# Patient Record
Sex: Female | Born: 1955 | Race: Black or African American | Hispanic: No | Marital: Married | Smoking: Never smoker
Health system: Southern US, Community
[De-identification: ages and names within clinical notes are randomized; demographics above are authoritative.]

## PROBLEM LIST (undated history)

## (undated) DIAGNOSIS — E669 Obesity, unspecified: Secondary | ICD-10-CM

## (undated) DIAGNOSIS — Z8719 Personal history of other diseases of the digestive system: Secondary | ICD-10-CM

## (undated) DIAGNOSIS — M545 Low back pain, unspecified: Secondary | ICD-10-CM

## (undated) DIAGNOSIS — J189 Pneumonia, unspecified organism: Secondary | ICD-10-CM

## (undated) DIAGNOSIS — F319 Bipolar disorder, unspecified: Secondary | ICD-10-CM

## (undated) DIAGNOSIS — D649 Anemia, unspecified: Secondary | ICD-10-CM

## (undated) DIAGNOSIS — K222 Esophageal obstruction: Secondary | ICD-10-CM

## (undated) DIAGNOSIS — M109 Gout, unspecified: Secondary | ICD-10-CM

## (undated) DIAGNOSIS — G479 Sleep disorder, unspecified: Secondary | ICD-10-CM

## (undated) DIAGNOSIS — M797 Fibromyalgia: Secondary | ICD-10-CM

## (undated) DIAGNOSIS — G8929 Other chronic pain: Secondary | ICD-10-CM

## (undated) DIAGNOSIS — I219 Acute myocardial infarction, unspecified: Secondary | ICD-10-CM

## (undated) DIAGNOSIS — J42 Unspecified chronic bronchitis: Secondary | ICD-10-CM

## (undated) DIAGNOSIS — E119 Type 2 diabetes mellitus without complications: Secondary | ICD-10-CM

## (undated) DIAGNOSIS — K589 Irritable bowel syndrome without diarrhea: Secondary | ICD-10-CM

## (undated) DIAGNOSIS — I82409 Acute embolism and thrombosis of unspecified deep veins of unspecified lower extremity: Secondary | ICD-10-CM

## (undated) DIAGNOSIS — Z9989 Dependence on other enabling machines and devices: Secondary | ICD-10-CM

## (undated) DIAGNOSIS — K635 Polyp of colon: Secondary | ICD-10-CM

## (undated) DIAGNOSIS — R519 Headache, unspecified: Secondary | ICD-10-CM

## (undated) DIAGNOSIS — K5790 Diverticulosis of intestine, part unspecified, without perforation or abscess without bleeding: Secondary | ICD-10-CM

## (undated) DIAGNOSIS — R51 Headache: Secondary | ICD-10-CM

## (undated) DIAGNOSIS — K029 Dental caries, unspecified: Secondary | ICD-10-CM

## (undated) DIAGNOSIS — T8859XA Other complications of anesthesia, initial encounter: Secondary | ICD-10-CM

## (undated) DIAGNOSIS — I209 Angina pectoris, unspecified: Secondary | ICD-10-CM

## (undated) DIAGNOSIS — R112 Nausea with vomiting, unspecified: Secondary | ICD-10-CM

## (undated) DIAGNOSIS — F419 Anxiety disorder, unspecified: Secondary | ICD-10-CM

## (undated) DIAGNOSIS — D573 Sickle-cell trait: Secondary | ICD-10-CM

## (undated) DIAGNOSIS — R718 Other abnormality of red blood cells: Secondary | ICD-10-CM

## (undated) DIAGNOSIS — I10 Essential (primary) hypertension: Secondary | ICD-10-CM

## (undated) DIAGNOSIS — F329 Major depressive disorder, single episode, unspecified: Secondary | ICD-10-CM

## (undated) DIAGNOSIS — Z9889 Other specified postprocedural states: Secondary | ICD-10-CM

## (undated) DIAGNOSIS — G4733 Obstructive sleep apnea (adult) (pediatric): Secondary | ICD-10-CM

## (undated) DIAGNOSIS — T4145XA Adverse effect of unspecified anesthetic, initial encounter: Secondary | ICD-10-CM

## (undated) DIAGNOSIS — M1990 Unspecified osteoarthritis, unspecified site: Secondary | ICD-10-CM

## (undated) DIAGNOSIS — D759 Disease of blood and blood-forming organs, unspecified: Secondary | ICD-10-CM

## (undated) DIAGNOSIS — E785 Hyperlipidemia, unspecified: Secondary | ICD-10-CM

## (undated) DIAGNOSIS — G971 Other reaction to spinal and lumbar puncture: Secondary | ICD-10-CM

## (undated) DIAGNOSIS — K219 Gastro-esophageal reflux disease without esophagitis: Secondary | ICD-10-CM

## (undated) DIAGNOSIS — R0600 Dyspnea, unspecified: Secondary | ICD-10-CM

## (undated) HISTORY — DX: Gastro-esophageal reflux disease without esophagitis: K21.9

## (undated) HISTORY — PX: ABDOMINAL HYSTERECTOMY: SHX81

## (undated) HISTORY — PX: TONSILLECTOMY: SUR1361

## (undated) HISTORY — PX: MULTIPLE TOOTH EXTRACTIONS: SHX2053

## (undated) HISTORY — PX: TUBAL LIGATION: SHX77

## (undated) HISTORY — PX: ESOPHAGOGASTRODUODENOSCOPY (EGD) WITH ESOPHAGEAL DILATION: SHX5812

## (undated) HISTORY — DX: Sleep disorder, unspecified: G47.9

## (undated) HISTORY — PX: EYE SURGERY: SHX253

## (undated) HISTORY — DX: Polyp of colon: K63.5

## (undated) HISTORY — DX: Diverticulosis of intestine, part unspecified, without perforation or abscess without bleeding: K57.90

## (undated) HISTORY — DX: Other abnormality of red blood cells: R71.8

## (undated) HISTORY — DX: Unspecified osteoarthritis, unspecified site: M19.90

## (undated) HISTORY — PX: UPPER GASTROINTESTINAL ENDOSCOPY: SHX188

## (undated) HISTORY — DX: Obesity, unspecified: E66.9

## (undated) HISTORY — DX: Irritable bowel syndrome, unspecified: K58.9

## (undated) HISTORY — DX: Major depressive disorder, single episode, unspecified: F32.9

## (undated) HISTORY — DX: Essential (primary) hypertension: I10

## (undated) HISTORY — PX: COLONOSCOPY: SHX174

## (undated) HISTORY — PX: DILATION AND CURETTAGE OF UTERUS: SHX78

## (undated) HISTORY — DX: Esophageal obstruction: K22.2

---

## 1998-03-15 ENCOUNTER — Ambulatory Visit (HOSPITAL_COMMUNITY): Admission: RE | Admit: 1998-03-15 | Discharge: 1998-03-15 | Payer: Self-pay | Admitting: Pulmonary Disease

## 1998-04-06 ENCOUNTER — Ambulatory Visit: Admission: RE | Admit: 1998-04-06 | Discharge: 1998-04-06 | Payer: Self-pay | Admitting: Pulmonary Disease

## 1998-06-09 ENCOUNTER — Ambulatory Visit (HOSPITAL_COMMUNITY): Admission: RE | Admit: 1998-06-09 | Discharge: 1998-06-09 | Payer: Self-pay | Admitting: Pulmonary Disease

## 1998-06-13 ENCOUNTER — Emergency Department (HOSPITAL_COMMUNITY): Admission: EM | Admit: 1998-06-13 | Discharge: 1998-06-13 | Payer: Self-pay | Admitting: Emergency Medicine

## 1998-07-19 ENCOUNTER — Ambulatory Visit (HOSPITAL_COMMUNITY): Admission: RE | Admit: 1998-07-19 | Discharge: 1998-07-19 | Payer: Self-pay | Admitting: Pulmonary Disease

## 1998-08-07 ENCOUNTER — Emergency Department (HOSPITAL_COMMUNITY): Admission: EM | Admit: 1998-08-07 | Discharge: 1998-08-08 | Payer: Self-pay | Admitting: Emergency Medicine

## 1998-09-27 ENCOUNTER — Ambulatory Visit (HOSPITAL_COMMUNITY): Admission: RE | Admit: 1998-09-27 | Discharge: 1998-09-27 | Payer: Self-pay | Admitting: Urology

## 1998-09-27 ENCOUNTER — Encounter: Payer: Self-pay | Admitting: Urology

## 1998-10-10 ENCOUNTER — Ambulatory Visit (HOSPITAL_COMMUNITY): Admission: RE | Admit: 1998-10-10 | Discharge: 1998-10-10 | Payer: Self-pay | Admitting: Urology

## 1999-05-08 ENCOUNTER — Inpatient Hospital Stay (HOSPITAL_COMMUNITY): Admission: EM | Admit: 1999-05-08 | Discharge: 1999-05-15 | Payer: Self-pay | Admitting: Emergency Medicine

## 1999-05-08 ENCOUNTER — Encounter: Payer: Self-pay | Admitting: Emergency Medicine

## 1999-05-09 ENCOUNTER — Encounter: Payer: Self-pay | Admitting: Pulmonary Disease

## 1999-05-11 ENCOUNTER — Encounter: Payer: Self-pay | Admitting: Cardiovascular Disease

## 1999-06-01 ENCOUNTER — Emergency Department (HOSPITAL_COMMUNITY): Admission: EM | Admit: 1999-06-01 | Discharge: 1999-06-01 | Payer: Self-pay

## 1999-07-02 ENCOUNTER — Emergency Department (HOSPITAL_COMMUNITY): Admission: EM | Admit: 1999-07-02 | Discharge: 1999-07-03 | Payer: Self-pay | Admitting: *Deleted

## 1999-08-10 ENCOUNTER — Ambulatory Visit (HOSPITAL_COMMUNITY): Admission: RE | Admit: 1999-08-10 | Discharge: 1999-08-10 | Payer: Self-pay | Admitting: Gastroenterology

## 1999-08-10 ENCOUNTER — Encounter: Payer: Self-pay | Admitting: Gastroenterology

## 1999-12-10 ENCOUNTER — Emergency Department (HOSPITAL_COMMUNITY): Admission: EM | Admit: 1999-12-10 | Discharge: 1999-12-11 | Payer: Self-pay | Admitting: Emergency Medicine

## 1999-12-18 ENCOUNTER — Ambulatory Visit (HOSPITAL_COMMUNITY): Admission: RE | Admit: 1999-12-18 | Discharge: 1999-12-18 | Payer: Self-pay | Admitting: Pulmonary Disease

## 1999-12-18 ENCOUNTER — Encounter: Payer: Self-pay | Admitting: Pulmonary Disease

## 2000-02-23 ENCOUNTER — Encounter: Admission: RE | Admit: 2000-02-23 | Discharge: 2000-02-23 | Payer: Self-pay | Admitting: Internal Medicine

## 2000-04-02 ENCOUNTER — Emergency Department (HOSPITAL_COMMUNITY): Admission: EM | Admit: 2000-04-02 | Discharge: 2000-04-02 | Payer: Self-pay | Admitting: Emergency Medicine

## 2000-04-02 ENCOUNTER — Encounter: Admission: RE | Admit: 2000-04-02 | Discharge: 2000-04-02 | Payer: Self-pay | Admitting: Internal Medicine

## 2000-05-13 ENCOUNTER — Encounter: Admission: RE | Admit: 2000-05-13 | Discharge: 2000-05-13 | Payer: Self-pay | Admitting: Internal Medicine

## 2000-05-17 ENCOUNTER — Ambulatory Visit (HOSPITAL_COMMUNITY): Admission: RE | Admit: 2000-05-17 | Discharge: 2000-05-17 | Payer: Self-pay | Admitting: *Deleted

## 2000-05-20 ENCOUNTER — Ambulatory Visit (HOSPITAL_COMMUNITY): Admission: RE | Admit: 2000-05-20 | Discharge: 2000-05-20 | Payer: Self-pay | Admitting: Unknown Physician Specialty

## 2000-05-20 ENCOUNTER — Encounter: Payer: Self-pay | Admitting: Sports Medicine

## 2000-06-06 ENCOUNTER — Encounter: Admission: RE | Admit: 2000-06-06 | Discharge: 2000-06-06 | Payer: Self-pay | Admitting: Internal Medicine

## 2000-06-12 ENCOUNTER — Encounter: Admission: RE | Admit: 2000-06-12 | Discharge: 2000-06-12 | Payer: Self-pay | Admitting: Hematology and Oncology

## 2000-06-19 ENCOUNTER — Encounter: Admission: RE | Admit: 2000-06-19 | Discharge: 2000-06-19 | Payer: Self-pay | Admitting: Hematology and Oncology

## 2000-06-28 ENCOUNTER — Encounter: Admission: RE | Admit: 2000-06-28 | Discharge: 2000-06-28 | Payer: Self-pay | Admitting: Internal Medicine

## 2000-08-15 ENCOUNTER — Encounter: Admission: RE | Admit: 2000-08-15 | Discharge: 2000-08-15 | Payer: Self-pay | Admitting: Hematology and Oncology

## 2000-08-15 ENCOUNTER — Emergency Department (HOSPITAL_COMMUNITY): Admission: EM | Admit: 2000-08-15 | Discharge: 2000-08-16 | Payer: Self-pay | Admitting: Emergency Medicine

## 2000-08-15 ENCOUNTER — Encounter: Payer: Self-pay | Admitting: Emergency Medicine

## 2000-08-22 ENCOUNTER — Encounter: Admission: RE | Admit: 2000-08-22 | Discharge: 2000-08-22 | Payer: Self-pay | Admitting: Hematology and Oncology

## 2000-09-06 ENCOUNTER — Encounter: Admission: RE | Admit: 2000-09-06 | Discharge: 2000-09-06 | Payer: Self-pay | Admitting: Internal Medicine

## 2000-09-15 ENCOUNTER — Ambulatory Visit (HOSPITAL_BASED_OUTPATIENT_CLINIC_OR_DEPARTMENT_OTHER): Admission: RE | Admit: 2000-09-15 | Discharge: 2000-09-15 | Payer: Self-pay | Admitting: *Deleted

## 2000-09-19 ENCOUNTER — Encounter: Admission: RE | Admit: 2000-09-19 | Discharge: 2000-09-19 | Payer: Self-pay | Admitting: Hematology and Oncology

## 2000-10-11 ENCOUNTER — Encounter: Admission: RE | Admit: 2000-10-11 | Discharge: 2000-10-11 | Payer: Self-pay | Admitting: Internal Medicine

## 2000-11-18 ENCOUNTER — Encounter: Admission: RE | Admit: 2000-11-18 | Discharge: 2000-11-18 | Payer: Self-pay | Admitting: Internal Medicine

## 2001-01-21 ENCOUNTER — Encounter: Admission: RE | Admit: 2001-01-21 | Discharge: 2001-01-21 | Payer: Self-pay | Admitting: Internal Medicine

## 2001-02-20 ENCOUNTER — Encounter: Payer: Self-pay | Admitting: Cardiovascular Disease

## 2001-02-20 ENCOUNTER — Ambulatory Visit (HOSPITAL_COMMUNITY): Admission: RE | Admit: 2001-02-20 | Discharge: 2001-02-20 | Payer: Self-pay | Admitting: Cardiovascular Disease

## 2001-03-10 ENCOUNTER — Emergency Department (HOSPITAL_COMMUNITY): Admission: EM | Admit: 2001-03-10 | Discharge: 2001-03-10 | Payer: Self-pay | Admitting: Emergency Medicine

## 2001-03-18 ENCOUNTER — Encounter: Admission: RE | Admit: 2001-03-18 | Discharge: 2001-03-18 | Payer: Self-pay | Admitting: Hematology and Oncology

## 2001-04-07 ENCOUNTER — Encounter: Admission: RE | Admit: 2001-04-07 | Discharge: 2001-04-07 | Payer: Self-pay | Admitting: Internal Medicine

## 2001-06-24 ENCOUNTER — Encounter: Payer: Self-pay | Admitting: Cardiovascular Disease

## 2001-06-24 ENCOUNTER — Encounter: Admission: RE | Admit: 2001-06-24 | Discharge: 2001-06-24 | Payer: Self-pay | Admitting: Cardiovascular Disease

## 2001-09-15 ENCOUNTER — Encounter: Admission: RE | Admit: 2001-09-15 | Discharge: 2001-09-15 | Payer: Self-pay | Admitting: Internal Medicine

## 2001-09-26 ENCOUNTER — Emergency Department (HOSPITAL_COMMUNITY): Admission: EM | Admit: 2001-09-26 | Discharge: 2001-09-26 | Payer: Self-pay | Admitting: Emergency Medicine

## 2001-10-01 ENCOUNTER — Encounter: Admission: RE | Admit: 2001-10-01 | Discharge: 2001-10-01 | Payer: Self-pay

## 2001-10-30 ENCOUNTER — Emergency Department (HOSPITAL_COMMUNITY): Admission: EM | Admit: 2001-10-30 | Discharge: 2001-10-30 | Payer: Self-pay | Admitting: Emergency Medicine

## 2001-10-31 ENCOUNTER — Encounter: Payer: Self-pay | Admitting: Emergency Medicine

## 2001-10-31 ENCOUNTER — Emergency Department (HOSPITAL_COMMUNITY): Admission: EM | Admit: 2001-10-31 | Discharge: 2001-10-31 | Payer: Self-pay | Admitting: Emergency Medicine

## 2001-11-17 ENCOUNTER — Encounter: Admission: RE | Admit: 2001-11-17 | Discharge: 2001-11-17 | Payer: Self-pay | Admitting: Internal Medicine

## 2001-11-20 ENCOUNTER — Encounter: Admission: RE | Admit: 2001-11-20 | Discharge: 2001-11-20 | Payer: Self-pay | Admitting: *Deleted

## 2001-12-15 ENCOUNTER — Encounter: Admission: RE | Admit: 2001-12-15 | Discharge: 2001-12-15 | Payer: Self-pay | Admitting: Internal Medicine

## 2002-01-24 ENCOUNTER — Emergency Department (HOSPITAL_COMMUNITY): Admission: EM | Admit: 2002-01-24 | Discharge: 2002-01-24 | Payer: Self-pay | Admitting: *Deleted

## 2002-01-24 ENCOUNTER — Encounter: Payer: Self-pay | Admitting: *Deleted

## 2002-03-04 ENCOUNTER — Encounter: Payer: Self-pay | Admitting: Cardiovascular Disease

## 2002-03-04 ENCOUNTER — Encounter: Admission: RE | Admit: 2002-03-04 | Discharge: 2002-03-04 | Payer: Self-pay | Admitting: Cardiovascular Disease

## 2002-03-13 ENCOUNTER — Encounter: Payer: Self-pay | Admitting: Internal Medicine

## 2002-03-13 ENCOUNTER — Ambulatory Visit (HOSPITAL_COMMUNITY): Admission: RE | Admit: 2002-03-13 | Discharge: 2002-03-13 | Payer: Self-pay | Admitting: Internal Medicine

## 2002-03-13 ENCOUNTER — Encounter: Admission: RE | Admit: 2002-03-13 | Discharge: 2002-03-13 | Payer: Self-pay | Admitting: Internal Medicine

## 2002-04-23 ENCOUNTER — Emergency Department (HOSPITAL_COMMUNITY): Admission: EM | Admit: 2002-04-23 | Discharge: 2002-04-23 | Payer: Self-pay | Admitting: Internal Medicine

## 2002-05-12 ENCOUNTER — Emergency Department (HOSPITAL_COMMUNITY): Admission: EM | Admit: 2002-05-12 | Discharge: 2002-05-13 | Payer: Self-pay | Admitting: Emergency Medicine

## 2002-05-14 ENCOUNTER — Encounter: Admission: RE | Admit: 2002-05-14 | Discharge: 2002-05-14 | Payer: Self-pay | Admitting: Internal Medicine

## 2002-05-28 ENCOUNTER — Ambulatory Visit (HOSPITAL_COMMUNITY): Admission: RE | Admit: 2002-05-28 | Discharge: 2002-05-28 | Payer: Self-pay | Admitting: Internal Medicine

## 2002-05-28 ENCOUNTER — Encounter: Payer: Self-pay | Admitting: Internal Medicine

## 2002-05-28 ENCOUNTER — Encounter: Admission: RE | Admit: 2002-05-28 | Discharge: 2002-05-28 | Payer: Self-pay | Admitting: Internal Medicine

## 2002-06-03 ENCOUNTER — Encounter: Admission: RE | Admit: 2002-06-03 | Discharge: 2002-06-03 | Payer: Self-pay | Admitting: Internal Medicine

## 2002-06-10 ENCOUNTER — Emergency Department (HOSPITAL_COMMUNITY): Admission: EM | Admit: 2002-06-10 | Discharge: 2002-06-10 | Payer: Self-pay | Admitting: Emergency Medicine

## 2002-06-18 ENCOUNTER — Encounter: Admission: RE | Admit: 2002-06-18 | Discharge: 2002-06-18 | Payer: Self-pay | Admitting: Internal Medicine

## 2002-06-23 ENCOUNTER — Emergency Department (HOSPITAL_COMMUNITY): Admission: EM | Admit: 2002-06-23 | Discharge: 2002-06-23 | Payer: Self-pay | Admitting: Emergency Medicine

## 2002-06-24 ENCOUNTER — Encounter: Admission: RE | Admit: 2002-06-24 | Discharge: 2002-06-24 | Payer: Self-pay | Admitting: Internal Medicine

## 2002-07-24 ENCOUNTER — Encounter: Admission: RE | Admit: 2002-07-24 | Discharge: 2002-07-24 | Payer: Self-pay | Admitting: Internal Medicine

## 2002-07-28 ENCOUNTER — Ambulatory Visit (HOSPITAL_COMMUNITY): Admission: RE | Admit: 2002-07-28 | Discharge: 2002-07-28 | Payer: Self-pay

## 2002-08-04 ENCOUNTER — Encounter: Admission: RE | Admit: 2002-08-04 | Discharge: 2002-08-04 | Payer: Self-pay | Admitting: Internal Medicine

## 2002-08-04 ENCOUNTER — Encounter: Payer: Self-pay | Admitting: Internal Medicine

## 2002-08-04 ENCOUNTER — Ambulatory Visit (HOSPITAL_COMMUNITY): Admission: RE | Admit: 2002-08-04 | Discharge: 2002-08-04 | Payer: Self-pay | Admitting: Internal Medicine

## 2002-10-15 ENCOUNTER — Encounter: Admission: RE | Admit: 2002-10-15 | Discharge: 2002-10-15 | Payer: Self-pay | Admitting: Internal Medicine

## 2002-10-20 ENCOUNTER — Emergency Department (HOSPITAL_COMMUNITY): Admission: EM | Admit: 2002-10-20 | Discharge: 2002-10-20 | Payer: Self-pay | Admitting: Emergency Medicine

## 2002-11-12 ENCOUNTER — Encounter: Admission: RE | Admit: 2002-11-12 | Discharge: 2002-11-12 | Payer: Self-pay | Admitting: Internal Medicine

## 2002-11-18 ENCOUNTER — Emergency Department (HOSPITAL_COMMUNITY): Admission: EM | Admit: 2002-11-18 | Discharge: 2002-11-18 | Payer: Self-pay | Admitting: Emergency Medicine

## 2002-11-24 ENCOUNTER — Encounter: Admission: RE | Admit: 2002-11-24 | Discharge: 2002-11-24 | Payer: Self-pay | Admitting: Sports Medicine

## 2002-11-24 ENCOUNTER — Encounter: Payer: Self-pay | Admitting: Sports Medicine

## 2002-12-15 ENCOUNTER — Encounter: Admission: RE | Admit: 2002-12-15 | Discharge: 2002-12-15 | Payer: Self-pay | Admitting: Internal Medicine

## 2003-02-23 ENCOUNTER — Encounter: Admission: RE | Admit: 2003-02-23 | Discharge: 2003-02-23 | Payer: Self-pay | Admitting: Internal Medicine

## 2003-03-01 ENCOUNTER — Emergency Department (HOSPITAL_COMMUNITY): Admission: EM | Admit: 2003-03-01 | Discharge: 2003-03-01 | Payer: Self-pay | Admitting: Emergency Medicine

## 2003-03-01 ENCOUNTER — Encounter: Payer: Self-pay | Admitting: Emergency Medicine

## 2003-04-21 ENCOUNTER — Encounter: Admission: RE | Admit: 2003-04-21 | Discharge: 2003-04-21 | Payer: Self-pay | Admitting: Internal Medicine

## 2003-04-27 ENCOUNTER — Encounter: Admission: RE | Admit: 2003-04-27 | Discharge: 2003-04-27 | Payer: Self-pay | Admitting: Internal Medicine

## 2003-06-01 ENCOUNTER — Encounter: Admission: RE | Admit: 2003-06-01 | Discharge: 2003-06-01 | Payer: Self-pay | Admitting: Internal Medicine

## 2003-07-20 ENCOUNTER — Encounter: Admission: RE | Admit: 2003-07-20 | Discharge: 2003-07-20 | Payer: Self-pay | Admitting: Internal Medicine

## 2003-07-22 ENCOUNTER — Encounter: Admission: RE | Admit: 2003-07-22 | Discharge: 2003-07-22 | Payer: Self-pay | Admitting: Internal Medicine

## 2003-08-15 ENCOUNTER — Emergency Department (HOSPITAL_COMMUNITY): Admission: EM | Admit: 2003-08-15 | Discharge: 2003-08-15 | Payer: Self-pay | Admitting: Emergency Medicine

## 2003-08-15 ENCOUNTER — Encounter: Payer: Self-pay | Admitting: Emergency Medicine

## 2003-11-16 ENCOUNTER — Ambulatory Visit (HOSPITAL_COMMUNITY): Admission: RE | Admit: 2003-11-16 | Discharge: 2003-11-16 | Payer: Self-pay | Admitting: Internal Medicine

## 2003-11-16 ENCOUNTER — Encounter: Admission: RE | Admit: 2003-11-16 | Discharge: 2003-11-16 | Payer: Self-pay | Admitting: Internal Medicine

## 2003-11-17 ENCOUNTER — Emergency Department (HOSPITAL_COMMUNITY): Admission: EM | Admit: 2003-11-17 | Discharge: 2003-11-18 | Payer: Self-pay | Admitting: Emergency Medicine

## 2003-11-23 ENCOUNTER — Encounter: Admission: RE | Admit: 2003-11-23 | Discharge: 2003-11-23 | Payer: Self-pay | Admitting: Internal Medicine

## 2003-11-29 ENCOUNTER — Encounter: Admission: RE | Admit: 2003-11-29 | Discharge: 2004-02-27 | Payer: Self-pay | Admitting: Internal Medicine

## 2003-12-10 ENCOUNTER — Encounter: Admission: RE | Admit: 2003-12-10 | Discharge: 2003-12-10 | Payer: Self-pay | Admitting: Internal Medicine

## 2004-01-13 ENCOUNTER — Encounter: Admission: RE | Admit: 2004-01-13 | Discharge: 2004-01-13 | Payer: Self-pay | Admitting: Internal Medicine

## 2004-01-17 ENCOUNTER — Ambulatory Visit (HOSPITAL_COMMUNITY): Admission: RE | Admit: 2004-01-17 | Discharge: 2004-01-17 | Payer: Self-pay

## 2004-01-31 ENCOUNTER — Encounter: Admission: RE | Admit: 2004-01-31 | Discharge: 2004-01-31 | Payer: Self-pay | Admitting: Internal Medicine

## 2004-02-11 ENCOUNTER — Encounter: Admission: RE | Admit: 2004-02-11 | Discharge: 2004-02-11 | Payer: Self-pay | Admitting: Internal Medicine

## 2004-08-01 ENCOUNTER — Emergency Department (HOSPITAL_COMMUNITY): Admission: EM | Admit: 2004-08-01 | Discharge: 2004-08-02 | Payer: Self-pay | Admitting: *Deleted

## 2004-08-07 ENCOUNTER — Encounter: Admission: RE | Admit: 2004-08-07 | Discharge: 2004-08-07 | Payer: Self-pay | Admitting: Cardiovascular Disease

## 2004-08-28 ENCOUNTER — Ambulatory Visit (HOSPITAL_COMMUNITY): Admission: RE | Admit: 2004-08-28 | Discharge: 2004-08-28 | Payer: Self-pay | Admitting: Internal Medicine

## 2004-08-28 ENCOUNTER — Ambulatory Visit: Payer: Self-pay | Admitting: Internal Medicine

## 2004-08-31 ENCOUNTER — Ambulatory Visit: Payer: Self-pay | Admitting: Internal Medicine

## 2004-09-06 ENCOUNTER — Ambulatory Visit: Payer: Self-pay | Admitting: Internal Medicine

## 2004-10-02 ENCOUNTER — Ambulatory Visit: Payer: Self-pay | Admitting: Internal Medicine

## 2004-11-07 ENCOUNTER — Ambulatory Visit: Payer: Self-pay | Admitting: Internal Medicine

## 2005-01-31 ENCOUNTER — Ambulatory Visit (HOSPITAL_COMMUNITY): Admission: RE | Admit: 2005-01-31 | Discharge: 2005-01-31 | Payer: Self-pay | Admitting: Internal Medicine

## 2005-01-31 ENCOUNTER — Ambulatory Visit: Payer: Self-pay | Admitting: Internal Medicine

## 2005-02-22 ENCOUNTER — Ambulatory Visit (HOSPITAL_COMMUNITY): Admission: RE | Admit: 2005-02-22 | Discharge: 2005-02-22 | Payer: Self-pay | Admitting: Cardiovascular Disease

## 2005-03-22 ENCOUNTER — Ambulatory Visit: Payer: Self-pay | Admitting: Internal Medicine

## 2005-03-23 ENCOUNTER — Ambulatory Visit (HOSPITAL_COMMUNITY): Admission: RE | Admit: 2005-03-23 | Discharge: 2005-03-23 | Payer: Self-pay | Admitting: Internal Medicine

## 2005-03-28 ENCOUNTER — Ambulatory Visit (HOSPITAL_COMMUNITY): Admission: RE | Admit: 2005-03-28 | Discharge: 2005-03-28 | Payer: Self-pay | Admitting: Internal Medicine

## 2005-04-16 ENCOUNTER — Ambulatory Visit: Payer: Self-pay | Admitting: Internal Medicine

## 2005-06-08 ENCOUNTER — Inpatient Hospital Stay (HOSPITAL_COMMUNITY): Admission: EM | Admit: 2005-06-08 | Discharge: 2005-06-12 | Payer: Self-pay | Admitting: Emergency Medicine

## 2005-06-08 ENCOUNTER — Ambulatory Visit: Payer: Self-pay | Admitting: Internal Medicine

## 2005-06-22 ENCOUNTER — Ambulatory Visit: Payer: Self-pay | Admitting: Internal Medicine

## 2005-06-25 ENCOUNTER — Ambulatory Visit: Payer: Self-pay | Admitting: Gastroenterology

## 2005-07-05 ENCOUNTER — Ambulatory Visit: Payer: Self-pay | Admitting: Gastroenterology

## 2005-07-11 ENCOUNTER — Encounter (INDEPENDENT_AMBULATORY_CARE_PROVIDER_SITE_OTHER): Payer: Self-pay | Admitting: Specialist

## 2005-07-11 ENCOUNTER — Ambulatory Visit: Payer: Self-pay | Admitting: Gastroenterology

## 2005-07-24 ENCOUNTER — Ambulatory Visit: Payer: Self-pay | Admitting: Internal Medicine

## 2005-07-27 ENCOUNTER — Emergency Department (HOSPITAL_COMMUNITY): Admission: EM | Admit: 2005-07-27 | Discharge: 2005-07-27 | Payer: Self-pay | Admitting: Family Medicine

## 2005-08-08 ENCOUNTER — Ambulatory Visit: Payer: Self-pay | Admitting: Hospitalist

## 2005-08-13 ENCOUNTER — Ambulatory Visit: Payer: Self-pay | Admitting: Gastroenterology

## 2005-08-17 ENCOUNTER — Emergency Department (HOSPITAL_COMMUNITY): Admission: EM | Admit: 2005-08-17 | Discharge: 2005-08-17 | Payer: Self-pay | Admitting: Emergency Medicine

## 2005-08-23 ENCOUNTER — Ambulatory Visit (HOSPITAL_COMMUNITY): Admission: RE | Admit: 2005-08-23 | Discharge: 2005-08-23 | Payer: Self-pay | Admitting: Gastroenterology

## 2005-08-23 ENCOUNTER — Ambulatory Visit: Payer: Self-pay | Admitting: Gastroenterology

## 2005-08-31 ENCOUNTER — Ambulatory Visit: Payer: Self-pay | Admitting: Gastroenterology

## 2005-09-05 ENCOUNTER — Emergency Department (HOSPITAL_COMMUNITY): Admission: EM | Admit: 2005-09-05 | Discharge: 2005-09-06 | Payer: Self-pay | Admitting: Emergency Medicine

## 2005-09-07 ENCOUNTER — Ambulatory Visit: Payer: Self-pay | Admitting: Gastroenterology

## 2005-09-07 ENCOUNTER — Encounter (INDEPENDENT_AMBULATORY_CARE_PROVIDER_SITE_OTHER): Payer: Self-pay | Admitting: *Deleted

## 2005-09-20 ENCOUNTER — Ambulatory Visit: Payer: Self-pay | Admitting: Internal Medicine

## 2005-10-03 ENCOUNTER — Ambulatory Visit (HOSPITAL_COMMUNITY): Admission: RE | Admit: 2005-10-03 | Discharge: 2005-10-03 | Payer: Self-pay | Admitting: Gastroenterology

## 2005-10-03 ENCOUNTER — Ambulatory Visit: Payer: Self-pay | Admitting: Gastroenterology

## 2005-10-05 ENCOUNTER — Ambulatory Visit: Payer: Self-pay | Admitting: Gastroenterology

## 2005-10-11 ENCOUNTER — Ambulatory Visit: Payer: Self-pay | Admitting: Internal Medicine

## 2005-10-11 ENCOUNTER — Ambulatory Visit (HOSPITAL_COMMUNITY): Admission: RE | Admit: 2005-10-11 | Discharge: 2005-10-11 | Payer: Self-pay | Admitting: Internal Medicine

## 2005-10-18 ENCOUNTER — Ambulatory Visit: Payer: Self-pay | Admitting: Internal Medicine

## 2005-11-01 ENCOUNTER — Ambulatory Visit: Payer: Self-pay | Admitting: Internal Medicine

## 2005-11-11 ENCOUNTER — Emergency Department (HOSPITAL_COMMUNITY): Admission: EM | Admit: 2005-11-11 | Discharge: 2005-11-11 | Payer: Self-pay | Admitting: Emergency Medicine

## 2005-11-12 ENCOUNTER — Ambulatory Visit: Payer: Self-pay | Admitting: Internal Medicine

## 2005-11-26 ENCOUNTER — Ambulatory Visit: Payer: Self-pay | Admitting: Cardiology

## 2005-11-30 ENCOUNTER — Ambulatory Visit: Payer: Self-pay

## 2005-12-03 ENCOUNTER — Ambulatory Visit: Payer: Self-pay

## 2005-12-13 ENCOUNTER — Ambulatory Visit: Payer: Self-pay | Admitting: Internal Medicine

## 2005-12-14 ENCOUNTER — Ambulatory Visit (HOSPITAL_COMMUNITY): Admission: RE | Admit: 2005-12-14 | Discharge: 2005-12-14 | Payer: Self-pay | Admitting: Internal Medicine

## 2006-01-21 ENCOUNTER — Ambulatory Visit (HOSPITAL_BASED_OUTPATIENT_CLINIC_OR_DEPARTMENT_OTHER): Admission: RE | Admit: 2006-01-21 | Discharge: 2006-01-21 | Payer: Self-pay | Admitting: *Deleted

## 2006-01-21 ENCOUNTER — Ambulatory Visit: Payer: Self-pay | Admitting: Internal Medicine

## 2006-01-27 ENCOUNTER — Ambulatory Visit: Payer: Self-pay | Admitting: Internal Medicine

## 2006-02-21 ENCOUNTER — Ambulatory Visit: Payer: Self-pay | Admitting: Internal Medicine

## 2006-03-06 ENCOUNTER — Ambulatory Visit (HOSPITAL_COMMUNITY): Admission: RE | Admit: 2006-03-06 | Discharge: 2006-03-06 | Payer: Self-pay | Admitting: Internal Medicine

## 2006-03-06 ENCOUNTER — Encounter: Payer: Self-pay | Admitting: Pulmonary Disease

## 2006-03-06 ENCOUNTER — Ambulatory Visit: Payer: Self-pay | Admitting: Internal Medicine

## 2006-04-23 ENCOUNTER — Emergency Department (HOSPITAL_COMMUNITY): Admission: EM | Admit: 2006-04-23 | Discharge: 2006-04-23 | Payer: Self-pay | Admitting: Family Medicine

## 2006-04-26 ENCOUNTER — Ambulatory Visit (HOSPITAL_COMMUNITY): Admission: RE | Admit: 2006-04-26 | Discharge: 2006-04-26 | Payer: Self-pay | Admitting: Internal Medicine

## 2006-04-26 ENCOUNTER — Ambulatory Visit: Payer: Self-pay | Admitting: Internal Medicine

## 2006-05-05 ENCOUNTER — Emergency Department (HOSPITAL_COMMUNITY): Admission: EM | Admit: 2006-05-05 | Discharge: 2006-05-05 | Payer: Self-pay | Admitting: Family Medicine

## 2006-06-12 ENCOUNTER — Encounter: Admission: RE | Admit: 2006-06-12 | Discharge: 2006-06-12 | Payer: Self-pay | Admitting: Internal Medicine

## 2006-07-02 ENCOUNTER — Ambulatory Visit: Payer: Self-pay | Admitting: *Deleted

## 2006-07-02 ENCOUNTER — Ambulatory Visit (HOSPITAL_COMMUNITY): Admission: RE | Admit: 2006-07-02 | Discharge: 2006-07-02 | Payer: Self-pay | Admitting: Hospitalist

## 2006-07-07 ENCOUNTER — Emergency Department (HOSPITAL_COMMUNITY): Admission: EM | Admit: 2006-07-07 | Discharge: 2006-07-07 | Payer: Self-pay | Admitting: Emergency Medicine

## 2006-07-21 ENCOUNTER — Inpatient Hospital Stay (HOSPITAL_COMMUNITY): Admission: EM | Admit: 2006-07-21 | Discharge: 2006-07-24 | Payer: Self-pay | Admitting: Emergency Medicine

## 2006-07-21 ENCOUNTER — Ambulatory Visit: Payer: Self-pay | Admitting: Internal Medicine

## 2006-07-22 ENCOUNTER — Encounter: Payer: Self-pay | Admitting: Vascular Surgery

## 2006-07-30 ENCOUNTER — Ambulatory Visit: Payer: Self-pay | Admitting: Internal Medicine

## 2006-08-07 ENCOUNTER — Ambulatory Visit: Payer: Self-pay | Admitting: Internal Medicine

## 2006-08-07 ENCOUNTER — Ambulatory Visit (HOSPITAL_COMMUNITY): Admission: RE | Admit: 2006-08-07 | Discharge: 2006-08-07 | Payer: Self-pay | Admitting: Internal Medicine

## 2006-08-14 ENCOUNTER — Encounter (INDEPENDENT_AMBULATORY_CARE_PROVIDER_SITE_OTHER): Payer: Self-pay | Admitting: Internal Medicine

## 2006-08-14 ENCOUNTER — Ambulatory Visit: Payer: Self-pay | Admitting: Internal Medicine

## 2006-08-14 LAB — CONVERTED CEMR LAB
Benzodiazepines.: NEGATIVE
Creatinine,U: 264.4 mg/dL
Opiate Screen, Urine: NEGATIVE
Phencyclidine (PCP): NEGATIVE
Propoxyphene: NEGATIVE
Retic Count, Absolute: 83.2 (ref 19.0–186.0)
Retic Ct Pct: 1.6 % (ref 0.4–3.1)

## 2006-08-22 ENCOUNTER — Emergency Department (HOSPITAL_COMMUNITY): Admission: EM | Admit: 2006-08-22 | Discharge: 2006-08-22 | Payer: Self-pay | Admitting: Emergency Medicine

## 2006-08-28 ENCOUNTER — Emergency Department (HOSPITAL_COMMUNITY): Admission: EM | Admit: 2006-08-28 | Discharge: 2006-08-28 | Payer: Self-pay | Admitting: Emergency Medicine

## 2006-09-02 ENCOUNTER — Ambulatory Visit: Payer: Self-pay | Admitting: Internal Medicine

## 2006-09-05 DIAGNOSIS — F32A Depression, unspecified: Secondary | ICD-10-CM

## 2006-09-05 HISTORY — DX: Depression, unspecified: F32.A

## 2006-09-08 ENCOUNTER — Ambulatory Visit: Payer: Self-pay | Admitting: Psychiatry

## 2006-09-08 ENCOUNTER — Inpatient Hospital Stay (HOSPITAL_COMMUNITY): Admission: EM | Admit: 2006-09-08 | Discharge: 2006-09-13 | Payer: Self-pay | Admitting: Psychiatry

## 2006-09-12 DIAGNOSIS — K219 Gastro-esophageal reflux disease without esophagitis: Secondary | ICD-10-CM | POA: Insufficient documentation

## 2006-09-12 DIAGNOSIS — E119 Type 2 diabetes mellitus without complications: Secondary | ICD-10-CM | POA: Insufficient documentation

## 2006-09-12 DIAGNOSIS — R718 Other abnormality of red blood cells: Secondary | ICD-10-CM

## 2006-09-12 DIAGNOSIS — E1165 Type 2 diabetes mellitus with hyperglycemia: Secondary | ICD-10-CM

## 2006-09-12 DIAGNOSIS — J45909 Unspecified asthma, uncomplicated: Secondary | ICD-10-CM

## 2006-09-12 DIAGNOSIS — E66813 Obesity, class 3: Secondary | ICD-10-CM | POA: Insufficient documentation

## 2006-09-12 DIAGNOSIS — I10 Essential (primary) hypertension: Secondary | ICD-10-CM

## 2006-09-12 DIAGNOSIS — M1990 Unspecified osteoarthritis, unspecified site: Secondary | ICD-10-CM | POA: Insufficient documentation

## 2006-09-12 DIAGNOSIS — Z9079 Acquired absence of other genital organ(s): Secondary | ICD-10-CM | POA: Insufficient documentation

## 2006-09-12 DIAGNOSIS — D126 Benign neoplasm of colon, unspecified: Secondary | ICD-10-CM | POA: Insufficient documentation

## 2006-09-16 ENCOUNTER — Emergency Department (HOSPITAL_COMMUNITY): Admission: EM | Admit: 2006-09-16 | Discharge: 2006-09-16 | Payer: Self-pay | Admitting: Emergency Medicine

## 2006-09-19 ENCOUNTER — Ambulatory Visit: Payer: Self-pay | Admitting: Hospitalist

## 2006-09-19 ENCOUNTER — Ambulatory Visit (HOSPITAL_COMMUNITY): Admission: RE | Admit: 2006-09-19 | Discharge: 2006-09-19 | Payer: Self-pay | Admitting: Hospitalist

## 2006-09-24 ENCOUNTER — Ambulatory Visit: Payer: Self-pay | Admitting: Internal Medicine

## 2006-09-30 ENCOUNTER — Encounter (INDEPENDENT_AMBULATORY_CARE_PROVIDER_SITE_OTHER): Payer: Self-pay | Admitting: *Deleted

## 2006-10-02 ENCOUNTER — Ambulatory Visit: Payer: Self-pay | Admitting: Hospitalist

## 2006-10-02 ENCOUNTER — Encounter (INDEPENDENT_AMBULATORY_CARE_PROVIDER_SITE_OTHER): Payer: Self-pay | Admitting: Internal Medicine

## 2006-10-02 LAB — CONVERTED CEMR LAB
Chloride: 97 meq/L (ref 96–112)
Creatinine, Ser: 0.99 mg/dL (ref 0.40–1.20)
Sodium: 133 meq/L — ABNORMAL LOW (ref 135–145)

## 2006-10-24 ENCOUNTER — Ambulatory Visit (HOSPITAL_COMMUNITY): Admission: RE | Admit: 2006-10-24 | Discharge: 2006-10-24 | Payer: Self-pay | Admitting: Internal Medicine

## 2006-10-24 ENCOUNTER — Ambulatory Visit: Payer: Self-pay | Admitting: *Deleted

## 2006-10-25 ENCOUNTER — Ambulatory Visit: Payer: Self-pay | Admitting: Internal Medicine

## 2006-10-25 ENCOUNTER — Encounter (INDEPENDENT_AMBULATORY_CARE_PROVIDER_SITE_OTHER): Payer: Self-pay | Admitting: *Deleted

## 2006-11-07 ENCOUNTER — Ambulatory Visit: Payer: Self-pay | Admitting: Hospitalist

## 2006-11-12 ENCOUNTER — Encounter (INDEPENDENT_AMBULATORY_CARE_PROVIDER_SITE_OTHER): Payer: Self-pay | Admitting: *Deleted

## 2006-11-16 ENCOUNTER — Emergency Department (HOSPITAL_COMMUNITY): Admission: EM | Admit: 2006-11-16 | Discharge: 2006-11-16 | Payer: Self-pay | Admitting: Family Medicine

## 2006-11-20 ENCOUNTER — Encounter: Payer: Self-pay | Admitting: Internal Medicine

## 2006-12-03 ENCOUNTER — Ambulatory Visit: Payer: Self-pay | Admitting: Internal Medicine

## 2006-12-03 ENCOUNTER — Encounter (INDEPENDENT_AMBULATORY_CARE_PROVIDER_SITE_OTHER): Payer: Self-pay | Admitting: Internal Medicine

## 2006-12-03 DIAGNOSIS — G4700 Insomnia, unspecified: Secondary | ICD-10-CM

## 2006-12-03 LAB — CONVERTED CEMR LAB
Hemoglobin, Urine: NEGATIVE
Hgb A1c MFr Bld: 9.4 %
Leukocytes, UA: NEGATIVE
Nitrite: NEGATIVE
Protein, ur: NEGATIVE mg/dL
pH: 6 (ref 5.0–8.0)

## 2006-12-04 ENCOUNTER — Ambulatory Visit: Payer: Self-pay | Admitting: Internal Medicine

## 2006-12-11 ENCOUNTER — Telehealth: Payer: Self-pay | Admitting: *Deleted

## 2006-12-16 ENCOUNTER — Ambulatory Visit: Payer: Self-pay | Admitting: Internal Medicine

## 2006-12-16 ENCOUNTER — Encounter (INDEPENDENT_AMBULATORY_CARE_PROVIDER_SITE_OTHER): Payer: Self-pay | Admitting: Unknown Physician Specialty

## 2006-12-17 ENCOUNTER — Encounter (INDEPENDENT_AMBULATORY_CARE_PROVIDER_SITE_OTHER): Payer: Self-pay | Admitting: *Deleted

## 2006-12-23 ENCOUNTER — Ambulatory Visit: Payer: Self-pay | Admitting: Gastroenterology

## 2006-12-23 LAB — CONVERTED CEMR LAB
ALT: 14 units/L (ref 0–40)
AST: 10 units/L (ref 0–37)
Albumin: 3.8 g/dL (ref 3.5–5.2)
BUN: 15 mg/dL (ref 6–23)
Basophils Absolute: 0 10*3/uL (ref 0.0–0.1)
Calcium: 9 mg/dL (ref 8.4–10.5)
Chloride: 102 meq/L (ref 96–112)
Eosinophils Absolute: 0 10*3/uL (ref 0.0–0.6)
GFR calc non Af Amer: 80 mL/min
MCHC: 33.1 g/dL (ref 30.0–36.0)
MCV: 68.5 fL — ABNORMAL LOW (ref 78.0–100.0)
Platelets: 312 10*3/uL (ref 150–400)
RBC: 4.74 M/uL (ref 3.87–5.11)
TSH: 0.54 microintl units/mL (ref 0.35–5.50)
WBC: 6.7 10*3/uL (ref 4.5–10.5)

## 2006-12-24 ENCOUNTER — Ambulatory Visit: Payer: Self-pay | Admitting: Gastroenterology

## 2006-12-24 DIAGNOSIS — K5730 Diverticulosis of large intestine without perforation or abscess without bleeding: Secondary | ICD-10-CM | POA: Insufficient documentation

## 2007-01-03 ENCOUNTER — Telehealth: Payer: Self-pay | Admitting: *Deleted

## 2007-01-14 ENCOUNTER — Ambulatory Visit: Payer: Self-pay | Admitting: *Deleted

## 2007-01-14 DIAGNOSIS — E785 Hyperlipidemia, unspecified: Secondary | ICD-10-CM

## 2007-01-14 LAB — CONVERTED CEMR LAB
Amphetamine Screen, Ur: NEGATIVE
Barbiturate Quant, Ur: NEGATIVE
Benzodiazepines.: NEGATIVE
Cocaine Metabolites: NEGATIVE
Creatinine, Urine: 138.4 mg/dL
Creatinine,U: 135 mg/dL
Marijuana Metabolite: NEGATIVE
Methadone: NEGATIVE
Microalb Creat Ratio: 5.3 mg/g (ref 0.0–30.0)
Microalb, Ur: 0.74 mg/dL (ref 0.00–1.89)
Opiates: NEGATIVE
Phencyclidine (PCP): NEGATIVE
Propoxyphene: NEGATIVE

## 2007-01-15 ENCOUNTER — Ambulatory Visit: Payer: Self-pay | Admitting: *Deleted

## 2007-01-15 LAB — CONVERTED CEMR LAB
Cholesterol: 140 mg/dL (ref 0–200)
Total CHOL/HDL Ratio: 2.5

## 2007-01-16 ENCOUNTER — Encounter (INDEPENDENT_AMBULATORY_CARE_PROVIDER_SITE_OTHER): Payer: Self-pay | Admitting: *Deleted

## 2007-01-17 ENCOUNTER — Ambulatory Visit (HOSPITAL_COMMUNITY): Admission: RE | Admit: 2007-01-17 | Discharge: 2007-01-17 | Payer: Self-pay | Admitting: *Deleted

## 2007-01-17 ENCOUNTER — Encounter (INDEPENDENT_AMBULATORY_CARE_PROVIDER_SITE_OTHER): Payer: Self-pay | Admitting: Specialist

## 2007-01-17 ENCOUNTER — Ambulatory Visit (HOSPITAL_COMMUNITY): Admission: RE | Admit: 2007-01-17 | Discharge: 2007-01-17 | Payer: Self-pay | Admitting: Family Medicine

## 2007-01-17 ENCOUNTER — Encounter: Payer: Self-pay | Admitting: Gastroenterology

## 2007-01-17 DIAGNOSIS — D139 Benign neoplasm of ill-defined sites within the digestive system: Secondary | ICD-10-CM

## 2007-01-17 DIAGNOSIS — K222 Esophageal obstruction: Secondary | ICD-10-CM

## 2007-01-17 DIAGNOSIS — D1399 Benign neoplasm of ill-defined sites within the digestive system: Secondary | ICD-10-CM | POA: Insufficient documentation

## 2007-01-24 ENCOUNTER — Ambulatory Visit: Payer: Self-pay | Admitting: Gastroenterology

## 2007-01-27 ENCOUNTER — Ambulatory Visit: Payer: Self-pay | Admitting: Gastroenterology

## 2007-02-04 ENCOUNTER — Telehealth (INDEPENDENT_AMBULATORY_CARE_PROVIDER_SITE_OTHER): Payer: Self-pay | Admitting: *Deleted

## 2007-02-06 ENCOUNTER — Ambulatory Visit (HOSPITAL_COMMUNITY): Admission: RE | Admit: 2007-02-06 | Discharge: 2007-02-06 | Payer: Self-pay | Admitting: Ophthalmology

## 2007-02-14 ENCOUNTER — Telehealth: Payer: Self-pay | Admitting: *Deleted

## 2007-02-21 ENCOUNTER — Telehealth (INDEPENDENT_AMBULATORY_CARE_PROVIDER_SITE_OTHER): Payer: Self-pay | Admitting: *Deleted

## 2007-02-25 ENCOUNTER — Ambulatory Visit (HOSPITAL_COMMUNITY): Admission: RE | Admit: 2007-02-25 | Discharge: 2007-02-25 | Payer: Self-pay | Admitting: Internal Medicine

## 2007-02-25 ENCOUNTER — Encounter (INDEPENDENT_AMBULATORY_CARE_PROVIDER_SITE_OTHER): Payer: Self-pay | Admitting: Internal Medicine

## 2007-02-25 ENCOUNTER — Ambulatory Visit: Payer: Self-pay | Admitting: Internal Medicine

## 2007-02-25 LAB — CONVERTED CEMR LAB: Hgb A1c MFr Bld: 7 %

## 2007-03-04 ENCOUNTER — Ambulatory Visit: Payer: Self-pay | Admitting: *Deleted

## 2007-03-04 DIAGNOSIS — M79609 Pain in unspecified limb: Secondary | ICD-10-CM | POA: Insufficient documentation

## 2007-03-04 DIAGNOSIS — F411 Generalized anxiety disorder: Secondary | ICD-10-CM | POA: Insufficient documentation

## 2007-03-04 LAB — CONVERTED CEMR LAB
ALT: 10 units/L (ref 0–35)
AST: 6 units/L (ref 0–37)
Albumin: 4.6 g/dL (ref 3.5–5.2)
BUN: 11 mg/dL (ref 6–23)
Barbiturate Quant, Ur: NEGATIVE
Calcium: 9.3 mg/dL (ref 8.4–10.5)
Chloride: 98 meq/L (ref 96–112)
Creatinine,U: 114.2 mg/dL
Methadone: NEGATIVE
Opiates: NEGATIVE
Phencyclidine (PCP): NEGATIVE
Potassium: 4.2 meq/L (ref 3.5–5.3)
Propoxyphene: NEGATIVE
Sodium: 138 meq/L (ref 135–145)
Total Protein: 7.9 g/dL (ref 6.0–8.3)

## 2007-03-05 ENCOUNTER — Encounter (INDEPENDENT_AMBULATORY_CARE_PROVIDER_SITE_OTHER): Payer: Self-pay | Admitting: *Deleted

## 2007-03-07 ENCOUNTER — Telehealth (INDEPENDENT_AMBULATORY_CARE_PROVIDER_SITE_OTHER): Payer: Self-pay | Admitting: *Deleted

## 2007-03-12 ENCOUNTER — Encounter: Payer: Self-pay | Admitting: Vascular Surgery

## 2007-03-12 ENCOUNTER — Ambulatory Visit (HOSPITAL_COMMUNITY): Admission: RE | Admit: 2007-03-12 | Discharge: 2007-03-12 | Payer: Self-pay | Admitting: *Deleted

## 2007-03-12 ENCOUNTER — Ambulatory Visit: Payer: Self-pay | Admitting: Vascular Surgery

## 2007-03-17 ENCOUNTER — Telehealth: Payer: Self-pay | Admitting: *Deleted

## 2007-03-24 ENCOUNTER — Ambulatory Visit: Payer: Self-pay | Admitting: Internal Medicine

## 2007-03-24 DIAGNOSIS — J0100 Acute maxillary sinusitis, unspecified: Secondary | ICD-10-CM

## 2007-04-08 ENCOUNTER — Telehealth (INDEPENDENT_AMBULATORY_CARE_PROVIDER_SITE_OTHER): Payer: Self-pay | Admitting: *Deleted

## 2007-04-14 ENCOUNTER — Telehealth: Payer: Self-pay | Admitting: *Deleted

## 2007-04-14 DIAGNOSIS — F1910 Other psychoactive substance abuse, uncomplicated: Secondary | ICD-10-CM

## 2007-04-22 ENCOUNTER — Telehealth (INDEPENDENT_AMBULATORY_CARE_PROVIDER_SITE_OTHER): Payer: Self-pay | Admitting: Pharmacy Technician

## 2007-04-22 ENCOUNTER — Ambulatory Visit: Payer: Self-pay | Admitting: *Deleted

## 2007-04-22 LAB — CONVERTED CEMR LAB
Eosinophils Absolute: 0 10*3/uL (ref 0.0–0.7)
Eosinophils Relative: 0 % (ref 0–5)
HCT: 37 % (ref 36.0–46.0)
Hemoglobin: 11.6 g/dL — ABNORMAL LOW (ref 12.0–15.0)
Lymphocytes Relative: 43 % (ref 12–46)
Lymphs Abs: 2.9 10*3/uL (ref 0.7–3.3)
MCV: 68.6 fL — ABNORMAL LOW (ref 78.0–100.0)
Monocytes Relative: 5 % (ref 3–11)
RBC: 5.26 M/uL — ABNORMAL HIGH (ref 3.87–5.11)
Saturation Ratios: 16 % — ABNORMAL LOW (ref 20–55)
WBC: 6.8 10*3/uL (ref 4.0–10.5)

## 2007-04-28 ENCOUNTER — Encounter (INDEPENDENT_AMBULATORY_CARE_PROVIDER_SITE_OTHER): Payer: Self-pay | Admitting: *Deleted

## 2007-05-19 ENCOUNTER — Emergency Department (HOSPITAL_COMMUNITY): Admission: EM | Admit: 2007-05-19 | Discharge: 2007-05-19 | Payer: Self-pay | Admitting: Emergency Medicine

## 2007-05-22 ENCOUNTER — Telehealth: Payer: Self-pay | Admitting: *Deleted

## 2007-05-26 ENCOUNTER — Ambulatory Visit: Payer: Self-pay | Admitting: Internal Medicine

## 2007-05-26 DIAGNOSIS — R822 Biliuria: Secondary | ICD-10-CM | POA: Insufficient documentation

## 2007-05-26 DIAGNOSIS — H0520 Unspecified exophthalmos: Secondary | ICD-10-CM

## 2007-05-26 LAB — CONVERTED CEMR LAB: Blood Glucose, Fingerstick: 87

## 2007-06-03 ENCOUNTER — Encounter (INDEPENDENT_AMBULATORY_CARE_PROVIDER_SITE_OTHER): Payer: Self-pay | Admitting: *Deleted

## 2007-06-10 ENCOUNTER — Telehealth (INDEPENDENT_AMBULATORY_CARE_PROVIDER_SITE_OTHER): Payer: Self-pay | Admitting: Pharmacy Technician

## 2007-06-18 ENCOUNTER — Telehealth: Payer: Self-pay | Admitting: Infectious Disease

## 2007-06-19 ENCOUNTER — Encounter (INDEPENDENT_AMBULATORY_CARE_PROVIDER_SITE_OTHER): Payer: Self-pay | Admitting: *Deleted

## 2007-06-23 ENCOUNTER — Ambulatory Visit: Payer: Self-pay | Admitting: *Deleted

## 2007-06-23 LAB — CONVERTED CEMR LAB
ALT: 18 units/L (ref 0–35)
Albumin: 4.3 g/dL (ref 3.5–5.2)
Alkaline Phosphatase: 41 units/L (ref 39–117)
Blood Glucose, Fingerstick: 152
CO2: 24 meq/L (ref 19–32)
Creatinine,U: 170.5 mg/dL
Glucose, Bld: 129 mg/dL — ABNORMAL HIGH (ref 70–99)
Marijuana Metabolite: NEGATIVE
Methadone: NEGATIVE
Opiates: POSITIVE — AB
Phencyclidine (PCP): NEGATIVE
Potassium: 4.6 meq/L (ref 3.5–5.3)
Propoxyphene: NEGATIVE
Sodium: 139 meq/L (ref 135–145)
Total Protein: 7.2 g/dL (ref 6.0–8.3)

## 2007-06-25 ENCOUNTER — Ambulatory Visit: Payer: Self-pay | Admitting: *Deleted

## 2007-07-09 ENCOUNTER — Telehealth: Payer: Self-pay | Admitting: *Deleted

## 2007-07-17 ENCOUNTER — Ambulatory Visit: Payer: Self-pay | Admitting: *Deleted

## 2007-07-17 ENCOUNTER — Encounter (INDEPENDENT_AMBULATORY_CARE_PROVIDER_SITE_OTHER): Payer: Self-pay | Admitting: *Deleted

## 2007-07-17 ENCOUNTER — Observation Stay (HOSPITAL_COMMUNITY): Admission: EM | Admit: 2007-07-17 | Discharge: 2007-07-18 | Payer: Self-pay | Admitting: Emergency Medicine

## 2007-07-30 ENCOUNTER — Telehealth: Payer: Self-pay | Admitting: *Deleted

## 2007-08-04 ENCOUNTER — Emergency Department (HOSPITAL_COMMUNITY): Admission: EM | Admit: 2007-08-04 | Discharge: 2007-08-04 | Payer: Self-pay | Admitting: Family Medicine

## 2007-08-05 ENCOUNTER — Telehealth: Payer: Self-pay | Admitting: *Deleted

## 2007-08-06 ENCOUNTER — Telehealth: Payer: Self-pay | Admitting: *Deleted

## 2007-08-13 ENCOUNTER — Ambulatory Visit: Payer: Self-pay | Admitting: *Deleted

## 2007-08-13 DIAGNOSIS — K589 Irritable bowel syndrome without diarrhea: Secondary | ICD-10-CM

## 2007-08-20 ENCOUNTER — Telehealth: Payer: Self-pay | Admitting: *Deleted

## 2007-09-10 ENCOUNTER — Telehealth: Payer: Self-pay | Admitting: *Deleted

## 2007-09-19 ENCOUNTER — Ambulatory Visit: Payer: Self-pay | Admitting: *Deleted

## 2007-09-19 LAB — CONVERTED CEMR LAB
Blood Glucose, Fingerstick: 207
Cholesterol: 162 mg/dL (ref 0–200)
HDL: 43 mg/dL (ref >39)
Microalb Creat Ratio: 3.6 mg/g (ref 0.0–30.0)
Total CHOL/HDL Ratio: 3.8
Triglycerides: 145 mg/dL (ref <150)
VLDL: 29 mg/dL (ref 0–40)

## 2007-09-28 ENCOUNTER — Emergency Department (HOSPITAL_COMMUNITY): Admission: EM | Admit: 2007-09-28 | Discharge: 2007-09-28 | Payer: Self-pay | Admitting: Family Medicine

## 2007-10-10 ENCOUNTER — Telehealth: Payer: Self-pay | Admitting: *Deleted

## 2007-10-14 ENCOUNTER — Telehealth: Payer: Self-pay | Admitting: *Deleted

## 2007-10-28 ENCOUNTER — Encounter (INDEPENDENT_AMBULATORY_CARE_PROVIDER_SITE_OTHER): Payer: Self-pay | Admitting: Infectious Diseases

## 2007-10-28 ENCOUNTER — Ambulatory Visit: Payer: Self-pay | Admitting: Hospitalist

## 2007-10-28 ENCOUNTER — Telehealth: Payer: Self-pay | Admitting: *Deleted

## 2007-10-28 DIAGNOSIS — N390 Urinary tract infection, site not specified: Secondary | ICD-10-CM | POA: Insufficient documentation

## 2007-10-28 DIAGNOSIS — R42 Dizziness and giddiness: Secondary | ICD-10-CM | POA: Insufficient documentation

## 2007-10-28 DIAGNOSIS — E876 Hypokalemia: Secondary | ICD-10-CM

## 2007-10-28 LAB — CONVERTED CEMR LAB
BUN: 31 mg/dL — ABNORMAL HIGH (ref 6–23)
Bilirubin Urine: NEGATIVE
Bilirubin Urine: NEGATIVE
Blood in Urine, dipstick: NEGATIVE
Calcium: 9.1 mg/dL (ref 8.4–10.5)
Chloride: 92 meq/L — ABNORMAL LOW (ref 96–112)
Creatinine, Ser: 1.62 mg/dL — ABNORMAL HIGH (ref 0.40–1.20)
HCT: 35.4 % — ABNORMAL LOW (ref 36.0–46.0)
Hemoglobin: 11.5 g/dL — ABNORMAL LOW (ref 12.0–15.0)
MCHC: 32.4 g/dL (ref 30.0–36.0)
MCV: 70.8 fL — ABNORMAL LOW (ref 78.0–100.0)
Nitrite: NEGATIVE
Nitrite: NEGATIVE
Platelets: 299 10*3/uL (ref 150–400)
Protein, ur: NEGATIVE mg/dL
RDW: 17.5 % — ABNORMAL HIGH (ref 11.5–15.5)
Specific Gravity, Urine: 1.015
Specific Gravity, Urine: 1.019 (ref 1.005–1.03)
Urobilinogen, UA: 1 (ref 0.0–1.0)

## 2007-11-14 ENCOUNTER — Observation Stay (HOSPITAL_COMMUNITY): Admission: EM | Admit: 2007-11-14 | Discharge: 2007-11-14 | Payer: Self-pay | Admitting: Family Medicine

## 2007-11-14 ENCOUNTER — Ambulatory Visit: Payer: Self-pay | Admitting: Hospitalist

## 2007-11-14 DIAGNOSIS — L02219 Cutaneous abscess of trunk, unspecified: Secondary | ICD-10-CM

## 2007-11-14 DIAGNOSIS — I959 Hypotension, unspecified: Secondary | ICD-10-CM | POA: Insufficient documentation

## 2007-11-14 DIAGNOSIS — L03319 Cellulitis of trunk, unspecified: Secondary | ICD-10-CM

## 2007-11-18 ENCOUNTER — Telehealth (INDEPENDENT_AMBULATORY_CARE_PROVIDER_SITE_OTHER): Payer: Self-pay | Admitting: *Deleted

## 2007-12-17 ENCOUNTER — Ambulatory Visit: Payer: Self-pay | Admitting: *Deleted

## 2007-12-17 LAB — CONVERTED CEMR LAB
CO2: 29 meq/L (ref 19–32)
Chloride: 98 meq/L (ref 96–112)
Glucose, Bld: 439 mg/dL — ABNORMAL HIGH (ref 70–99)
Hgb A1c MFr Bld: 12.3 %
Sodium: 135 meq/L (ref 135–145)

## 2007-12-18 ENCOUNTER — Telehealth (INDEPENDENT_AMBULATORY_CARE_PROVIDER_SITE_OTHER): Payer: Self-pay | Admitting: *Deleted

## 2008-01-14 ENCOUNTER — Ambulatory Visit: Payer: Self-pay | Admitting: Infectious Diseases

## 2008-01-14 DIAGNOSIS — J309 Allergic rhinitis, unspecified: Secondary | ICD-10-CM | POA: Insufficient documentation

## 2008-02-18 ENCOUNTER — Emergency Department (HOSPITAL_COMMUNITY): Admission: EM | Admit: 2008-02-18 | Discharge: 2008-02-18 | Payer: Self-pay | Admitting: Family Medicine

## 2008-03-10 ENCOUNTER — Ambulatory Visit (HOSPITAL_COMMUNITY): Admission: RE | Admit: 2008-03-10 | Discharge: 2008-03-10 | Payer: Self-pay | Admitting: Cardiovascular Disease

## 2008-03-16 ENCOUNTER — Emergency Department (HOSPITAL_COMMUNITY): Admission: EM | Admit: 2008-03-16 | Discharge: 2008-03-16 | Payer: Self-pay | Admitting: Family Medicine

## 2008-05-12 ENCOUNTER — Emergency Department (HOSPITAL_COMMUNITY): Admission: EM | Admit: 2008-05-12 | Discharge: 2008-05-12 | Payer: Self-pay | Admitting: Emergency Medicine

## 2008-06-21 ENCOUNTER — Emergency Department (HOSPITAL_COMMUNITY): Admission: EM | Admit: 2008-06-21 | Discharge: 2008-06-21 | Payer: Self-pay | Admitting: Family Medicine

## 2008-09-08 ENCOUNTER — Emergency Department (HOSPITAL_COMMUNITY): Admission: EM | Admit: 2008-09-08 | Discharge: 2008-09-08 | Payer: Self-pay | Admitting: Family Medicine

## 2008-09-15 ENCOUNTER — Inpatient Hospital Stay (HOSPITAL_COMMUNITY): Admission: EM | Admit: 2008-09-15 | Discharge: 2008-09-16 | Payer: Self-pay | Admitting: Cardiovascular Disease

## 2008-09-21 ENCOUNTER — Telehealth: Payer: Self-pay | Admitting: Gastroenterology

## 2008-10-04 ENCOUNTER — Emergency Department (HOSPITAL_COMMUNITY): Admission: EM | Admit: 2008-10-04 | Discharge: 2008-10-04 | Payer: Self-pay | Admitting: Emergency Medicine

## 2008-10-05 ENCOUNTER — Encounter: Payer: Self-pay | Admitting: Gastroenterology

## 2008-10-05 DIAGNOSIS — K648 Other hemorrhoids: Secondary | ICD-10-CM | POA: Insufficient documentation

## 2008-11-04 ENCOUNTER — Encounter: Payer: Self-pay | Admitting: Gastroenterology

## 2008-11-15 ENCOUNTER — Emergency Department (HOSPITAL_COMMUNITY): Admission: EM | Admit: 2008-11-15 | Discharge: 2008-11-15 | Payer: Self-pay | Admitting: Family Medicine

## 2008-11-25 ENCOUNTER — Emergency Department (HOSPITAL_COMMUNITY): Admission: EM | Admit: 2008-11-25 | Discharge: 2008-11-25 | Payer: Self-pay | Admitting: Family Medicine

## 2008-12-21 ENCOUNTER — Encounter (INDEPENDENT_AMBULATORY_CARE_PROVIDER_SITE_OTHER): Payer: Self-pay | Admitting: Emergency Medicine

## 2008-12-21 ENCOUNTER — Emergency Department (HOSPITAL_COMMUNITY): Admission: EM | Admit: 2008-12-21 | Discharge: 2008-12-21 | Payer: Self-pay | Admitting: Emergency Medicine

## 2008-12-21 ENCOUNTER — Ambulatory Visit: Payer: Self-pay | Admitting: Vascular Surgery

## 2009-01-12 ENCOUNTER — Telehealth: Payer: Self-pay | Admitting: *Deleted

## 2009-02-01 ENCOUNTER — Telehealth (INDEPENDENT_AMBULATORY_CARE_PROVIDER_SITE_OTHER): Payer: Self-pay | Admitting: *Deleted

## 2009-03-06 ENCOUNTER — Emergency Department (HOSPITAL_COMMUNITY): Admission: EM | Admit: 2009-03-06 | Discharge: 2009-03-06 | Payer: Self-pay | Admitting: Emergency Medicine

## 2009-03-09 ENCOUNTER — Inpatient Hospital Stay (HOSPITAL_COMMUNITY): Admission: AD | Admit: 2009-03-09 | Discharge: 2009-03-14 | Payer: Self-pay | Admitting: Cardiovascular Disease

## 2009-03-11 ENCOUNTER — Encounter (INDEPENDENT_AMBULATORY_CARE_PROVIDER_SITE_OTHER): Payer: Self-pay | Admitting: *Deleted

## 2009-03-23 ENCOUNTER — Ambulatory Visit: Payer: Self-pay | Admitting: Gastroenterology

## 2009-03-23 DIAGNOSIS — D509 Iron deficiency anemia, unspecified: Secondary | ICD-10-CM | POA: Insufficient documentation

## 2009-03-23 LAB — CONVERTED CEMR LAB
Basophils Absolute: 0 10*3/uL
Basophils Relative: 0.4 %
Eosinophils Absolute: 0.1 10*3/uL
Eosinophils Relative: 0.8 %
HCT: 34 % — ABNORMAL LOW
Hemoglobin: 10.9 g/dL — ABNORMAL LOW
Lymphocytes Relative: 42.9 %
Lymphs Abs: 3.5 10*3/uL
MCHC: 32.2 g/dL
MCV: 68 fL — ABNORMAL LOW
Monocytes Absolute: 0.4 10*3/uL
Monocytes Relative: 4.6 %
Neutro Abs: 4.2 10*3/uL
Neutrophils Relative %: 51.3 %
Platelets: 317 10*3/uL
RBC: 5 M/uL
RDW: 20.4 % — ABNORMAL HIGH
WBC: 8.2 10*3/uL

## 2009-03-29 ENCOUNTER — Ambulatory Visit: Payer: Self-pay | Admitting: Gastroenterology

## 2009-03-29 LAB — CONVERTED CEMR LAB: Fecal Occult Bld: POSITIVE

## 2009-04-06 ENCOUNTER — Ambulatory Visit: Payer: Self-pay | Admitting: Gastroenterology

## 2009-04-06 ENCOUNTER — Encounter: Payer: Self-pay | Admitting: Gastroenterology

## 2009-04-08 ENCOUNTER — Encounter: Payer: Self-pay | Admitting: Gastroenterology

## 2009-04-12 ENCOUNTER — Emergency Department (HOSPITAL_COMMUNITY): Admission: EM | Admit: 2009-04-12 | Discharge: 2009-04-12 | Payer: Self-pay | Admitting: Emergency Medicine

## 2009-05-05 ENCOUNTER — Telehealth: Payer: Self-pay | Admitting: *Deleted

## 2009-08-18 ENCOUNTER — Ambulatory Visit (HOSPITAL_COMMUNITY): Admission: RE | Admit: 2009-08-18 | Discharge: 2009-08-18 | Payer: Self-pay | Admitting: Internal Medicine

## 2009-08-30 ENCOUNTER — Emergency Department (HOSPITAL_COMMUNITY): Admission: EM | Admit: 2009-08-30 | Discharge: 2009-08-30 | Payer: Self-pay | Admitting: Emergency Medicine

## 2009-09-05 ENCOUNTER — Inpatient Hospital Stay (HOSPITAL_COMMUNITY): Admission: AD | Admit: 2009-09-05 | Discharge: 2009-09-08 | Payer: Self-pay | Admitting: Cardiovascular Disease

## 2009-09-05 ENCOUNTER — Telehealth: Payer: Self-pay | Admitting: *Deleted

## 2009-11-28 ENCOUNTER — Ambulatory Visit: Payer: Self-pay | Admitting: Gastroenterology

## 2009-11-29 ENCOUNTER — Ambulatory Visit: Payer: Self-pay | Admitting: Gastroenterology

## 2009-12-06 ENCOUNTER — Encounter: Payer: Self-pay | Admitting: Gastroenterology

## 2009-12-19 ENCOUNTER — Encounter: Admission: RE | Admit: 2009-12-19 | Discharge: 2009-12-19 | Payer: Self-pay | Admitting: Cardiovascular Disease

## 2010-05-09 ENCOUNTER — Emergency Department (HOSPITAL_COMMUNITY): Admission: EM | Admit: 2010-05-09 | Discharge: 2010-05-09 | Payer: Self-pay | Admitting: Family Medicine

## 2010-07-30 ENCOUNTER — Emergency Department (HOSPITAL_COMMUNITY): Admission: EM | Admit: 2010-07-30 | Discharge: 2010-07-30 | Payer: Self-pay | Admitting: Emergency Medicine

## 2010-09-14 ENCOUNTER — Encounter (HOSPITAL_COMMUNITY)
Admission: RE | Admit: 2010-09-14 | Discharge: 2010-11-04 | Payer: Self-pay | Source: Home / Self Care | Attending: Internal Medicine | Admitting: Internal Medicine

## 2010-09-21 ENCOUNTER — Inpatient Hospital Stay (HOSPITAL_COMMUNITY): Admission: EM | Admit: 2010-09-21 | Discharge: 2010-09-22 | Payer: Self-pay | Admitting: Emergency Medicine

## 2010-10-11 ENCOUNTER — Emergency Department (HOSPITAL_COMMUNITY)
Admission: EM | Admit: 2010-10-11 | Discharge: 2010-10-11 | Disposition: A | Discharge status: Other Acute Inpatient Hospital | Payer: Self-pay | Source: Home / Self Care | Admitting: Family Medicine

## 2010-10-11 ENCOUNTER — Inpatient Hospital Stay (HOSPITAL_COMMUNITY)
Admission: EM | Admit: 2010-10-11 | Discharge: 2010-10-13 | Payer: Self-pay | Source: Home / Self Care | Attending: Internal Medicine | Admitting: Internal Medicine

## 2010-11-16 ENCOUNTER — Ambulatory Visit: Admission: RE | Admit: 2010-11-16 | Payer: Self-pay | Source: Home / Self Care | Admitting: Gastroenterology

## 2010-11-26 ENCOUNTER — Encounter: Payer: Self-pay | Admitting: Cardiovascular Disease

## 2010-11-26 ENCOUNTER — Encounter: Payer: Self-pay | Admitting: Internal Medicine

## 2010-12-07 NOTE — Letter (Signed)
Summary: Results Letter  [facility]  [address] [place], [place] [address]   Phone: [phone]  Fax: [phone]        December 06, 2009 MRN: [id number]    Chelsea Lester [address] [place], [place]  [address]    Dear Ms. Reppert,   Your stomach biopsies demonstrated inflammatory changes only.    Please follow the recommendations previously discussed.  Should you have any immediate concerns or questions, feel free to contact me at the office.    Sincerely,  Barbette Hair. Arlyce Dice, M.D., [facility]         Sincerely,  Louis Meckel MD  This letter has been electronically signed by your physician.  Appended Document: Results Letter letter mailed 2.2.11

## 2010-12-07 NOTE — Procedures (Signed)
Summary: Upper Endoscopy  Patient: [name] Note: All result statuses are Final unless otherwise noted.  Tests: (1) Upper Endoscopy (EGD)   EGD Upper Endoscopy       DONE     [facility]     [address]     [place], [place]  [address]           ENDOSCOPY PROCEDURE REPORT           PATIENT:  Chelsea Lester, Chelsea Lester  MR#:  [id number]     BIRTHDATE:  Oct 22, 1956, 53 yrs. old  GENDER:  female           ENDOSCOPIST:  Barbette Hair. Arlyce Dice, MD     Referred by:           PROCEDURE DATE:  11/29/2009     PROCEDURE:  EGD with biopsy     ASA CLASS:  Class II     INDICATIONS:  dysphagia           MEDICATIONS:   There was residual sedation effect present from     prior procedure., Fentanyl 25 mcg IV, Versed 1 mg IV,     glycopyrrolate (Robinal) 0.2 mg IV, 0.6cc simethancone 0.6 cc PO     TOPICAL ANESTHETIC:  Exactacain Spray           DESCRIPTION OF PROCEDURE:   After the risks benefits and     alternatives of the procedure were thoroughly explained, informed     consent was obtained.  The LB GIF-H180 [id number] endoscope was     introduced through the mouth and advanced to the third portion of     the duodenum, without limitations.  The instrument was slowly     withdrawn as the mucosa was fully examined.     <<PROCEDUREIMAGES>>           A sessile polyp was found. Sessile polyps and enlarged gastric     folds in antrum. Polyp measures 12mm. Bxs taken (see image1 and     image2).  Otherwise the examination was normal. Dilation with     maloney dilator 18mm Moderate resistance; no heme    Retroflexed     views revealed no abnormalities.    The scope was then withdrawn     from the patient and the procedure completed.           COMPLICATIONS:  None           ENDOSCOPIC IMPRESSION:     1) Sessile polyp     2) Otherwise normal examination     3) s/p maloney dilitation for dysphagia     RECOMMENDATIONS:     1) dilatations PRN           REPEAT EXAM:  No        ______________________________     Barbette Hair. Arlyce Dice, MD           CC:  Manning Charity, MD           n.     Rosalie Doctor:   Barbette Hair. Chyrel Taha at 11/29/2009 02:46 PM           Royanne Foots, [id number]  Note: An exclamation mark (!) indicates a result that was not dispersed into the flowsheet. Document Creation Date: 11/29/2009 2:46 PM _______________________________________________________________________  (1) Order result status: Final Collection or observation date-time: 11/29/2009 14:38 Requested date-time:  Receipt date-time:  Reported date-time:  Referring Physician:   Ordering Physician: Melvia Heaps [phone]) Specimen Source:  Source: Launa Grill Order Number: [id number] Lab site:

## 2010-12-07 NOTE — Procedures (Signed)
Summary: Colonoscopy  Patient: Chelsea Lester Note: All result statuses are Final unless otherwise noted.  Tests: (1) Colonoscopy (COL)   COL Colonoscopy           DONE (C)     [facility]     [address]     [place], [place]  [address]           COLONOSCOPY PROCEDURE REPORT           PATIENT:  [name], [name]  MR#:  [id number]     BIRTHDATE:  11-16-1955, 53 yrs. old  GENDER:  female           ENDOSCOPIST:  Barbette Hair. Arlyce Dice, MD     Referred by:           PROCEDURE DATE:  11/29/2009     PROCEDURE:  Colonoscopy with snare polypectomy     ASA CLASS:  Class II     INDICATIONS:  history of pre-cancerous (adenomatous) colon polyps                 MEDICATIONS:   Fentanyl 75 mcg IV, Versed 9 mg IV           DESCRIPTION OF PROCEDURE:   After the risks benefits and     alternatives of the procedure were thoroughly explained, informed     consent was obtained.  Digital rectal exam was performed and     revealed no abnormalities.   The LB CF-H180AL [id number] endoscope     was introduced through the anus and advanced to the cecum, which     was identified by both the appendix and ileocecal valve, without     limitations.  The quality of the prep was adequate, using     MoviPrep.  The instrument was then slowly withdrawn as the colon     was fully examined.     <<PROCEDUREIMAGES>>           FINDINGS:  A sessile polyp was found in the ascending colon. It     was 3 mm in size. Polyp was snared without cautery. Retrieval was     successful (see image8). snare polyp  Moderate diverticulosis was     found (see image7, image9, image3, and image1). Scattered     wide-mouthed diverticula from sigmoid to cecum  A sessile polyp     was found in the descending colon. It was 3 mm in size. Polyp was     snared without cautery. Retrieval was successful. snare polyp     This was otherwise a normal examination of the colon (see image2,     image4, image9, and image11).   Retroflexed views in  the rectum     revealed no abnormalities.    The scope was then withdrawn from     the patient and the procedure completed.           COMPLICATIONS:  None           ENDOSCOPIC IMPRESSION:     1) 3 mm sessile polyp in the ascending colon     2) Moderate diverticulosis     3) 3 mm sessile polyp in the descending colon     4) Otherwise normal examination     RECOMMENDATIONS:     1) If the polyp(s) removed today are proven to be adenomatous     (pre-cancerous) polyps, you will need a repeat colonoscopy in 5     years. Otherwise you should  continue to follow colorectal cancer     screening guidelines for "routine risk" patients with colonoscopy     in 10 years.           REPEAT EXAM:   You will receive a letter from Dr. Arlyce Dice in 1-2     weeks, after reviewing the final pathology, with followup     recommendations.           ______________________________     Barbette Hair Arlyce Dice, MD           cc: Dr. Manning Charity           n.     REVISED:  11/29/2009 02:41 PM     eSIGNED:   Barbette Hair. [place]andoah Vandergriff at 11/29/2009 02:41 PM           Royanne Foots, [id number]  Note: An exclamation mark (!) indicates a result that was not dispersed into the flowsheet. Document Creation Date: 11/29/2009 2:41 PM _______________________________________________________________________  (1) Order result status: Final Collection or observation date-time: 11/29/2009 14:27 Requested date-time:  Receipt date-time:  Reported date-time:  Referring Physician:   Ordering Physician: Melvia Heaps [id number]) Specimen Source:  Source: Launa Grill Order Number: [id number] Lab site:   Appended Document: Colonoscopy     Procedures Next Due Date:    Colonoscopy: 11/2014

## 2010-12-07 NOTE — Letter (Signed)
Summary: [facility] Instructions  [facility]  [address] [place], [place] [address]   Phone: [phone]  Fax: [phone]       Chelsea Lester    1956-02-04    MRN: [id number]       Procedure Day /Date:TUESDAY 11/29/2009     Arrival Time: 1:30PM     Procedure Time:2:30PM     Location of Procedure:                    X  [facility] (4th Floor)   PREPARATION FOR COLONOSCOPY WITH MIRALAX/EGD WITH DIL  Starting 5 days prior to your procedure TODAY  do not eat nuts, seeds, popcorn, corn, beans, peas,  salads, or any raw vegetables.  Do not take any fiber supplements (e.g. Metamucil, Citrucel, and Benefiber). ____________________________________________________________________________________________________   THE DAY BEFORE YOUR PROCEDURE         DATE:11/28/2009 DAY: MONDAY  1   Drink clear liquids the entire day-NO SOLID FOOD  2   Do not drink anything colored red or purple.  Avoid juices with pulp.  No orange juice.  3   Drink at least 64 oz. (8 glasses) of fluid/clear liquids during the day to prevent dehydration and help the prep work efficiently.  CLEAR LIQUIDS INCLUDE: Water Jello Ice Popsicles Tea (sugar ok, no milk/cream) Powdered fruit flavored drinks Coffee (sugar ok, no milk/cream) Gatorade Juice: apple, white grape, white cranberry  Lemonade Clear bullion, consomm, broth Carbonated beverages (any kind) Strained chicken noodle soup Hard Candy  4   Mix the entire bottle of Miralax with 64 oz. of Gatorade/Powerade in the morning and put in the refrigerator to chill.  5   At 3:00 pm take 2 Dulcolax/Bisacodyl tablets.  6   At 4:30 pm take one Reglan/Metoclopramide tablet.  7  Starting at 5:00 pm drink one 8 oz glass of the Miralax mixture every 15-20 minutes until you have finished drinking the entire 64 oz.  You should finish drinking prep around 7:30 or 8:00 pm.  8   If you are nauseated, you may take the 2nd  Reglan/Metoclopramide tablet at 6:30 pm.        9    At 8:00 pm take 2 more DULCOLAX/Bisacodyl tablets.     THE DAY OF YOUR PROCEDURE      DATE:  11/29/2009 DAY: Jake Shark  You may drink clear liquids until 12:30PM  (2 HOURS BEFORE PROCEDURE).   MEDICATION INSTRUCTIONS  Unless otherwise instructed, you should take regular prescription medications with a small sip of water as early as possible the morning of your procedure.  Diabetic patients - see separate instructions.           OTHER INSTRUCTIONS  You will need a responsible adult at least 55 years of age to accompany you and drive you home.   This person must remain in the waiting room during your procedure.  Wear loose fitting clothing that is easily removed.  Leave jewelry and other valuables at home.  However, you may wish to bring a book to read or an iPod/MP3 player to listen to music as you wait for your procedure to start.  Remove all body piercing jewelry and leave at home.  Total time from sign-in until discharge is approximately 2-3 hours.  You should go home directly after your procedure and rest.  You can resume normal activities the day after your procedure.  The day of your procedure you should not:  Drive   Make legal decisions   Operate machinery   Drink alcohol   Return to work  You will receive specific instructions about eating, activities and medications before you leave.   The above instructions have been reviewed and explained to me by   _______________________    I fully understand and can verbalize these instructions _____________________________ Date _______

## 2010-12-07 NOTE — Letter (Signed)
Summary: Diabetic Instructions  [facility]  [address] [place], [place] [address]   Phone: [phone]  Fax: [phone]    LEEAN AMEZCUA Mar 17, 1956 MRN: [id number]   _  _   ORAL DIABETIC MEDICATION INSTRUCTIONS  The day before your procedure:   Take your diabetic pill as you do normally  The day of your procedure:   Do not take your diabetic pill    We will check your blood sugar levels during the admission process and again in Recovery before discharging you home  ________________________________________________________________________  X  INSULIN (LONG ACTING) MEDICATION INSTRUCTIONS (Lantus, NPH, 70/30, Humulin, Novolin-N)   The day before your procedure:   Take  your regular evening dose    The day of your procedure:   Do not take your morning dose    _  _   INSULIN (SHORT ACTING) MEDICATION INSTRUCTIONS (Regular, Humulog, Novolog)   The day before your procedure:   Do not take your evening dose   The day of your procedure:   Do not take your morning dose   _  _   INSULIN PUMP MEDICATION INSTRUCTIONS  We will contact the physician managing your diabetic care for written dosage instructions for the day before your procedure and the day of your procedure.  Once we have received the instructions, we will contact you.

## 2010-12-07 NOTE — Assessment & Plan Note (Signed)
Summary: FOLLOW UP--CHE    History of Present Illness Visit Type: Follow-up Visit Primary GI MD: Melvia Heaps MD [facility] Primary Provider: Manning Charity, MD Chief Complaint: Discuss colon. Pt states she has had a colon at [facility] in the past and had colon polyps. Pt wants to discuss having another colon. Pt c/o some hard stools but no blood.  History of Present Illness:   Chelsea Lester has returned for evaluation of colon polyps and dysphagia.  Examination in 2008  demonstrated diverticulosis and hemorrhoids.  She has a history of an esophageal stricture and was last dilated also in 2008.  She probably underwent a colonoscopy in [facility] over a year ago where polyps were seen and removed.  She was instructed to return in one year because of remaining polyps.  She has no lower GI complaints.  She does complain of recurrent dysphagia to solids.   GI Review of Systems    Reports dysphagia with solids and  nausea.      Denies abdominal pain, acid reflux, belching, bloating, chest pain, dysphagia with liquids, heartburn, loss of appetite, vomiting, vomiting blood, weight loss, and  weight gain.      Reports constipation.     Denies anal fissure, black tarry stools, change in bowel habit, diarrhea, diverticulosis, fecal incontinence, heme positive stool, hemorrhoids, irritable bowel syndrome, jaundice, light color stool, liver problems, rectal bleeding, and  rectal pain.    Current Medications (verified): 1)  Albuterol 90 Mcg/act Aers (Albuterol) .... Use As Directed When Needed 2)  Atrovent Hfa  Aers (Ipratropium Bromide Hfa Aers) .... Use As Directed Three Times A Day 3)  Advair Hfa  Aero (Fluticasone-Salmeterol Aero) .... Use As Directed 4)  Aspirin 81 Mg Tbec (Aspirin) .... Take 1 Tablet By Mouth Once A Day 5)  Metoprolol Tartrate 50 Mg Tabs (Metoprolol Tartrate) .... Take 1 Tablet By Mouth Twice A Day 6)  Novolin 70/30  Susp (Insulin Isophane & Reg (Human) Susp) .... 55 Units in Morning  and 40 Units in Evening 7)  Tricor 145 Mg Tabs (Fenofibrate) .... Take 1 Tablet By Mouth Once A Day 8)  Alprazolam 0.5 Mg Tabs (Alprazolam) .... Take One Tablet At Bedtime Daily. 9)  Hyoscyamine Sulfate 0.375 Mg Tb12 (Hyoscyamine Sulfate) .... Take 1 Tablet By Mouth Twice A Day As Needed. 10)  Loratadine 10 Mg  Tabs (Loratadine) .... Take One Tablet By Mouth Daily 11)  Simvastatin 40 Mg  Tabs (Simvastatin) .... Take 1 Tablet By Mouth Once A Day 12)  Sm Insulin Syringe 31g X 5/16" 0.5 Ml  Misc (Insulin Syringe-Needle U-100) 13)  Darvocet-N 100 100-650 Mg  Tabs (Propoxyphene N-Apap) .... Take 1 Tablet By Mouth Four Times A Day  As Needed 14)  Ferrous Sulfate 325 (65 Fe) Mg Tbec (Ferrous Sulfate) .Marland Kitchen.. 1 Tablet By Mouth Once Daily 15)  Truetrack Test  Strp (Glucose Blood) .... Use As Directed. 16)  Lancets  Misc (Lancets) .... Use As Directed  Allergies (verified): No Known Drug Allergies  Past History:  Past Medical History: Reviewed history from 09/12/2006 and no changes required. Asthma Coronary artery disease- 20-30% LAD per cath 8/06 Diabetes mellitus, type II GERD Hypertension Osteoarthritis Microcytosis Sleep disorder- details unknown Obesity Strong family history of breast cancer Adenomatous colon polyp- needs repeat colonoscopy in 2011 Depression- severe; required hospitalization in 11/07  Past Surgical History: Reviewed history from 09/12/2006 and no changes required. Hysterectomy- around 2000  Family History: Reviewed history from 03/23/2009 and no changes required. Family  History of rectal cancer-sister and neice Lung cancer-father Family History of Breast Cancer:sisters, mother and neices  Social History: Reviewed history from 03/23/2009 and no changes required. Occupation: disabled  Patient has never smoked.  Alcohol Use - no Illicit Drug Use - no Married  Review of Systems  The patient denies allergy/sinus, anemia, anxiety-new, arthritis/joint pain, back  pain, blood in urine, breast changes/lumps, change in vision, confusion, cough, coughing up blood, depression-new, fainting, fatigue, fever, headaches-new, hearing problems, heart murmur, heart rhythm changes, itching, menstrual pain, muscle pains/cramps, night sweats, nosebleeds, pregnancy symptoms, shortness of breath, skin rash, sleeping problems, sore throat, swelling of feet/legs, swollen lymph glands, thirst - excessive , urination - excessive , urination changes/pain, urine leakage, vision changes, and voice change.    Vital Signs:  Patient profile:   55 year old female Height:      67 inches Weight:      300.25 pounds BMI:     47.20 Pulse rate:   70 / minute Pulse rhythm:   regular BP sitting:   120 / 70  (left arm) Cuff size:   large  Vitals Entered By: Chelsea Lester CMA Chelsea Lester) (November 28, 2009 8:43 AM)  Physical Exam  Additional Exam:  She is an overweight female in no acute distress  skin: anicteric HEENT: normocephalic; PEERLA; no nasal or pharyngeal abnormalities neck: supple nodes: no cervical lymphadenopathy chest: clear to ausculatation and percussion heart: no murmurs, gallops, or rubs abd: soft, nontender; BS normoactive; no abdominal masses, tenderness, organomegaly rectal: deferred ext: no cynanosis, clubbing, edema skeletal: no deformities neuro: oriented x 3; no focal abnormalities    Impression & Recommendations:  Problem # 1:  POLYP, COLON (ICD-211.3) Plan followup colonoscopy to rule out recurrent polyps  Problem # 2:  ESOPHAGEAL STRICTURE (ICD-530.3) Assessment: Deteriorated  plan repeat upper endoscopy with dilatation  Orders: Colon/Endo (Colon/Endo)  Problem # 3:  DIABETES MELLITUS, TYPE II (ICD-250.00) Assessment: Comment Only  Patient Instructions: 1)  CC Chelsea Gunther, MD 2)  Colonoscopy and Flexible Sigmoidoscopy brochure given.  3)  Conscious Sedation brochure given.  4)  Upper Endoscopy with Dilatation brochure given. 5)   Your Colonoscopy/Endo  is scheduled for 11/29/2009 at 2:30pm 6)  You can pick up your Miralax prep from your pharmacy today 7)  You have been instructed on diabetic medications  8)  The medication list was reviewed and reconciled.  All changed / newly prescribed medications were explained.  A complete medication list was provided to the patient / caregiver. Prescriptions: DULCOLAX 5 MG  TBEC (BISACODYL) Day before procedure take 2 at 3pm and 2 at 8pm.  #4 x 0   Entered by:   Merri Ray CMA (AAMA)   Authorized by:   Louis Meckel MD   Signed by:   Merri Ray CMA (AAMA) on 11/28/2009   Method used:   Electronically to        [facility] (retail)       [address]       [place], [place]  [address]       Ph: [phone]       Fax: [phone]   RxID:   [id number] REGLAN 10 MG  TABS (METOCLOPRAMIDE HCL) As per prep instructions.  #2 x 0   Entered by:   Merri Ray CMA (AAMA)   Authorized by:   Louis Meckel MD   Signed by:   Merri Ray CMA (AAMA) on 11/28/2009   Method used:   Electronically  to        [facility]* (retail)       [address]       [place], [place]  [address]       Ph: [phone]       Fax: [phone]   RxID:   [id number] MIRALAX   POWD (POLYETHYLENE GLYCOL 3350) As per prep  instructions.  #255gm x 0   Entered by:   Merri Ray CMA (AAMA)   Authorized by:   Louis Meckel MD   Signed by:   Merri Ray CMA (AAMA) on 11/28/2009   Method used:   Electronically to        [facility] (retail)       [address]       [place], [place]  [id number]       Ph: [phone]       Fax: [phone]   RxID:   [id number]

## 2010-12-07 NOTE — Procedures (Signed)
Summary: Colonoscopy/[facility]  Colonoscopy/[facility]   Imported By: Lester [place] 12/26/2009 09:04:29  _____________________________________________________________________  External Attachment:    Type:   Image     Comment:   External Document

## 2010-12-07 NOTE — Letter (Signed)
Summary: Patient Notice- Polyp Results  [facility]  [address] [place], [place] [address]   Phone: [phone]  Fax: [phone]        December 06, 2009 MRN: [id number]    Chelsea Lester [address] [place], [place]  [address]    Dear Ms. Newhall,  I am pleased to inform you that the colon polyp(s) removed during your recent colonoscopy was (were) found to be benign (no cancer detected) upon pathologic examination.  I recommend you have a repeat colonoscopy examination in 5_ years to look for recurrent polyps, as having colon polyps increases your risk for having recurrent polyps or even colon cancer in the future.  Should you develop new or worsening symptoms of abdominal pain, bowel habit changes or bleeding from the rectum or bowels, please schedule an evaluation with either your primary care physician or with me.  Additional information/recommendations:  __ No further action with gastroenterology is needed at this time. Please      follow-up with your primary care physician for your other healthcare      needs.  __ Please call [phone] to schedule a return visit to review your      situation.  __ Please keep your follow-up visit as already scheduled.  _x_ Continue treatment plan as outlined the day of your exam.  Please call us if you are having persistent problems or have questions about your condition that have not been fully answered at this time.  Sincerely,  Louis Meckel MD  This letter has been electronically signed by your physician.  Appended Document: Patient Notice- Polyp Results letter mailed 2.2.11

## 2010-12-22 ENCOUNTER — Encounter: Payer: Self-pay | Admitting: Gastroenterology

## 2010-12-22 ENCOUNTER — Encounter (INDEPENDENT_AMBULATORY_CARE_PROVIDER_SITE_OTHER): Payer: Self-pay | Admitting: *Deleted

## 2010-12-22 ENCOUNTER — Ambulatory Visit (INDEPENDENT_AMBULATORY_CARE_PROVIDER_SITE_OTHER): Payer: Medicaid Other | Admitting: Gastroenterology

## 2010-12-22 DIAGNOSIS — K222 Esophageal obstruction: Secondary | ICD-10-CM

## 2010-12-22 DIAGNOSIS — R1310 Dysphagia, unspecified: Secondary | ICD-10-CM

## 2010-12-25 ENCOUNTER — Ambulatory Visit: Payer: Self-pay | Admitting: Gastroenterology

## 2010-12-26 NOTE — H&P (Signed)
Chelsea, Lester              ACCOUNT NO.:  [id number]  MEDICAL RECORD NO.:  [id number]          PATIENT TYPE:  OBV  LOCATION:  1824                         FACILITY:  [facility]  PHYSICIAN:  Lucile Crater, MD         DATE OF BIRTH:  1956-03-09  DATE OF ADMISSION:  09/21/2010 DATE OF DISCHARGE:                             HISTORY & PHYSICAL   PRIMARY CARE PHYSICIAN:  Dr. Ricki Rodriguez.  CHIEF COMPLAINT:  Dizziness.  HISTORY OF PRESENT ILLNESS:  The patient is a 55 year old female with a longstanding history of hypertension, diabetes mellitus, hyperlipidemia and questionable coronary artery disease.  She was brought to the ER by her sister after she had a brief episode of syncope earlier last night. The patient states that she was sitting in the chair and felt lightheadedness and reportedly the patient's family was calling out her name, but she did not respond.  It is unclear for how long she had the syncopal spell.  There was no seizure witnessed and the patient had no fall.  She had a CT scan of the head done as a part of the workup in the ER and this was negative for any acute changes.  The patient denies having had any similar episodes in the past.  REVIEW OF SYSTEMS:  A complete review of systems was done which included; general, head, eyes, ears, nose and throat, cardiovascular, respiratory, GI, GU, endocrine, musculoskeletal, skin, neurologic and psychiatric and all are within normal limits other than what is mentioned in the history of present illness.  PAST MEDICAL HISTORY: 1. Hypertension. 2. Diabetes mellitus type 2. 3. Hyperlipidemia. 4. Gastroesophageal reflux disease. 5. Osteoarthritis. 6. Bronchial asthma. 7. Morbid obesity. 8. MRSA knee abscess. 9. Nonobstructive coronary artery disease on cath in September 2007. 10.Major depressive disorder. 11.Esophageal stricture, status post dilatation. 12.Exophthalmus bilaterally, status post lateral  tarsorrhaphy. 13.Microcytic anemia with obstructive sleep apnea. 14.Recurrent urinary tract infections. 15.Medication noncompliance. 16.Bilateral feet cellulitis.  ALLERGIES:  NONE.  CURRENT MEDICATIONS AT HOME:  The patient cannot recollect a complete list of the medications that she is taking at home, all she could say was she takes a blood pressure pill, she takes insulin, she takes a pill for diabetes, she takes aspirin, she takes a cholesterol pill.  The doses of none of these medications are available.  She also takes an albuterol inhaler, but we do not have any details of this.  The last discharge summary note from November 2010, the list might not be accurate at this time.  SOCIAL HISTORY:  The patient currently lives in [place].  She is unemployed.  She has two children.  She is divorced.  There is no history of tobacco abuse.  She denies use of alcohol or illicit drugs.  FAMILY HISTORY:  There is a very strong family history of breast cancer in the family.  Her father also had a history of coronary artery disease and he passed away at 45 from lung cancer.  PHYSICAL EXAMINATION:  VITALS:  T-max 98.2, pulse rate 87, respiratory rate 20, blood pressure 135/70.  O2 saturation 94%. GENERAL APPEARANCE:  Not  in acute distress, alert and oriented x3. Afebrile. HEENT:  Normocephalic, atraumatic.  Exophthalmos bilaterally.  Pupils are equal and reactive to light and accommodation. EXTREMITIES:  No clubbing, cyanosis or edema. CVS:  Regular rhythm, rate is normal.  No murmurs, rubs or gallops. LUNGS:  Clear to auscultation bilaterally. ABDOMEN:  Soft, nontender, nondistended.  No hepatosplenomegaly. EXTREMITIES:  No clubbing, cyanosis or edema. NEUROLOGIC:  Grossly nonfocal.  CT head without contrast:  No acute intracranial abnormality.  Mild chronic left sphenoid sinusitis.  Mild bilateral carotid- atherosclerosis.  EKG reveals normal sinus rhythm at a rate of 80 beats per  minute.  Normal PR and QT intervals.  The QRS morphology is normal. The axis is normal.  No ST-T changes. Sodium 139, potassium 4.1, chloride 101, bicarb 27, BUN 9, creatinine 0.75, blood glucose 140.  Total protein 7.2, albumin 3.8, ALT 18, AST 21, alkaline phos was 56, total bili 0.7.  Chest x-ray two-view: Suboptimal inspiration accounts for atelectasis in the lower lobes.  No acute cardiopulmonary process otherwise.  CK-MB 2.1, troponin less than 0.05.  Urinalysis within normal limits. WBC 8200, hemoglobin 11.6, hematocrit 36.8, platelets 248,000.  Glucose 145.  ASSESSMENT/PLAN: 1. Syncope, unclear etiology at this time.  She does have risk factors     for cerebrovascular disease.  She has no focal neurological     deficits to suspect cerebrovascular accident, transient ischemic     attack is definitely a possibility and arrhythmia could have caused     this also.  We will admit her to the telemetry floor for closer     monitoring.  I have spoken with Dr. Algie Coffer, who is going to take     over her care.  I would defer the patient for an MRI or 2-D echo to     him. 2. Hypertension.  Initially when she came to the ER, her blood    pressure of little high at 174/90, but then normalized.     Unfortunately, we do not have any list of home medications.  I     would start her on Cozaar 100 mg; this is what she was on in     November 2010.  We will also have her on metoprolol 50 mg p.o.     b.i.d.  Dr. Algie Coffer can adjust the medications. 3. Diabetes mellitus.  It is unclear what kind of insulin she takes at     home.  We will have her here on sliding scale insulin and also     Lantus 20 units at bedtime. 4. Hyperlipidemia.  We will start her on statin. 5. Iron-deficiency anemia, stable. 6. Deep venous thrombosis prophylaxis with unfractionated heparin. 7. Code status is full.  The patient will be admitted to telemetry floor under the service of Dr. Algie Coffer.     Lucile Crater,  MD     TA/MEDQ  D:  09/21/2010  T:  09/21/2010  Job:  [id number]  Electronically Signed by Lucile Crater MD on 12/26/2010 04:09:48 PM

## 2010-12-27 NOTE — Assessment & Plan Note (Signed)
Summary: ABD PAIN/SCHED W-PT/$3COPAY/MEDLIST/CX POLICY    History of Present Illness Visit Type: Follow-up Visit Primary GI MD: Melvia Heaps MD [facility] Primary Provider: Manning Charity, MD Requesting Provider: n/a Chief Complaint: Patient comes for f/u after endo/colon. She states that she continues to have dysphagia to solids and fluids on an intermittent basis. She also c/o constipation. History of Present Illness:    Chelsea Lester has returned for evaluation of recurrent dysphagia. Approximately one year ago a distal esophageal stricture was dilated. Non-adenomatous gastric polyps were seen and biopsied. Dysphagia immediately improved. She was doing well until approximately 2 months ago when she noticed recurrent dysphagia to solids and, to a lesser extent,  liquids.   The patient is a insulin-dependent diabetic with coronary artery disease. An adenomatous polyp was removed from her colon one year ago.   GI Review of Systems    Reports abdominal pain, acid reflux, belching, bloating, chest pain, dysphagia with liquids, dysphagia with solids, heartburn, loss of appetite, and  nausea.     Location of  Abdominal pain: upper abdomen.    Denies vomiting, vomiting blood, weight loss, and  weight gain.      Reports constipation, diverticulosis, and  irritable bowel syndrome.     Denies anal fissure, black tarry stools, change in bowel habit, diarrhea, fecal incontinence, heme positive stool, hemorrhoids, jaundice, light color stool, liver problems, rectal bleeding, and  rectal pain. Preventive Screening-Counseling & Management  Caffeine-Diet-Exercise     Does Patient Exercise: yes    Current Medications (verified): 1)  Albuterol 90 Mcg/act Aers (Albuterol) .... Use As Directed When Needed 2)  Atrovent Hfa  Aers (Ipratropium Bromide Hfa Aers) .... Use As Directed Three Times A Day 3)  Advair Hfa  Aero (Fluticasone-Salmeterol Aero) .... Use As Directed 4)  Aspirin 81 Mg Tbec (Aspirin) .... Take  1 Tablet By Mouth Once A Day 5)  Metoprolol Tartrate 50 Mg Tabs (Metoprolol Tartrate) .... Take 1 Tablet By Mouth Twice A Day 6)  Novolin 70/30  Susp (Insulin Isophane & Reg (Human) Susp) .... 55 Units in Morning and 40 Units in Evening 7)  Tricor 145 Mg Tabs (Fenofibrate) .... Take 1 Tablet By Mouth Once A Day 8)  Alprazolam 0.5 Mg Tabs (Alprazolam) .... Take One Tablet At Bedtime Daily. 9)  Hyoscyamine Sulfate 0.375 Mg Tb12 (Hyoscyamine Sulfate) .... Take 1 Tablet By Mouth Twice A Day As Needed. 10)  Loratadine 10 Mg  Tabs (Loratadine) .... Take One Tablet By Mouth Daily 11)  Simvastatin 40 Mg  Tabs (Simvastatin) .... Take 1 Tablet By Mouth Once A Day 12)  Sm Insulin Syringe 31g X 5/16" 0.5 Ml  Misc (Insulin Syringe-Needle U-100) 13)  Ferrous Sulfate 325 (65 Fe) Mg Tbec (Ferrous Sulfate) .Marland Kitchen.. 1 Tablet By Mouth Once Daily 14)  Truetrack Test  Strp (Glucose Blood) .... Use As Directed. 15)  Lancets  Misc (Lancets) .... Use As Directed  Allergies (verified): No Known Drug Allergies  Past History:  Past Medical History: Reviewed history from 09/12/2006 and no changes required. Asthma Coronary artery disease- 20-30% LAD per cath 8/06 Diabetes mellitus, type II GERD Hypertension Osteoarthritis Microcytosis Sleep disorder- details unknown Obesity Strong family history of breast cancer Adenomatous colon polyp- needs repeat colonoscopy in 2011 Depression- severe; required hospitalization in 11/07  Past Surgical History: Reviewed history from 09/12/2006 and no changes required. Hysterectomy- around 2000  Family History: Family History of rectal cancer-sister and neice Lung cancer-father Family History of Breast Cancer:sisters, mother and neices Family History  of Colon Cancer:Aunt, Uncle Family History of Liver Cancer: Brother, Nephew Family History of Esophageal Cancer: Father Family History of Clotting disorder: sisters Family History of Diabetes: sisters, brothers, neices,  nephews, son and daughter Family History of Heart Disease: Sister, Brother Family History of Kidney Disease: Sister  Social History: Occupation: disabled  Patient has never smoked.  Alcohol Use - no Illicit Drug Use - no Married Patient gets regular exercise.-walks Does Patient Exercise:  yes  Review of Systems       The patient complains of arthritis/joint pain, back pain, cough, muscle pains/cramps, night sweats, shortness of breath, and sleeping problems.  The patient denies allergy/sinus, anemia, anxiety-new, blood in urine, breast changes/lumps, change in vision, confusion, coughing up blood, depression-new, fainting, fatigue, fever, headaches-new, hearing problems, heart murmur, heart rhythm changes, itching, menstrual pain, pregnancy symptoms, sore throat, swelling of feet/legs, swollen lymph glands, thirst - excessive , urination - excessive , urination changes/pain, urine leakage, vision changes, and voice change.         All other systems were reviewed and were negative   Vital Signs:  Patient profile:   55 year old female Height:      67 inches Weight:      294.50 pounds BMI:     46.29 BSA:     2.39 Pulse rate:   76 / minute Pulse rhythm:   regular BP sitting:   118 / 86  (left arm) Cuff size:   large  Vitals Entered By: Lamona Curl CMA Duncan Dull) (December 22, 2010 10:55 AM)  Physical Exam  Additional Exam:   On physical exam she is an obese female  Physical Exam: General:   WDWN HEENT:   anicteric.  No pharyngeal abnormalities Neck:   No masses, thyroidmegaly Nodes:   No cervical, axillary, inguinal adenopathy Chest:    Clear to auscultation Cardiac:   No murmurs, gallops, rubs Abdomen:   BS active.  No abd masses, tenderness, organomegaly Rectal:   Deferred Extremities:   No cyanosis, clubbing, edema Skeletal:   No deformities Neuro:   Alert, oriented x3.  No focal abnormalities     Impression & Recommendations:  Problem # 1:   ESOPHAGEAL STRICTURE (ICD-530.3) Assessment Deteriorated  Plan repeat endoscopy with dilatation  Problem # 2:  POLYP, COLON (ICD-211.3)  Plan followup colonoscopy in 2016  Problem # 3:  DIABETES MELLITUS, TYPE II (ICD-250.00) Assessment: Comment Only  Other Orders: EGD SAV (EGD SAV)  Patient Instructions: 1)  Copy sent to : Manning Charity, MD 2)  Your EGD is scheduled on 12/29/2010 at 9:30am 3)  Conscious Sedation brochure given.  4)  Upper Endoscopy with Dilatation brochure given.  5)  The medication list was reviewed and reconciled.  All changed / newly prescribed medications were explained.  A complete medication list was provided to the patient / caregiver.

## 2010-12-27 NOTE — Letter (Signed)
Summary: EGD Instructions  [facility]  [address] [place], [place] [address]   Phone: [phone]  Fax: [phone]       Chelsea Lester    14-Oct-1956    MRN: [id number]       Procedure Day /Date:FRIDAY 12/29/2010     Arrival Time: 8:30AM     Procedure Time:9;30AM     Location of Procedure:                    X _ [facility] (4th Floor)    PREPARATION FOR ENDOSCOPY   On 12/29/2010  THE DAY OF THE PROCEDURE:  1.   No solid foods, milk or milk products are allowed after midnight the night before your procedure.  2.   Do not drink anything colored red or purple.  Avoid juices with pulp.  No orange juice.  3.  You may drink clear liquids until7:30AM  which is 2 hours before your procedure.                                                                                                CLEAR LIQUIDS INCLUDE: Water Jello Ice Popsicles Tea (sugar ok, no milk/cream) Powdered fruit flavored drinks Coffee (sugar ok, no milk/cream) Gatorade Juice: apple, white grape, white cranberry  Lemonade Clear bullion, consomm, broth Carbonated beverages (any kind) Strained chicken noodle soup Hard Candy   MEDICATION INSTRUCTIONS  Unless otherwise instructed, you should take regular prescription medications with a small sip of water as early as possible the morning of your procedure.  Diabetic patients - see separate instructions.             OTHER INSTRUCTIONS  You will need a responsible adult at least 55 years of age to accompany you and drive you home.   This person must remain in the waiting room during your procedure.  Wear loose fitting clothing that is easily removed.  Leave jewelry and other valuables at home.  However, you may wish to bring a book to read or an iPod/MP3 player to listen to music as you wait for your procedure to start.  Remove all body piercing jewelry and leave at home.  Total time from sign-in until discharge is  approximately 2-3 hours.  You should go home directly after your procedure and rest.  You can resume normal activities the day after your procedure.  The day of your procedure you should not:   Drive   Make legal decisions   Operate machinery   Drink alcohol   Return to work  You will receive specific instructions about eating, activities and medications before you leave.    The above instructions have been reviewed and explained to me by   _______________________    I fully understand and can verbalize these instructions _____________________________ Date _________

## 2010-12-27 NOTE — Letter (Signed)
Summary: Diabetic Instructions  [facility]  [address] [place], [place] [address]   Phone: [phone]  Fax: [phone]    Chelsea Lester Oct 13, 1956 MRN: [id number]   X    ORAL DIABETIC MEDICATION INSTRUCTIONS  The day before your procedure:   Take your diabetic pill as you do normally  The day of your procedure:   Do not take your diabetic pill    We will check your blood sugar levels during the admission process and again in Recovery before discharging you home  ________________________________________________________________________   X   INSULIN (LONG ACTING) MEDICATION INSTRUCTIONS (Lantus, NPH, 70/30, Humulin, Novolin-N)   The day before your procedure:   Take  your regular evening dose    The day of your procedure:   Do not take your morning dose    _  _   INSULIN (SHORT ACTING) MEDICATION INSTRUCTIONS (Regular, Humulog, Novolog)   The day before your procedure:   Do not take your evening dose   The day of your procedure:   Do not take your morning dose   _  _   INSULIN PUMP MEDICATION INSTRUCTIONS  We will contact the physician managing your diabetic care for written dosage instructions for the day before your procedure and the day of your procedure.  Once we have received the instructions, we will contact you.

## 2010-12-29 ENCOUNTER — Encounter: Payer: Medicaid Other | Admitting: Gastroenterology

## 2010-12-29 ENCOUNTER — Encounter: Payer: Self-pay | Admitting: Gastroenterology

## 2010-12-29 ENCOUNTER — Other Ambulatory Visit: Payer: Self-pay | Admitting: Gastroenterology

## 2010-12-29 DIAGNOSIS — K2940 Chronic atrophic gastritis without bleeding: Secondary | ICD-10-CM

## 2010-12-29 LAB — GLUCOSE, CAPILLARY
Glucose-Capillary: 179 mg/dL — ABNORMAL HIGH (ref 70–99)
Glucose-Capillary: 183 mg/dL — ABNORMAL HIGH (ref 70–99)

## 2011-01-02 ENCOUNTER — Encounter: Payer: Self-pay | Admitting: Gastroenterology

## 2011-01-02 NOTE — Miscellaneous (Signed)
  Clinical Lists Changes  Medications: Added new medication of PEPCID 40 MG  TABS (FAMOTIDINE) take 1 tab once daily - Signed Rx of PEPCID 40 MG  TABS (FAMOTIDINE) take 1 tab once daily;  #30 x 1;  Signed;  Entered by: Louis Meckel MD;  Authorized by: Louis Meckel MD;  Method used: Electronically to [facility] [address] [place], [place]  [id number], Ph: [phone], Fax: [phone]    Prescriptions: PEPCID 40 MG  TABS (FAMOTIDINE) take 1 tab once daily  #30 x 1   Entered and Authorized by:   Louis Meckel MD   Signed by:   Louis Meckel MD on 12/29/2010   Method used:   Electronically to        [facility] (retail)       [address]       [place], [place]  [id number]       Ph: [phone]       Fax: [phone]   RxID:   [id number]

## 2011-01-02 NOTE — Procedures (Addendum)
Summary: Upper Endoscopy  Patient: Chelsea Lester Note: All result statuses are Final unless otherwise noted.  Tests: (1) Upper Endoscopy (EGD)   EGD Upper Endoscopy       DONE     [facility]     [address]     [place], [place]  [address]           ENDOSCOPY PROCEDURE REPORT           PATIENT:  Chelsea Lester, Chelsea Lester  MR#:  [id number]     BIRTHDATE:  1956/03/28, 55 yrs. old  GENDER:  female           ENDOSCOPIST:  Barbette Hair. Arlyce Dice, MD     Referred by:  Manning Charity, M.D.           PROCEDURE DATE:  12/29/2010     PROCEDURE:  EGD with biopsy, [address], Maloney Dilation of Esophagus     ASA CLASS:  Class II     INDICATIONS:  dysphagia           MEDICATIONS:   Fentanyl 25 mcg IV, Versed 5 mg IV, glycopyrrolate     (Robinal) 0.2 mg IV, 0.6cc simethancone 0.6 cc PO     TOPICAL ANESTHETIC:  Exactacain Spray           DESCRIPTION OF PROCEDURE:   After the risks benefits and     alternatives of the procedure were thoroughly explained, informed     consent was obtained.  The [id number] endoscope was     introduced through the mouth and advanced to the gastroesophageal     junction, without limitations.  The instrument was slowly     withdrawn as the mucosa was fully examined.     <<PROCEDUREIMAGES>>           A stricture was found at the gastroesophageal junction (see     image1). Early esophageal stricture Dilation with maloney dilator     18mm Mild resistance; no heme  Moderate gastritis was found.     Enlarged gastric folds with erosions in antrum. Bxs taken (see     image5).  Otherwise the examination was normal (see image3,     image7, and image6).    Retroflexed views revealed no     abnormalities.    The scope was then withdrawn from the patient     and the procedure completed.           COMPLICATIONS:  None           ENDOSCOPIC IMPRESSION:     1) Stricture at the gastroesophageal junction - s/p maloney     dilitation     2) Moderate gastritis     3)  Otherwise normal examination     RECOMMENDATIONS:     1) dilatations PRN     2) Await biopsy results     3) Pepcid 20 mg qd           REPEAT EXAM:  No           ______________________________     Barbette Hair. Arlyce Dice, MD           CC:           n.     eSIGNED:   Barbette Hair. Stylianos Stradling at 12/29/2010 10:15 AM           Royanne Foots, [id number]  Note: An exclamation mark (!) indicates a result that was not dispersed into the flowsheet. Document Creation  Date: 12/29/2010 10:15 AM _______________________________________________________________________  (1) Order result status: Final Collection or observation date-time: 12/29/2010 10:07 Requested date-time:  Receipt date-time:  Reported date-time:  Referring Physician:   Ordering Physician: Melvia Heaps [phone]) Specimen Source:  Source: Launa Grill Order Number: [id number] Lab site:

## 2011-01-11 NOTE — Letter (Signed)
Summary: Results Letter  [facility]  [address] [place], [place] [address]   Phone: [phone]  Fax: [phone]        January 02, 2011 MRN: [id number]    Chelsea Lester [address] [place], [place]  [address]    Dear Ms. Kobler,   Your biopsies demonstrated inflammatory changes only.    Please follow the recommendations previously discussed.  Should you have any immediate concerns or questions, feel free to contact me at the office.    Sincerely,  Barbette Hair. Arlyce Dice, M.D., [facility]          Sincerely,  Louis Meckel MD  This letter has been electronically signed by your physician.  Appended Document: Results Letter letter mailed

## 2011-01-15 LAB — POCT CARDIAC MARKERS
CKMB, poc: 1.4 ng/mL (ref 1.0–8.0)
CKMB, poc: 2.8 ng/mL (ref 1.0–8.0)
Myoglobin, poc: 84.5 ng/mL (ref 12–200)
Troponin i, poc: <0.05 ng/mL (ref 0.00–0.09)

## 2011-01-15 LAB — GLUCOSE, CAPILLARY
Glucose-Capillary: 108 mg/dL — ABNORMAL HIGH (ref 70–99)
Glucose-Capillary: 130 mg/dL — ABNORMAL HIGH (ref 70–99)
Glucose-Capillary: 141 mg/dL — ABNORMAL HIGH (ref 70–99)
Glucose-Capillary: 150 mg/dL — ABNORMAL HIGH (ref 70–99)

## 2011-01-15 LAB — CBC
HCT: 31.4 % — ABNORMAL LOW (ref 36.0–46.0)
HCT: 32.5 % — ABNORMAL LOW (ref 36.0–46.0)
Hemoglobin: 10.3 g/dL — ABNORMAL LOW (ref 12.0–15.0)
MCH: 22.8 pg — ABNORMAL LOW (ref 26.0–34.0)
MCHC: 31.8 g/dL (ref 30.0–36.0)
MCV: 71.4 fL — ABNORMAL LOW (ref 78.0–100.0)
RBC: 4.55 MIL/uL (ref 3.87–5.11)
RDW: 18.8 % — ABNORMAL HIGH (ref 11.5–15.5)
WBC: 6.9 10*3/uL (ref 4.0–10.5)

## 2011-01-15 LAB — DIFFERENTIAL
Basophils Absolute: 0 10*3/uL (ref 0.0–0.1)
Basophils Relative: 0 % (ref 0–1)
Eosinophils Relative: 1 % (ref 0–5)
Lymphocytes Relative: 45 % (ref 12–46)
Lymphocytes Relative: 52 % — ABNORMAL HIGH (ref 12–46)
Lymphs Abs: 3.1 10*3/uL (ref 0.7–4.0)
Monocytes Absolute: 0.3 10*3/uL (ref 0.1–1.0)
Monocytes Relative: 6 % (ref 3–12)
Neutro Abs: 2.2 10*3/uL (ref 1.7–7.7)
Neutro Abs: 3.4 10*3/uL (ref 1.7–7.7)
Neutrophils Relative %: 49 % (ref 43–77)

## 2011-01-15 LAB — LACTIC ACID, PLASMA: Lactic Acid, Venous: 0.9 mmol/L (ref 0.5–2.2)

## 2011-01-15 LAB — BASIC METABOLIC PANEL
BUN: 11 mg/dL (ref 6–23)
CO2: 32 mEq/L (ref 19–32)
Calcium: 8.4 mg/dL (ref 8.4–10.5)
GFR calc non Af Amer: >60 mL/min (ref >60)
Glucose, Bld: 139 mg/dL — ABNORMAL HIGH (ref 70–99)

## 2011-01-15 LAB — CK TOTAL AND CKMB (NOT AT ARMC)
CK, MB: 2.2 ng/mL (ref 0.3–4.0)
CK, MB: 2.7 ng/mL (ref 0.3–4.0)
CK, MB: 2.8 ng/mL (ref 0.3–4.0)
Relative Index: 0.9 (ref 0.0–2.5)
Relative Index: 0.9 (ref 0.0–2.5)
Total CK: 285 U/L — ABNORMAL HIGH (ref 7–177)
Total CK: 312 U/L — ABNORMAL HIGH (ref 7–177)
Total CK: 314 U/L — ABNORMAL HIGH (ref 7–177)

## 2011-01-15 LAB — URINE MICROSCOPIC-ADD ON

## 2011-01-15 LAB — URINALYSIS, ROUTINE W REFLEX MICROSCOPIC
Glucose, UA: NEGATIVE mg/dL
Leukocytes, UA: NEGATIVE
Nitrite: NEGATIVE
Protein, ur: 100 mg/dL — AB
Urobilinogen, UA: 1 mg/dL (ref 0.0–1.0)

## 2011-01-15 LAB — COMPREHENSIVE METABOLIC PANEL
ALT: 20 U/L (ref 0–35)
Alkaline Phosphatase: 56 U/L (ref 39–117)
BUN: 11 mg/dL (ref 6–23)
CO2: 31 mEq/L (ref 19–32)
Chloride: 102 mEq/L (ref 96–112)
Glucose, Bld: 166 mg/dL — ABNORMAL HIGH (ref 70–99)
Potassium: 3.1 mEq/L — ABNORMAL LOW (ref 3.5–5.1)
Sodium: 140 mEq/L (ref 135–145)
Total Bilirubin: 0.3 mg/dL (ref 0.3–1.2)
Total Protein: 7.1 g/dL (ref 6.0–8.3)

## 2011-01-15 LAB — POCT I-STAT, CHEM 8
Calcium, Ion: 1.01 mmol/L — ABNORMAL LOW (ref 1.12–1.32)
Glucose, Bld: 195 mg/dL — ABNORMAL HIGH (ref 70–99)
HCT: 37 % (ref 36.0–46.0)
Hemoglobin: 12.6 g/dL (ref 12.0–15.0)
TCO2: 30 mmol/L (ref 0–100)

## 2011-01-15 LAB — LIPID PANEL
Cholesterol: 120 mg/dL (ref 0–200)
HDL: 38 mg/dL — ABNORMAL LOW (ref >39)

## 2011-01-15 LAB — APTT: aPTT: 26 seconds (ref 24–37)

## 2011-01-15 LAB — FOLATE: Folate: 11.4 ng/mL

## 2011-01-15 LAB — MRSA PCR SCREENING: MRSA by PCR: NEGATIVE

## 2011-01-15 LAB — IRON AND TIBC
Saturation Ratios: 19 % — ABNORMAL LOW (ref 20–55)
UIBC: 214 ug/dL

## 2011-01-15 LAB — TSH: TSH: 0.839 u[IU]/mL (ref 0.350–4.500)

## 2011-01-15 LAB — TROPONIN I
Troponin I: 0.01 ng/mL (ref 0.00–0.06)
Troponin I: 0.02 ng/mL (ref 0.00–0.06)
Troponin I: 0.02 ng/mL (ref 0.00–0.06)

## 2011-01-15 LAB — D-DIMER, QUANTITATIVE: D-Dimer, Quant: 0.33 ug/mL-FEU (ref 0.00–0.48)

## 2011-01-15 LAB — BRAIN NATRIURETIC PEPTIDE: Pro B Natriuretic peptide (BNP): <30 pg/mL (ref 0.0–100.0)

## 2011-01-15 LAB — RAPID URINE DRUG SCREEN, HOSP PERFORMED: Tetrahydrocannabinol: NOT DETECTED

## 2011-01-16 LAB — CK TOTAL AND CKMB (NOT AT ARMC)
CK, MB: 2.1 ng/mL (ref 0.3–4.0)
Relative Index: 1.2 (ref 0.0–2.5)

## 2011-01-16 LAB — CARDIAC PANEL(CRET KIN+CKTOT+MB+TROPI)
CK, MB: 1.9 ng/mL (ref 0.3–4.0)
Relative Index: 1.2 (ref 0.0–2.5)
Relative Index: 1.2 (ref 0.0–2.5)
Troponin I: 0.01 ng/mL (ref 0.00–0.06)
Troponin I: 0.01 ng/mL (ref 0.00–0.06)

## 2011-01-16 LAB — PROTIME-INR
INR: 0.87 (ref 0.00–1.49)
Prothrombin Time: 12 seconds (ref 11.6–15.2)

## 2011-01-16 LAB — APTT: aPTT: 26 seconds (ref 24–37)

## 2011-01-16 LAB — DIFFERENTIAL
Eosinophils Relative: 1 % (ref 0–5)
Lymphocytes Relative: 42 % (ref 12–46)
Monocytes Absolute: 0.4 10*3/uL (ref 0.1–1.0)
Monocytes Relative: 5 % (ref 3–12)
Neutro Abs: 4.2 10*3/uL (ref 1.7–7.7)

## 2011-01-16 LAB — CBC
HCT: 31.1 % — ABNORMAL LOW (ref 36.0–46.0)
MCH: 22.7 pg — ABNORMAL LOW (ref 26.0–34.0)
MCV: 71.2 fL — ABNORMAL LOW (ref 78.0–100.0)
MCV: 72.8 fL — ABNORMAL LOW (ref 78.0–100.0)
Platelets: 248 10*3/uL (ref 150–400)
RBC: 4.27 MIL/uL (ref 3.87–5.11)
RBC: 5.17 MIL/uL — ABNORMAL HIGH (ref 3.87–5.11)
RDW: 19.1 % — ABNORMAL HIGH (ref 11.5–15.5)
RDW: 19.3 % — ABNORMAL HIGH (ref 11.5–15.5)
WBC: 6.6 10*3/uL (ref 4.0–10.5)
WBC: 8.2 10*3/uL (ref 4.0–10.5)

## 2011-01-16 LAB — GLUCOSE, CAPILLARY
Glucose-Capillary: 131 mg/dL — ABNORMAL HIGH (ref 70–99)
Glucose-Capillary: 135 mg/dL — ABNORMAL HIGH (ref 70–99)
Glucose-Capillary: 140 mg/dL — ABNORMAL HIGH (ref 70–99)

## 2011-01-16 LAB — LIPID PANEL
Total CHOL/HDL Ratio: 2.3 RATIO
VLDL: 22 mg/dL (ref 0–40)

## 2011-01-16 LAB — BASIC METABOLIC PANEL
BUN: 8 mg/dL (ref 6–23)
CO2: 32 mEq/L (ref 19–32)
Chloride: 99 mEq/L (ref 96–112)
Potassium: 3.8 mEq/L (ref 3.5–5.1)

## 2011-01-16 LAB — URINE MICROSCOPIC-ADD ON

## 2011-01-16 LAB — COMPREHENSIVE METABOLIC PANEL
AST: 21 U/L (ref 0–37)
Albumin: 3.8 g/dL (ref 3.5–5.2)
Alkaline Phosphatase: 56 U/L (ref 39–117)
BUN: 9 mg/dL (ref 6–23)
Creatinine, Ser: 0.75 mg/dL (ref 0.4–1.2)
GFR calc Af Amer: >60 mL/min (ref >60)
Potassium: 4.1 mEq/L (ref 3.5–5.1)
Total Protein: 7.2 g/dL (ref 6.0–8.3)

## 2011-01-16 LAB — URINALYSIS, ROUTINE W REFLEX MICROSCOPIC
Bilirubin Urine: NEGATIVE
Glucose, UA: NEGATIVE mg/dL
Hgb urine dipstick: NEGATIVE
Ketones, ur: NEGATIVE mg/dL
Nitrite: NEGATIVE
Specific Gravity, Urine: 1.029 (ref 1.005–1.030)
pH: 5.5 (ref 5.0–8.0)

## 2011-01-16 LAB — POCT CARDIAC MARKERS
CKMB, poc: 2.1 ng/mL (ref 1.0–8.0)
Troponin i, poc: <0.05 ng/mL (ref 0.00–0.09)

## 2011-01-16 LAB — TROPONIN I: Troponin I: 0.01 ng/mL (ref 0.00–0.06)

## 2011-01-16 LAB — MRSA PCR SCREENING: MRSA by PCR: NEGATIVE

## 2011-01-16 LAB — HEMOGLOBIN A1C: Hgb A1c MFr Bld: 8.1 % — ABNORMAL HIGH (ref <5.7)

## 2011-01-18 LAB — DIFFERENTIAL
Basophils Relative: 0 % (ref 0–1)
Eosinophils Absolute: 0.1 10*3/uL (ref 0.0–0.7)
Eosinophils Relative: 1 % (ref 0–5)
Monocytes Absolute: 0.4 10*3/uL (ref 0.1–1.0)
Monocytes Relative: 5 % (ref 3–12)

## 2011-01-18 LAB — BASIC METABOLIC PANEL
BUN: 11 mg/dL (ref 6–23)
CO2: 24 mEq/L (ref 19–32)
Glucose, Bld: 343 mg/dL — ABNORMAL HIGH (ref 70–99)
Potassium: 3.9 mEq/L (ref 3.5–5.1)
Sodium: 137 mEq/L (ref 135–145)

## 2011-01-18 LAB — CBC
HCT: 33.2 % — ABNORMAL LOW (ref 36.0–46.0)
Hemoglobin: 10.6 g/dL — ABNORMAL LOW (ref 12.0–15.0)
MCH: 22.8 pg — ABNORMAL LOW (ref 26.0–34.0)
MCHC: 31.9 g/dL (ref 30.0–36.0)

## 2011-01-22 LAB — GLUCOSE, CAPILLARY: Glucose-Capillary: 107 mg/dL — ABNORMAL HIGH (ref 70–99)

## 2011-01-29 ENCOUNTER — Other Ambulatory Visit: Payer: Self-pay | Admitting: Gastroenterology

## 2011-02-07 LAB — GLUCOSE, CAPILLARY
Glucose-Capillary: 132 mg/dL — ABNORMAL HIGH (ref 70–99)
Glucose-Capillary: 139 mg/dL — ABNORMAL HIGH (ref 70–99)
Glucose-Capillary: 145 mg/dL — ABNORMAL HIGH (ref 70–99)
Glucose-Capillary: 151 mg/dL — ABNORMAL HIGH (ref 70–99)
Glucose-Capillary: 151 mg/dL — ABNORMAL HIGH (ref 70–99)
Glucose-Capillary: 158 mg/dL — ABNORMAL HIGH (ref 70–99)
Glucose-Capillary: 162 mg/dL — ABNORMAL HIGH (ref 70–99)
Glucose-Capillary: 179 mg/dL — ABNORMAL HIGH (ref 70–99)
Glucose-Capillary: 185 mg/dL — ABNORMAL HIGH (ref 70–99)
Glucose-Capillary: 218 mg/dL — ABNORMAL HIGH (ref 70–99)

## 2011-02-07 LAB — BASIC METABOLIC PANEL
BUN: 15 mg/dL (ref 6–23)
BUN: 9 mg/dL (ref 6–23)
CO2: 30 mEq/L (ref 19–32)
Calcium: 9 mg/dL (ref 8.4–10.5)
Chloride: 101 mEq/L (ref 96–112)
Chloride: 104 mEq/L (ref 96–112)
Creatinine, Ser: 0.8 mg/dL (ref 0.4–1.2)
GFR calc non Af Amer: 57 mL/min — ABNORMAL LOW (ref >60)
GFR calc non Af Amer: >60 mL/min (ref >60)
Glucose, Bld: 150 mg/dL — ABNORMAL HIGH (ref 70–99)
Potassium: 3.7 mEq/L (ref 3.5–5.1)
Sodium: 138 mEq/L (ref 135–145)

## 2011-02-07 LAB — CBC
HCT: 32.3 % — ABNORMAL LOW (ref 36.0–46.0)
HCT: 33.3 % — ABNORMAL LOW (ref 36.0–46.0)
Hemoglobin: 10.6 g/dL — ABNORMAL LOW (ref 12.0–15.0)
Hemoglobin: 11 g/dL — ABNORMAL LOW (ref 12.0–15.0)
MCHC: 32.7 g/dL (ref 30.0–36.0)
MCHC: 33 g/dL (ref 30.0–36.0)
MCV: 66.1 fL — ABNORMAL LOW (ref 78.0–100.0)
MCV: 67 fL — ABNORMAL LOW (ref 78.0–100.0)
Platelets: 322 10*3/uL (ref 150–400)
Platelets: 394 10*3/uL (ref 150–400)
RBC: 4.88 MIL/uL (ref 3.87–5.11)
RBC: 4.97 MIL/uL (ref 3.87–5.11)
RDW: 20.6 % — ABNORMAL HIGH (ref 11.5–15.5)
RDW: 21 % — ABNORMAL HIGH (ref 11.5–15.5)
WBC: 6.4 10*3/uL (ref 4.0–10.5)
WBC: 7.3 10*3/uL (ref 4.0–10.5)

## 2011-02-07 LAB — LIPID PANEL
Cholesterol: 155 mg/dL (ref 0–200)
HDL: 35 mg/dL — ABNORMAL LOW (ref >39)
Total CHOL/HDL Ratio: 4.4 RATIO
VLDL: 34 mg/dL (ref 0–40)

## 2011-02-07 LAB — DIFFERENTIAL
Basophils Absolute: 0 10*3/uL (ref 0.0–0.1)
Basophils Relative: 0 % (ref 0–1)
Eosinophils Absolute: 0.1 10*3/uL (ref 0.0–0.7)
Eosinophils Relative: 1 % (ref 0–5)
Lymphocytes Relative: 40 % (ref 12–46)
Lymphs Abs: 2.9 10*3/uL (ref 0.7–4.0)
Monocytes Absolute: 0.4 10*3/uL (ref 0.1–1.0)
Monocytes Relative: 5 % (ref 3–12)
Neutro Abs: 3.9 10*3/uL (ref 1.7–7.7)
Neutrophils Relative %: 54 % (ref 43–77)

## 2011-02-07 LAB — CULTURE, BLOOD (ROUTINE X 2): Culture: NO GROWTH

## 2011-02-07 LAB — HEPATIC FUNCTION PANEL
ALT: 16 U/L (ref 0–35)
AST: 8 U/L (ref 0–37)
Alkaline Phosphatase: 68 U/L (ref 39–117)
Bilirubin, Direct: <0.1 mg/dL (ref 0.0–0.3)
Total Protein: 8.3 g/dL (ref 6.0–8.3)

## 2011-02-07 LAB — HEMOGLOBIN A1C
Hgb A1c MFr Bld: 8.2 % — ABNORMAL HIGH (ref 4.6–6.1)
Mean Plasma Glucose: 189 mg/dL

## 2011-02-08 LAB — COMPREHENSIVE METABOLIC PANEL
AST: 8 U/L (ref 0–37)
Albumin: 3.6 g/dL (ref 3.5–5.2)
Alkaline Phosphatase: 64 U/L (ref 39–117)
BUN: 7 mg/dL (ref 6–23)
CO2: 33 mEq/L — ABNORMAL HIGH (ref 19–32)
Chloride: 99 mEq/L (ref 96–112)
GFR calc Af Amer: >60 mL/min (ref >60)
GFR calc non Af Amer: >60 mL/min (ref >60)
Potassium: 3.2 mEq/L — ABNORMAL LOW (ref 3.5–5.1)
Total Bilirubin: 0.5 mg/dL (ref 0.3–1.2)

## 2011-02-08 LAB — CBC
HCT: 33.5 % — ABNORMAL LOW (ref 36.0–46.0)
MCV: 67.2 fL — ABNORMAL LOW (ref 78.0–100.0)
Platelets: 220 10*3/uL (ref 150–400)
RBC: 4.99 MIL/uL (ref 3.87–5.11)
WBC: 9.6 10*3/uL (ref 4.0–10.5)

## 2011-02-08 LAB — POCT CARDIAC MARKERS: CKMB, poc: <1 ng/mL — ABNORMAL LOW (ref 1.0–8.0)

## 2011-02-08 LAB — DIFFERENTIAL
Basophils Absolute: 0 10*3/uL (ref 0.0–0.1)
Basophils Relative: 0 % (ref 0–1)
Eosinophils Relative: 0 % (ref 0–5)
Monocytes Absolute: 0.5 10*3/uL (ref 0.1–1.0)

## 2011-02-08 LAB — POCT I-STAT, CHEM 8
Calcium, Ion: 1.07 mmol/L — ABNORMAL LOW (ref 1.12–1.32)
HCT: 36 % (ref 36.0–46.0)
Hemoglobin: 12.2 g/dL (ref 12.0–15.0)
Sodium: 142 mEq/L (ref 135–145)
TCO2: 36 mmol/L (ref 0–100)

## 2011-02-08 LAB — BRAIN NATRIURETIC PEPTIDE: Pro B Natriuretic peptide (BNP): <30 pg/mL (ref 0.0–100.0)

## 2011-02-12 LAB — CULTURE, ROUTINE-ABSCESS

## 2011-02-12 LAB — GLUCOSE, CAPILLARY: Glucose-Capillary: 104 mg/dL — ABNORMAL HIGH (ref 70–99)

## 2011-02-13 LAB — GLUCOSE, CAPILLARY
Glucose-Capillary: 108 mg/dL — ABNORMAL HIGH (ref 70–99)
Glucose-Capillary: 126 mg/dL — ABNORMAL HIGH (ref 70–99)
Glucose-Capillary: 126 mg/dL — ABNORMAL HIGH (ref 70–99)
Glucose-Capillary: 154 mg/dL — ABNORMAL HIGH (ref 70–99)
Glucose-Capillary: 166 mg/dL — ABNORMAL HIGH (ref 70–99)
Glucose-Capillary: 238 mg/dL — ABNORMAL HIGH (ref 70–99)
Glucose-Capillary: 48 mg/dL — ABNORMAL LOW (ref 70–99)
Glucose-Capillary: 70 mg/dL (ref 70–99)
Glucose-Capillary: 81 mg/dL (ref 70–99)
Glucose-Capillary: 85 mg/dL (ref 70–99)

## 2011-02-13 LAB — CBC
HCT: 30.1 % — ABNORMAL LOW (ref 36.0–46.0)
HCT: 33.1 % — ABNORMAL LOW (ref 36.0–46.0)
Hemoglobin: 10.7 g/dL — ABNORMAL LOW (ref 12.0–15.0)
MCHC: 32.3 g/dL (ref 30.0–36.0)
MCV: 66.6 fL — ABNORMAL LOW (ref 78.0–100.0)
Platelets: 216 10*3/uL (ref 150–400)
RBC: 4.45 MIL/uL (ref 3.87–5.11)
RBC: 4.51 MIL/uL (ref 3.87–5.11)
RBC: 4.99 MIL/uL (ref 3.87–5.11)
RDW: 20.8 % — ABNORMAL HIGH (ref 11.5–15.5)
WBC: 6.1 10*3/uL (ref 4.0–10.5)
WBC: 7.1 10*3/uL (ref 4.0–10.5)

## 2011-02-13 LAB — CULTURE, BLOOD (ROUTINE X 2)

## 2011-02-13 LAB — IRON AND TIBC
Iron: 38 ug/dL — ABNORMAL LOW (ref 42–135)
Saturation Ratios: 15 % — ABNORMAL LOW (ref 20–55)
UIBC: 211 ug/dL

## 2011-02-13 LAB — COMPREHENSIVE METABOLIC PANEL
ALT: <8 U/L (ref 0–35)
Alkaline Phosphatase: 67 U/L (ref 39–117)
BUN: 8 mg/dL (ref 6–23)
CO2: 33 mEq/L — ABNORMAL HIGH (ref 19–32)
GFR calc non Af Amer: >60 mL/min (ref >60)
Glucose, Bld: 110 mg/dL — ABNORMAL HIGH (ref 70–99)
Potassium: 3.1 mEq/L — ABNORMAL LOW (ref 3.5–5.1)
Sodium: 140 mEq/L (ref 135–145)

## 2011-02-13 LAB — HEMOCCULT GUIAC POC 1CARD (OFFICE): Fecal Occult Bld: POSITIVE

## 2011-02-13 LAB — DIFFERENTIAL
Basophils Absolute: 0 10*3/uL (ref 0.0–0.1)
Eosinophils Absolute: 0.1 10*3/uL (ref 0.0–0.7)
Lymphocytes Relative: 33 % (ref 12–46)
Monocytes Absolute: 0.3 10*3/uL (ref 0.1–1.0)
Neutrophils Relative %: 62 % (ref 43–77)
Smear Review: ADEQUATE

## 2011-02-13 LAB — BASIC METABOLIC PANEL
BUN: 8 mg/dL (ref 6–23)
Chloride: 102 mEq/L (ref 96–112)
GFR calc Af Amer: >60 mL/min (ref >60)
Potassium: 3.7 mEq/L (ref 3.5–5.1)

## 2011-02-13 LAB — FERRITIN: Ferritin: 27 ng/mL (ref 10–291)

## 2011-02-13 LAB — AMYLASE: Amylase: 54 U/L (ref 27–131)

## 2011-02-13 LAB — VITAMIN B12: Vitamin B-12: 303 pg/mL (ref 211–911)

## 2011-02-19 LAB — HERPES SIMPLEX VIRUS CULTURE

## 2011-02-20 LAB — DIFFERENTIAL
Basophils Relative: 0 % (ref 0–1)
Eosinophils Absolute: 0.1 10*3/uL (ref 0.0–0.7)
Lymphocytes Relative: 40 % (ref 12–46)
Lymphs Abs: 3.3 10*3/uL (ref 0.7–4.0)
Neutro Abs: 4.5 10*3/uL (ref 1.7–7.7)

## 2011-02-20 LAB — CBC
HCT: 34.8 % — ABNORMAL LOW (ref 36.0–46.0)
Hemoglobin: 11.3 g/dL — ABNORMAL LOW (ref 12.0–15.0)
MCHC: 32.4 g/dL (ref 30.0–36.0)
MCV: 67.7 fL — ABNORMAL LOW (ref 78.0–100.0)
RDW: 20.3 % — ABNORMAL HIGH (ref 11.5–15.5)

## 2011-02-20 LAB — POCT I-STAT, CHEM 8
Chloride: 103 mEq/L (ref 96–112)
Creatinine, Ser: 1.1 mg/dL (ref 0.4–1.2)
Glucose, Bld: 65 mg/dL — ABNORMAL LOW (ref 70–99)
Hemoglobin: 12.9 g/dL (ref 12.0–15.0)
Potassium: 3.9 mEq/L (ref 3.5–5.1)

## 2011-02-20 LAB — RAPID STREP SCREEN (MED CTR MEBANE ONLY): Streptococcus, Group A Screen (Direct): NEGATIVE

## 2011-02-20 LAB — POCT CARDIAC MARKERS
CKMB, poc: 2.4 ng/mL (ref 1.0–8.0)
Troponin i, poc: <0.05 ng/mL (ref 0.00–0.09)

## 2011-03-20 NOTE — Discharge Summary (Signed)
Chelsea Lester, Chelsea Lester              ACCOUNT NO.:  [id number]   MEDICAL RECORD NO.:  [id number]          PATIENT TYPE:  OBV   LOCATION:  6533                         FACILITY:  [facility]   PHYSICIAN:  Jason Coop, MD DATE OF BIRTH:  1956/08/25   DATE OF ADMISSION:  07/17/2007  DATE OF DISCHARGE:  07/18/2007                               DISCHARGE SUMMARY   DISCHARGE DIAGNOSES:  1. Atypical chest pain.  2. Hypertension.  3. Microcytic anemia.  4. Diabetes.  5. Obstructive sleep apnea.   Other diagnoses are inactive.   MEDICATIONS ON DISCHARGE:  1. Lasix 40 mg p.o. q.daily.  2. Advair inhaler b.i.d. per home regimen.  3. Aspirin 81 mg p.o. q.daily.  4. __________ 15 mg p.o. b.i.d.  5. Diovan 320 mg p.o. q.daily.  6. Simvastatin 40 mg p.o. q.daily.  7. Tricor 145 mg p.o. q.daily.  8. Novolin insulin 20 units in the a.m., 15 units in the p.m.  9. Hyoscyamine 0.375 b.i.d.  10.Ibuprofen 400 mg p.o. t.i.d.  11.Caduet 10-20 mg daily.  12.Omeprazole 20 mg p.o. b.i.d.  13.Vicodin 5/500 one tablet p.o. eight hourly p.r.n.  14.Loratadine 10 mg daily.   DISPOSITION:  Chelsea Lester is discharged home.  She will see Dr. Lyda Perone on  October 7.  She needs to follow up for any persisting chest pain.  She  also needs followup for her microcytic anemia which was consistent with  iron deficiency anemia.  She also needs to follow up for her other  issues with are inactive during this hospitalization.   PROCEDURES:  Chelsea Lester had a 2-D echo done on July 17, 2007 which  was positive for an ejection fraction of 60 to 65 with no left  ventricular regional wall motion abnormalities.  Thickness of left  ventricle was mildly increased.  The left ventricle was mildly dilated.   CONSULTS:  None.   HISTORY OF PRESENT ILLNESS:  Chelsea Lester is a 55 year old lady with a past  medical history of coronary artery disease, hypertension,  hyperlipidemia, asthma, presenting with history of feeling bad all  over,  with increased shortness of breath and cough.  She notes she has been  taking her home nebulizers several times daily over the last week.  When  she was getting her insulin together on the morning of the admission  day, when she developed sharp, left-sided chest tightness and shortness  of breath, nausea and diaphoresis.  She also had pain radiating down her  right arm.  The pain improved with some rest and also with nitro in the  ER.  She has had similar pain in the past with a previous cath in 2006  and 2007, indication of 20% occlusion of the left main artery.  She also  notes some diarrhea over the last several days with two episodes of  vomiting, fever, chills and weakness.   PHYSICAL EXAMINATION:  VITAL SIGNS:  Temperature 97.9, blood pressure  140/76, pulse 36, respirations 20, saturating on room air.  GENERAL:  She is obese, oriented and alert.  EYES:  Exophthalmus very prominent.  No oropharyngeal exudate.  NECK:  No thyromegaly or lymphadenopathy in the neck.  CHEST:  Bilateral basilar fine crackles with decreased air movement  globally, but without any wheezing and with normal respiration without  any distress.  HEART:  Rhythm regular with distant heart sounds.  ABDOMEN:  Bowel sounds positive.  Obese, soft, tender to palpation  mildly globally with no rebound or guarding.  EXTREMITIES:  Trace edema, 2+ bilateral dorsalis pedis pulses.  NEURO:  Intact.   LABS ON ADMISSION:  WBC 4.7, hemoglobin 10.7, platelets 343, sodium 142,  potassium 3, chloride 104, bicarb 30, BUN 13, creatinine 1.04, glucose  140, MCV 69.  AST 14, ALT 25, protein 6.8, albumin 3.6, calcium 8.9.   Chest x-ray without any acute issues.   PTT 25.  Point-of-care myoglobin 81.  CK-MB less than 3.  Troponin less  than 0.5.  D-dimer less than 0.22.   Urinalysis:  Nitrites negative, leukocyte esterase, protein, increased  urobilinogen, urobilin less than 4, pH 8.5.   EKG:  Normal sinus rhythm of 77  beats per minute with slightly prolonged  QT interval.   A repeat EKG after supplementation of potassium shows corrected QT  interval within normal range.   HOSPITAL COURSE:  1. Chest pain.  Cardiac enzymes were cycled which were negative x4.      EKG was repeated which was unchanged for any ischemic signs.  Echo      was negative for any new wall motion abnormalities with normal      ejection fraction.  Patient's pain decreased, no shortness of      breath.  2. Hypertension.  It was well controlled with her home medication.  3. Microcytic anemia.  Anemia panel was obtained which was consistent      with iron of 49, ferritin of 53, percent saturation decreased at      16, TIBC of 306.  Further workup for microcytic anemia is per her      primary care physician.  4. Diabetes mellitus.  Her hemoglobin A1c was 7.4 on July of 2008 and      a repeat hemoglobin A1c was obtained which was 7.6 and she was put      on sliding scale regimen and Lantus too and her CBG  was mildly      elevated during her hospital admission.   DISCHARGE LABS:  WBC 7.9, hemoglobin 9.3, platelet 315, RDW 28.5, MCV  69.3, sodium 114, potassium 3.5, but she was given K-Dur one tablet  given in the hospital and one tablet to take back at home on the day of  discharge.  Chloride 105, bicarb 31, BUN 12, creatinine 0.89, glucose  136, calcium 8.5, hemoglobin A1c 7.6.   DISCHARGE VITALS:  Temperature 97.8, pulse 69, respirations 18, blood  pressure 124/56, sating 96 on two liters.   CONDITION AT DISCHARGE:  Very minimal chest pain and shortness of  breath.      Jason Coop, MD  Electronically Signed     YP/MEDQ  D:  07/18/2007  T:  07/19/2007  Job:  [id number]   cc:   Manning Charity, MD

## 2011-03-20 NOTE — H&P (Signed)
Chelsea Lester, Chelsea Lester              ACCOUNT NO.:  [id number]   MEDICAL RECORD NO.:  [id number]          PATIENT TYPE:  INP   LOCATION:  5033                         FACILITY:  [facility]   PHYSICIAN:  Ricki Rodriguez, M.D.  DATE OF BIRTH:  02/12/1956   DATE OF ADMISSION:  03/09/2009  DATE OF DISCHARGE:                              HISTORY & PHYSICAL   CHIEF COMPLAINT:  Right foot swelling.   HISTORY OF PRESENT ILLNESS:  This 55 year old black female presented  with right foot and lower leg swelling of 1 week duration.  The patient  denies any injuries.  No fever.  No nausea or vomiting.  No chest pain.   PAST MEDICAL HISTORY:  Positive for diabetes, positive for hypertension,  negative for smoking, negative for alcohol use, positive for elevated  cholesterol level, negative for MI, positive for obesity, negative for  exercise, negative for family history of premature coronary artery  disease, positive for asthma and positive for degenerative joint  disease.   PAST SURGICAL HISTORY:  Hysterectomy.   MEDICATIONS:  1. Aspirin 81 mg one daily.  2. Metoprolol 50 mg one twice daily.  3. Novolin 70/30 50 units in the morning and 50 units in the evening.  4. Metformin 500 mg one twice daily.  5. Diovan 320 mg one daily.  6. TriCor 145 mg one daily.  7. Simvastatin 40 mg one daily.  8. Xanax 0.5 mg one at bedtime.  9. Hyoscyamine 0.375 mg one tablet twice daily.  10.Omeprazole 20 mg one twice daily.  11.Claritin 10 mg daily.  12.Darvocet-N 100 one 4 times daily.  13.Albuterol metered dose inhaler 2 puffs 4 times daily as needed.  14.Atrovent metered dose inhaler 2 puffs 4 times daily as needed.  15.Flonase 50 mcg 2 sprays once daily.  16.Advair Diskus 100/50 two puffs daily.   ALLERGIES:  No known drug allergies.   PERSONAL HISTORY:  The patient is divorced and has 2 children and lives  in [place].  She is unemployed.   FAMILY HISTORY:  The mother died at age 58 of breast cancer,  father died  at age 66 of lung cancer and MI.   REVIEW OF SYSTEMS:  Positive for weight gain, negative for fever,  positive for vision change, wearing glasses.  Negative for cataract  surgery, negative for hearing loss.  Negative for tinnitus, negative for  rhinorrhea, negative for denture.  Positive for sickle cell trait,  positive for chronic fatigue, negative for cough, hemoptysis, positive  for asthma, negative for COPD, negative for pneumonia.  Negative for  palpitations.  Positive for dizziness, positive for chest pain, negative  for edema, positive for hiatal hernia, negative for nausea, vomiting,  diarrhea, constipation, GI bleed, or ulcer.  Negative for hepatitis,  blood transfusion, kidney stones, or hematuria.  Negative for stroke,  seizures, or psychiatric admissions.  Positive for joint pains.  Negative for skin rashes.   PHYSICAL EXAMINATION:  VITAL SIGNS:  Pulse 67, respirations 14, blood  pressure 96/63, weight 293 pounds, height 5 feet 7 inches.  HEENT:  The patient is normocephalic, atraumatic.  Has brown  eyes,  somewhat prominent with the conjunctivae pink.  Sclerae nonicteric.  Pupils equal and reactive to light.  NECK:  No JVD, no carotid bruits.  She has full range of motion.  LUNGS:  Clear bilaterally.  HEART:  Normal S1, S2 with grade 2/6 systolic murmur at left sternal  border.  ABDOMEN:  Soft, nontender.  It is somewhat distended.  EXTREMITIES:  Edema 1+ of the right foot, trace edema of left lower  extremity.  There is tenderness over the base of the third and fourth  toes.  CNS:  Cranial nerves grossly intact.  Bilateral equal grips.  The  patient is right handed.   LABORATORY DATA:  Pending.   IMPRESSION:  Right foot cellulitis.   PLAN:  Start the patient on IV antibiotics, continue home medications,  check routine chemistry, blood cultures, and if necessary consider  surgical consult.      Ricki Rodriguez, M.D.  Electronically Signed      ASK/MEDQ  D:  03/09/2009  T:  03/10/2009  Job:  [id number]

## 2011-03-20 NOTE — Discharge Summary (Signed)
NAMELARETTA, PYATT              ACCOUNT NO.:  [id number]   MEDICAL RECORD NO.:  [id number]          PATIENT TYPE:  INP   LOCATION:  3728                         FACILITY:  [facility]   PHYSICIAN:  Ricki Rodriguez, M.D.  DATE OF BIRTH:  1956-04-18   DATE OF ADMISSION:  09/15/2008  DATE OF DISCHARGE:  09/16/2008                               DISCHARGE SUMMARY   FINAL DIAGNOSES:  1. Chest pain.  2. Mild two-vessel coronary artery disease.  3. Obesity.  4. Diabetes mellitus type 2.  5. Hyperlipidemia.  6. Hypertension.  7. Asthma.   DISCHARGE MEDICATIONS:  1. Aspirin 81 mg 1 daily.  2. Novolin 70/30, 50 units twice daily.  3. Metoprolol 50 mg twice daily.  4. TriCor 145 mg daily.  5. Xanax 0.5 mg at bedtime.  6. Omeprazole 20 mg twice daily.  7. Claritin 10 mg 1 daily.  8. Simvastatin 40 mg in the evening.  9. Hydrocodone APAP 5/500 mg 4 times daily as needed.  10.Albuterol metered-dose inhaler 2 puffs 4 times daily.  11.Atrovent inhaler 2 puffs 4 times daily.  12.Flonase 50 mcg 1 puff each nostril daily.  13.Diovan 320 mg daily.  14.Advair Diskus 100/50 as directed.  15.KCl, that is potassium pill 10 mEq 1 daily.   DISCHARGE DIET:  Low sodium, heart-healthy diet and carbohydrate  modified low-calorie diet.   WOUND CARE INSTRUCTION:  The patient is to notify right groin pain,  swelling, or discharge.   DISCHARGE ACTIVITIES:  The patient is to increase activity slowly and  she is to stop any activity that causes chest pain, shortness of breath,  dizziness, sweating, or excessive weakness.  The patient is to also  notify the primary care doctor at [facility]  that her coronaries have remained the same over the last 3 to 5 years  and she needs outpatient followup for her stroke, anemia and right  extremity pain.   FOLLOWUP:  By Dr. Orpah Cobb in one month.  The patient is to call on  [phone] for an appointment by primary care doctor and to [facility] in 1 to 2 weeks, and by GI physician at [facility] in 1 to 2 weeks.   HISTORY:  This 55 year old black female had recurrent chest pain for the  last couple of months.  She had known history of coronary artery disease  in the left main coronary artery.   PHYSICAL EXAMINATION:  VITAL SIGNS:  Pulse of 98, respirations 16, blood  pressure 140/101 with a BMI of 48, weight of 300 pounds, height of 5  feet 7 inches.  HEENT:  The patient is normocephalic, atraumatic, has brown eyes, pupils  equal and reacting to light, extraocular movement intact.  NECK:  No JVD.  No carotid bruits.  LUNGS:  Clear bilaterally.  HEART: Normal S1-S2 with a grade 2/6 systolic murmur at the left sternal  border.  ABDOMEN:  Soft and distended.  EXTREMITIES:  Trace edema, negative cyanosis or clubbing.  CENTRAL NERVOUS SYSTEM:  The patient moves all 4 extremities.  Cranial  nerves grossly intact.  LABORATORY DATA:  Slightly low hemoglobin of 9.9 with a normal WBC count  and platelet count.  CK-MB troponin I was negative x2.  Lipid profile  showed cholesterol of 137, triglyceride of 123, HDL cholesterol of 35  and LDL cholesterol of 77.  Her thyroid stimulating hormone level was  0.8.  Her potassium level was 3.2, kidney function was normal and sodium  was normal.   EKG showed a sinus rhythm.   Chest x-ray showed a low lung volume, otherwise unremarkable.   Cardiac catheterization showed minimal ostial left main disease, this  was unchanged from previous cardiac catheterization in 2007.   HOSPITAL COURSE:  The patient was admitted to telemetry unit.  Myocardial infarction was ruled out.  She underwent cardiac  catheterization that has failed to show any progression of her coronary  artery disease.  Her low potassium was treated with a potassium  supplementation and a prescription of daily potassium intake.  She was  advised to see [facility] for her anemia and she was also advised to see  her  primary care physician at [facility]  [facility] where she could go for additional investigations for her  possible stroke and right extremity weakness complaints.      Ricki Rodriguez, M.D.  Electronically Signed     ASK/MEDQ  D:  09/16/2008  T:  09/17/2008  Job:  [id number]

## 2011-03-20 NOTE — Discharge Summary (Signed)
Chelsea Lester, Chelsea Lester              ACCOUNT NO.:  [id number]   MEDICAL RECORD NO.:  [id number]          PATIENT TYPE:  INP   LOCATION:  2630                         FACILITY:  [facility]   PHYSICIAN:  Eliseo Gum, M.D.   DATE OF BIRTH:  May 21, 1956   DATE OF ADMISSION:  11/13/2007  DATE OF DISCHARGE:  11/14/2007                               DISCHARGE SUMMARY   DISCHARGE DIAGNOSES:  1. Atypical chest pain.  2. Hypotension secondary to volume depletion with a history of nausea      and vomiting as well as hyperglycemia.  3. Type 2 diabetes, poorly-controlled with hyperglycemia.  4. History of methicillin-resistant Staphylococcus aureus superficial      skin infections.  5. Hypertension.  6. Recurrent urinary tract infections.  7. History of asthma.  8. Hyperlipidemia.  9. Exophthalmos, proptosis in the setting of no known thyroid disease.  10.Depression, anxiety.  11.Iron-deficiency anemia.   DISCHARGE MEDICATIONS:  1. Albuterol MDI one to two puffs q.4h. p.r.n.  2. Atrovent MDI q.6h. p.r.n.  3. Advair MDI 250/50 mcg one puff b.i.d.  4. Aspirin 81 mg.  5. Metoprolol 50 mg p.o. b.i.d.  6. Insulin 70/30 40 units in the morning, 30 units in the evening.  7. Diovan 325 mg daily.  8. Tricor 145 mg p.o. daily.  9. Alprazolam 0.5 mg p.o. q.h.s.  10.Hyoscyamine sulfate 0.375 mg p.o. b.i.d.  11.Omeprazole 20 mg p.o. daily.  12.Claritin 10 mg p.o. daily p.r.n. for allergies.  13.Simvastatin 40 mg p.o. daily.  14.Darvocet-N 100 one tablet q.6h. p.r.n. for pain.   DISPOSITION AND FOLLOW-UP:  Chelsea Lester is being discharged in stable and  improved condition.  She has a follow-up appointment with Dr. Manning Lester in the outpatient clinic on January 15 at 3:30 p.m.  At the time of  follow-up, Chelsea Lester will need to have her home insulin regimen  addressed.  At discharge we increased her insulin by 5 units in the  morning and in the evening compared to her home dose.  She has been  hyperglycemic consistently throughout her admission.  She will also need  to be set up for an outpatient cardiology clinic visit in 1-2 weeks if  her chest pain continues or if she has new symptoms.  She is currently  on optimal medical management for her CAD including aspirin and beta  blocker.  It was deemed that there was no urgency for inpatient cardiac  testing.  We did repeat an echocardiogram, which showed some diastolic  dysfunction.  At follow-up she will need her blood culture data  reviewed.  She will need to be assessed for any continued nausea or  vomiting and checked for orthostatic postural hypotension.   BRIEF ADMITTING H&P:  Admission vital signs:  Temperature 98.5, blood  pressure 100/60, heart rate 77, respirations 18, O2 saturations 99% on  room air.   Admission laboratories:  Sodium 135, potassium 3.9, chloride 95, bicarb  30, BUN 16, creatinine 1.0, glucose 628, anion gap 10, calcium 9.3.  Hemoglobin 11.3, white blood cells 6.6, platelets 273.   Chelsea Lester is a  55 year old woman with coronary artery disease  status post percutaneous intervention and catheterization in 2006, who  presented to the [facility] emergency department complaining of chest  pain.  She has a known history of type 2 diabetes, hyperlipidemia and  hypertension.  She complained of 2-4 days of chest pain, intermittent,  sometimes 10/10.  The pain would last 15-30 minutes and usually resolved  on its own.  The pain was in the region of the left upper chest.  It was  not induced by exertion or position.  There was no associated  diaphoresis, shortness of breath.  For the past 1-2 days she had had  some nausea and vomiting and decreased blood glucose levels.  She did  endorse urinary frequency, dysuria and some new cough symptoms.  She  states that the chest pain is minimum and chronic.  She is better at the  time she is admitted.  Medicine teaching service was asked to admit for  rule-out  of any life-threatening causes of chest pain.   PROCEDURES:  None.   DIAGNOSTIC TESTS:  1. Complete abdominal ultrasound, November 14, 2007, showed probable      fatty infiltration of the liver, no gallstones, a normal common      bile duct, no hydronephrosis, no aortic aneurysm.  2. EKG:  Some T-wave inversion noted in the lateral leads and leads I,      aVL and V6.  These EKG findings are similar to old EKGs and      consistent with her history of left main stenosis in September      2007.  3. 2-D echo was not repeated.  She has a 2-D echo dated July 17, 2007.  This shows an ejection fraction of 60-65% with no regional      wall abnormalities.  She does have a mildly dilated left atrium.   CONSULTATIONS:  None.   HOSPITAL COURSE BY PROBLEM:  Problem 1.        CHEST PAIN:  Chelsea Lester was admitted to the telemetry  unit for rule-out of life-threatening causes of chest pain.  She was put  on telemetry with no abnormalities noted for greater than 24 hours.  Her  cardiac enzymes were all negative.  She did not have any episodes of  acute-onset chest pain; however, she did complain of a low-level chest  pain throughout her hospitalization.  This was improved with the  administration of a proton pump inhibitor and GI cocktail.  She was  tolerating a diet prior to discharge.  She had no episodes of nausea or  vomiting.  A chest x-ray did not show any evidence of pneumonia or  cardiopulmonary or vascular cause of her chest pain.   Problem 2.  HYPOTENSION:  Chelsea Lester was probably dehydrated from a  history of two days of nausea and vomiting.  In addition, she had  elevated glucose levels because she held her insulin.  She had increased  urinary frequency due to the hyperglycemia.  There is probably a  combination of her diabetes and acute gastritis illness that has caused  her to become volume-depleted and therefore hypotensive.  In addition,  she continued to take her  antihypertensive medications.  She was not  orthostatic at the time of discharge.  She did receive at least 2 L of  normal saline IV.  She was placed on the Glucommander and her CBGs  returned to normal in the range of the  100s.  This is probably the main  issue causing her discomfort and causing her hypotension.   Problem 3.  DIABETES:  Ms. Herbison responded very quickly to  administration of insulin.  She had no electrolyte abnormalities.  She  was resumed on her 70/30 dose that she normally takes at home and we  increased both her morning and evening doses by 5 units.  She will need  follow-up in the outpatient setting to see if this is appropriate.   Problem 4.  HISTORY OF METHICILLIN-RESISTANT STAPHYLOCOCCUS AUREUS WITH  SUPERFICIAL SKIN INFECTIONS AND SKIN BOILS:  Ms. Shanker is currently on  doxycycline for coverage of skin infection.  There is no culture data  available to say she has MRSA infection.  She is being treated  empirically.  It is unlikely that she has a recurrence of her skin  infection.  She will complete a 10-day course of doxycycline.  She does  report these are similar to the skin infections she has had in the past.  There is no evidence of any large cellulitis or any infection other than  localized infection of boils located in the axillary region and in her  groin region.   DISCHARGE LABORATORIES:  Blood culture data:  One bottle, no growth for  5 days; one bottle, Propionibacterium species (likely a contaminant).  Culture of abscess:  A few methicillin-resistant Staphylococcus aureus  bacteria isolated, sensitive to tetracycline, Bactrim and  fluoroquinolones.  Troponin 0.02, CK-MB relative index 1.1.  Urinalysis:  Greater than 1000 glucose, specific gravity 1.036, otherwise negative  study.  No formed elements seen on the urine microscopic examination.  Sodium 144, potassium 3.6, chloride 104, bicarb 32, glucose 82, BUN 12,  creatinine 0.92, bilirubin 0.2,  alkaline phosphatase 53, AST 12, ALT 14,  total protein 6.9, albumin 3.5, calcium 8.9.  CBC:  WBC 6.6, hemoglobin  11.3, platelet count 273.   DISCHARGE VITAL SIGNS:  Temperature 97.8, pulse 92, respiratory rate 18,  systolic blood pressure 108, diastolic blood pressure 63.  O2 saturation  98% on room air.      Edsel Petrin, D.O.  Electronically Signed     ______________________________  Eliseo Gum, M.D.    Dorena Dew  D:  11/20/2007  T:  11/20/2007  Job:  [id number]

## 2011-03-21 ENCOUNTER — Telehealth: Payer: Self-pay | Admitting: Gastroenterology

## 2011-03-21 NOTE — Telephone Encounter (Signed)
Pt states she has been having abdominal pain and rectal bleeding. Pt requests to be seen. Appt made for pt to see Amy Esterwood PA 03/27/11@2pm .

## 2011-03-23 NOTE — Procedures (Signed)
NAME:  Chelsea Lester, Chelsea Lester              ACCOUNT NO.:  [id number]   MEDICAL RECORD NO.:  [id number]          PATIENT TYPE:  OUT   LOCATION:  SLEEP CENTER                 FACILITY:  [facility]   PHYSICIAN:  Clinton D. Maple Hudson, M.D. DATE OF BIRTH:  1956/05/23   DATE OF STUDY:  01/21/2006                              NOCTURNAL POLYSOMNOGRAM   REFERRING PHYSICIAN:  Dr. Manning Charity.   DATE OF STUDY:  January 21, 2006.   INDICATION FOR STUDY:  Hypersomnia with sleep apnea.  Parasomnias with  history of sleep walking, sleep talking and restless sleep.   HOME MEDICATIONS:  No home medication reported.   SLEEP ARCHITECTURE:  Total sleep time 350 minutes with sleep efficiency 87%.  Stage I was 18%, stage II 58%, stages III and IV 2%, REM 22% of total sleep  time.  Sleep latency 10 minutes, REM latency 12 minutes, awake after sleep  onset 44 minutes, arousal index increased at 30.9 per hour indicating  fragmentation.  No bedtime medication taken.   RESPIRATORY DATA:  Apnea/hypopnea index (AHI, RDI) 3.4 obstructive events  per hour which is within normal limits (normal 0/5 per hour).  There were 3  obstructive apneas and 8 hypopneas.  Events were nonpositional.  REM AHI 8.5  per hour.   OXYGEN DATA:  Moderate snoring with oxygen desaturation to a nadir of 80%.  Mean oxygen saturation through the study was 96% on room air.   CARDIAC DATA:  Normal sinus rhythm.   MOVEMENT/PARASOMNIA:  The patient was noted to be talking in REM sleep.  A  total of 97 limb jerks were reported of which 39 were associated with  arousal or awakening for a periodic limb movement with arousal index of 6.7  per hour which is mildly increased.   IMPRESSION/RECOMMENDATIONS:  1.  Occasional sleep disorder breathing events, within normal limits.  AHI      3.4 per hour.  No specific therapy required.  2.  Moderate snoring with oxygen desaturation to 80% with a mean oxygen      saturation through the study of 96% on room air.  3.  Active REM with talking during REM.  Periodic limb movement with      arousal, 6.7 per hour.  Consider the      possibility of REM behavior disorder.  This is often responsive to      clonazepam or to a REM suppressing antidepressant taken at bedtime.      Clinton D. Maple Hudson, M.D.  Diplomate, Biomedical engineer of Sleep Medicine  Electronically Signed     CDY/MEDQ  D:  01/27/2006 15:06:09  T:  01/29/2006 01:57:09  Job:  [id number]

## 2011-03-23 NOTE — Op Note (Signed)
Chelsea Lester, Chelsea Lester              ACCOUNT NO.:  [id number]   MEDICAL RECORD NO.:  [id number]          PATIENT TYPE:  AMB   LOCATION:  SDS                          FACILITY:  [facility]   PHYSICIAN:  Salley Scarlet., M.D.DATE OF BIRTH:  08-19-1956   DATE OF PROCEDURE:  02/07/2007  DATE OF DISCHARGE:  02/06/2007                               OPERATIVE REPORT   PREOPERATIVE DIAGNOSIS:  Exophthalmus, both eyes.   POSTOPERATIVE DIAGNOSIS:  Exophthalmus, both eyes.   OPERATION:  Lateral tarsorrhaphy, both eyes.   SURGEON:  Nadyne Coombes, M.D.   ANESTHESIA:  General.   JUSTIFICATION FOR PROCEDURE:  This is a 55 year old lady, who has had  prominent eyes all of her life.  She has had a frequent problem of  either one of the other of her globes protruding itself from behind the  eyelids.  This condition is painful and it is frequently difficult to  decompress.  The propulsion from the orbit has become more frequent, and  she requests that something be done.  In an attempt to alleviate the  problem, we recommended a lateral tarsorrhaphy of the upper and lower  lids of both eyes.  She is admitted at this time for that purpose.   PROCEDURE:  Under the influence of general inhalation, the patient was  prepped and draped in the usual manner.  The upper and lower lids of  both the right and left eye had a segment of the lid margin excised,  having a length of 4 to 5 mm each.  Then, a double-armed 4-0 silk suture  was passed through a rubber peg and through the tarsus:  Each arm was  passed through the rubber peg, and then through the tarsus of the lower  lid, then through the tarsus of the upper lid and out the skin, and  through a second rubber peg, over which the suture was tied securely.  This same procedure was repeated for the opposite eye.  The purpose here  is to get adhesions of the upper and lower lids of each eye, therefore,  narrowing the palpebral fissures in hopes that the  globes will not be  able to project themselves from behind the narrowed lids.  The patient  tolerated the procedure well.  Maxitrol ophthalmic ointment was applied  to each eye.  She was discharged to the postanesthesia recovery room in  satisfactory condition with instructions to take Vicodin every 4 hours  as needed for pain, and to see me in the office tomorrow for further  evaluation.   DISCHARGE DIAGNOSIS:  Exophthalmus, both eyes.      Salley Scarlet., M.D.  Electronically Signed     TB/MEDQ  D:  02/07/2007  T:  02/08/2007  Job:  [id number]

## 2011-03-23 NOTE — Cardiovascular Report (Signed)
NAMECERYS, Chelsea Lester NO.:  [id number]   MEDICAL RECORD NO.:  [id number]          PATIENT TYPE:  INP   LOCATION:  6706                         FACILITY:  [facility]   PHYSICIAN:  Ricki Rodriguez, M.D.  DATE OF BIRTH:  1956-09-04   DATE OF PROCEDURE:  07/24/2006  DATE OF DISCHARGE:  07/24/2006                              CARDIAC CATHETERIZATION   REFERRING PHYSICIAN:  Ileana Roup, M.D.   PROCEDURE:  Left heart catheterization, selective coronary angiography, left  ventricular function study.   APPROACH:  Right femoral artery using 4 French sheath.   COMPLICATIONS:  None.   HEMODYNAMIC DATA:  Left ventricular pressure was 128/19 and aortic pressure  was 128/84.   INDICATIONS FOR PROCEDURE:  This 55 year old black female with recurrent  chest pain and hypertension, obesity, diabetes, hypertriglyceridemia had  abnormal nuclear stress test.   CORONARY ANATOMY:  The left main coronary artery had eccentric 20% lesion.   Left anterior descending coronary artery:  The left anterior descending  coronary artery was unremarkable and its diagonal branch was also  unremarkable.   Left circumflex coronary artery:  The left circumflex coronary artery was  also essentially unremarkable with very, very small ramus branch.  There was  a larger first obtuse marginal branch and normal second obtuse marginal  branch.   Right coronary artery:  The right coronary artery was dominant and  unremarkable.  Posterior descending coronary artery and posterior branch  were also normal.   Left ventriculogram:  The left ventriculogram showed mild generalized  hyperkinesia with ejection fraction of 50 to 55%.   IMPRESSION:  1. Mild one vessel coronary artery disease.  2. Mild left ventricular systolic dysfunction.   RECOMMENDATIONS:  This patient will be treated medically with therapeutic  lifestyle  modifications.      Ricki Rodriguez, M.D.  Electronically Signed     ASK/MEDQ  D:  07/24/2006  T:  07/25/2006  Job:  [id number]

## 2011-03-23 NOTE — Discharge Summary (Signed)
NAMESHERLIN, SONIER NO.:  [id number]   MEDICAL RECORD NO.:  [id number]          PATIENT TYPE:  INP   LOCATION:  6706                         FACILITY:  [facility]   PHYSICIAN:  Ileana Roup, M.D.  DATE OF BIRTH:  07-12-1956   DATE OF ADMISSION:  07/20/2006  DATE OF DISCHARGE:  07/24/2006                                 DISCHARGE SUMMARY   ATTENDING PHYSICIAN:  Ileana Roup, M.D.   RESIDENT PHYSICIAN:  Edsel Petrin, D.O.   DISCHARGE DIAGNOSES:  1. Noncardiac chest pain.  2. Hypertension.  3. Poorly controlled type 2 diabetes.  4. Hypertriglyceridemia.  5. Gastroesophageal reflux disease.  6. Osteoarthritis.  7. 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   DISCHARGE MEDICATIONS:  1. Aspirin 325 mg p.o. daily.  2. TriCor 145 mg p.o. q.h.s.  3. Glucotrol 10 mg p.o. daily  4. HCTZ 25 mg p.o. daily.  5. Metoprolol 50 mg p.o. b.i.d.  6. Protonix 40 mg b.i.d.  7. Simvastatin 40 mg p.o. q.h.s.  8. Valsartan 320 mg p.o. daily.  9. KCL 20 mEq  p.o. daily.  10.Metformin 1000 mg b.i.d.  11.Vicodin 5/500 one tab q.6 hours as needed for pain, number dispensed      20.  12.Lantus 20 units at bedtime daily.   DISPOSITION AND FOLLOWUP:  Ms. Kershaw was discharged in good condition with  instructions to followup in the Outpatient Clinic with Dr. Manning Charity.  She has an appointment scheduled for September 25 at 9:45.  Following this  appointment, she has a gastric emptying study scheduled on the same day at  12 p.m. to be done at [facility] in the Radiology Department.  The patient  will need to be n.p.o. prior to this appointment.   ISSUES TO BE FOLLOWED UP:  At outpatient visit:  1. Followup etiology of chest pain:  GERD, gastroparesis.  The patient may      need to be started on Reglan based on the findings of the gastric      emptying study, if the source of her discomfort is related to diabetic  gastroparesis.  2. Followup on the site where the cardiac catheterization was performed.  3. Management of poorly controlled type 2 diabetes:  The patient, while      here in the hospital, had an hemoglobin A1c of 10.3.  She was started      on Lantus and had not previously been on injectable insulin.  I      continued her on her home oral medications, but increased her metformin      to 1000 mg twice a day.  The patient was also written a prescription      for a Glucometer and was instructed to bring this meter in with her to      her outpatient appointment.  The patient has been scheduled for      outpatient diabetes education, coordinated through [facility].      She may need to also see Jamison Neighbor in the Outpatient Clinic.  4. Routine lab  work.  Scientist, water quality was added to her medications during this      hospitalization thereofre, she will also need LFTs checked in 3 to 4      weeks.   PROCEDURES:  1. Chest x-ray on July 24, 2006 showed under aeration of the lung      bases, but no active cardiopulmonary disease.  2. Myoview performed on July 23, 2006 showed a fixed defect within      the inferior wall with an area of reversibility, compatible with a      small area of periinfarct ischemia.  The left ventricular ejection      fraction was calculated at 58%.  3. CT angiography of the chest:  There was no evidence for pulmonary      embolism.  There were a few right paratracheal lymph nodes that were      slightly enlarged.  4. Cardiac Catheterization showed mild one-vessel CAD (20% eccentric LM      lesion) and mild LV systolic dysfunction (LVEF 50-55%).  Full report      pending.   CONTACTS:  Dr. Orpah Cobb with cardiology.   BRIEF ADMISSION H&P:  Ms. Harold is a very pleasant 55 year old female who  presented to the [facility] Emergency Department with a complaint of chest  pain.  She states that the chest pain started about 3 days ago and that it  awoke her from  sleep.  She states that it is worse with exertion and it  radiates into her left shoulder and arm.  She says that each episode lasts  about 20 to 30 minutes, but she says she has a constant low level of pain  substernally.  She states her pain is an 8/10.  She states that nothing  relieved this pain and she was given nitroglycerin in the ER, which also did  not improve the pain.  She has multiple poorly controlled chronic medical  problems that include type 2 diabetes and hypertension.  She did have a  cardiac catheterization in August of 2006, which did not show significant  disease of her coronary vessels.   Vital signs on admission:  Blood pressure 140/84.  Pulse rate 87.  Respirations 18.  Temperature 98.4.  Pulse oximetry 96% on room air.   SUMMARY OF PHYSICAL EXAM FINDINGS:  GENERAL:  Ms. Tackett is an obese African  American female who appeared in no acute distress.  HEENT:  Her pupils were equal, round and reactive to light.  She did have  significant exophthalmus.  Her extraocular muscles were intact.  NECK:  She did have mild  lymphadenopathy in the cervicale region of her  neck.  There was no JVD.  LUNGS:  Sounds were clear to auscultation bilaterally.  There were no  wheezes.  CARDIOVASCULAR:  She was regular rate and rhythm.  There were no murmurs,  rubs or gallops.  EXAMINATION OF EXTREMITIES:  There was no clubbing, cyanosis or edema.  GI:  Abdomen was soft, nontender, nondistended and bowel sounds were active.  There was no epigastric tenderness.  NEUROLOGIC:  There were no focal findings on neurological exam.   HOSPITAL COURSE:  1. Chest pain:  Ms. Lingenfelter was admitted to rule out of cardiac source of      chest pain.  Her initial EKG showed only nonspecific ST changes.  There      was no findings of ischemia.  Her cardiac point of care markers, as      well  as 2 set of cardiac panels drawn while inpatient were negative for     elevations in troponin.  Given her multiple  risk factors and severe      hypertension and poorly controlled diabetes, Dr. Algie Coffer, her      cardiologist was consulted. Her presentation was worrisome for a      potential anginal pain.  He proceeded with a Myoview and stress test,      which did, indeed, show some areas of periischemic changes of the      inferior wall.  Dr. Algie Coffer decided to proceed with a cardiac      catheterization.  Cardiac catheterization did not show any significant      coronary disease.  At the time of discharge, the patient was pain free      and did not have any abnormal vital signs or associated symptoms.  It      is likely, at this time, that Ms. Barbaro's chest pain is not cardiac in      nature, but either related to her gastrointestinal system or      musculoskeletal system.   1. Poorly controlled type 2 diabetes:  Patient was placed on a resistant      sliding scale insulin and while we held her metformin and glipizide as      an inpatient.  Her blood sugars were significantly elevated on her      admission and ranged in the 300s to 400s.  Hemoglobin A1c was 10.3.  At      this time, the patient would benefit from an addition of Lantus to her      oral hypoglycemic agents.  We started her on 10 units of Lantus.  She      received diabetic education at the bedside while inpatient and      instruction on how to use her insulin syringes.  Records indicate that      she was, indeed, compliant with her metformin at home, as well as her      glipizide.  Pharmacy records indicate that she has regularly filled      both of these prescriptions.  She will need close outpatient followup      for her poorly controlled type 2 diabetes.   1. Gastroesophageal reflux disease:  There is likely a GI source of Ms.      Governale's chest pain.  We increased her Protonix to 40 mg b.i.d.  At this      time, we will schedule an outpatient gastric emptying study.  Her pain      may be related to gastroparesis.  We will not  start Reglan at this      time; however, if her gastric emptying studies suggest gastroparesis,      she can be started on this as an outpatient.   1. Severe hyperlipidemia:  While inpatient, we obtained a fasting lipid      panel, which showed that Ms. Ebrahim has severe hypertriglyceridemia.      Her triglyceride level was 1,413.  This is excessively high.      Therefore, we were concerned about the risk for acute pancreatitis in      the presentation of her chest pain.  Her lipase level, however, was      normal.  She is currently on simvastatin for control of her      cholesterol.  We added TriCor 145 mg daily to her statin therapy for  her elevation in triglycerides.  This will need to be followed closely      as an outpatient.  Currently, her liver function tests are normal.   1. Hypertension:  Ms. Tatro is managed on multiple home medications for      her hypertension.  Her medications include metoprolol 50 mg b.i.d.,     Valsartan 320 mg daily and hydrochlorothiazide 20 mg daily.  These      medications also reduce her risk factors for cardiovascular disease.      These medications were continued at the time Ms. Roldan was admitted.      She was also discharged with no change to these medications.   1. Osteoarthritis:  Ms. Sheehy has a history of chronic back pain, as well      as knee osteoarthritis.  She was continued on her home pain      medications, which included Vicodin 5/500 q.6 hours p.r.n. for joint      pain.  There may also be an element of musculoskeletal pain associated      with her chest pain presentation.  She did have relief from her chest      pain with administration of the Vicodin.   1. History of hyperthyroid with exophthalmus:  A CT scan did show some      enlargement of the peritracheal nodes.  A TSH was performed, which was      normal.  At this time, she does not demonstrate any of the metabolic      changes associated with a hyperthyroid condition.   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  This can be followed as an outpatient.   DISCHARGE LABORATORIES:  Sodium 134, potassium 3.1, chloride 96, bicarb 31,  glucose 216, BUN 7, creatinine 0.8, calcium 8.5.  CBC:  White blood cell is  4.9, hemoglobin 11.2, hematocrit 34.4, platelets 240.  Her PTT was 25.  Her  PT/INR was 12.5 and 0.9.   DISCHARGE VITAL SIGNS:  Temperature 98.6.  Pulse 62.  Respirations 20.  Systolic blood pressure 113.  Diastolic blood pressure 72.      Edsel Petrin, D.O.  Electronically Signed      Ileana Roup, M.D.  Electronically Signed    ELG/MEDQ  D:  07/24/2006  T:  07/24/2006  Job:  [id number]   cc:   Manning Charity, MD  Ricki Rodriguez, M.D.

## 2011-03-23 NOTE — Discharge Summary (Signed)
Chelsea Lester, Chelsea Lester              ACCOUNT NO.:  [id number]   MEDICAL RECORD NO.:  [id number]          PATIENT TYPE:  INP   LOCATION:  3707                         FACILITY:  [facility]   PHYSICIAN:  Ileana Roup, M.D.  DATE OF BIRTH:  1956/05/20   DATE OF ADMISSION:  06/08/2005  DATE OF DISCHARGE:  06/12/2005                                 DISCHARGE SUMMARY   DISCHARGE DIAGNOSES:  1.  Chest pain, non-cardiac related.  2.  Nausea, vomiting, diarrhea.  3.  Hypokalemia.  4.  Hypertension.  5.  Asthma.  6.  Hypercholesterolemia.  7.  Type 2 diabetes.  8.  Gastroesophageal reflux disease.  9.  Osteoarthritis.  10. Strong family history of breast cancer.   DISCHARGE MEDICATIONS:  1.  Zocor 40 mg one tab p.o. daily.  2.  Diovan 320 mg one tab p.o. daily.  3.  Lasix 40 mg one tab p.o. daily.  4.  Potassium chloride 40 mEq one tab p.o. daily.  5.  Norvasc 10 mg one tab p.o. daily.  6.  Prevacid 30 mg one tab p.o. daily.  7.  Advair 250/50 one puff b.i.d.  8.  Glipizide 10 mg one tab p.o. daily.  9.  Allegra 180 mg one tab p.o. daily.  10. Metformin 500 mg one tab p.o. b.i.d. to be resumed on June 15, 2005.  11. Albuterol meter dosed inhaler one to two puffs q.4h. p.r.n.  12. Atrovent meter dosed inhaler one puff t.i.d.  13. Iron sulfate 325 mg t.i.d.  14. Ativan 1 mg one tab p.o. b.i.d.  15. Nitroglycerin 0.4 mg sublingual every five minutes for chest pain.  16. Vicodin one to two tabs q.4h. p.r.n. pain, number dispensed is 30.   CONDITION ON DISCHARGE:  Stable.   FOLLOWUP:  1.  The patient will follow up with the Outpatient Clinic for hospital      followup on June 22, 2005, at 2:30.  2.  The patient also has a follow up appointment with Dr. Arlyce Dice at [facility]      GI on June 25, 2005, at 11 a.m., as previously scheduled.   PROCEDURES:  1.  Chest x-ray on June 08, 2005, shows bibasilar atelectasis.  2.  Chest x-ray on June 11, 2005, shows mild bronchitic changes.  3.  Cardiac stress test done on June 10, 2005, shows reversible defect in      the anterior wall near the apex, concerning for ischemia, with an      ejection fraction of 64%.  4.  Cardiac catheterization done on June 12, 2005, shows mild left main      coronary disease with preserved left ventricular systolic function.   CONSULTATIONS:  Ricki Rodriguez, M.D., cardiology.   HISTORY OF PRESENT ILLNESS:  Ms. Contino is a 55 year old African-American  female with a past medical history of questionable coronary artery disease,  asthma, and GERD, who presents with a three week history of chest pain worse  over the past week, as well as nausea, vomiting, and diarrhea post  antibiotics for boils.  The chest pain is left-sided, dull to  sharp, lasts  for seconds, and is associated with diaphoresis and shortness of breath,  worse with exertion and inspiration, radiating to the left arm.   PHYSICAL EXAMINATION:  VITAL SIGNS:  At the time of admission, the patient's  vitals were temperature of 97.5, blood pressure 152/105, pulse 77,  respirations 22, 99% on room air.  LUNGS:  Distant breath sounds.  Clear to auscultation bilaterally with no  wheezes or rhonchi.  CARDIOVASCULAR:  Distant sounds, regular rate and rhythm, no murmurs, no  JVD, good pulses.  No tenderness to palpation of the chest.  EXTREMITIES:  With non-pitting edema bilaterally.   LABORATORY DATA:  At the time of admission, hemoglobin was 13.3, hematocrit  39.  Sodium 138, potassium 2.6, chloride 99, bicarbonate 34, BUN 9,  creatinine 0.8, glucose 89.  Point-of-care markers were negative.   HOSPITAL COURSE:  #1 -  CHEST PAIN:  The patient was admitted with chest  pain for rule out MI.  EKG and cardiac enzymes were negative.  D-dimer was  also negative, and chest x-ray only showed mild bronchitic changes.  Cardiology was consulted and a stress test was done which showed reversible  defects suspicious for ischemia.  Therefore, the  patient was scheduled for  cardiac catheterization which showed only mild left main disease.  During  the hospitalization, the patient had one or two episodes of this chest pain,  refused nitroglycerin for this chest pain, saying that it did not help.  Discharged in stable condition without active chest pain.  #2 -  ANEMIA:  The patient came in with a hemoglobin of 13.3;  however, on  repeat, the hemoglobin was 10.1.  Iron was 39, and total iron binding  capacity was 283, with percent saturation 14, and ferritin 32.  The patient  is known to have a microcytic anemia, takes iron as an outpatient, and is  scheduled for EGD and colonoscopy with Dr. Arlyce Dice on June 25, 2005.  This  was an appointment that was scheduled before the patient was admitted to the  hospital, and we recommend the patient follow up for microcytic anemia with  occult blood positive stools.  #3 -  HYPOKALEMIA:  The patient came in with a potassium of 2.6.  This was  repleted during hospitalization, and at the time of discharge, the patient's  potassium was 3.6.  Magnesium was normal at 1.9.  The hypokalemia was  thought to be due to the p.o. Lasix that the patient is taking.  The patient  was sent out on potassium 40 mEq daily, and this should be checked as an  outpatient.  #4 -  DYSLIPIDEMIA:  The patient came in with a diagnosis of dyslipidemia  and was on admission on Lipitor 20 mg daily.  A cholesterol panel done in  house showed a total cholesterol of 132, LDL 26, HDL 44, however,  triglycerides were 308.  The patient was discharged home on Zocor 40 mg  daily, and told to resume a low fat, low concentrated sweets diet.  #5 -  THYROID DISEASE:  The patient came in with a questionable diagnosis of  thyroid disease;  however, she was on no treatment per admission  medications.  TSH was done which was normal at 1.143.  #6 -  DIABETES TYPE 2:  The patient came in with a diagnosis of type 2 diabetes on multiple oral  medications, including Glipizide 10 and Metformin  500 mg b.i.d.  Her hemoglobin A1C during this admission was 7.6.  Her CBG's  were within normal range.  She was covered by sliding scale insulin during  the course of the hospitalization, and she was sent out on her home  medications of Glipizide 10, Metformin 500 mg b.i.d., with the Metformin  being held two days after the cardiac catheterization and to be resumed on  June 15, 2005.  #7 -  NAUSEA, VOMITING, AND DIARRHEA:  The nausea and vomiting were not  significant, had been going on for weeks prior to the hospitalization.  The  patient was given Phenergan p.r.n. and did not really acquire any during the  course of the hospitalization.  The patient did complain of diarrhea that  started after she received antibiotics for boils approximately a month ago.  C. diff for stools was ordered, however, the patient was not able to give  any stool samples and reported that the diarrhea had resolved at the time of  discharge.  #8 -  HYPERTENSION:  The patient came in with a diagnosis of hypertension.  On admission, her blood pressure was elevated.  Her home medications were  continued in house, and her blood pressures were actually well controlled  during the hospitalization.  She was sent out on Diovan 320 mg daily, Lasix  40 mg daily, Norvasc 10 mg daily, and will follow up on her blood pressure  at the time of her hospital followup.   DISCHARGE LABORATORY DATA AND VITAL SIGNS:  On the day of discharge, the  patient's vital signs were stable with a temperature of 97.8, pulse 74,  blood pressure 122/90, respirations 18, 97% on room air.  CBG's 146 to 218.  CBC with a white blood cell count of 6.9, hemoglobin 10.1, hematocrit 30.1,  platelets 319, MCV 69.6.  Sodium 139, potassium 3.6, chloride 103,  bicarbonate 29, BUN 11, creatinine 0.7, glucose 171.      Do   DC/MEDQ  D:  06/16/2005  T:  06/16/2005  Job:  [id number]   cc:   Outpatient  Clinic   Barbette Hair. Arlyce Dice, M.D. [facility]   Ricki Rodriguez, M.D.  [address]  [place] [address]  Fax: [phone]
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
## 2011-03-23 NOTE — Assessment & Plan Note (Signed)
[facility]                         GASTROENTEROLOGY OFFICE NOTE   SHERLIN, SONIER                     MRN:          [id number]  DATE:01/27/2007                            DOB:          Oct 20, 1956    REASON:  Chelsea Lester has returned following upper and lower endoscopy.  The former demonstrated a distal esophageal stricture for which she was  dilated.  Small gastric polyps were seen.  Biopsies showed hyperplastic  changes only.  Her colonoscopy was remarkable only for diverticulosis  and internal hemorrhoids.  On Levbid, Ms. Mckeel reports her abdominal  pain has entirely subsided.  Off Levbid, she has occasional crampy lower  abdominal pain.  Dysphagia has resolved following dilatation.   PHYSICAL EXAMINATION:  Pulse 68, blood pressure 90/70, weight 286.  HEENT:  EOMI. PERRLA. Sclerae are anicteric.  Conjunctivae are pink.  NECK:  Supple without thyromegaly, adenopathy or carotid bruits.  CHEST:  Clear to auscultation and percussion without adventitious  sounds.  CARDIAC:  Regular rhythm; normal S1 S2.  There are no murmurs, gallops  or rubs.  ABDOMEN:  Bowel sounds are normoactive.  Abdomen is soft, non-tender and  non-distended.  There are no abdominal masses, tenderness, splenic  enlargement or hepatomegaly.  EXTREMITIES:  Full range of motion.  No cyanosis, clubbing or edema.  RECTAL:  There are no masses.  Stool is Hemoccult negative.   IMPRESSION:  1. Lower abdominal pain secondary to nonspecific spasm.  2. Esophageal stricture - asymptomatic following dilatation therapy.   RECOMMENDATION:  1. Continue Levbid p.r.n.  2. Repeat dilatation as needed.     Barbette Hair. Arlyce Dice, MD,FACG  Electronically Signed    RDK/MedQ  DD: 01/27/2007  DT: 01/27/2007  Job #: [id number]   cc:   Will Averett, M.D.

## 2011-03-23 NOTE — Assessment & Plan Note (Signed)
[facility]                         GASTROENTEROLOGY OFFICE NOTE   Chelsea, Lester                     MRN:          [id number]  DATE:12/23/2006                            DOB:          Jan 14, 1956    December 23, 2006   PROBLEM:  Abdominal pain.   Chelsea Lester has returned complaining of abdominal pain.  She has diffuse  lower abdominal pain.  It will awaken her at night or prevent her from  sleeping.  She has had recent onset constipation.  There is no history  of melena or hematochezia.  She has headache history of bleeding gastric  polyps for which she underwent band ligation.  She also has a history of  an esophageal stricture.  She does report occasional dysphagia to  solids.   PAST MEDICAL HISTORY:  Diabetes.   MEDICATIONS:  1. Prevacid 30 mg twice a day.  2. Glipizide.  3. Hydrochlorothiazide.  4. Potassium.  5. Allegra.  6. Diovan.  7. Iron.  8. Sertraline.  9. Baby aspirin.  10.Tricor.  11.Lexapro.  12.Zocor.  13.Insulin.   ALLERGIES:  NO KNOWN DRUG ALLERGIES.   PHYSICAL EXAMINATION:  VITAL SIGNS:  Pulse 70, blood pressure 108/64,  weight 278.  HEENT:  EOMI. PERRLA. Sclerae are anicteric.  Conjunctivae are pink.  NECK:  Supple without thyromegaly, adenopathy or carotid bruits.  CHEST:  Clear to auscultation and percussion without adventitious  sounds.  CARDIAC:  Regular rhythm; normal S1 S2.  There are no murmurs, gallops  or rubs.  ABDOMEN:  Bowel sounds are normoactive.  Abdomen is soft, non-tender and  non-distended.  There are no abdominal masses, tenderness, splenic  enlargement or hepatomegaly.  EXTREMITIES:  Full range of motion.  No cyanosis, clubbing or edema.  RECTAL:  Examination deferred.   IMPRESSION:  1. Lower abdominal pain.  A structural lesion ought to be ruled out.  2. History of bleeding gastric polyps.  3. Dysphagia - rule out recurrent esophageal stricture.  4. Remote history of polyps.  5.  Diabetes.   RECOMMENDATIONS:  1. Colonoscopy.  If negative, I would consider a trial of      anticholinergics.  2. Upper endoscopy with balloon dilatation as indicated.  Recurrent      gastric polyps will also be treated by band ligation.     Barbette Hair. Arlyce Dice, MD,FACG  Electronically Signed    RDK/MedQ  DD: 12/23/2006  DT: 12/23/2006  Job #: [id number]   cc:   Will Averett, M.D.

## 2011-03-23 NOTE — Cardiovascular Report (Signed)
Chelsea Lester, Chelsea Lester              ACCOUNT NO.:  [id number]   MEDICAL RECORD NO.:  [id number]          PATIENT TYPE:  INP   LOCATION:  3707                         FACILITY:  [facility]   PHYSICIAN:  Ricki Rodriguez, M.D.  DATE OF BIRTH:  [birth year]   DATE OF PROCEDURE:  06/12/2005  DATE OF DISCHARGE:                              CARDIAC CATHETERIZATION   REFERRING PHYSICIAN:  Dr. Margarito Liner.   PROCEDURE PERFORMED:  Left heart catheterization, selective coronary  angiography and left ventricular function study.   INDICATION:  This 55 year old black female with a history of hypertension,  diabetes, obesity, hypertriglyceridemia, had chest pain and possible  abnormal nuclear stress test.   APPROACH:  Right femoral artery using 5 French sheath.   COMPLICATIONS:  None.   HEMODYNAMIC DATA:  The left ventricular pressure was 125/20 and aortic  pressure was 125/76.   Coronary anatomy:  The left main coronary artery showed 20-30% eccentric  lesion.   Left anterior descending coronary artery was unremarkable except for distal  half showing diffuse narrowing, diagonal one vessel was unremarkable.   Left circumflex coronary artery:  The left circumflex coronary artery was  essentially unremarkable.  Its ramus branch was very small vessel, obtuse  marginal branch one was the larger vessel, obtuse marginal branch two was  unremarkable.   Right coronary artery:  The right coronary artery with its posterolateral  branch and posterior descending coronary artery was unremarkable and  dominant.   Left ventriculogram:  The left ventriculogram showed near normal left  ventricular systolic function with ejection fraction of 65%.   IMPRESSION:  1.  Mild left main coronary artery disease.  2.  Preserved left ventricular systolic function.   RECOMMENDATIONS:  This patient will be treated with nitrates or calcium  channel blockers along with addition of Lipitor plus low fat diet for  elevated  triglyceride levels, and we will hold metformin for two days.  The  resident for the patient was notified.      Ricki Rodriguez, M.D.  Electronically Signed     ASK/MEDQ  D:  06/12/2005  T:  06/12/2005  Job:  [id number]
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
## 2011-03-27 ENCOUNTER — Ambulatory Visit (INDEPENDENT_AMBULATORY_CARE_PROVIDER_SITE_OTHER): Payer: Medicaid Other | Admitting: Physician Assistant

## 2011-03-27 ENCOUNTER — Encounter: Payer: Self-pay | Admitting: Physician Assistant

## 2011-03-27 VITALS — BP 142/88 | HR 90 | Ht 67.0 in | Wt 298.0 lb

## 2011-03-27 DIAGNOSIS — R1084 Generalized abdominal pain: Secondary | ICD-10-CM

## 2011-03-27 DIAGNOSIS — R110 Nausea: Secondary | ICD-10-CM

## 2011-03-27 DIAGNOSIS — B370 Candidal stomatitis: Secondary | ICD-10-CM

## 2011-03-27 DIAGNOSIS — K589 Irritable bowel syndrome without diarrhea: Secondary | ICD-10-CM

## 2011-03-27 DIAGNOSIS — K222 Esophageal obstruction: Secondary | ICD-10-CM

## 2011-03-27 MED ORDER — NYSTATIN 100000 UNIT/ML MT SUSP: 500000.0000 [IU] | Freq: Four times a day (QID) | OROMUCOSAL | Status: AC

## 2011-03-27 MED ORDER — HYOSCYAMINE SULFATE ER 0.375 MG PO TB12: 0.3750 mg | ORAL_TABLET | Freq: Two times a day (BID) | ORAL | Status: DC | PRN

## 2011-03-27 MED ORDER — DEXLANSOPRAZOLE 60 MG PO CPDR: 60.0000 mg | DELAYED_RELEASE_CAPSULE | Freq: Every day | ORAL | Status: AC

## 2011-03-27 NOTE — Progress Notes (Signed)
Subjective:    Patient ID: Chelsea Lester, female    DOB: 1956/10/12, 55 y.o.   MRN: [id number]  HPI Chelsea Lester is a 55 year old African American female known to Dr. Oscar La who has history of diverticular disease and adenomatous polyps. Last colonoscopy was done in January 2011. She also has history of chronic GERD and an esophageal stricture which was dilated in February 2012. She has multiple other medical problems including morbid obesity ,diabetes, hypertension, dyslipidemia, sleep apnea, depression sickle cell trait, and says that she was recently diagnosed with lupus. She comes back in today with complaints of fairly generalized abdominal discomfort which he describes as throbbing and griping over the past couple of weeks. She says she has had some symptoms over the past 6-7 months and this is also been associated with pain in her back and both of her legs but says that the abdominal discomfort has been worse over the past couple of weeks. She has had some associated nausea without vomiting and complaining of daily heartburn despite being on Pepcid twice daily. She also has persistent complaints of dysphagia primarily to solid food but says at times that she will choke with liquids as well She thinks the esophageal dilation may have helped a little. She has recently been not constipated and had one episode of very small volume of bright red blood per rectum noted on her underwear about 2 weeks ago. This has not recurred. She has been on a long course of Keflex over the past month for a peri-orbital infection and says she just completed the antibiotics. She also mentions that he sore mouth.  Patient had previously been given Levbid to use when necessary it says that this was helpful but she had run out and did not have any refills.    Review of Systems  Constitutional: Negative.   HENT: Positive for trouble swallowing.   Respiratory: Negative.   Cardiovascular: Negative.   Gastrointestinal:  Positive for abdominal pain and anal bleeding.  Genitourinary: Negative.   Musculoskeletal: Positive for arthralgias.  Skin: Negative.   Neurological: Negative.   Hematological: Negative.   Psychiatric/Behavioral: Negative.    Outpatient Prescriptions Prior to Visit  Medication Sig Dispense Refill  . albuterol (PROVENTIL,VENTOLIN) 90 MCG/ACT inhaler Inhale 2 puffs into the lungs every 6 (six) hours as needed.        . ALPRAZolam (XANAX) 0.5 MG tablet Take 0.5 mg by mouth at bedtime as needed.        Marland Kitchen aspirin 81 MG tablet Take 81 mg by mouth daily.        . famotidine (PEPCID) 40 MG tablet TAKE 1 TABLET EACH DAY  30 tablet  12  . fenofibrate (TRICOR) 145 MG tablet Take 145 mg by mouth daily.        . ferrous sulfate 325 (65 FE) MG tablet Take 325 mg by mouth daily with breakfast.        . fluticasone-salmeterol (ADVAIR HFA) 115-21 MCG/ACT inhaler Inhale 2 puffs into the lungs as directed.        . insulin NPH-insulin regular (NOVOLIN 70/30) (70-30) 100 UNIT/ML injection Inject 55 Units into the skin every morning. 40 units every evening       . Insulin Syringes, Disposable, U-100 1 ML MISC by Does not apply route.        Marland Kitchen ipratropium (ATROVENT) 0.02 % nebulizer solution Take 500 mcg by nebulization 3 (three) times daily.        . Lancets MISC by Does  not apply route.        . loratadine (CLARITIN) 10 MG tablet Take 10 mg by mouth daily.        . metoprolol (LOPRESSOR) 50 MG tablet Take 50 mg by mouth 2 (two) times daily.        . NON FORMULARY [employer] test strip( Glucose Blood)       . simvastatin (ZOCOR) 40 MG tablet Take 40 mg by mouth at bedtime.        . hyoscyamine (LEVBID) 0.375 MG 12 hr tablet Take 0.375 mg by mouth every 12 (twelve) hours as needed.             Objective:   Physical Exam Well-developed African American female in no acute distress, alert and oriented x3 all pleasant, obese HEENT; nontraumatic normocephalic EOMI PERRLA sclera anicteric tongue is coated with  yellow plaque and dry , Neck; Supple no JVD  Cardiovascular; regular rate and rhythm with S1-S2 no murmur rub or gallop  Pulmonary; clear bilaterally  Abdomen; large. Soft. mild diffuse tenderness subjectively and increased tenderness in the right upper quadrant without guarding or rebound, no palpable mass or hepatosplenomegaly, bowel sounds active  Rectal, Hemoccult-negative stool small external hemorrhoids noninflamed  Extremities; trace edema bilaterally lower extremities  Psych; mood and affect normal an appropriate        Assessment & Plan:  #76 55 year old female with multiple medical problems with complaints of generalized abdominal discomfort and vague nausea. I suspect some of her symptoms are secondary to recent long course of Keflex, aggravating IBS.  Plan; schedule ultrasound to rule out gallbladder disease Refill Levbid one by mouth twice daily as needed for abdominal discomfort  #2 Self limited very small volume hematochezia x1 Suspect local anorectal source, no further workup indicated at present  #3 Chronic GERD with history of esophageal stricture, status post dilation February 2012 with persistent complaints of dysphagia intermittent Stop Pepcid Start trial of the Dexilant 60 mg by mouth daily Return office visit in approximately one month, if dysphagia is persisting may consider repeat EGD with dilation  #4 Oral thrush Start Mycostatin oral suspension 5 cc swish and swallow 4 times daily x10 days

## 2011-03-27 NOTE — Patient Instructions (Signed)
We have scheduled the Abdominal Ultrasound for Thurs 03-29-2011  Directions and brochure provided. We have given you sampes of Dexilant to take 1 capsule daily 30 min before breakfast. If this helps we will send a prescription to the pharmacy. We refilled the Levbid today at your pharmacy. We sent a prescription to [facility] for the Mycostatin liquid. You can swish and swallow or spit 5 cc's daily. Make a follow up appointment with Dr. Arlyce Dice in 1 month.

## 2011-03-28 NOTE — Progress Notes (Signed)
I agree with assessment and plan.

## 2011-03-29 ENCOUNTER — Ambulatory Visit (HOSPITAL_COMMUNITY)
Admission: RE | Admit: 2011-03-29 | Discharge: 2011-03-29 | Disposition: A | Discharge status: Home / Self Care | Payer: Medicaid Other | Source: Ambulatory Visit | Attending: Physician Assistant | Admitting: Physician Assistant

## 2011-03-29 DIAGNOSIS — R1084 Generalized abdominal pain: Secondary | ICD-10-CM

## 2011-03-29 DIAGNOSIS — R1011 Right upper quadrant pain: Secondary | ICD-10-CM | POA: Insufficient documentation

## 2011-03-29 DIAGNOSIS — R110 Nausea: Secondary | ICD-10-CM

## 2011-03-30 ENCOUNTER — Ambulatory Visit (HOSPITAL_BASED_OUTPATIENT_CLINIC_OR_DEPARTMENT_OTHER)
Admission: RE | Admit: 2011-03-30 | Discharge: 2011-03-30 | Disposition: A | Discharge status: Home / Self Care | Payer: Medicaid Other | Source: Ambulatory Visit | Attending: Ophthalmology | Admitting: Ophthalmology

## 2011-03-30 DIAGNOSIS — H0019 Chalazion unspecified eye, unspecified eyelid: Secondary | ICD-10-CM | POA: Insufficient documentation

## 2011-04-09 ENCOUNTER — Telehealth: Payer: Self-pay | Admitting: *Deleted

## 2011-04-09 NOTE — Telephone Encounter (Signed)
Patient notified of results as per Amy Esterwood, PA 

## 2011-04-09 NOTE — Telephone Encounter (Signed)
Message copied by Daphine Deutscher on Mon Apr 09, 2011  2:31 PM ------      Message from: [place], Virginia S      Created: Mon Apr 09, 2011 11:08 AM       Please call pt, let her know the US shows mild fatty changes in liver-otherwise negative

## 2011-04-10 ENCOUNTER — Emergency Department (HOSPITAL_COMMUNITY): Payer: Medicaid Other

## 2011-04-10 ENCOUNTER — Emergency Department (HOSPITAL_COMMUNITY)
Admission: EM | Admit: 2011-04-10 | Discharge: 2011-04-10 | Disposition: A | Discharge status: Home / Self Care | Payer: Medicaid Other | Attending: Emergency Medicine | Admitting: Emergency Medicine

## 2011-04-10 DIAGNOSIS — Z7982 Long term (current) use of aspirin: Secondary | ICD-10-CM | POA: Insufficient documentation

## 2011-04-10 DIAGNOSIS — Z79899 Other long term (current) drug therapy: Secondary | ICD-10-CM | POA: Insufficient documentation

## 2011-04-10 DIAGNOSIS — I252 Old myocardial infarction: Secondary | ICD-10-CM | POA: Insufficient documentation

## 2011-04-10 DIAGNOSIS — E039 Hypothyroidism, unspecified: Secondary | ICD-10-CM | POA: Insufficient documentation

## 2011-04-10 DIAGNOSIS — J45909 Unspecified asthma, uncomplicated: Secondary | ICD-10-CM | POA: Insufficient documentation

## 2011-04-10 DIAGNOSIS — R070 Pain in throat: Secondary | ICD-10-CM | POA: Insufficient documentation

## 2011-04-10 DIAGNOSIS — E119 Type 2 diabetes mellitus without complications: Secondary | ICD-10-CM | POA: Insufficient documentation

## 2011-04-10 DIAGNOSIS — K219 Gastro-esophageal reflux disease without esophagitis: Secondary | ICD-10-CM | POA: Insufficient documentation

## 2011-04-10 DIAGNOSIS — Z794 Long term (current) use of insulin: Secondary | ICD-10-CM | POA: Insufficient documentation

## 2011-04-10 DIAGNOSIS — M109 Gout, unspecified: Secondary | ICD-10-CM | POA: Insufficient documentation

## 2011-04-10 DIAGNOSIS — R079 Chest pain, unspecified: Secondary | ICD-10-CM | POA: Insufficient documentation

## 2011-04-10 DIAGNOSIS — R55 Syncope and collapse: Secondary | ICD-10-CM | POA: Insufficient documentation

## 2011-04-10 DIAGNOSIS — R51 Headache: Secondary | ICD-10-CM | POA: Insufficient documentation

## 2011-04-10 DIAGNOSIS — Z8673 Personal history of transient ischemic attack (TIA), and cerebral infarction without residual deficits: Secondary | ICD-10-CM | POA: Insufficient documentation

## 2011-04-10 DIAGNOSIS — M549 Dorsalgia, unspecified: Secondary | ICD-10-CM | POA: Insufficient documentation

## 2011-04-10 DIAGNOSIS — I10 Essential (primary) hypertension: Secondary | ICD-10-CM | POA: Insufficient documentation

## 2011-04-10 DIAGNOSIS — R109 Unspecified abdominal pain: Secondary | ICD-10-CM | POA: Insufficient documentation

## 2011-04-10 DIAGNOSIS — I2510 Atherosclerotic heart disease of native coronary artery without angina pectoris: Secondary | ICD-10-CM | POA: Insufficient documentation

## 2011-04-10 DIAGNOSIS — E669 Obesity, unspecified: Secondary | ICD-10-CM | POA: Insufficient documentation

## 2011-04-10 LAB — COMPREHENSIVE METABOLIC PANEL
ALT: 20 U/L (ref 0–35)
BUN: 14 mg/dL (ref 6–23)
CO2: 33 mEq/L — ABNORMAL HIGH (ref 19–32)
Calcium: 8.8 mg/dL (ref 8.4–10.5)
Creatinine, Ser: 0.74 mg/dL (ref 0.4–1.2)
GFR calc non Af Amer: >60 mL/min (ref >60)
Glucose, Bld: 93 mg/dL (ref 70–99)
Sodium: 143 mEq/L (ref 135–145)
Total Protein: 7.7 g/dL (ref 6.0–8.3)

## 2011-04-10 LAB — DIFFERENTIAL
Eosinophils Absolute: 0.1 10*3/uL (ref 0.0–0.7)
Eosinophils Relative: 2 % (ref 0–5)
Lymphocytes Relative: 52 % — ABNORMAL HIGH (ref 12–46)
Lymphs Abs: 3.3 10*3/uL (ref 0.7–4.0)
Monocytes Absolute: 0.4 10*3/uL (ref 0.1–1.0)
Monocytes Relative: 6 % (ref 3–12)

## 2011-04-10 LAB — CBC
HCT: 33.2 % — ABNORMAL LOW (ref 36.0–46.0)
MCHC: 33.1 g/dL (ref 30.0–36.0)
RDW: 19.4 % — ABNORMAL HIGH (ref 11.5–15.5)
WBC: 6.3 10*3/uL (ref 4.0–10.5)

## 2011-04-10 LAB — URINALYSIS, ROUTINE W REFLEX MICROSCOPIC
Bilirubin Urine: NEGATIVE
Nitrite: NEGATIVE
Specific Gravity, Urine: 1.015 (ref 1.005–1.030)
Urobilinogen, UA: 0.2 mg/dL (ref 0.0–1.0)
pH: 5.5 (ref 5.0–8.0)

## 2011-04-10 LAB — CK TOTAL AND CKMB (NOT AT ARMC)
CK, MB: 3.9 ng/mL (ref 0.3–4.0)
Relative Index: 1.3 (ref 0.0–2.5)
Total CK: 310 U/L — ABNORMAL HIGH (ref 7–177)

## 2011-04-10 LAB — GLUCOSE, CAPILLARY: Glucose-Capillary: 76 mg/dL (ref 70–99)

## 2011-04-10 LAB — LIPASE, BLOOD: Lipase: 86 U/L — ABNORMAL HIGH (ref 11–59)

## 2011-04-10 MED ORDER — IOHEXOL 300 MG/ML  SOLN
100.0000 mL | Freq: Once | INTRAMUSCULAR | Status: AC | PRN
  Administered 2011-04-10: 100 mL via INTRAVENOUS

## 2011-04-27 ENCOUNTER — Emergency Department (HOSPITAL_COMMUNITY): Payer: Medicaid Other

## 2011-04-27 ENCOUNTER — Inpatient Hospital Stay (HOSPITAL_COMMUNITY)
Admission: EM | Admit: 2011-04-27 | Discharge: 2011-05-01 | DRG: 287 | Disposition: A | Discharge status: Home / Self Care | Payer: Medicaid Other | Attending: Cardiovascular Disease | Admitting: Cardiovascular Disease

## 2011-04-27 DIAGNOSIS — M109 Gout, unspecified: Secondary | ICD-10-CM | POA: Diagnosis present

## 2011-04-27 DIAGNOSIS — I10 Essential (primary) hypertension: Secondary | ICD-10-CM | POA: Diagnosis present

## 2011-04-27 DIAGNOSIS — R0789 Other chest pain: Principal | ICD-10-CM | POA: Diagnosis present

## 2011-04-27 DIAGNOSIS — E876 Hypokalemia: Secondary | ICD-10-CM | POA: Diagnosis present

## 2011-04-27 DIAGNOSIS — E119 Type 2 diabetes mellitus without complications: Secondary | ICD-10-CM | POA: Diagnosis present

## 2011-04-27 DIAGNOSIS — F3289 Other specified depressive episodes: Secondary | ICD-10-CM | POA: Diagnosis present

## 2011-04-27 DIAGNOSIS — Z794 Long term (current) use of insulin: Secondary | ICD-10-CM

## 2011-04-27 DIAGNOSIS — F329 Major depressive disorder, single episode, unspecified: Secondary | ICD-10-CM | POA: Diagnosis present

## 2011-04-27 DIAGNOSIS — J45909 Unspecified asthma, uncomplicated: Secondary | ICD-10-CM | POA: Diagnosis present

## 2011-04-27 DIAGNOSIS — E669 Obesity, unspecified: Secondary | ICD-10-CM | POA: Diagnosis present

## 2011-04-27 DIAGNOSIS — H409 Unspecified glaucoma: Secondary | ICD-10-CM | POA: Diagnosis present

## 2011-04-27 DIAGNOSIS — G609 Hereditary and idiopathic neuropathy, unspecified: Secondary | ICD-10-CM | POA: Diagnosis present

## 2011-04-27 LAB — DIFFERENTIAL
Basophils Absolute: 0 10*3/uL (ref 0.0–0.1)
Basophils Relative: 0 % (ref 0–1)
Eosinophils Absolute: 0.1 10*3/uL (ref 0.0–0.7)
Monocytes Relative: 5 % (ref 3–12)
Neutro Abs: 3 10*3/uL (ref 1.7–7.7)
Neutrophils Relative %: 54 % (ref 43–77)

## 2011-04-27 LAB — CBC
Hemoglobin: 9.9 g/dL — ABNORMAL LOW (ref 12.0–15.0)
MCH: 22.8 pg — ABNORMAL LOW (ref 26.0–34.0)
MCHC: 31.7 g/dL (ref 30.0–36.0)
Platelets: 253 10*3/uL (ref 150–400)
RBC: 4.34 MIL/uL (ref 3.87–5.11)

## 2011-04-27 LAB — COMPREHENSIVE METABOLIC PANEL
ALT: 20 U/L (ref 0–35)
AST: 13 U/L (ref 0–37)
Alkaline Phosphatase: 64 U/L (ref 39–117)
CO2: 31 mEq/L (ref 19–32)
Calcium: 8.1 mg/dL — ABNORMAL LOW (ref 8.4–10.5)
Potassium: 3 mEq/L — ABNORMAL LOW (ref 3.5–5.1)
Sodium: 141 mEq/L (ref 135–145)
Total Protein: 7.1 g/dL (ref 6.0–8.3)

## 2011-04-27 LAB — CARDIAC PANEL(CRET KIN+CKTOT+MB+TROPI)
CK, MB: 2.5 ng/mL (ref 0.3–4.0)
Relative Index: 1.4 (ref 0.0–2.5)
Total CK: 175 U/L (ref 7–177)
Troponin I: <0.3 ng/mL (ref <0.30)

## 2011-04-27 LAB — URINALYSIS, ROUTINE W REFLEX MICROSCOPIC
Ketones, ur: NEGATIVE mg/dL
Protein, ur: NEGATIVE mg/dL
Specific Gravity, Urine: 1.01 (ref 1.005–1.030)
pH: 7 (ref 5.0–8.0)

## 2011-04-27 LAB — CK TOTAL AND CKMB (NOT AT ARMC): Relative Index: 1.4 (ref 0.0–2.5)

## 2011-04-27 LAB — HEMOGLOBIN A1C
Hgb A1c MFr Bld: 8 % — ABNORMAL HIGH (ref <5.7)
Mean Plasma Glucose: 183 mg/dL — ABNORMAL HIGH (ref <117)

## 2011-04-27 LAB — TROPONIN I: Troponin I: <0.3 ng/mL (ref <0.30)

## 2011-04-28 LAB — BASIC METABOLIC PANEL
BUN: 10 mg/dL (ref 6–23)
CO2: 31 mEq/L (ref 19–32)
Calcium: 8.1 mg/dL — ABNORMAL LOW (ref 8.4–10.5)
Chloride: 104 mEq/L (ref 96–112)
Creatinine, Ser: 0.61 mg/dL (ref 0.50–1.10)
GFR calc Af Amer: >60 mL/min (ref >60)
GFR calc non Af Amer: >60 mL/min (ref >60)
Glucose, Bld: 145 mg/dL — ABNORMAL HIGH (ref 70–99)
Potassium: 3.7 mEq/L (ref 3.5–5.1)
Sodium: 143 mEq/L (ref 135–145)

## 2011-04-28 LAB — CBC
MCH: 23.3 pg — ABNORMAL LOW (ref 26.0–34.0)
MCHC: 32.6 g/dL (ref 30.0–36.0)
RDW: 19.3 % — ABNORMAL HIGH (ref 11.5–15.5)

## 2011-04-28 LAB — LIPID PANEL
HDL: 47 mg/dL (ref >39)
LDL Cholesterol: 91 mg/dL (ref 0–99)
Triglycerides: 229 mg/dL — ABNORMAL HIGH (ref <150)
VLDL: 46 mg/dL — ABNORMAL HIGH (ref 0–40)

## 2011-04-28 LAB — HEPARIN LEVEL (UNFRACTIONATED): Heparin Unfractionated: 0.27 IU/mL — ABNORMAL LOW (ref 0.30–0.70)

## 2011-04-29 ENCOUNTER — Inpatient Hospital Stay (HOSPITAL_COMMUNITY): Payer: Medicaid Other

## 2011-04-29 LAB — BASIC METABOLIC PANEL
BUN: 11 mg/dL (ref 6–23)
Chloride: 102 mEq/L (ref 96–112)
GFR calc Af Amer: >60 mL/min (ref >60)
Glucose, Bld: 134 mg/dL — ABNORMAL HIGH (ref 70–99)
Potassium: 3.9 mEq/L (ref 3.5–5.1)
Sodium: 140 mEq/L (ref 135–145)

## 2011-04-29 LAB — GLUCOSE, CAPILLARY: Glucose-Capillary: 104 mg/dL — ABNORMAL HIGH (ref 70–99)

## 2011-04-29 LAB — IRON AND TIBC
Iron: 41 ug/dL — ABNORMAL LOW (ref 42–135)
TIBC: 262 ug/dL (ref 250–470)

## 2011-04-29 LAB — CBC
HCT: 28.9 % — ABNORMAL LOW (ref 36.0–46.0)
MCV: 72.1 fL — ABNORMAL LOW (ref 78.0–100.0)
Platelets: 237 10*3/uL (ref 150–400)
RBC: 4.01 MIL/uL (ref 3.87–5.11)
WBC: 5.3 10*3/uL (ref 4.0–10.5)

## 2011-04-29 LAB — VITAMIN B12: Vitamin B-12: 340 pg/mL (ref 211–911)

## 2011-04-30 ENCOUNTER — Inpatient Hospital Stay (HOSPITAL_COMMUNITY): Payer: Medicaid Other

## 2011-04-30 LAB — PROTIME-INR: Prothrombin Time: 12.8 seconds (ref 11.6–15.2)

## 2011-04-30 LAB — GLUCOSE, CAPILLARY: Glucose-Capillary: 116 mg/dL — ABNORMAL HIGH (ref 70–99)

## 2011-04-30 LAB — HEPARIN LEVEL (UNFRACTIONATED): Heparin Unfractionated: 1.59 IU/mL — ABNORMAL HIGH (ref 0.30–0.70)

## 2011-04-30 MED ORDER — TECHNETIUM TC 99M TETROFOSMIN IV KIT
10.0000 | PACK | Freq: Once | INTRAVENOUS | Status: AC | PRN
  Administered 2011-04-30: 10 via INTRAVENOUS

## 2011-04-30 MED ORDER — TECHNETIUM TC 99M TETROFOSMIN IV KIT
30.0000 | PACK | Freq: Once | INTRAVENOUS | Status: AC | PRN
  Administered 2011-04-30: 30 via INTRAVENOUS

## 2011-05-01 LAB — GLUCOSE, CAPILLARY
Glucose-Capillary: 124 mg/dL — ABNORMAL HIGH (ref 70–99)
Glucose-Capillary: 126 mg/dL — ABNORMAL HIGH (ref 70–99)

## 2011-05-01 NOTE — H&P (Signed)
Chelsea Lester, Chelsea Lester NO.:  [id number]  MEDICAL RECORD NO.:  [id number]  LOCATION:  3711                         FACILITY:  [facility]  PHYSICIAN:  Ricki Rodriguez, M.D.  DATE OF BIRTH:  09-17-1956  DATE OF ADMISSION:  04/27/2011 DATE OF DISCHARGE:                             HISTORY & PHYSICAL   CHIEF COMPLAINT:  Chest pain.  HISTORY OF PRESENT ILLNESS:  This is a 55 year old black female with history of hypertension, type 2 diabetes, hyperlipidemia, morbid obesity, obstructive sleep apnea, mild 1 vessel coronary artery disease has 1-day history of severe substernal chest pain.  The patient had mild chest pain and cough and cold x1 week.  At this time, the chest pain radiated to the left arm and had some shortness of breath.  PAST MEDICAL HISTORY:  Positive for hypertension, type 2 diabetes, hyperlipidemia, bronchial asthma, morbid obesity, nonobstructive coronary artery disease, depressive disorder, esophageal stricture with status post dilatation, exophthalmos bilaterally, status post lateral tarsorrhaphy, history of microcytic anemia, obstructive sleep apnea, bilateral feet cellulitis, gout, history of syncope, and sickle cell trait.  HOME MEDICATIONS: 1. Citalopram 10 mg daily. 2. Iron sulfate 325 mg daily. 3. Humulin 70/30, 30-50 units twice daily. 4. Advair 1 puff twice daily. 5. Allopurinol 300 mg daily. 6. Xanax 0.5 mg daily. 7. Aspirin 81 mg daily. 8. Colchicine 0.6 mg twice daily. 9. Losartan 100 mg daily. 10.Lasix 40 mg daily. 11.Metformin 500 mg at bedtime. 12.Metoprolol 50 mg twice daily. 13.Nitroglycerin sublingual every 5 minutes x3 as needed for chest     pain. 14.Dexilant 60 mg twice daily. 15.Simvastatin 40 mg at bedtime. 16.Travatan 0.004%, 1 drop both eyes at bedtime. 17.Albuterol inhaler 2 puffs 4 times daily as needed. 18.Singulair 10 mg daily as needed. 19.Claritin 10 mg daily.  SOCIAL HISTORY:  The patient lives in  [place].  He is unemployed, has 2 children.  No history of tobacco abuse.  No alcohol use.  No IV drug use.  The patient is married.  FAMILY HISTORY:  Father died at age 28 from lung cancer.  He also had coronary artery disease.  Mother died at age 67 from breast cancer.  The patient has 5 sisters, one of the sister has died from breast cancer.  REVIEW OF SYSTEMS:  Positive weight gain, positive vision disturbance. Negative hearing loss.  Negative pneumonia.  Positive shortness of breath.  Positive chest pain.  Positive GI bleed.  Negative hepatitis. Negative kidney stone.  Negative strokes, seizures, or psychiatric admissions.  PHYSICAL EXAMINATION:  VITAL SIGNS:  Temperature 98, blood pressure 140s/80s, pulse 80s, respirations 16, oxygen saturation 100% on 2 liters of oxygen.  GENERAL:  The patient is well-built, well-nourished female in no acute distress. HEENT:  The patient is normocephalic, atraumatic with eyes brown, conjunctivae pink.  Sclerae nonicteric. NECK:  Supple. LUNGS:  Clear bilaterally.  Chest wall tender on palpation. HEART:  Normal S1, S2.  No murmur, gallop, or rub. ABDOMEN:  Soft, nontender. EXTREMITIES:  Trace edema, no cyanosis or clubbing. NEUROLOGICAL:  The patient is alert and oriented x3.  Cranial nerves II- XII grossly intact.  LABORATORY DATA:  Normal WBC count, platelet count, hemoglobin low at 9.9, hematocrit  31.2.  Sodium 141, potassium low at 3.0, glucose 195, BUN 12, creatinine 0.58.  Liver enzymes normal.  Calcium 8.1, total CK slightly high at 208, MB normal at 2.9, troponin-I less than 0.30. Urinalysis unremarkable.  Chest x-ray, mild left basilar atelectasis, otherwise unremarkable.  EKG, normal sinus rhythm with nonspecific ST abnormality.  IMPRESSION: 1. Chest pain, rule out coronary artery disease. 2. Diabetes mellitus type 2. 3. Hypertension. 4. Obesity. 5. Depression.  PLAN:  Place the patient in telemetry bed.  Cycle cardiac  enzymes q.8 h., consider medical therapy as much as possible, cardiac interventions if needed.     Ricki Rodriguez, M.D.     ASK/MEDQ  D:  04/27/2011  T:  04/28/2011  Job:  [id number]  Electronically Signed by Orpah Cobb M.D. on 05/01/2011 02:42:38 PM

## 2011-05-02 NOTE — Discharge Summary (Signed)
Chelsea Lester, Chelsea Lester NO.:  [id number]  MEDICAL RECORD NO.:  [id number]  LOCATION:  3711                         FACILITY:  [facility]  PHYSICIAN:  Ricki Rodriguez, M.D.  DATE OF BIRTH:  06-Jul-1956  DATE OF ADMISSION:  04/27/2011 DATE OF DISCHARGE:  05/01/2011                              DISCHARGE SUMMARY   FINAL DIAGNOSES: 1. Chest pain. 2. Hypertension. 3. Diabetes mellitus type 2. 4. Asthma. 5. Gout. 6. Obesity. 7. Peripheral neuropathy. 8. Glaucoma.  DISCHARGE MEDICATIONS: 1. Potassium 20 mEq one daily. 2. Novolin 70/30 30-50 units subcutaneously twice daily. 3. Simvastatin 20 mg at bedtime. 4. Albuterol inhaler 2 puffs 4 times daily as needed. 5. Albuterol nebulizations 0.083% one nebulization inhaled every 6     hours as needed. 6. Xanax 0.5 mg 1 tablet twice daily as needed. 7. Atrovent inhaler 2 puffs 4 times daily. 8. Advair 1 puff twice daily. 9. Allopurinol 300 mg one in the morning. 10.Citalopram 10 mg one at bedtime. 11.Colchicine 0.6 mg one twice daily as needed. 12.Cozaar 100 mg one in the morning. 13.Ferrous sulfate 325 mg one daily. 14.Gabapentin 300 mg 1-3 capsules daily at bedtime. 15.Hydrochlorothiazide 12.5 mg one in the morning. 16.Lasix 40 mg one in the morning. 17.Metformin 500 mg one in the morning. 18.Metoprolol tartrate 50 mg one twice daily. 19.Nitroglycerin 0.4 mg one under the tongue every 5 minutes x3 as     needed for chest pain. 20.Nystatin oral suspension 1 teaspoon by mouth 4 times daily as     directed. 21.Omeprazole 20 mg one twice daily. 22.Oxycodone 5 mg twice daily as needed. 23.Tobramycin ophthalmic solution 1 drop 4 times daily. 24.Travatan ophthalmic solution 1 drop both eyes at bedtime. 25.Vitamin D 50,000 units 1 capsule once a week. 26.The patient to discontinue prednisolone ophthalmic suspension and     Flonase nasal spray unless directed by Ophthalmology and ENT     doctor.  DISCHARGE DIET:   Low-sodium, heart-healthy diet along with carb- modified, low-calorie diet.  ACTIVITY:  The patient to increase activity as tolerated.  CONDITION ON DISCHARGE:  Improved.  WOUND CARE INSTRUCTIONS:  The patient to notify right groin pain swelling or discharge.  HISTORY:  This 55 year old black female presented with substernal chest pain of 1-day duration.  Chest pain was increased with breathing.  She also had cough and cold x1 week.  The patient mentioned chest pain radiated to the left arm and had some shortness of breath.  PHYSICAL EXAMINATION:  VITAL SIGNS:  Temperature 98, blood pressure 140/80, pulse 80, respirations 16, oxygen saturation 100% on 2 liters of oxygen. GENERAL:  The patient is well-built, well-nourished female in no acute distress. HEENT:  The patient is normocephalic, atraumatic with brown eyes. Conjunctivae pink.  Sclerae are nonicteric. NECK:  Supple. LUNGS:  Clear bilaterally.  Chest wall tender on palpation of the left precordial area. HEART:  Normal S1 and S2.  No murmur, gallop, or rub. ABDOMEN:  Soft and nontender. EXTREMITIES:  Trace edema.  No cyanosis or clubbing. NEUROLOGICALLY:  The patient is alert and oriented x3.  Cranial nerves II-XII grossly intact.  The patient moves all 4 extremities.  LABORATORY DATA:  Normal WBC  count, platelet count, hemoglobin chronically low at 9.9, hematocrit 31.2.  Sodium 141, potassium 3.0, subsequent potassium was up to 3.9, glucose 195, BUN 12, creatinine 0.58.  Liver enzymes unremarkable.  Calcium 8.1, total CK slightly high, but MB and troponin I negative x3. Chest x-ray, mild left basilar atelectasis, otherwise unremarkable. EKG normal sinus rhythm with nonspecific ST abnormality. A nuclear stress test positive for inferior wall ischemic changes. Cardiac catheterization failed to show any significant coronary artery disease with a preserved LV systolic function.  HOSPITAL COURSE:  The patient was admitted  to telemetry unit. Myocardial infarction was ruled out.  She underwent nuclear stress test that showed inferior wall ischemia.  She underwent cardiac catheterization that failed to show any significant coronary artery disease.  The patient's hypokalemia was corrected with potassium supplement.  Her overall condition improved and she was discharged home on May 01, 2011, in satisfactory condition with followup by me in 1 month.     Ricki Rodriguez, M.D.     ASK/MEDQ  D:  05/01/2011  T:  05/02/2011  Job:  [id number]  Electronically Signed by Orpah Cobb M.D. on 05/02/2011 01:18:35 PM

## 2011-05-08 ENCOUNTER — Ambulatory Visit: Payer: Medicaid Other | Admitting: Gastroenterology

## 2011-05-17 ENCOUNTER — Telehealth: Payer: Self-pay | Admitting: Gastroenterology

## 2011-05-18 NOTE — Telephone Encounter (Signed)
Pt states she has been having a lot of problems with abdominal pain and requests an appt. Also states she is having severe heartburn. Pt scheduled to see Amy Esterwood PA 05/22/11@2pm . Pt aware of appt date and time.

## 2011-05-22 ENCOUNTER — Encounter: Payer: Self-pay | Admitting: Physician Assistant

## 2011-05-22 ENCOUNTER — Ambulatory Visit (INDEPENDENT_AMBULATORY_CARE_PROVIDER_SITE_OTHER): Payer: Medicaid Other | Admitting: Physician Assistant

## 2011-05-22 DIAGNOSIS — R1013 Epigastric pain: Secondary | ICD-10-CM

## 2011-05-22 DIAGNOSIS — R1310 Dysphagia, unspecified: Secondary | ICD-10-CM

## 2011-05-22 DIAGNOSIS — K625 Hemorrhage of anus and rectum: Secondary | ICD-10-CM

## 2011-05-22 MED ORDER — DICYCLOMINE HCL 10 MG PO CAPS: ORAL_CAPSULE | ORAL | Status: DC

## 2011-05-22 MED ORDER — HYDROCORTISONE ACETATE 25 MG RE SUPP: RECTAL | Status: DC

## 2011-05-22 MED ORDER — RABEPRAZOLE SODIUM 20 MG PO TBEC: DELAYED_RELEASE_TABLET | ORAL | Status: DC

## 2011-05-22 NOTE — Progress Notes (Signed)
Subjective:    Patient ID: Chelsea Lester, female    DOB: [birth year], 55 y.o.   MRN: [id number]  HPI Chelsea Lester is a 55 year old female known to Dr. Arlyce Dice, with history of chronic GERD and a previous dilation of esophageal stricture. She also has diverticulosis and history of adenomatous colon polyps. Her last colonoscopy was in January of 2011. She had endoscopy with Elease Hashimoto dilation of the esophagus in February of 2012 for a distal stricture. Prior to that she had had an endoscopy with empiric dilation but no stricture noted. She also has history of morbid obesity and insulin-dependent diabetes hypertension sickle cell trait and apparent recent diagnosis of lupus. She had a recent hospitalization for chest pain 622 through 05/01/2011 she had a positive stress test, then cardiac catheterization which was negative for any significant coronary artery disease.  The patient says that she mentioned that she been having abdominal pain and dysphagia while she was in the hospital and I was asked to follow up here. She states that she is still having dysphagia to both solids and liquids and says it's occurring with almost every meal at this time and occasionally she has to regurgitate. She does not feel that the esophageal dilation in February helped her at all. She is currently on Pepcid 40 mg once daily. She says she is having ongoing problems with  heartburn and indigestion as well as reflux.    . She also mentions 3 episodes of small amounts of bright red blood per rectum over the past week or so she has had some rectal discomfort recently and says that she has had hemorrhoids in the past. She has been having frequent loose stools which she is attributing to her irritable bowel. She last saw blood yesterday. She also has ongoing complaints of primarily upper abdominal discomfort some cramping and urgency postprandially. We did an abdominal ultrasound in May of 2012 which was negative and she had a CT scan of  the abdomen and pelvis done through the emergency room on 04/10/2011 which was also negative.    Review of Systems  Constitutional: Negative.   HENT: Positive for trouble swallowing.   Eyes: Negative.   Respiratory: Negative.   Cardiovascular: Negative.   Gastrointestinal: Positive for abdominal pain.  Genitourinary: Negative.   Musculoskeletal: Negative.   Skin: Negative.   Neurological: Negative.   Hematological: Negative.   Psychiatric/Behavioral: Negative.    Outpatient Prescriptions Prior to Visit  Medication Sig Dispense Refill  . albuterol (PROVENTIL,VENTOLIN) 90 MCG/ACT inhaler Inhale 2 puffs into the lungs every 6 (six) hours as needed.        . ALPRAZolam (XANAX) 0.5 MG tablet Take 0.5 mg by mouth at bedtime as needed.        Marland Kitchen aspirin 81 MG tablet Take 81 mg by mouth daily.        . ergocalciferol (VITAMIN D2) 50000 UNITS capsule Take 50,000 Units by mouth once a week.        . famotidine (PEPCID) 40 MG tablet TAKE 1 TABLET EACH DAY  30 tablet  12  . fenofibrate (TRICOR) 145 MG tablet Take 145 mg by mouth daily.        . ferrous sulfate 325 (65 FE) MG tablet Take 325 mg by mouth daily with breakfast.        . fluticasone-salmeterol (ADVAIR HFA) 115-21 MCG/ACT inhaler Inhale 2 puffs into the lungs as directed.        Marland Kitchen HYDROcodone-acetaminophen (VICODIN) 5-500 MG per tablet Take  1 tablet by mouth every 6 (six) hours as needed.        . insulin NPH-insulin regular (NOVOLIN 70/30) (70-30) 100 UNIT/ML injection Inject 55 Units into the skin every morning. 40 units every evening       . Insulin Syringes, Disposable, U-100 1 ML MISC by Does not apply route.        Marland Kitchen ipratropium (ATROVENT) 0.02 % nebulizer solution Take 500 mcg by nebulization 3 (three) times daily.        . Lancets MISC by Does not apply route.        . loratadine (CLARITIN) 10 MG tablet Take 10 mg by mouth daily.        . metoprolol (LOPRESSOR) 50 MG tablet Take 50 mg by mouth 2 (two) times daily.        .  NON FORMULARY Truetrack test strip( Glucose Blood)       . simvastatin (ZOCOR) 40 MG tablet Take 40 mg by mouth at bedtime.        Marland Kitchen tobramycin (TOBREX) 0.3 % ophthalmic ointment Place 1 application into both eyes 4 (four) times daily.        . travoprost, benzalkonium, (TRAVATAN) 0.004 % ophthalmic solution Place 1 drop into both eyes at bedtime.        . cephALEXin (KEFLEX) 250 MG capsule Take 250 mg by mouth every 6 (six) hours.        . hyoscyamine (LEVBID) 0.375 MG 12 hr tablet Take 1 tablet (0.375 mg total) by mouth every 12 (twelve) hours as needed.  60 tablet  1   Allergies  Allergen Reactions  . Percocet (Oxycodone-Acetaminophen)        Objective:   Physical Exam Well-developed African American female obese in no acute distress somewhat sleepy, pleasant, alert oriented x3 HEENT nontraumatic normocephalic EOMI PERRLA sclera anicteric Neck; Supple no JVD, Cardiovascular; regular rate and rhythm with S1-S2 no murmur rub or gallop, Pulmonary; clear bilaterally, Abdomen; large soft bowel sounds active, no palpable masses or hepatosplenomegaly, mildly tender in the epigastrium no guarding no rebound Rectal; exam no external lesion noted, no fissure felt on digital exam on anoscopy she does have a small inflamed internal hemorrhoid stool is brown and trace heme positive. Extremities; benign no edema, psych; mood and affect appropriate.        Assessment & Plan:  #43 55 year old female with multiple comorbidities with persistent complaint of both solid and liquid food dysphagia, as well as acid reflux with hoarseness. Patient had esophageal dilation done in February 2012 which was of no benefit. Rule out persistent distal esophageal stricture, rule out underlying motility disorder.  Plan; stop Pepcid Start AcipHex 20 mg by mouth twice daily x2 weeks then once daily in a.m. Schedule for barium swallow, decision for repeat endoscopy pending barium swallow results  #2 Self-limited small  volume hematochezia, felt secondary to small internal hemorrhoid Patient is up-to-date with colon screening Start Anusol-HC suppositories each bedtime times one week then as needed.  #3 IBS with complaint of upper and generalized abdominal discomfort intermittent cramping and postprandial urgency. As she is an insulin-dependent diabetic consider component of visceral neuropathy  We'll start trial of Bentyl 10 mg 3 times daily before each meal. Patient will followup with Dr. Arlyce Dice.

## 2011-05-22 NOTE — Patient Instructions (Signed)
We have scheduled a Barium Swallow test at [facility] for 05-24-2011.Thursday. Arrive at 8:45 Am .  Have nothing by mouth 3 hours before the test.  We sent prescriptions for Aciphex ( samples provided.),  Bentyl for abdominal pain and cramping, and Anusol HC Suppositories.    Barium Swallow  A barium swallow is an x-ray examination. It evaluates only the area at the back of the throat (pharynx) and the "food pipe" that leads to the stomach (esophagus). For this study, the patient swallows a white chalky liquid called "barium". Barium blocks standard x-ray beams, It looks much different on x-ray film when compared to nearby organs. Because of the way barium works, it is often called a "contrast material". It is an easy, fast, and safe method for getting x-ray pictures that can identify possible problems. A barium swallow helps in the evaluation of some digestive functions and to detect:  Ulcers.   Tumors.   Inflammation of the esophagus.   Hiatal hernias ( the upper portion of the stomach protrudes into the chest cavity through an opening of the diaphragm).   Scarring.   Blockages.   Problems with muscular wall of the pharynx and esophagus.  The procedure is also used to help diagnose symptoms such as:  Difficulty swallowing.   Chest and abdominal pain.   Reflux (a backward flow of partially digested food and digestive juices).   Unexplained vomiting.   Severe indigestion.  BEFORE THE PROCEDURE To ensure the best possible image quality, your stomach should be empty of food. You will likely be asked not to eat or drink anything and to refrain from chewing gum and smoking after midnight on the day of the examination. Your caregiver will provide specific instructions about taking or not taking regular prescription medications on the day of test. You may be asked to remove some or all of your clothes and to wear a gown during the exam. You may also be asked to  remove jewelry, eye glasses and any metal objects or clothing that might interfere with the x-ray images. LET YOUR CAREGIVER KNOW ABOUT:  Allergies, especially to contrast materials.  Medications taken including herbs, eye drops, over the counter medications, and creams.   Use of steroids (by mouth or creams).   Possible pregnancy, if applicable.   History of blood clots (thrombophlebitis).  History of bleeding or blood problems.   Previous surgery.   Other health problems.   THE PROCEDURE The patient drinks the liquid barium, which looks like a light-colored milkshake. The radiologist is watching the barium pass through the digestive tract on a fluoroscope. This is a device that projects x-ray images in a movie-like sequence onto a monitor. The exam table will be positioned at different angles and the abdomen may be compressed to help spread the barium. Once the esophagus is coated with the barium, still x-ray pictures are taken and stored for further review. It is important to hold still while the x-ray picture is taken. That will reduce the chances of a blurred image.  RISKS & COMPLICATIONS OF THE PROCEDURE  There is RARE chance of cancer from radiation exposure. However, the benefit of an accurate diagnosis far outweighs the risk.   Some patients may be allergic to the flavoring added to some brands of barium. If you have experienced allergic reactions after eating chocolate, certain berries or citrus fruit, be sure to tell your caregiver or the technologist before the procedure.   There is a RARE chance  that some barium could be retained leading to a blockage of the digestive system. Patients who have an obstruction in the gastrointestinal tract should not undergo this exam.   Women should always inform their physician or x-ray technologist if there is any possibility that they are pregnant.  AFTER THE PROCEDURE The barium may color your stools gray or white for 48 to 72 hours after  the procedure. Sometimes the barium can cause temporary constipation. This condition is usually treated by an over-the-counter stool softener or laxative. Drinking larger than normal amounts of fluids following the test can also help.  HOME CARE INSTRUCTIONS After the examination, you can resume a regular diet and take orally administered medications unless told otherwise by your doctor.  SEEK MEDICAL CARE IF:  If you are unable to have a bowel movement.   If your bowel habits undergo any significant changes following the exam, you should contact your physician.   You experience worsening problems with swallowing, food "getting stuck", nausea, or pain.  SEEK IMMEDIATE MEDICAL CARE IF:  You develop worsening abdominal pain.   You develop vomiting.   You develop any chest pain, lightheadedness, unusual sweating.   You develop weakness, or you faint.  Document Released: 03/05/2007 Document Re-Released: 07/31/2008 [facility] Patient Information 2011 [place], [place].

## 2011-05-23 NOTE — Progress Notes (Signed)
Reviewed and agree with management. Torien Ramroop D. Durell Lofaso, M.D., FACG  

## 2011-05-24 ENCOUNTER — Ambulatory Visit (HOSPITAL_COMMUNITY)
Admission: RE | Admit: 2011-05-24 | Discharge: 2011-05-24 | Disposition: A | Discharge status: Home / Self Care | Payer: Medicaid Other | Source: Ambulatory Visit | Attending: Physician Assistant | Admitting: Physician Assistant

## 2011-05-24 DIAGNOSIS — K224 Dyskinesia of esophagus: Secondary | ICD-10-CM | POA: Insufficient documentation

## 2011-05-24 DIAGNOSIS — R1310 Dysphagia, unspecified: Secondary | ICD-10-CM | POA: Insufficient documentation

## 2011-05-24 DIAGNOSIS — R490 Dysphonia: Secondary | ICD-10-CM | POA: Insufficient documentation

## 2011-05-25 ENCOUNTER — Telehealth: Payer: Self-pay | Admitting: *Deleted

## 2011-05-25 NOTE — Telephone Encounter (Signed)
Tried to call pt with Barium Swallow results and was going to tell  Her Dr Arlyce Dice wants her to have an EGD with balloon dilation.  I will try her on Monday 05-28-11.

## 2011-05-28 ENCOUNTER — Telehealth: Payer: Self-pay | Admitting: *Deleted

## 2011-05-28 NOTE — Telephone Encounter (Signed)
I left a message for the patient's daughter Dalexa Gentz on her number, cell phone, [phone].  I asked her to please have her mother call me regarding the Barium Swallow test results. I also advised her that Dr. Arlyce Dice wants her to have an EGD w/ Balloon Dilitation. This would be scheduled at [facility]. I left my name ane our number for the pt to call me.

## 2011-05-29 NOTE — Telephone Encounter (Signed)
LM again for Chelsea Lester @ [phone], today 05-29-11.  I advised I needed a good number for her mother and  I need to advise her mother of the results of the Barium Swallow and that Dr. Arlyce Dice wants to do an EGD w/ balloon dilitation.

## 2011-05-30 NOTE — Progress Notes (Signed)
Pt called me back to report her phone is not currently on and she is using her daughter's , Tomasita Beevers, [phone].  I gave her the Barium Swallow results and advised Dr. Arlyce Dice wants her to have an EGD w/ balloon Dilitation.  She allowed me to schedule her for the hospital EGD w/ balloon dilitation  On 06-20-11 andand the previsit for 06-12-11.  The pt was willing to come for the previsit due to being on numerous medications and diabetic being on oral medication and insulin.

## 2011-05-31 NOTE — Telephone Encounter (Signed)
Patient called me from her daughters number.  Her phone is not on at the present time.  The patient did want to schedule what Dr. Arlyce Dice suggested.  Her previsit at our office is on 06-12-2011 and the Hospital EGD w/ balloon dilitation is scheduled for 06-20-11 at 8:30 AM.

## 2011-05-31 NOTE — Telephone Encounter (Signed)
Pt called me from her daughter's number.  The pt's phone is not working . I explained the Barium Swallow results and Dr. Marzetta Board suggestion that she have an EGD w/ Balloon dilitation.  She did schedule with me a previsit on 06-12-11 at 8AM and the procedure at the hospital 06-20-11  At 8:30 AM.

## 2011-06-12 ENCOUNTER — Telehealth: Payer: Self-pay | Admitting: *Deleted

## 2011-06-12 NOTE — Telephone Encounter (Signed)
[facility] previsit for 06/15/11

## 2011-06-15 ENCOUNTER — Ambulatory Visit (AMBULATORY_SURGERY_CENTER): Payer: Medicaid Other | Admitting: *Deleted

## 2011-06-15 VITALS — Ht 67.0 in | Wt 297.5 lb

## 2011-06-15 DIAGNOSIS — R1310 Dysphagia, unspecified: Secondary | ICD-10-CM

## 2011-06-20 ENCOUNTER — Other Ambulatory Visit: Payer: Self-pay | Admitting: Gastroenterology

## 2011-06-20 ENCOUNTER — Ambulatory Visit (HOSPITAL_COMMUNITY)
Admission: RE | Admit: 2011-06-20 | Discharge: 2011-06-20 | Disposition: A | Discharge status: Home / Self Care | Payer: Medicaid Other | Source: Ambulatory Visit | Attending: Gastroenterology | Admitting: Gastroenterology

## 2011-06-20 ENCOUNTER — Encounter: Payer: Medicaid Other | Admitting: Gastroenterology

## 2011-06-20 DIAGNOSIS — K222 Esophageal obstruction: Secondary | ICD-10-CM

## 2011-06-20 DIAGNOSIS — K2960 Other gastritis without bleeding: Secondary | ICD-10-CM | POA: Insufficient documentation

## 2011-06-20 DIAGNOSIS — K2990 Gastroduodenitis, unspecified, without bleeding: Secondary | ICD-10-CM

## 2011-06-20 DIAGNOSIS — K2970 Gastritis, unspecified, without bleeding: Secondary | ICD-10-CM

## 2011-06-20 DIAGNOSIS — R1310 Dysphagia, unspecified: Secondary | ICD-10-CM | POA: Insufficient documentation

## 2011-06-20 DIAGNOSIS — Z01812 Encounter for preprocedural laboratory examination: Secondary | ICD-10-CM | POA: Insufficient documentation

## 2011-06-20 LAB — GLUCOSE, CAPILLARY: Glucose-Capillary: 114 mg/dL — ABNORMAL HIGH (ref 70–99)

## 2011-07-05 ENCOUNTER — Ambulatory Visit: Payer: Medicaid Other | Admitting: Gastroenterology

## 2011-07-25 ENCOUNTER — Ambulatory Visit (INDEPENDENT_AMBULATORY_CARE_PROVIDER_SITE_OTHER): Payer: Medicaid Other | Admitting: Gastroenterology

## 2011-07-25 ENCOUNTER — Encounter: Payer: Self-pay | Admitting: Gastroenterology

## 2011-07-25 DIAGNOSIS — Z800 Family history of malignant neoplasm of digestive organs: Secondary | ICD-10-CM | POA: Insufficient documentation

## 2011-07-25 DIAGNOSIS — R1013 Epigastric pain: Secondary | ICD-10-CM | POA: Insufficient documentation

## 2011-07-25 DIAGNOSIS — K222 Esophageal obstruction: Secondary | ICD-10-CM

## 2011-07-25 DIAGNOSIS — K219 Gastro-esophageal reflux disease without esophagitis: Secondary | ICD-10-CM

## 2011-07-25 LAB — BASIC METABOLIC PANEL
BUN: 16
CO2: 30
Calcium: 9.3
Chloride: 95 — ABNORMAL LOW
Creatinine, Ser: 1.08
GFR calc Af Amer: >60
GFR calc non Af Amer: 53 — ABNORMAL LOW
Glucose, Bld: 628
Potassium: 3.9
Sodium: 135

## 2011-07-25 LAB — COMPREHENSIVE METABOLIC PANEL
ALT: 14
AST: 12
CO2: 32
Chloride: 104
GFR calc non Af Amer: >60
Total Bilirubin: 0.2 — ABNORMAL LOW

## 2011-07-25 LAB — RAPID URINE DRUG SCREEN, HOSP PERFORMED
Amphetamines: NOT DETECTED
Barbiturates: NOT DETECTED
Benzodiazepines: NOT DETECTED

## 2011-07-25 LAB — URINE MICROSCOPIC-ADD ON: WBC, UA: NONE SEEN

## 2011-07-25 LAB — CBC
HCT: 34.7 — ABNORMAL LOW
Hemoglobin: 11.3 — ABNORMAL LOW
MCHC: 32.4
MCV: 70.7 — ABNORMAL LOW
Platelets: 273
RBC: 4.91
RDW: 16.7 — ABNORMAL HIGH
WBC: 6.6

## 2011-07-25 LAB — URINALYSIS, ROUTINE W REFLEX MICROSCOPIC
Bilirubin Urine: NEGATIVE
Glucose, UA: >1000 — AB
Hgb urine dipstick: NEGATIVE
Ketones, ur: NEGATIVE
Leukocytes, UA: NEGATIVE
Nitrite: NEGATIVE
Protein, ur: NEGATIVE
Specific Gravity, Urine: 1.036 — ABNORMAL HIGH
Urobilinogen, UA: 0.2
pH: 6.5

## 2011-07-25 LAB — CULTURE, BLOOD (ROUTINE X 2)

## 2011-07-25 LAB — POCT CARDIAC MARKERS
CKMB, poc: <1 — ABNORMAL LOW
Myoglobin, poc: 99.3
Troponin i, poc: <0.05

## 2011-07-25 LAB — DIFFERENTIAL
Basophils Absolute: 0
Basophils Relative: 0
Eosinophils Absolute: 0
Eosinophils Relative: 1
Lymphocytes Relative: 34
Lymphs Abs: 2.3
Monocytes Absolute: 0.3
Monocytes Relative: 5
Neutro Abs: 4
Neutrophils Relative %: 60

## 2011-07-25 LAB — PROTIME-INR
INR: 1
Prothrombin Time: 13.3

## 2011-07-25 LAB — CULTURE, ROUTINE-ABSCESS

## 2011-07-25 LAB — CK TOTAL AND CKMB (NOT AT ARMC)
CK, MB: 1.1
Relative Index: INVALID
Total CK: 88

## 2011-07-25 MED ORDER — HYOSCYAMINE SULFATE 0.125 MG SL SUBL: 0.2500 mg | SUBLINGUAL_TABLET | SUBLINGUAL | Status: DC | PRN

## 2011-07-25 NOTE — Assessment & Plan Note (Signed)
She has minimal breakthrough pyrosis on daily AcipHex. She is instructed to use antacids as needed.

## 2011-07-25 NOTE — Patient Instructions (Signed)
Your medication has been sent to your pharmacy Follow up as needed

## 2011-07-25 NOTE — Assessment & Plan Note (Signed)
Adenomatous polyps at colonoscopy 2011.  Recommendations #1 followup colonoscopy in 2016

## 2011-07-25 NOTE — Assessment & Plan Note (Addendum)
Improved status post balloon dilatation. 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

## 2011-07-25 NOTE — Assessment & Plan Note (Addendum)
She has nonspecific abdominal discomfort.  Medications #1 hyomax sublingual when necessary

## 2011-07-25 NOTE — Progress Notes (Signed)
History of Present Illness: Chelsea Lester has returned for evaluation of abdominal discomfort.  Endoscopy in July, 2012 was pertinent for a few gastric erosions. A questionable stricture was dilated with a balloon dilator.  She no longer has dysphagia. She has occasional breakthrough pyrosis. Her main complaint is crampy upper abdominal pain that is fairly frequent and often occurs in the evenings. Colonoscopy in January, 2011 was  pertinent for a colon polyp which was removed.      Review of Systems: Pertinent positive and negative review of systems were noted in the above HPI section. All other review of systems were otherwise negative.    Current Medications, Allergies, Past Medical History, Past Surgical History, Family History and Social History were reviewed in [facility] electronic medical record  Vital signs were reviewed in today's medical record. Physical Exam: General: Well developed , well nourished, no acute distress

## 2011-08-02 LAB — CBC
Hemoglobin: 10.4 — ABNORMAL LOW
MCV: 68.5 — ABNORMAL LOW
Platelets: 278
RBC: 4.74
RDW: 19.7 — ABNORMAL HIGH
WBC: 7.1

## 2011-08-02 LAB — URINALYSIS, ROUTINE W REFLEX MICROSCOPIC
Bilirubin Urine: NEGATIVE
Glucose, UA: NEGATIVE
Ketones, ur: NEGATIVE
Protein, ur: NEGATIVE
pH: 5.5

## 2011-08-02 LAB — POCT CARDIAC MARKERS
CKMB, poc: <1 — ABNORMAL LOW
Myoglobin, poc: 44.2
Operator id: 284251
Operator id: 284251
Troponin i, poc: <0.05

## 2011-08-02 LAB — POCT I-STAT, CHEM 8
BUN: 12
Calcium, Ion: 1.13
Chloride: 101
Creatinine, Ser: 0.9
HCT: 35 — ABNORMAL LOW
Potassium: 3.2 — ABNORMAL LOW
Sodium: 139
TCO2: 31

## 2011-08-02 LAB — D-DIMER, QUANTITATIVE: D-Dimer, Quant: <0.22

## 2011-08-07 LAB — LIPID PANEL
LDL Cholesterol: 77
Total CHOL/HDL Ratio: 3.9
Triglycerides: 123
VLDL: 25

## 2011-08-07 LAB — GLUCOSE, CAPILLARY
Glucose-Capillary: 104 — ABNORMAL HIGH
Glucose-Capillary: 111 — ABNORMAL HIGH
Glucose-Capillary: 116 — ABNORMAL HIGH
Glucose-Capillary: 89

## 2011-08-07 LAB — CBC
HCT: 31.6 — ABNORMAL LOW
HCT: 34.1 — ABNORMAL LOW
Hemoglobin: 10.6 — ABNORMAL LOW
Hemoglobin: 9.9 — ABNORMAL LOW
MCHC: 31
MCV: 68.8 — ABNORMAL LOW
MCV: 69.2 — ABNORMAL LOW
Platelets: 232
RBC: 4.5
RBC: 4.92
RDW: 20.6 — ABNORMAL HIGH
WBC: 7.3
WBC: 8.4

## 2011-08-07 LAB — CK TOTAL AND CKMB (NOT AT ARMC)
CK, MB: 1.7
CK, MB: 2.2
Relative Index: 0.9
Relative Index: 1.3

## 2011-08-07 LAB — APTT: aPTT: 70 — ABNORMAL HIGH

## 2011-08-07 LAB — COMPREHENSIVE METABOLIC PANEL
ALT: 17
Alkaline Phosphatase: 59
CO2: 29
Chloride: 99
Glucose, Bld: 136 — ABNORMAL HIGH
Potassium: 3.2 — ABNORMAL LOW
Sodium: 136
Total Bilirubin: 0.3
Total Protein: 7.2

## 2011-08-07 LAB — TSH: TSH: 0.88

## 2011-08-07 LAB — HEPARIN LEVEL (UNFRACTIONATED): Heparin Unfractionated: 0.4

## 2011-08-07 LAB — MAGNESIUM: Magnesium: 1.8

## 2011-08-16 LAB — POCT I-STAT CREATININE
Creatinine, Ser: 1.1
Operator id: 235561

## 2011-08-16 LAB — I-STAT 8, (EC8 V) (CONVERTED LAB)
Acid-Base Excess: 4 — ABNORMAL HIGH
Bicarbonate: 29.5 — ABNORMAL HIGH
Glucose, Bld: 305 — ABNORMAL HIGH
TCO2: 31
pCO2, Ven: 47
pH, Ven: 7.406 — ABNORMAL HIGH

## 2011-08-17 LAB — COMPREHENSIVE METABOLIC PANEL
ALT: 25
BUN: 13
CO2: 30
Calcium: 8.9
GFR calc non Af Amer: 56 — ABNORMAL LOW
Glucose, Bld: 140 — ABNORMAL HIGH
Sodium: 142

## 2011-08-17 LAB — CARDIAC PANEL(CRET KIN+CKTOT+MB+TROPI)
CK, MB: 2
Relative Index: 1.6
Total CK: 125
Troponin I: 0.01

## 2011-08-17 LAB — POCT CARDIAC MARKERS
CKMB, poc: 1.3
Myoglobin, poc: 70.2
Myoglobin, poc: 71.7
Myoglobin, poc: 80.8
Operator id: 277751

## 2011-08-17 LAB — BASIC METABOLIC PANEL
BUN: 10
BUN: 12
CO2: 28
Calcium: 8.5
Chloride: 102
Creatinine, Ser: 0.89
Creatinine, Ser: 0.98
GFR calc non Af Amer: >60
Glucose, Bld: 163 — ABNORMAL HIGH

## 2011-08-17 LAB — RETICULOCYTES
RBC.: 4.43
Retic Ct Pct: 1

## 2011-08-17 LAB — CBC
HCT: 32.7 — ABNORMAL LOW
Hemoglobin: 10.7 — ABNORMAL LOW
MCHC: 32.6
MCV: 69.1 — ABNORMAL LOW
Platelets: 315
RBC: 4.74
RDW: 19.5 — ABNORMAL HIGH
WBC: 7.9

## 2011-08-17 LAB — DIFFERENTIAL
Eosinophils Absolute: 0.1
Eosinophils Relative: 2
Lymphocytes Relative: 37
Monocytes Absolute: 0.4
Neutrophils Relative %: 55

## 2011-08-17 LAB — HEMOGLOBIN A1C: Hgb A1c MFr Bld: 7.6 — ABNORMAL HIGH

## 2011-08-17 LAB — FOLATE: Folate: 8.9

## 2011-08-17 LAB — IRON AND TIBC
Iron: 49
Saturation Ratios: 16 — ABNORMAL LOW
TIBC: 306
UIBC: 257

## 2011-08-17 LAB — LIPID PANEL
LDL Cholesterol: 71
Total CHOL/HDL Ratio: 3
VLDL: 11

## 2011-08-17 LAB — MAGNESIUM: Magnesium: 1.8

## 2011-08-17 LAB — FERRITIN: Ferritin: 53 (ref 10–291)

## 2011-08-17 LAB — D-DIMER, QUANTITATIVE: D-Dimer, Quant: <0.22

## 2011-08-21 LAB — I-STAT 8, (EC8 V) (CONVERTED LAB)
Acid-Base Excess: 3 — ABNORMAL HIGH
Chloride: 102
Glucose, Bld: 140 — ABNORMAL HIGH
Potassium: 3.5
TCO2: 28
pCO2, Ven: 38.8 — ABNORMAL LOW
pH, Ven: 7.453 — ABNORMAL HIGH

## 2011-08-21 LAB — POCT I-STAT CREATININE
Creatinine, Ser: 0.9
Operator id: 235561

## 2011-08-21 LAB — POCT URINALYSIS DIP (DEVICE)
Hgb urine dipstick: NEGATIVE
Nitrite: NEGATIVE
pH: 8.5 — ABNORMAL HIGH

## 2012-01-09 ENCOUNTER — Ambulatory Visit (HOSPITAL_BASED_OUTPATIENT_CLINIC_OR_DEPARTMENT_OTHER): Payer: Medicaid Other | Attending: Anesthesiology | Admitting: Radiology

## 2012-01-09 VITALS — Ht 67.0 in | Wt 350.0 lb

## 2012-01-09 DIAGNOSIS — G4733 Obstructive sleep apnea (adult) (pediatric): Secondary | ICD-10-CM | POA: Insufficient documentation

## 2012-01-12 DIAGNOSIS — G4733 Obstructive sleep apnea (adult) (pediatric): Secondary | ICD-10-CM

## 2012-01-13 NOTE — Procedures (Signed)
NAME:  Chelsea Lester, Chelsea Lester              ACCOUNT NO.:  [id number]  MEDICAL RECORD NO.:  [id number]          PATIENT TYPE:  OUT  LOCATION:  SLEEP CENTER                 FACILITY:  [facility]  PHYSICIAN:  Clinton D. Maple Hudson, MD, FCCP, FACPDATE OF BIRTH:  03[birth year]08[birth year]  DATE OF STUDY:  01/09/2012                           NOCTURNAL POLYSOMNOGRAM  REFERRING PHYSICIAN:  KWADWO GYARTENG-DAKWA  REFERRING PHYSICIAN:  Tonye Royalty, MD  INDICATION FOR STUDY:  Hypersomnia with sleep apnea.  EPWORTH SLEEPINESS SCORE:  19/24, BMI 54.8, weight 350 pounds, height 67 inches, neck 16 inches.  HOME MEDICATIONS:  Charted and reviewed.  SLEEP ARCHITECTURE:  Split study protocol.  During the diagnostic phase, total sleep time 148.5 minutes with sleep efficiency 90.8%.  Stage I was 3%, stage II was 48.8%, stage III 1.3%, REM 46.8% of total sleep time. Sleep latency 0.  REM latency 78 minutes.  Awake after sleep onset 9 minutes.  Arousal index 25.5.  Bedtime medication:  Simvastatin, alprazolam, Travatan, baclofen, Voltaren.  RESPIRATORY DATA:  Split study protocol.  Apnea/hypopnea index (AHI) 17.8 per hour.  A total of 44 events was scored including 29 obstructive apneas, 2 central apneas, 3 mixed apneas, 10 hypopneas.  Events were more common while supine.  REM AHI 31.1 per hour.  CPAP was titrated to 11 CWP, AHI 0 per hour.  She wore a small ResMed Mirage Quattro fullface mask with heated humidifier and a C-Flex setting of 3.  OXYGEN DATA:  Moderately loud snoring before CPAP with oxygen desaturation to a nadir of 76% on room air.  With CPAP titration, snoring was prevented and mean oxygen saturation held 95.7% on room air.  CARDIAC DATA:  Sinus rhythm with occasional PVC.  MOVEMENT/PARASOMNIA:  No significant movement disturbance.  Bathroom x1.  IMPRESSION/RECOMMENDATION: 1. Moderate obstructive sleep apnea/hypopnea syndrome, AHI 17.8 per     hour with mostly supine events.  Moderately loud snoring  with     oxygen desaturation to a nadir of 76% on room air. 2. Successful CPAP titration to 11 CWP, AHI 0 per hour.  She wore a     small ResMed Mirage Quattro fullface mask with heated humidifier     and a C-Flex setting of 3.     Clinton D. Maple Hudson, MD, Tonny Bollman, FACP Diplomate, American Board of Sleep Medicine    CDY/MEDQ  D:  01/12/2012 11:27:22  T:  01/13/2012 01:24:08  Job:  [id number]

## 2012-04-24 ENCOUNTER — Emergency Department (HOSPITAL_COMMUNITY)
Admission: EM | Admit: 2012-04-24 | Discharge: 2012-04-24 | Disposition: A | Discharge status: Home / Self Care | Payer: Medicaid Other | Attending: Emergency Medicine | Admitting: Emergency Medicine

## 2012-04-24 ENCOUNTER — Encounter (HOSPITAL_COMMUNITY)
Admission: EM | Disposition: A | Discharge status: Home / Self Care | Payer: Self-pay | Source: Home / Self Care | Attending: Emergency Medicine

## 2012-04-24 ENCOUNTER — Emergency Department (HOSPITAL_COMMUNITY): Payer: Medicaid Other

## 2012-04-24 ENCOUNTER — Encounter (HOSPITAL_COMMUNITY): Payer: Self-pay

## 2012-04-24 DIAGNOSIS — R0602 Shortness of breath: Secondary | ICD-10-CM | POA: Insufficient documentation

## 2012-04-24 DIAGNOSIS — J189 Pneumonia, unspecified organism: Secondary | ICD-10-CM

## 2012-04-24 DIAGNOSIS — J4489 Other specified chronic obstructive pulmonary disease: Secondary | ICD-10-CM | POA: Insufficient documentation

## 2012-04-24 DIAGNOSIS — E119 Type 2 diabetes mellitus without complications: Secondary | ICD-10-CM | POA: Insufficient documentation

## 2012-04-24 DIAGNOSIS — J449 Chronic obstructive pulmonary disease, unspecified: Secondary | ICD-10-CM | POA: Insufficient documentation

## 2012-04-24 DIAGNOSIS — I10 Essential (primary) hypertension: Secondary | ICD-10-CM | POA: Insufficient documentation

## 2012-04-24 DIAGNOSIS — R079 Chest pain, unspecified: Secondary | ICD-10-CM | POA: Insufficient documentation

## 2012-04-24 DIAGNOSIS — Z794 Long term (current) use of insulin: Secondary | ICD-10-CM | POA: Insufficient documentation

## 2012-04-24 DIAGNOSIS — I2510 Atherosclerotic heart disease of native coronary artery without angina pectoris: Secondary | ICD-10-CM | POA: Insufficient documentation

## 2012-04-24 DIAGNOSIS — R059 Cough, unspecified: Secondary | ICD-10-CM | POA: Insufficient documentation

## 2012-04-24 DIAGNOSIS — R05 Cough: Secondary | ICD-10-CM

## 2012-04-24 LAB — CBC
HCT: 35.9 % — ABNORMAL LOW (ref 36.0–46.0)
Hemoglobin: 11.8 g/dL — ABNORMAL LOW (ref 12.0–15.0)
MCH: 23.8 pg — ABNORMAL LOW (ref 26.0–34.0)
MCHC: 32.9 g/dL (ref 30.0–36.0)
MCV: 72.4 fL — ABNORMAL LOW (ref 78.0–100.0)
Platelets: 234 K/uL (ref 150–400)
RBC: 4.96 MIL/uL (ref 3.87–5.11)
RDW: 18.6 % — ABNORMAL HIGH (ref 11.5–15.5)
WBC: 10.8 10*3/uL — ABNORMAL HIGH (ref 4.0–10.5)

## 2012-04-24 LAB — DIFFERENTIAL
Basophils Absolute: 0 K/uL (ref 0.0–0.1)
Basophils Relative: 0 % (ref 0–1)
Eosinophils Absolute: 0.1 K/uL (ref 0.0–0.7)
Eosinophils Relative: 1 % (ref 0–5)
Lymphocytes Relative: 15 % (ref 12–46)
Lymphs Abs: 1.7 K/uL (ref 0.7–4.0)
Monocytes Absolute: 0.4 10*3/uL (ref 0.1–1.0)
Monocytes Relative: 4 % (ref 3–12)
Neutro Abs: 8.5 10*3/uL — ABNORMAL HIGH (ref 1.7–7.7)
Neutrophils Relative %: 79 % — ABNORMAL HIGH (ref 43–77)

## 2012-04-24 LAB — BASIC METABOLIC PANEL
BUN: 6 mg/dL (ref 6–23)
CO2: 26 mEq/L (ref 19–32)
Chloride: 96 mEq/L (ref 96–112)
Creatinine, Ser: 0.6 mg/dL (ref 0.50–1.10)
GFR calc Af Amer: >90 mL/min (ref >90)
Glucose, Bld: 213 mg/dL — ABNORMAL HIGH (ref 70–99)
Potassium: 4.1 mEq/L (ref 3.5–5.1)

## 2012-04-24 LAB — BASIC METABOLIC PANEL WITH GFR
Calcium: 9.2 mg/dL (ref 8.4–10.5)
GFR calc non Af Amer: >90 mL/min (ref >90)
Sodium: 136 meq/L (ref 135–145)

## 2012-04-24 LAB — TROPONIN I: Troponin I: <0.3 ng/mL (ref <0.30)

## 2012-04-24 SURGERY — LEFT HEART CATHETERIZATION WITH CORONARY ANGIOGRAM
Anesthesia: LOCAL

## 2012-04-24 MED ORDER — ALBUTEROL SULFATE HFA 108 (90 BASE) MCG/ACT IN AERS
2.0000 | INHALATION_SPRAY | Freq: Once | RESPIRATORY_TRACT | Status: AC
  Administered 2012-04-24: 2 via RESPIRATORY_TRACT
  Filled 2012-04-24: qty 6.7

## 2012-04-24 MED ORDER — IOHEXOL 350 MG/ML SOLN
100.0000 mL | Freq: Once | INTRAVENOUS | Status: AC | PRN
  Administered 2012-04-24: 85 mL via INTRAVENOUS

## 2012-04-24 MED ORDER — NITROGLYCERIN 0.2 MG/ML ON CALL CATH LAB
INTRAVENOUS | Status: AC
  Filled 2012-04-24: qty 1

## 2012-04-24 MED ORDER — HYDROCODONE-ACETAMINOPHEN 5-325 MG PO TABS
2.0000 | ORAL_TABLET | Freq: Once | ORAL | Status: AC
  Administered 2012-04-24: 2 via ORAL
  Filled 2012-04-24: qty 2

## 2012-04-24 MED ORDER — HEPARIN (PORCINE) IN NACL 2-0.9 UNIT/ML-% IJ SOLN
INTRAMUSCULAR | Status: AC
  Filled 2012-04-24: qty 2000

## 2012-04-24 MED ORDER — AZITHROMYCIN 250 MG PO TABS
500.0000 mg | ORAL_TABLET | Freq: Once | ORAL | Status: AC
  Administered 2012-04-24: 500 mg via ORAL
  Filled 2012-04-24: qty 2

## 2012-04-24 MED ORDER — LIDOCAINE HCL (PF) 1 % IJ SOLN
INTRAMUSCULAR | Status: AC
  Filled 2012-04-24: qty 30

## 2012-04-24 MED ORDER — AZITHROMYCIN 250 MG PO TABS: 250.0000 mg | ORAL_TABLET | Freq: Every day | ORAL | Status: AC

## 2012-04-24 NOTE — Discharge Instructions (Signed)
Follow up with your providers as dicussed in the ED today and as written above.  See your doctor immediately--or return to the ED--with any new or troubling symptoms including fevers, weakness, new chest pain, shortness or breath, numbness, or any other concerning symptom.  Cough, Adult    A cough is a reflex that helps clear your throat and airways. It can help heal the body or may be a reaction to an irritated airway. A cough may only last 2 or 3 weeks (acute) or may last more than 8 weeks (chronic).  CAUSES Acute cough:  Viral or bacterial infections.  Chronic cough:  Infections.   Allergies.   Asthma.   Post-nasal drip.   Smoking.   Heartburn or acid reflux.   Some medicines.   Chronic lung problems (COPD).   Cancer.  SYMPTOMS   Cough.   Fever.   Chest pain.   Increased breathing rate.   High-pitched whistling sound when breathing (wheezing).   Colored mucus that you cough up (sputum).  TREATMENT   A bacterial cough may be treated with antibiotic medicine.   A viral cough must run its course and will not respond to antibiotics.   Your caregiver may recommend other treatments if you have a chronic cough.  HOME CARE INSTRUCTIONS   Only take over-the-counter or prescription medicines for pain, discomfort, or fever as directed by your caregiver. Use cough suppressants only as directed by your caregiver.   Use a cold steam vaporizer or humidifier in your bedroom or home to help loosen secretions.   Sleep in a semi-upright position if your cough is worse at night.   Rest as needed.   Stop smoking if you smoke.  SEEK IMMEDIATE MEDICAL CARE IF:   You have pus in your sputum.   Your cough starts to worsen.   You cannot control your cough with suppressants and are losing sleep.   You begin coughing up blood.   You have difficulty breathing.   You develop pain which is getting worse or is uncontrolled with medicine.   You have a fever.  MAKE SURE YOU:    Understand these instructions.   Will watch your condition.   Will get help right away if you are not doing well or get worse.  Document Released: 04/20/2011 Document Revised: 10/11/2011 Document Reviewed: 04/20/2011 [facility] Patient Information 2012 [place], [place].Pneumonia, Adult Pneumonia is an infection of the lungs.  CAUSES Pneumonia may be caused by bacteria or a virus. Usually, these infections are caused by breathing infectious particles into the lungs (respiratory tract). SYMPTOMS   Cough.   Fever.   Chest pain.   Increased rate of breathing.   Wheezing.   Mucus production.  DIAGNOSIS  If you have the common symptoms of pneumonia, your caregiver will typically confirm the diagnosis with a chest X-ray. The X-ray will show an abnormality in the lung (pulmonary infiltrate) if you have pneumonia. Other tests of your blood, urine, or sputum may be done to find the specific cause of your pneumonia. Your caregiver may also do tests (blood gases or pulse oximetry) to see how well your lungs are working. TREATMENT  Some forms of pneumonia may be spread to other people when you cough or sneeze. You may be asked to wear a mask before and during your exam. Pneumonia that is caused by bacteria is treated with antibiotic medicine. Pneumonia that is caused by the influenza virus may be treated with an antiviral medicine. Most other viral infections must  run their course. These infections will not respond to antibiotics.  PREVENTION A pneumococcal shot (vaccine) is available to prevent a common bacterial cause of pneumonia. This is usually suggested for:  People over 68 years old.   Patients on chemotherapy.   People with chronic lung problems, such as bronchitis or emphysema.   People with immune system problems.  If you are over 65 or have a high risk condition, you may receive the pneumococcal vaccine if you have not received it before. In some countries, a routine influenza  vaccine is also recommended. This vaccine can help prevent some cases of pneumonia.You may be offered the influenza vaccine as part of your care. If you smoke, it is time to quit. You may receive instructions on how to stop smoking. Your caregiver can provide medicines and counseling to help you quit. HOME CARE INSTRUCTIONS   Cough suppressants may be used if you are losing too much rest. However, coughing protects you by clearing your lungs. You should avoid using cough suppressants if you can.   Your caregiver may have prescribed medicine if he or she thinks your pneumonia is caused by a bacteria or influenza. Finish your medicine even if you start to feel better.   Your caregiver may also prescribe an expectorant. This loosens the mucus to be coughed up.   Only take over-the-counter or prescription medicines for pain, discomfort, or fever as directed by your caregiver.   Do not smoke. Smoking is a common cause of bronchitis and can contribute to pneumonia. If you are a smoker and continue to smoke, your cough may last several weeks after your pneumonia has cleared.   A cold steam vaporizer or humidifier in your room or home may help loosen mucus.   Coughing is often worse at night. Sleeping in a semi-upright position in a recliner or using a couple pillows under your head will help with this.   Get rest as you feel it is needed. Your body will usually let you know when you need to rest.  SEEK IMMEDIATE MEDICAL CARE IF:   Your illness becomes worse. This is especially true if you are elderly or weakened from any other disease.   You cannot control your cough with suppressants and are losing sleep.   You begin coughing up blood.   You develop pain which is getting worse or is uncontrolled with medicines.   You have a fever.   Any of the symptoms which initially brought you in for treatment are getting worse rather than better.   You develop shortness of breath or chest pain.  MAKE  SURE YOU:   Understand these instructions.   Will watch your condition.   Will get help right away if you are not doing well or get worse.  Document Released: 10/22/2005 Document Revised: 10/11/2011 Document Reviewed: 01/11/2011 [facility] Patient Information 2012 [place], [place].

## 2012-04-24 NOTE — Progress Notes (Signed)
*  Preliminary Results* Bilateral lower extremity venous duplex completed. Technically limited study due to patient body habitus and depth of vessels, unable to visualize all segments of the lower extremity veins bilaterally. The proximal and distal right femoral veins, right posterior tibial veins, and the left distal femoral vein are non compressible. The popliteal veins are partially compressible bilaterally. Incompressibility of vessels may be due to depth of vessels, however deep vein thrombosis cannot be excluded.  04/24/2012 11:54 AM  Elpidio Galea, RDMS,RDCS

## 2012-04-24 NOTE — ED Provider Notes (Signed)
History     CSN: [id number]  Arrival date & time 04/24/12  0746   First MD Initiated Contact with Patient 04/24/12 [phone]      Chief Complaint  Patient presents with  . Cough  . Shortness of Breath  . Chest Pain    (Consider location/radiation/quality/duration/timing/severity/associated sxs/prior treatment) HPI  56 year old female past medical history of COPD, obesity, history of DVT, and many other medical problems presenting today with a four-day history of relatively acute onset shortness of breath, cough, subjective fever, and a pleuritic-type chest pain. Pain is 5/10 worse with deep inspiration central chest.  Denies any production to her cough. She also has left lower extremity pain. Her right lower extremity is larger than her left. This is normal for her. However she has had a DVT. She denies any jaw pain arm pain. She denies any paroxysmal nocturnal dyspnea. On arrival the patient is tachycardic, tachypneic, hypertensive, but otherwise stable vital signs.  Past Medical History  Diagnosis Date  . GERD (gastroesophageal reflux disease)   . Colon polyp   . Asthma   . CAD (coronary artery disease)   . Diabetes mellitus   . Hypertension   . Osteoarthritis   . Microcytosis   . Sleep disorder   . Obesity   . Depression 11/07    Hospitalization required  . Cataract   . Diverticulosis   . Esophageal stricture   . Hemorrhoids   . IBS (irritable bowel syndrome)     Past Surgical History  Procedure Date  . Abdominal hysterectomy   . Eye surgery   . Upper gastrointestinal endoscopy   . Colonoscopy   . Abdominal hysterectomy     Family History  Problem Relation Age of Onset  . Rectal cancer Sister   . Lung cancer Father   . Breast cancer Sister   . Breast cancer Mother   . Colon cancer Other     Aunt, Uncle  . Liver cancer Brother     Nephew  . Esophageal cancer Father   . Diabetes Sister   . Diabetes Brother   . Diabetes Other     son & daughter  . Heart  disease Sister   . Heart disease Brother   . Kidney disease Sister     History  Substance Use Topics  . Smoking status: Never Smoker   . Smokeless tobacco: Never Used  . Alcohol Use: No    OB History    Grav Para Term Preterm Abortions TAB SAB Ect Mult Living                  Review of Systems Constitutional: POS for subjective fever and chills.  HENT: Negative for ear pain, sore throat and trouble swallowing.   Eyes: Negative for pain and visual disturbance.  Respiratory: POS for cough and shortness of breath.   Cardiovascular: Pleuritic chest pain and leg swelling.  Gastrointestinal: Negative for nausea, vomiting, abdominal pain and diarrhea.  Genitourinary: Negative for dysuria, urgency and frequency.  Musculoskeletal: Negative for back pain and joint swelling.  Skin: Negative for rash and wound.  Neurological: Negative for dizziness, syncope, speech difficulty, weakness and numbness.   Allergies  Percocet  Home Medications   Current Outpatient Rx  Name Route Sig Dispense Refill  . ALBUTEROL 90 MCG/ACT IN AERS Inhalation Inhale 2 puffs into the lungs every 6 (six) hours as needed. Wheezing or shortness of breath    . ASPIRIN 81 MG PO TABS Oral Take 81 mg by  mouth daily.      Marland Kitchen DICYCLOMINE HCL 10 MG PO CAPS  Take 1 tab 3 times a day before meals. 90 capsule 1  . ERGOCALCIFEROL 50000 UNITS PO CAPS Oral Take 50,000 Units by mouth once a week. Usually takes on saturday    . FAMOTIDINE 40 MG PO TABS  TAKE 1 TABLET EACH DAY 30 tablet 12  . FENOFIBRATE 145 MG PO TABS Oral Take 145 mg by mouth daily.      Marland Kitchen FERROUS SULFATE 325 (65 FE) MG PO TABS Oral Take 325 mg by mouth daily with breakfast.      . FLUTICASONE-SALMETEROL 115-21 MCG/ACT IN AERO Inhalation Inhale 2 puffs into the lungs as directed.      Marland Kitchen HYDROCODONE-ACETAMINOPHEN 5-500 MG PO TABS Oral Take 1 tablet by mouth every 6 (six) hours as needed.      Marland Kitchen HYDROCORTISONE ACETATE 25 MG RE SUPP  Use 1 suppository at  bedtime for 7 days. 7 suppository 1  . HYOSCYAMINE SULFATE ER 0.375 MG PO TB12 Oral Take 1 tablet (0.375 mg total) by mouth every 12 (twelve) hours as needed. 60 tablet 1  . INSULIN ISOPHANE & REGULAR (70-30) 100 UNIT/ML [place] SUSP Subcutaneous Inject 65 Units into the skin 2 (two) times daily with a meal.     . IPRATROPIUM BROMIDE 0.02 % IN SOLN Nebulization Take 500 mcg by nebulization 3 (three) times daily as needed. wheezing    . LORATADINE 10 MG PO TABS Oral Take 10 mg by mouth daily.      Marland Kitchen METFORMIN HCL 500 MG PO TABS Oral Take 500 mg by mouth.    . METOPROLOL TARTRATE 50 MG PO TABS Oral Take 50 mg by mouth 2 (two) times daily.      Marland Kitchen RABEPRAZOLE SODIUM 20 MG PO TBEC  Take 30 min before dinner. Lot # [id number] Exp date: 11/2012 Samples given. 30 tablet 2  . SIMVASTATIN 40 MG PO TABS Oral Take 40 mg by mouth at bedtime.      . TOBRAMYCIN SULFATE 0.3 % OP OINT Both Eyes Place 1 application into both eyes 4 (four) times daily.      . TRAVOPROST 0.004 % OP SOLN Both Eyes Place 1 drop into both eyes at bedtime.      . AZITHROMYCIN 250 MG PO TABS Oral Take 1 tablet (250 mg total) by mouth daily. For four days. 4 tablet 0  . HYOSCYAMINE SULFATE 0.125 MG SL SUBL Sublingual Place 2 tablets (0.25 mg total) under the tongue every 4 (four) hours as needed for cramping. 30 tablet 0  . INSULIN SYRINGES (DISPOSABLE) U-100 1 ML MISC Does not apply by Does not apply route.      Marland Kitchen LANCETS MISC Does not apply by Does not apply route.      . NON FORMULARY  [employer] test strip( Glucose Blood)       BP 148/88  Pulse 108  Temp 99.4 F (37.4 C) (Oral)  Resp 21  Ht 5\' 7"  (1.702 m)  SpO2 99%  Physical Exam Consitutional: Pt in no acute distress.   Head: Normocephalic and atraumatic.  Eyes: Extraocular motion intact, no scleral icterus Neck: Supple without meningismus, mass, or overt JVD Respiratory: Effort normal and breath sounds wheezy. No respiratory distress. CV: Heart tachy rate and regular rhythm  (sinus), no obvious murmurs.  Pulses +2 and symmetric Abdomen: Soft, non-tender, non-distended. No rebound or guarding.  MSK: Right lower extremity considerably larger than left. 1+ edema. Extremities  are atraumatic without deformity, ROM intact Skin: Warm, dry, intact Neuro: Alert and oriented, no motor deficit noted.   Psychiatric: Mood and affect are normal  EKG:  Rate: 104 Rythym Sinus  Interval  136 ms. Axis: normal No gross conduction abnormalities appreciated.  No gross ST or T-wave abnormalities appreciated.  No change.   ED Course  Procedures (including critical care time)  Labs Reviewed  CBC - Abnormal; Notable for the following:    WBC 10.8 (*)     Hemoglobin 11.8 (*)     HCT 35.9 (*)     MCV 72.4 (*)     MCH 23.8 (*)     RDW 18.6 (*)     All other components within normal limits  DIFFERENTIAL - Abnormal; Notable for the following:    Neutrophils Relative 79 (*)     Neutro Abs 8.5 (*)     All other components within normal limits  BASIC METABOLIC PANEL - Abnormal; Notable for the following:    Glucose, Bld 213 (*)     All other components within normal limits  TROPONIN I   Dg Chest 2 View  04/24/2012  *RADIOLOGY REPORT*  Clinical Data: Cough, shortness of breath, chest pain.  CHEST - 2 VIEW  Comparison: 04/27/2011  Findings: Low lung volumes with accentuation heart size and bibasilar atelectasis.  No effusions.  No acute bony abnormality.  IMPRESSION: Low volumes, bibasilar atelectasis.  Original Report Authenticated By: Cyndie Chime, M.D.   Ct Angio Chest W/cm &/or Wo Cm  04/24/2012  *RADIOLOGY REPORT*  Clinical Data: Cough, shortness of breath.  CT ANGIOGRAPHY CHEST  Technique:  Multidetector CT imaging of the chest using the standard protocol during bolus administration of intravenous contrast. Multiplanar reconstructed images including MIPs were obtained and reviewed to evaluate the vascular anatomy.  Contrast: 85mL OMNIPAQUE IOHEXOL 350 MG/ML SOLN   Comparison: Chest x-ray earlier today.  Findings: Respiratory motion degrades image quality in the lung bases.  No visible filling defects in the pulmonary arteries to suggest pulmonary emboli. Nodular airspace disease in the posterior lower lobes bilaterally, left greater than right.  Cannot exclude early bronchopneumonia, particularly in the left base.  Ground- glass opacities are noted in the right upper lobe which could represent small airways disease/alveolitis or early bronchopneumonia.  No effusions.  The heart is upper limits normal in size.  Aorta is normal caliber. No mediastinal, hilar, or axillary adenopathy.  Small scattered mediastinal and hilar lymph nodes, none pathologically enlarged. Visualized thyroid and chest wall soft tissues unremarkable.  Imaging into the upper abdomen shows no acute findings.  Diffuse fatty infiltration of the liver   IMPRESSION: No evidence of pulmonary embolus.  Respiratory motion obscures image quality in the lung bases.  Nodular airspace disease in the lung bases, left greater than right.  Ground-glass opacities in the right upper lobe.  Cannot exclude early areas of pneumonia.  Original Report Authenticated By: Cyndie Chime, M.D.     1. Cough   2. Community acquired pneumonia       MDM   Patient presents cough and with likely asthma/COPD exacerbation (mild) with subacute onset of shortness of breath. Bronchitis versus pneumonia given her pain with coughing. She also has right greater than left leg swelling, left leg pain, pleuritic chest pain.  Given history DVT, her overall well score is greater than 6, she is not a candidate for a d-dimer testing.   CT scan chest, DVT study of the legs. Chest x-ray,  basic blood work, trop.   Chest x-ray unremarkable. DVT study not entirely diagnostic, however negative. CT scan demonstrates no PE, likely pneumonia left lower lobe. Will not treat for DVT/PE based on these findings, and notably the patient does have a  past history of asymmetric leg swelling.  The patient as mentioned is not hypoxic, and no longer tachycardic. Outpatient management with azithromycin. First dose given here. Patient to followup with primary care.  PT DC home stable.  Discussed with pt the clinical impression, treatment in the ED, and follow up plan.  We alslo discussed the indications for returning to the ED, which include shortness or breath, confusion, fever, new weakness or numbness, chest pain, or any other concerning symptom.  The pt understood the treatment and plan, is stable, and is able to leave the ED.          Larrie Kass, MD 04/24/12 1606

## 2012-04-24 NOTE — ED Notes (Signed)
Vascular lab called and will pick patient up for testing around 10AM

## 2012-04-24 NOTE — ED Notes (Signed)
Per EMS, pt is from home. States was up all night coughing with a non-productive cough.  EMS sts lungs clear initially but became SOB when trying to put a gown on. Recently was informed she had a spot on her left lung. Recent history of PNA a few months ago. 97-98 % RA, 163/92, 99.2 temp

## 2012-04-25 NOTE — ED Provider Notes (Signed)
I saw and evaluated the patient, reviewed the resident's note and I agree with the findings and plan.  I reviewed and agree with resident ECG interp.  Pt with wheezing, resp symptoms and CP.  Pt with chronic swelling of leg, h/o DVT, has pain for a long duration in opposite lower leg.  CT and U/S performed to r/o PE and/or DVT, neg.  Pt treated for mild CAPNA.  Not hypoxic, stable for outpt treatment and close follow up with PCP.    Gavin Pound. Oletta Lamas, MD 04/25/12 2056

## 2012-05-02 ENCOUNTER — Encounter: Payer: Self-pay | Admitting: Pulmonary Disease

## 2012-05-02 ENCOUNTER — Ambulatory Visit (INDEPENDENT_AMBULATORY_CARE_PROVIDER_SITE_OTHER): Payer: Medicaid Other | Admitting: Pulmonary Disease

## 2012-05-02 VITALS — BP 138/88 | HR 119 | Temp 98.3°F | Ht 67.0 in | Wt 321.0 lb

## 2012-05-02 DIAGNOSIS — R0600 Dyspnea, unspecified: Secondary | ICD-10-CM

## 2012-05-02 DIAGNOSIS — R05 Cough: Secondary | ICD-10-CM | POA: Insufficient documentation

## 2012-05-02 DIAGNOSIS — R918 Other nonspecific abnormal finding of lung field: Secondary | ICD-10-CM

## 2012-05-02 DIAGNOSIS — R0989 Other specified symptoms and signs involving the circulatory and respiratory systems: Secondary | ICD-10-CM

## 2012-05-02 NOTE — Assessment & Plan Note (Signed)
The patient has mild infiltrates on CT chest that may be secondary to reflux with chronic aspiration.  They are unresponsive to antibiotics, and possibly could be related to an inflammatory process such as sarcoidosis?  She does not have lymphadenopathy. They are not overly impressive, and suspect are not contributing to her symptom of doe.

## 2012-05-02 NOTE — Assessment & Plan Note (Signed)
The pt has a significant dry hacking cough that I suspect is secondary to reflux.  She is having a sore throat, severe reflux symptoms, regurgitation, and dysphagia.  This can cause instability of the upper airway, and cause upper airway wheezing and dyspnea.  Will increase her reflux meds, but would highly recommend return to GI who she has seen in the past.

## 2012-05-02 NOTE — Assessment & Plan Note (Addendum)
The patient has progressive dyspnea on exertion over the last 6 months, that I suspect is multifactorial.  She is morbidly obese, has worsening musculoskeletal issues and deconditioning, has underlying heart disease with ongoing lower extremity edema, and has a history of asthma.  She does apparently have a h/o DVT, but no PE on her recent ct chest.   It is unclear to me whether she has a pulmonary issue or not, and I would like to take this opportunity to discontinue her maintenance medication and check pulmonary function studies.  If she does have air flow obstruction, she will need to be on another medication other than Advair in light of her upper airway instability (advair can cause increased cough in those with unstable upper airway).  The role of her pulmonary infiltrates on CT is unclear, since they are very mild in nature.  Again, I am wondering if these are due to occult chronic aspiration?

## 2012-05-02 NOTE — Progress Notes (Signed)
Subjective:    Patient ID: Chelsea Lester, female    DOB: 1956/09/29, 56 y.o.   MRN: [id number]  HPI The patient is a 56 year old female who I've been asked to see for dyspnea and possible asthma.  The patient states that she carries the diagnosis of asthma from over 30 years ago, and has been on inhalers since that time.  She is currently on Advair for maintenance, and states that she is very compliant.  She has noted increasing shortness of breath over the last 6 months, but it has been especially bad the last 3 months.  She describes a less than one block dyspnea on exertion, but admits that she has significant musculoskeletal issues that interfere with any ambulation.  She states that she can get winded just walking through her house.  The patient notes significant dry and hacking cough, and that sometimes her inhalers can worsen her cough.  She denies significant postnasal drip, but does have audible wheezing that is most consistent with upper airway pseudo-wheezing.  The patient has a long-standing history of reflux disease and stricture, and currently is having great difficulty swallowing.  She is having any sore throat with ongoing reflux symptoms despite medications, as well as regurgitation of stomach contents.  The patient also has a history of cardiac disease, and has been having lower extremity edema.  She also states that her weight is up 20 pounds over the last one year.  Complicating all of this, with the findings of subtle pulmonary infiltrates on chest CT in May of this year.  This included left lower lobe nodular densities, and a right upper lobe groundglass opacity.  She did not have any mediastinal lymphadenopathy.  She was treated with an antibiotic, and had a followup CT this month which showed no change.  She was treated with a Z-Pak, and just finished this.  She has had no chest congestion or purulent mucus.  It should be noted that her chest CT did not show any obvious pulmonary  embolus, but there was degradation in image quality due to body habitus and movement.   Review of Systems  Constitutional: Positive for unexpected weight change. Negative for fever.  HENT: Positive for ear pain, congestion, sore throat, rhinorrhea, sneezing, trouble swallowing, dental problem, postnasal drip and sinus pressure. Negative for nosebleeds.   Eyes: Positive for itching. Negative for redness.  Respiratory: Positive for cough, shortness of breath and wheezing. Negative for chest tightness.   Cardiovascular: Positive for chest pain, palpitations and leg swelling.  Gastrointestinal: Negative for nausea and vomiting.       Heartburn Indigestion   Genitourinary: Negative.  Negative for dysuria.  Musculoskeletal: Positive for joint swelling and arthralgias.  Skin: Positive for rash.  Neurological: Positive for headaches.  Hematological: Negative.  Does not bruise/bleed easily.  Psychiatric/Behavioral: Negative for dysphoric mood. The patient is nervous/anxious.        Objective:   Physical Exam Constitutional:  Morbidly obese female, no acute distress  HENT:  Nares patent without discharge  Oropharynx without exudate, palate and uvula are thick and elongated.   Eyes:  Perrla, eomi, no scleral icterus. +proptosis  Neck:  No JVD, no TMG  Cardiovascular:  Normal rate, regular rhythm, no rubs or gallops.  No murmurs        Intact distal pulses  Pulmonary :  Poor depth of inspiration with small lung volumes, no stridor or respiratory distress   No rales, rhonchi, or wheezing  Abdominal:  Soft, nondistended, bowel  sounds present.  No tenderness noted.   Musculoskeletal:  + lower extremity edema noted.  Lymph Nodes:  No cervical lymphadenopathy noted  Skin:  No cyanosis noted  Neurologic:  Alert, appropriate, moves all 4 extremities without obvious deficit.         Assessment & Plan:

## 2012-05-02 NOTE — Patient Instructions (Addendum)
Stop advair, stop ipratropium nebulizer treatments.  You can use albuterol inhaler for rescue if needed until I see you again. Change aciphex to one in am and pm before meal. Change pepcid to at bedtime. Will see you back on July 18 or 19, and will do breathing tests SAME DAY to review with you.

## 2012-05-22 ENCOUNTER — Ambulatory Visit: Payer: Medicaid Other | Admitting: Pulmonary Disease

## 2012-06-27 ENCOUNTER — Ambulatory Visit (INDEPENDENT_AMBULATORY_CARE_PROVIDER_SITE_OTHER): Payer: Medicaid Other | Admitting: Pulmonary Disease

## 2012-06-27 ENCOUNTER — Encounter: Payer: Self-pay | Admitting: Pulmonary Disease

## 2012-06-27 VITALS — BP 124/82 | HR 120 | Temp 98.2°F | Ht 67.0 in | Wt 318.0 lb

## 2012-06-27 DIAGNOSIS — R05 Cough: Secondary | ICD-10-CM

## 2012-06-27 DIAGNOSIS — R059 Cough, unspecified: Secondary | ICD-10-CM

## 2012-06-27 DIAGNOSIS — R0989 Other specified symptoms and signs involving the circulatory and respiratory systems: Secondary | ICD-10-CM

## 2012-06-27 DIAGNOSIS — R0609 Other forms of dyspnea: Secondary | ICD-10-CM

## 2012-06-27 DIAGNOSIS — R0600 Dyspnea, unspecified: Secondary | ICD-10-CM

## 2012-06-27 DIAGNOSIS — R918 Other nonspecific abnormal finding of lung field: Secondary | ICD-10-CM

## 2012-06-27 DIAGNOSIS — J45909 Unspecified asthma, uncomplicated: Secondary | ICD-10-CM

## 2012-06-27 LAB — PULMONARY FUNCTION TEST

## 2012-06-27 NOTE — Progress Notes (Signed)
PFT done today. 

## 2012-06-27 NOTE — Assessment & Plan Note (Signed)
The patient had no airflow obstruction on her current PFTs, despite being off Advair for weeks leading up to this study.  It is unclear if she really has asthma or not, and for now we have made the decision to not treat with inhaled steroids to see how things go.  However, she is to let me know if she has increased beta agonist use.

## 2012-06-27 NOTE — Assessment & Plan Note (Signed)
The patient's cough is improved with the more aggressive reflux regimen, but has not totally resolved.  I have asked her to continue with her current medications, and because of her significant symptoms would suggest GI followup.

## 2012-06-27 NOTE — Assessment & Plan Note (Signed)
The patient's pulmonary function studies showed no airflow obstruction, mild to moderate restriction, and to a mild to moderate decrease in diffusion capacity that does correct with alveolar volume adjustment.  It is unclear to me whether the patient has asthma or not, but I feel very confident that asthma is not responsible for her dyspnea on exertion.  Her past x-rays have not shown an interstitial process to give her restrictive physiology, and I suspect this is because of her centripetal obesity.  I have explained to the patient that her dyspnea is multifactorial, and related to her obesity, deconditioning, cardiac issues, but unclear if there is a pulmonary issue here.  She does have a history of very subtle and vague infiltrates on CT chest in June, and I would like to repeat this study to see if still present.

## 2012-06-27 NOTE — Patient Instructions (Addendum)
Stay on your current meds for reflux disease. Stay off your advair for now, but can use albuterol as needed.  Please call if you are needing albuterol more than 2-3 times a day, or if you feel your breathing is worsening. Will schedule for followup scan of your chest in 2 weeks.  Will discuss results with you, and decide if we need to do anything different. Work on weight loss. Will arrange followup and further plans once scan of chest is done.

## 2012-06-27 NOTE — Progress Notes (Signed)
  Subjective:    Patient ID: Chelsea Lester, female    DOB: 1956/02/04, 56 y.o.   MRN: [id number]  HPI Patient comes in today for followup of her dyspnea on exertion.  At the last visit, I discontinued her maintenance asthma medications, and asked her to have pulmonary function studies today.  Her PFTs showed no obstruction, mild restriction, and mild to moderate reduction in her diffusion capacity that corrects with alveolar volume adjustment.  I also increased the patient's acid reflux medicine, and she tells me that her cough has improved.  Despite being off Advair, she states her dyspnea has not worsened.  She is using her rescue medication 2-3 times a day, but was doing this even before stopping the Advair.   Review of Systems  Constitutional: Negative for fever and unexpected weight change.  HENT: Positive for postnasal drip. Negative for ear pain, nosebleeds, congestion, sore throat, rhinorrhea, sneezing, trouble swallowing, dental problem and sinus pressure.   Eyes: Negative for redness and itching.  Respiratory: Positive for cough and shortness of breath. Negative for chest tightness and wheezing.   Cardiovascular: Positive for leg swelling. Negative for palpitations.  Gastrointestinal: Negative for nausea and vomiting.  Genitourinary: Negative for dysuria.  Musculoskeletal: Negative for joint swelling.  Skin: Negative for rash.  Neurological: Negative for headaches.  Hematological: Does not bruise/bleed easily.  Psychiatric/Behavioral: Negative for dysphoric mood. The patient is not nervous/anxious.   All other systems reviewed and are negative.       Objective:   Physical Exam Morbidly obese female in no acute distress Nose without purulence or discharge noted Chest with mild decrease in breath sounds, no wheezes or rhonchi Cardiac exam with regular rate and rhythm Lower extremities with mild edema, no cyanosis Alert and oriented, moves all 4 extremities.         Assessment & Plan:

## 2012-07-11 ENCOUNTER — Ambulatory Visit (INDEPENDENT_AMBULATORY_CARE_PROVIDER_SITE_OTHER)
Admission: RE | Admit: 2012-07-11 | Discharge: 2012-07-11 | Disposition: A | Discharge status: Home / Self Care | Payer: Medicaid Other | Source: Ambulatory Visit | Attending: Pulmonary Disease | Admitting: Pulmonary Disease

## 2012-07-11 DIAGNOSIS — R918 Other nonspecific abnormal finding of lung field: Secondary | ICD-10-CM

## 2012-07-18 ENCOUNTER — Encounter: Payer: Self-pay | Admitting: Pulmonary Disease

## 2012-10-13 ENCOUNTER — Encounter: Payer: Self-pay | Admitting: Pulmonary Disease

## 2012-10-13 ENCOUNTER — Ambulatory Visit (INDEPENDENT_AMBULATORY_CARE_PROVIDER_SITE_OTHER): Payer: Medicaid Other | Admitting: Pulmonary Disease

## 2012-10-13 VITALS — BP 140/98 | HR 81 | Temp 97.8°F | Ht 67.0 in | Wt 330.2 lb

## 2012-10-13 DIAGNOSIS — R0609 Other forms of dyspnea: Secondary | ICD-10-CM

## 2012-10-13 DIAGNOSIS — J45909 Unspecified asthma, uncomplicated: Secondary | ICD-10-CM

## 2012-10-13 DIAGNOSIS — R918 Other nonspecific abnormal finding of lung field: Secondary | ICD-10-CM

## 2012-10-13 DIAGNOSIS — R0600 Dyspnea, unspecified: Secondary | ICD-10-CM

## 2012-10-13 NOTE — Assessment & Plan Note (Signed)
The patient has no significant airflow obstruction noted on PFTs off inhaled medications.  I have told her if this does not 100% exclude asthma, but shows that asthma itself has nothing to do with her current shortness of breath.  I have given her the option of staying on her maintenance medication if she feels strongly that she has asthma, but I have recommended stopping the medication to prove to herself that she does or does not have asthma.

## 2012-10-13 NOTE — Patient Instructions (Addendum)
Work on weight loss and an exercise program.  This is the key to your breathing getting better. Would consider coming off your advair to prove whether you really have asthma or not.  However, I know that asthma is NOT what is causing your shortness of breath.  followup with me as needed.

## 2012-10-13 NOTE — Progress Notes (Signed)
  Subjective:    Patient ID: Chelsea Lester, female    DOB: 1956-10-30, 56 y.o.   MRN: [id number]  HPI Patient comes in today for followup of her dyspnea on exertion.  This is felt secondary to her restriction noted on PFTs secondary to her morbid obesity.  She had no airflow obstruction on her PFTs.  It is unclear whether she has asthma or not, but there is no question that asthma is not responsible for her symptoms.  I have asked her to stay off the Advair, but she has restarted this.  I will leave it up to her whether to continue on this or not.  The patient did have a history of pulmonary infiltrates, but these had resolved on her recent CT chest.   Review of Systems  Constitutional: Positive for unexpected weight change ( gain). Negative for fever.  HENT: Positive for congestion, sore throat, rhinorrhea, sneezing, postnasal drip and sinus pressure. Negative for ear pain, nosebleeds, trouble swallowing and dental problem.   Eyes: Positive for redness and itching.  Respiratory: Positive for cough, chest tightness, shortness of breath and wheezing.   Cardiovascular: Positive for palpitations and leg swelling.  Gastrointestinal: Positive for nausea. Negative for vomiting.  Genitourinary: Positive for decreased urine volume. Negative for dysuria.  Musculoskeletal: Positive for joint swelling and arthralgias.  Skin: Negative for rash.  Neurological: Positive for headaches.  Hematological: Does not bruise/bleed easily.  Psychiatric/Behavioral: Positive for dysphoric mood. The patient is not nervous/anxious.        Objective:   Physical Exam Massively obese female in no acute distress Nose without purulence or discharge noted Neck without thyromegaly or lymphadenopathy Chest with minimal basilar crackles, no wheezes or rhonchi Cardiac exam is regular rate and rhythm Lower extremities with edema noted, no cyanosis Alert and oriented, moves all 4 extremities.       Assessment & Plan:

## 2012-10-13 NOTE — Assessment & Plan Note (Signed)
The patient has significant dyspnea on exertion but I suspect it is primarily related to her morbid obesity with restrictive physiology on PFTs.  I cannot exclude an underlying cardiac issue, or the possibility of pulmonary hypertension related to her obesity.  I have asked her to work aggressively on weight loss, and we'll leave it to her primary physician to consider a cardiac workup if she continues to have symptoms.

## 2012-12-04 IMAGING — CT CT ABD-PELV W/ CM
2 of 5 series · 17 of 46 positions shown, 19 images · IV contrast (APPLIED)
Comparison: CT abdomen 09/17/2006.

CLINICAL DATA: Epigastric and right lower quadrant abdominal pain.

CT ABDOMEN AND PELVIS WITH CONTRAST
TECHNIQUE: Multidetector CT imaging of the abdomen and pelvis was
performed following the standard protocol during bolus
administration of intravenous contrast.
Contrast: 100 ml of Hmnipaque-P77

[Series 2: abd/pelv with 5.0 b31f st · axial · 0.98mm/px · z∈[-409,+26]mm · 14 of 97 slices shown, 16 images]
[im 5/97  soft-tissue]
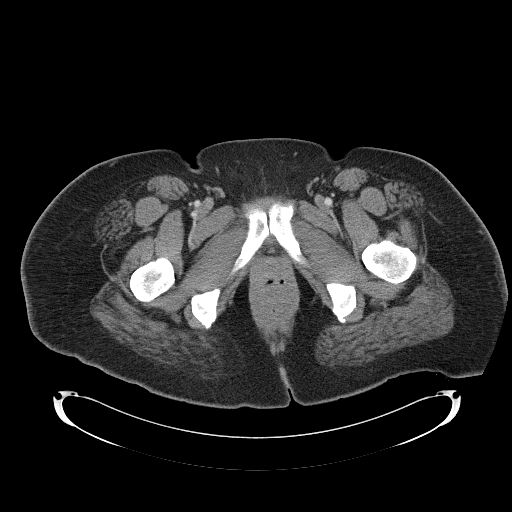
[im 5/97  bone]
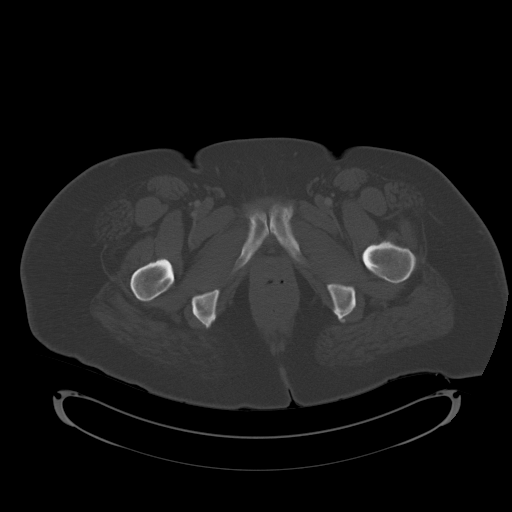
[im 15/97  soft-tissue]
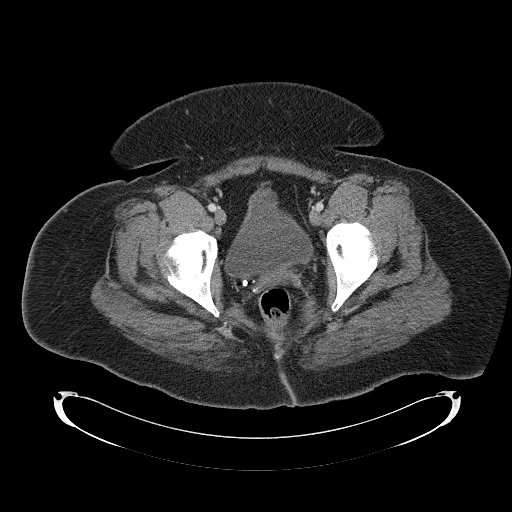
[im 20/97  soft-tissue]
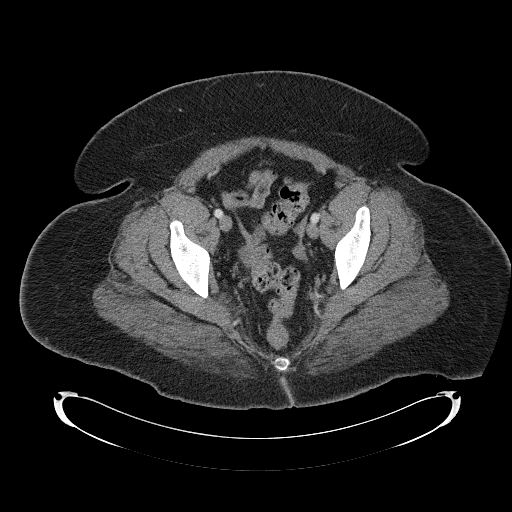
[im 25/97  soft-tissue]
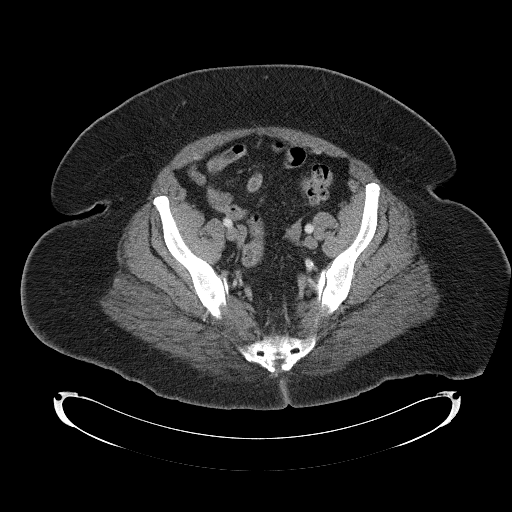
[im 34/97  soft-tissue]
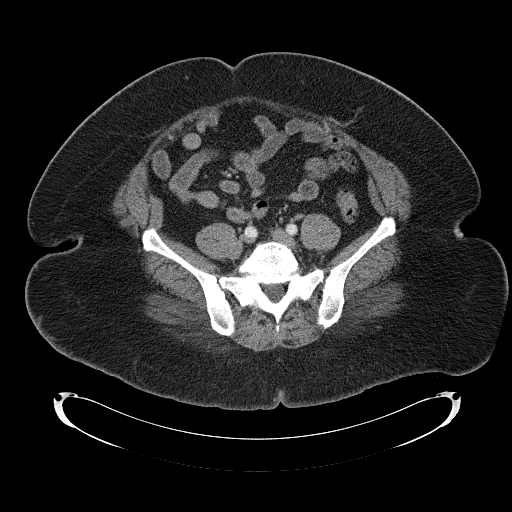
[im 39/97  soft-tissue]
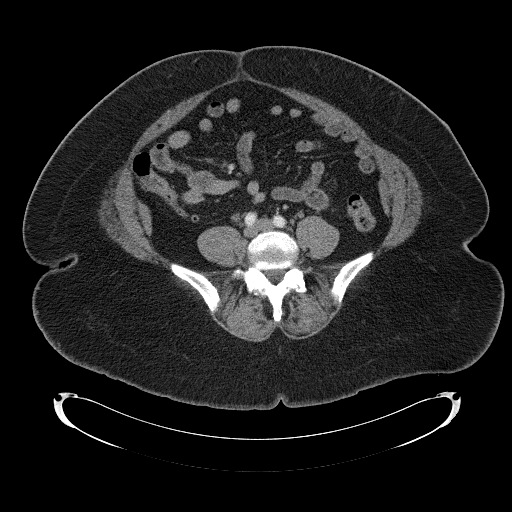
[im 44/97  soft-tissue]
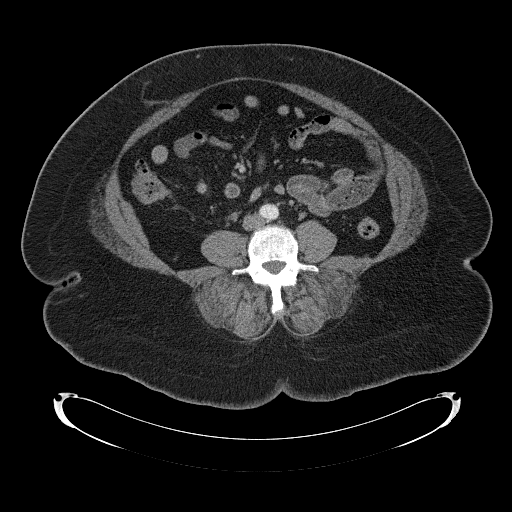
[im 53/97  soft-tissue]
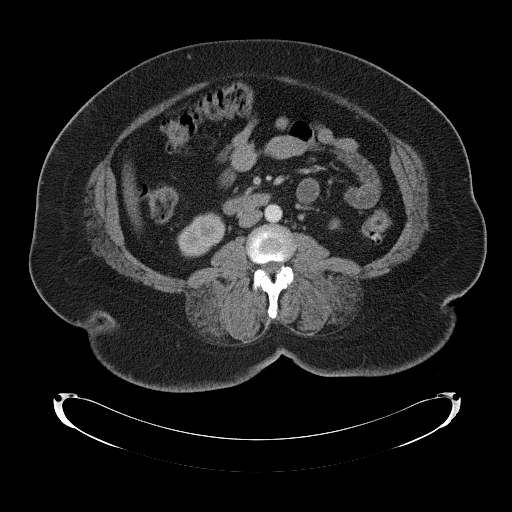
[im 58/97  soft-tissue]
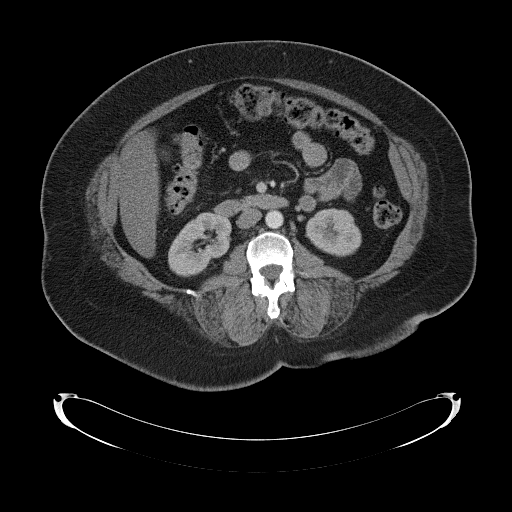
[im 58/97  bone]
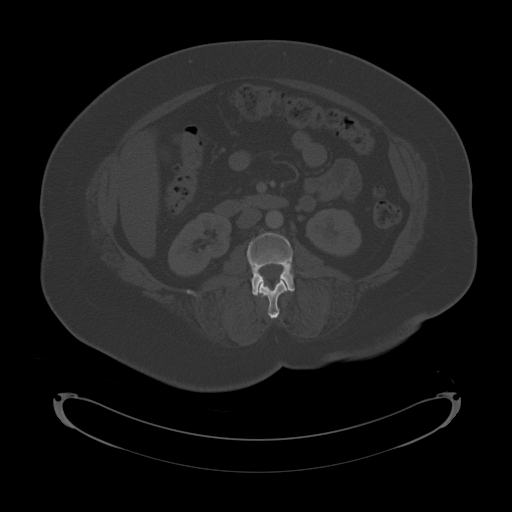
[im 63/97  soft-tissue]
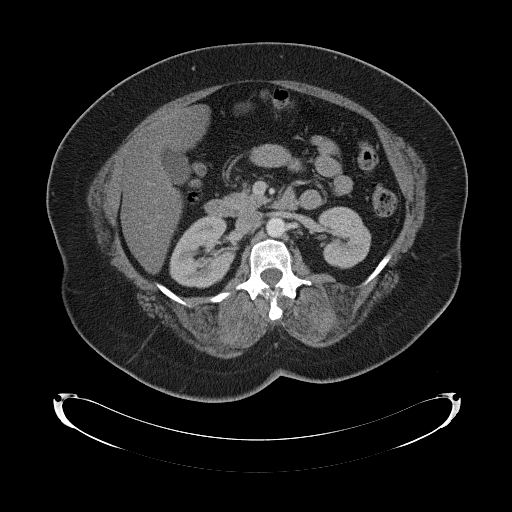
[im 73/97  soft-tissue]
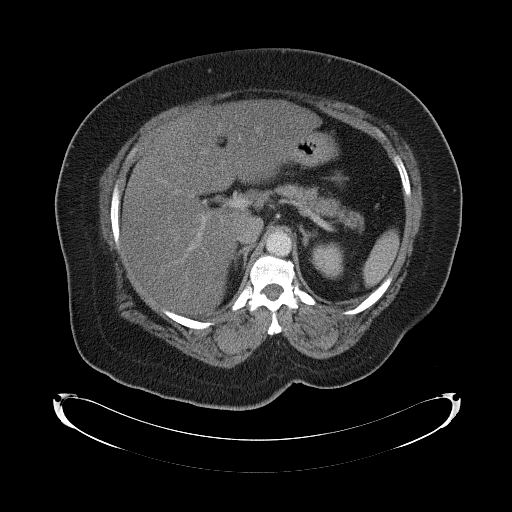
[im 77/97  soft-tissue]
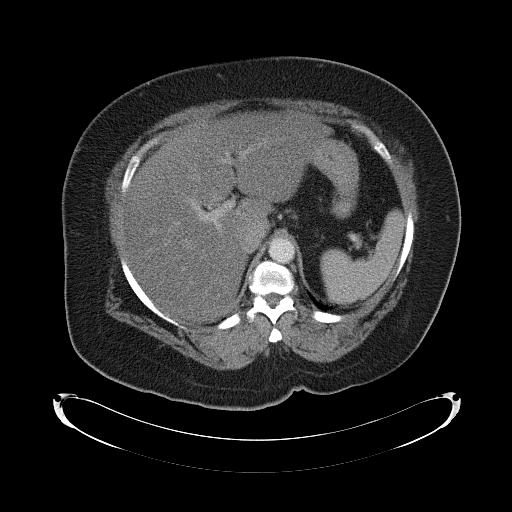
[im 82/97  soft-tissue]
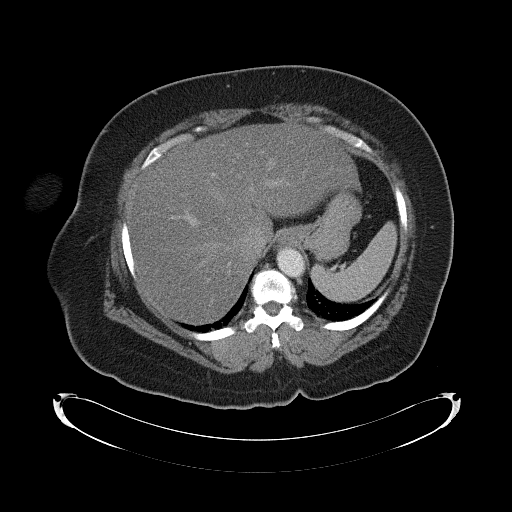
[im 92/97  soft-tissue]
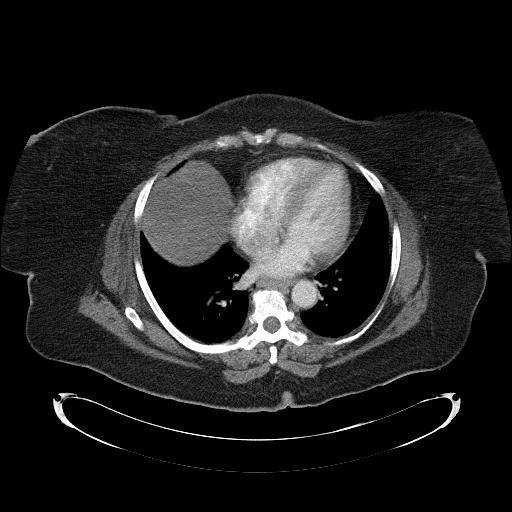

[Series 5: abd/pelv with 3.0 spo cor st · coronal · 0.95mm/px · 3 of 94 slices shown]
[im 32/94  soft-tissue]
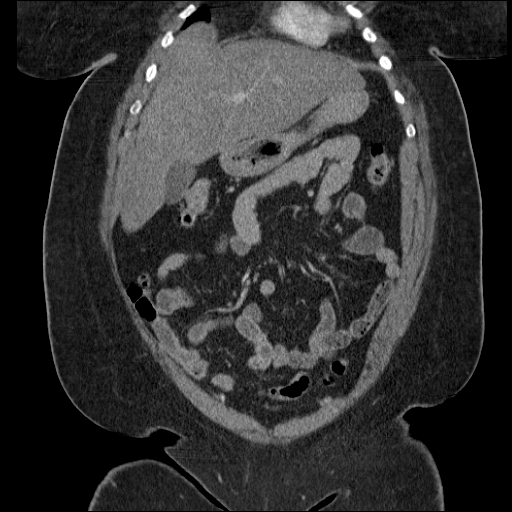
[im 42/94  soft-tissue]
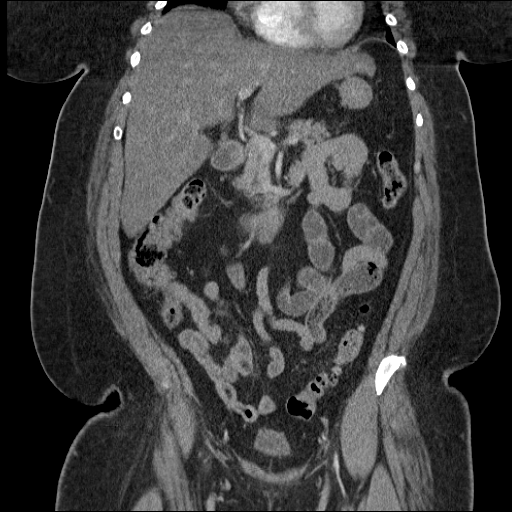
[im 52/94  soft-tissue]
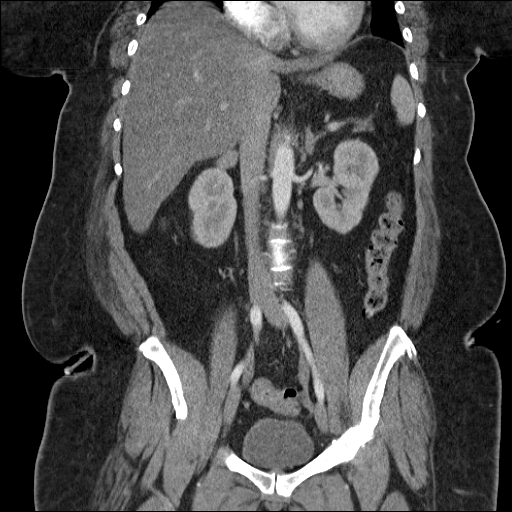

[17 of 46 positions shown; findings below may reference images not displayed]

FINDINGS: The lung bases demonstrate dependent atelectasis.  The
heart is borderline enlarged.  No pericardial effusion.  A small
hiatal hernia is noted.

There is diffuse fatty infiltration of the liver.  No focal hepatic
lesions or intrahepatic ductal dilatation.  The gallbladder is
normal.  No common bile duct dilatation.  The pancreas is
unremarkable.  The spleen is normal in size.  No focal lesions.
The adrenal glands and kidneys demonstrate no significant
abnormalities.

The stomach, duodenum, small bowel and colon demonstrate no
significant abnormalities.  There is moderate sigmoid
diverticulosis but no findings for acute diverticulitis.  The
appendix is normal.

The aorta is normal in caliber.  The major branch vessels are
normal.  No dissection.  No mesenteric or retroperitoneal masses or
adenopathy.

The uterus is surgically absent.  Both ovaries are still present
and appear normal.  The bladder is normal.  No pelvic mass,
adenopathy or free pelvic fluid collection.  No inguinal mass or
hernia.  The bony pelvis is intact.
IMPRESSION: Unremarkable CT abdomen/pelvis.  No acute abdominal/pelvic
findings, mass lesions or adenopathy.

## 2013-01-14 ENCOUNTER — Other Ambulatory Visit: Payer: Self-pay | Admitting: Internal Medicine

## 2013-01-14 DIAGNOSIS — Z1231 Encounter for screening mammogram for malignant neoplasm of breast: Secondary | ICD-10-CM

## 2013-02-06 ENCOUNTER — Ambulatory Visit
Admission: RE | Admit: 2013-02-06 | Discharge: 2013-02-06 | Disposition: A | Discharge status: Home / Self Care | Payer: Medicaid Other | Source: Ambulatory Visit | Attending: Internal Medicine | Admitting: Internal Medicine

## 2013-02-06 DIAGNOSIS — Z1231 Encounter for screening mammogram for malignant neoplasm of breast: Secondary | ICD-10-CM

## 2013-02-09 ENCOUNTER — Other Ambulatory Visit: Payer: Self-pay | Admitting: Internal Medicine

## 2013-02-09 DIAGNOSIS — R928 Other abnormal and inconclusive findings on diagnostic imaging of breast: Secondary | ICD-10-CM

## 2013-02-11 ENCOUNTER — Other Ambulatory Visit: Payer: Self-pay | Admitting: Cardiovascular Disease

## 2013-02-11 ENCOUNTER — Ambulatory Visit
Admission: RE | Admit: 2013-02-11 | Discharge: 2013-02-11 | Disposition: A | Discharge status: Home / Self Care | Payer: Medicaid Other | Source: Ambulatory Visit | Attending: Cardiovascular Disease | Admitting: Cardiovascular Disease

## 2013-02-11 DIAGNOSIS — R079 Chest pain, unspecified: Secondary | ICD-10-CM

## 2013-02-19 ENCOUNTER — Ambulatory Visit
Admission: RE | Admit: 2013-02-19 | Discharge: 2013-02-19 | Disposition: A | Discharge status: Home / Self Care | Payer: Medicaid Other | Source: Ambulatory Visit | Attending: Internal Medicine | Admitting: Internal Medicine

## 2013-02-19 DIAGNOSIS — R928 Other abnormal and inconclusive findings on diagnostic imaging of breast: Secondary | ICD-10-CM

## 2013-02-20 ENCOUNTER — Telehealth: Payer: Self-pay | Admitting: *Deleted

## 2013-02-20 NOTE — Telephone Encounter (Signed)
Left message for pt to return my call so I can schedule a genetic appt.  

## 2013-02-20 NOTE — Telephone Encounter (Signed)
Pt returned my call and I confirmed 03/23/13 genetic appt w/ pt.  Emailed Leigh at [facility] to make her aware.

## 2013-03-23 ENCOUNTER — Other Ambulatory Visit: Payer: Medicaid Other

## 2013-03-23 ENCOUNTER — Ambulatory Visit (HOSPITAL_BASED_OUTPATIENT_CLINIC_OR_DEPARTMENT_OTHER): Payer: Medicaid Other | Admitting: Genetic Counselor

## 2013-03-23 DIAGNOSIS — Z800 Family history of malignant neoplasm of digestive organs: Secondary | ICD-10-CM

## 2013-03-23 DIAGNOSIS — Z803 Family history of malignant neoplasm of breast: Secondary | ICD-10-CM

## 2013-03-23 NOTE — Progress Notes (Signed)
Dr. Concepcion Elk requested a cancer genetics consultation for Chelsea Lester, a 57 y.o. female, due to a family history of cancer.  Chelsea Lester presents to clinic today to discuss the possibility of a hereditary predisposition to cancer, genetic testing, and to further clarify her future cancer risks, as well as potential cancer risk for family members.   HISTORY OF PRESENT ILLNESS: Chelsea Lester has no personal history of cancer.   Past Medical History  Diagnosis Date   GERD (gastroesophageal reflux disease)    Colon polyp    Asthma    CAD (coronary artery disease)    Diabetes mellitus    Hypertension    Osteoarthritis    Microcytosis    Sleep disorder    Obesity    Depression 11/07    Hospitalization required   Cataract    Diverticulosis    Esophageal stricture    Hemorrhoids    IBS (irritable bowel syndrome)     Past Surgical History  Procedure Laterality Date   Abdominal hysterectomy     Eye surgery     Upper gastrointestinal endoscopy     Colonoscopy     Abdominal hysterectomy      HORMONAL RISK FACTORS: Menarche was at age 24 First live birth at age 45  OCP use: approximately 6 years Ovaries intact: no Hysterectomy: yes Menopausal status: postmenopausal HRT use: approximately 0 years Colonoscopy: yes; 1 benign polyp at age 43 UTD mammogram: yes Number of breast biopsies: 0 Excessive radiation exposure:  no  History   Social History   Marital Status: Married    Spouse Name: N/A    Number of Children: N/A   Years of Education: N/A   Occupational History   Disabled    Social History Main Topics   Smoking status: Never Smoker    Smokeless tobacco: Never Used   Alcohol Use: No   Drug Use: No   Sexually Active: Not on file   Other Topics Concern   Not on file   Social History Narrative   Patient gets refular exercise walks.     FAMILY HISTORY:  During the visit, a 4-generation pedigree was obtained. Significant diagnoses  include the following:  Family History  Problem Relation Age of Onset   Rectal cancer Sister    Lung cancer Father    Esophageal cancer Father    Breast cancer Sister 71   Breast cancer Mother 61   Colon cancer Other     Aunt, Uncle   Liver cancer Brother     Nephew   Diabetes Sister    Diabetes Brother    Diabetes Other     son & daughter   Heart disease Sister    Heart disease Brother    Kidney disease Sister    Breast cancer Maternal Aunt     patient reports 11 mat aunts with breast cancer   Breast cancer Paternal Aunt 48   Breast cancer Maternal Grandmother     unk age    Chelsea Lester's ancestry is of African American descent. There is no known Jewish ancestry or consanguinity.  GENETIC COUNSELING ASSESSMENT: Chelsea Lester is a 57 y.o. female with a family history of cancer, which includes several different types of cancer including breast cancer and colon cancer. Chelsea Lester's family history is somewhat suggestive of a hereditary predisposition to cancer due to the young age of onset of breast cancer in her family. We, therefore, discussed and recommended the following at today's visit.   DISCUSSION:  We reviewed the characteristics, features and inheritance patterns of hereditary cancer syndromes. We also discussed genetic testing, including the appropriate family members to test, the process of testing, insurance coverage and turn-around-time for results. Chelsea Lester wished to pursue genetic testing. Given the number of different types of cancer in the family, we discussed testing for the Comprehensive Cancer Panel at [facility]. We discussed the implications of a positive, negative and/ or variant of uncertain significance genetic test result.   PLAN: Chelsea Lester. Ziolkowski wished to pursue genetic testing and the blood sample will be sent to [facility] for analysis of the Comprehensive Cancer Gene panel.  We have asked the lab to preauthorize testing from Chelsea Lester  insurance company and thus Chelsea Lester will know her out of pocket expense, if she owes more than $100, and would have to agree to pay the out of pocket before testing proceeds. Chelsea Lester understands this process. Results should be available within approximately 7 weeks time if testing proceeds, at which point they will be disclosed by telephone to Chelsea Lester, as will any additional recommendations warranted by these results. Chelsea Lester will receive a summary of her genetic counseling visit and a copy of her results once available. This information will also be available in Epic.   Based on Chelsea Lester family history, we recommended her sister, who was diagnosed with breast cancer in her 30s, have genetic counseling and testing. We discussed that it is always most informative to initiate genetic testing in a family member diagnosed with cancer, but if this is not possible, we would then test an unaffected family member. Chelsea Lester will speak with this family member and let us know if we can be of any assistance in coordinating genetic counseling and/or testing. In the meantime, we recommended Chelsea Lester continue to follow the cancer screening guidelines given to he by her primary provider. We encouraged Chelsea Lester. Stare to remain in contact with cancer genetics annually so that we can continuously update the family history and inform her of any changes in cancer genetics and testing that may be of benefit for this family. Chelsea Lester.  [place] questions were answered to her satisfaction today. Our contact information was provided should additional questions or concerns arise. Thank you for the referral and allowing [place] to share in the care of your patient.   The patient was seen for a total of 45 minutes, greater than 50% of which was spent face-to-face counseling.  This patient was discussed with Dr. Drue Second and she agrees with the above.    _______________________________________________________________________ For Office  Staff:  Number of people involved in session: 3 Was an Intern/ student involved with case: not applicable

## 2013-03-25 ENCOUNTER — Ambulatory Visit
Admission: RE | Admit: 2013-03-25 | Discharge: 2013-03-25 | Disposition: A | Discharge status: Home / Self Care | Payer: Medicaid Other | Source: Ambulatory Visit | Attending: Cardiovascular Disease | Admitting: Cardiovascular Disease

## 2013-03-25 ENCOUNTER — Other Ambulatory Visit: Payer: Self-pay | Admitting: Cardiovascular Disease

## 2013-03-25 DIAGNOSIS — R0602 Shortness of breath: Secondary | ICD-10-CM

## 2013-06-01 ENCOUNTER — Encounter: Payer: Self-pay | Admitting: Genetic Counselor

## 2013-07-07 ENCOUNTER — Other Ambulatory Visit: Payer: Self-pay | Admitting: Internal Medicine

## 2013-07-07 DIAGNOSIS — N6001 Solitary cyst of right breast: Secondary | ICD-10-CM

## 2013-08-21 ENCOUNTER — Ambulatory Visit
Admission: RE | Admit: 2013-08-21 | Discharge: 2013-08-21 | Disposition: A | Discharge status: Home / Self Care | Payer: Medicaid Other | Source: Ambulatory Visit | Attending: Internal Medicine | Admitting: Internal Medicine

## 2013-08-21 DIAGNOSIS — N6001 Solitary cyst of right breast: Secondary | ICD-10-CM

## 2013-12-09 ENCOUNTER — Emergency Department (INDEPENDENT_AMBULATORY_CARE_PROVIDER_SITE_OTHER): Payer: Medicaid Other

## 2013-12-09 ENCOUNTER — Emergency Department (INDEPENDENT_AMBULATORY_CARE_PROVIDER_SITE_OTHER)
Admission: EM | Admit: 2013-12-09 | Discharge: 2013-12-09 | Disposition: A | Discharge status: Home / Self Care | Payer: Medicaid Other | Source: Home / Self Care | Attending: Emergency Medicine | Admitting: Emergency Medicine

## 2013-12-09 ENCOUNTER — Encounter (HOSPITAL_COMMUNITY): Payer: Self-pay | Admitting: Emergency Medicine

## 2013-12-09 DIAGNOSIS — R5381 Other malaise: Secondary | ICD-10-CM

## 2013-12-09 DIAGNOSIS — I10 Essential (primary) hypertension: Secondary | ICD-10-CM

## 2013-12-09 DIAGNOSIS — R110 Nausea: Secondary | ICD-10-CM

## 2013-12-09 DIAGNOSIS — R5383 Other fatigue: Secondary | ICD-10-CM

## 2013-12-09 DIAGNOSIS — R7309 Other abnormal glucose: Secondary | ICD-10-CM

## 2013-12-09 DIAGNOSIS — I517 Cardiomegaly: Secondary | ICD-10-CM

## 2013-12-09 DIAGNOSIS — R739 Hyperglycemia, unspecified: Secondary | ICD-10-CM

## 2013-12-09 DIAGNOSIS — E876 Hypokalemia: Secondary | ICD-10-CM

## 2013-12-09 DIAGNOSIS — J45909 Unspecified asthma, uncomplicated: Secondary | ICD-10-CM

## 2013-12-09 LAB — POCT I-STAT, CHEM 8
BUN: 5 mg/dL — ABNORMAL LOW (ref 6–23)
Calcium, Ion: 1.11 mmol/L — ABNORMAL LOW (ref 1.12–1.23)
Chloride: 99 meq/L (ref 96–112)
Creatinine, Ser: 0.7 mg/dL (ref 0.50–1.10)
Glucose, Bld: 237 mg/dL — ABNORMAL HIGH (ref 70–99)
HCT: 38 % (ref 36.0–46.0)
Hemoglobin: 12.9 g/dL (ref 12.0–15.0)
Potassium: 3 meq/L — ABNORMAL LOW (ref 3.7–5.3)
Sodium: 142 meq/L (ref 137–147)
TCO2: 28 mmol/L (ref 0–100)

## 2013-12-09 MED ORDER — AMLODIPINE BESYLATE 5 MG PO TABS: 5.0000 mg | ORAL_TABLET | Freq: Every day | ORAL | Status: DC

## 2013-12-09 MED ORDER — POTASSIUM CHLORIDE ER 10 MEQ PO TBCR: 10.0000 meq | EXTENDED_RELEASE_TABLET | Freq: Three times a day (TID) | ORAL | Status: DC

## 2013-12-09 MED ORDER — BUDESONIDE-FORMOTEROL FUMARATE 160-4.5 MCG/ACT IN AERO: 2.0000 | INHALATION_SPRAY | Freq: Two times a day (BID) | RESPIRATORY_TRACT | Status: DC

## 2013-12-09 MED ORDER — HYDROCODONE-ACETAMINOPHEN 5-325 MG PO TABS: ORAL_TABLET | ORAL | Status: DC

## 2013-12-09 MED ORDER — PROMETHAZINE HCL 25 MG PO TABS: 25.0000 mg | ORAL_TABLET | Freq: Four times a day (QID) | ORAL | Status: DC | PRN

## 2013-12-09 NOTE — ED Notes (Signed)
Pt  Has   Symptoms  Of  A  persistant     Cough     With  Body  Aches    /  Dizzy   For  sev  Weeks   Worse  Over  The  Course  Of the  Last  Several  Days         The  Pt  Reports a  History  Of  Asthma    And  Has        Been  Using  A  Nebulizer       For the  Symptoms        She  Ambulated  To  The room         She  Is  Speaking in  Complete  sentances

## 2013-12-09 NOTE — Discharge Instructions (Signed)
Increase insulin to 65 units twice daily.   Potassium Content of Foods Potassium is a mineral found in many foods and drinks. It helps keep fluids and minerals balanced in your body and also affects how steadily your heart beats. The body needs potassium to control blood pressure and to keep the muscles and nervous system healthy. However, certain health conditions and medicine may require you to eat more or less potassium-rich foods and drinks. Your caregiver or dietitian will tell you how much potassium you should have each day. COMMON SERVING SIZES The list below tells you how big or small common portion sizes are:  1 oz.........4 stacked dice.  3 oz........Marland KitchenDeck of cards.  1 tsp.......Marland KitchenTip of little finger.  1 tbsp....Marland KitchenMarland KitchenThumb.  2 tbsp....Marland KitchenMarland KitchenGolf ball.   c..........Marland KitchenHalf of a fist.  1 c...........Marland KitchenA fist. FOODS AND DRINKS HIGH IN POTASSIUM More than 200 mg of potassium per serving. A serving size is  c (120 mL or noted gram weight) unless otherwise stated. While all the items on this list are high in potassium, some items are higher in potassium than others. Fruits  Apricots (sliced), 83 g.  Apricots (dried halves), 3 oz / 24 g.  Avocado (cubed),  c / 50 g.  Banana (sliced), 75 g.  Cantaloupe (cubed), 80 g.  Dates (pitted), 5 whole / 35 g.  Figs (dried), 4 whole / 32 g.  Guava, c / 55 g.  Honeydew, 1 wedge / 85 g.  Kiwi (sliced), 90 g.  Nectarine, 1 small / 129 g.  Orange, 1 medium / 131 g.  Orange juice.  Pomegranate seeds, 87 g.  Pomegranate juice.  Prunes (pitted), 3 whole / 30 g.  Prune juice, 3 oz / 90 mL.  Seedless raisins, 3 tbsp / 27 g. Vegetables  Artichoke,  of a medium / 64 g.  Asparagus (boiled), 90 g.  Baked beans,  c / 63 g.  Bamboo shoots,  c / 38 g.  Beets (cooked slices), 85 g.  Broccoli (boiled), 78 g.  Brussels sprout (boiled), 78 g.  Butternut squash (baked), 103 g.  Chickpea (cooked), 82 g.  Green peas  (cooked), 80 g.  Hubbard squash (baked cubes),  c / 68 g.  Kidney beans (cooked), 5 tbsp / 55 g.  Lima beans (cooked),  c / 43 g.  Navy beans (cooked),  c / 61 g.  Potato (baked), 61 g.  Potato (boiled), 78 g.  Pumpkin (boiled), 123 g.  Refried beans,  c / 79 g.  Spinach (cooked),  c / 45 g.  Split peas (cooked),  c / 65 g.  Sun-dried tomatoes, 2 tbsp / 7 g.  Sweet potato (baked),  c / 50 g.  Tomato (chopped or sliced), 90 g.  Tomato juice.  Tomato paste, 4 tsp / 21 g.  Tomato sauce,  c / 61 g.  Vegetable juice.  White mushrooms (cooked), 78 g.  Yam (cooked or baked),  c / 34 g.  Zucchini squash (boiled), 90 g. Other Foods and Drinks  Almonds (whole),  c / 36 g.  Cashews (oil roasted),  c / 32 g.  Chocolate milk.  Chocolate pudding, 142 g.  Clams (steamed), 1.5 oz / 43 g.  Dark chocolate, 1.5 oz / 42 g.  Fish, 3 oz / 85 g.  King crab (steamed), 3 oz / 85 g.  Lobster (steamed), 4 oz / 113 g.  Milk (skim, 1%, 2%, whole), 1 c / 240 mL.  Milk chocolate, 2.3 oz /  66 g.  Milk shake.  Nonfat fruit variety yogurt, 123 g.  Peanuts (oil roasted), 1 oz / 28 g.  Peanut butter, 2 tbsp / 32 g.  Pistachio nuts, 1 oz / 28 g.  Pumpkin seeds, 1 oz / 28 g.  Red meat (broiled, cooked, grilled), 3 oz / 85 g.  Scallops (steamed), 3 oz / 85 g.  Shredded wheat cereal (dry), 3 oblong biscuits / 75 g.  Spaghetti sauce,  c / 66 g.  Sunflower seeds (dry roasted), 1 oz / 28 g.  Veggie burger, 1 patty / 70 g. FOODS MODERATE IN POTASSIUM Between 150 mg and 200 mg per serving. A serving is  c (120 mL or noted gram weight) unless otherwise stated. Fruits  Grapefruit,  of the fruit / 123 g.  Grapefruit juice.  Pineapple juice.  Plums (sliced), 83 g.  Tangerine, 1 large / 120 g. Vegetables  Carrots (boiled), 78 g.  Carrots (sliced), 61 g.  Rhubarb (cooked with sugar), 120 g.  Rutabaga (cooked), 120 g.  Sweet corn (cooked), 75  g.  Yellow snap beans (cooked), 63 g. Other Foods and Drinks   Bagel, 1 bagel / 98 g.  Chicken breast (roasted and chopped),  c / 70 g.  Chocolate ice cream / 66 g.  Pita bread, 1 large / 64 g.  Shrimp (steamed), 4 oz / 113 g.  Swiss cheese (diced), 70 g.  Vanilla ice cream, 66 g.  Vanilla pudding, 140 g. FOODS LOW IN POTASSIUM Less than 150 mg per serving. A serving size is  cup (120 mL or noted gram weight) unless otherwise stated. If you eat more than 1 serving of a food low in potassium, the food may be considered a food high in potassium. Fruits  Apple (slices), 55 g.  Apple juice.  Applesauce, 122 g.  Blackberries, 72 g.  Blueberries, 74 g.  Cranberries, 50 g.  Cranberry juice.  Fruit cocktail, 119 g.  Fruit punch.  Grapes, 46 g.  Grape juice.  Mandarin oranges (canned), 126 g.  Peach (slices), 77 g.  Pineapple (chunks), 83 g.  Raspberries, 62 g.  Red cherries (without pits), 78 g.  Strawberries (sliced), 83 g.  Watermelon (diced), 76 g. Vegetables  Alfalfa sprouts, 17 g.  Bell peppers (sliced), 46 g.  Cabbage (shredded), 35 g.  Cauliflower (boiled), 62 g.  Celery, 51 g.  Collard greens (boiled), 95 g.  Cucumber (sliced), 52 g.  Eggplant (cubed), 41 g.  Green beans (boiled), 63 g.  Lettuce (shredded), 1 c / 36 g.  Onions (sauteed), 44 g.  Radishes (sliced), 58 g.  Spaghetti squash, 51 g. Other Foods and Drinks  [employer], 1 slice / 28 g.  Black tea.  Brown rice (cooked), 98 g.  Butter croissant, 1 medium / 57 g.  Carbonated soda.  Coffee.  Cheddar cheese (diced), 66 g.  Corn flake cereal (dry), 14 g.  Cottage cheese, 118 g.  Cream of rice cereal (cooked), 122 g.  Cream of wheat cereal (cooked), 126 g.  Crisped rice cereal (dry), 14 g.  Egg (boiled, fried, poached, omelet, scrambled), 1 large / 46 61 g.  English muffin, 1 muffin / 57 g.  Frozen ice pop, 1 pop / 55 g.  Graham cracker, 1  large rectangular cracker / 14 g.  Jelly beans, 112 g.  Non-dairy whipped topping.  Oatmeal, 88 g.  Orange sherbet, 74 g.  Puffed rice cereal (dry), 7 g.  Pasta (cooked), 70  g.  Rice cakes, 4 cakes / 36 g.  Sugared doughnut, 4 oz / 116 g.  White bread, 1 slice / 30 g.  White rice (cooked), 79 93 g.  Wild rice (cooked), 82 g.  Yellow cake, 1 slice / 68 g. Document Released: 06/05/2005 Document Revised: 10/08/2012 Document Reviewed: 03/07/2012 [facility] Patient Information 2014 [employer], [place].

## 2013-12-09 NOTE — ED Provider Notes (Signed)
Chief Complaint   Chief Complaint  Patient presents with  . URI    History of Present Illness   Chelsea Lester is a 58 year old female with hypertension, heart problems, lupus, asthma, and sleep apnea who presents with multiple complaints today. Most of them have been going on for the past 3-5 days. They include generalized aching, hoarseness, headache, shortness of breath, nausea and vomiting, chest pressure, asthma, difficulty sleeping at night, fever of up to 100, nasal congestion, rhinorrhea, sore throat, chest pain, abdominal pain, diarrhea, urinary incontinence, swelling of her arms and legs, feeling weak and dizzy, and heart palpitations.  Review of Systems     Other than as noted above, the patient denies any of the following symptoms: Systemic:  No fever, chills, sweats, fatigue, myalgias, headache, or anorexia. Eye:  No redness, pain or drainage. ENT:  No earache, nasal congestion, rhinorrhea, sinus pressure, or sore throat. Lungs:  No cough, sputum production, wheezing, shortness of breath.  Cardiovascular:  No chest pain, palpitations, or syncope. GI:  No nausea, vomiting, abdominal pain or diarrhea. GU:  No dysuria, frequency, or hematuria. Skin:  No rash or pruritis.   Champion     Past medical history, family history, social history, meds, and allergies were reviewed.  She has no medication allergies. She takes albuterol, aspirin, Bentyl, vitamin D, Pepcid, TriCor, ferrous sulfate, Norco, Levbid, Novolin insulin 70/30, ipratropium, Claritin, metformin, Lopressor, AcipHex, Zocor, Tobradex, and Travatan. She has a history of gastroesophageal reflux, asthma, coronary artery disease, diabetes, hypertension, osteoarthritis, obstructive sleep apnea, obesity, and irritable bowel syndrome.  Physical Examination    Vital signs:  BP 188/88  Pulse 78  Temp(Src) 98.6 F (37 C) (Oral)  Resp 18  SpO2 100% General:  Alert, in no distress. Eye:  PERRL, full EOMs.  Lids and  conjunctivas were normal. ENT:  TMs and canals were normal, without erythema or inflammation.  Nasal mucosa was clear and uncongested, without drainage.  Mucous membranes were moist.  Pharynx was clear, without exudate or drainage.  There were no oral ulcerations or lesions. Neck:  Supple, no adenopathy, tenderness or mass. Thyroid was normal. Lungs:  No respiratory distress.  Lungs were clear to auscultation, without wheezes, rales or rhonchi.  Breath sounds were clear and equal bilaterally. Heart:  Regular rhythm, without gallops, murmers or rubs. Abdomen:  Soft, flat, and non-tender to palpation.  No hepatosplenomagaly or mass. Skin:  Clear, warm, and dry, without rash or lesions.  Labs    Results for orders placed during the hospital encounter of 12/09/13  POCT I-STAT, CHEM 8      Result Value Range   Sodium 142  137 - 147 mEq/L   Potassium 3.0 (*) 3.7 - 5.3 mEq/L   Chloride 99  96 - 112 mEq/L   BUN 5 (*) 6 - 23 mg/dL   Creatinine, Ser 0.70  0.50 - 1.10 mg/dL   Glucose, Bld 237 (*) 70 - 99 mg/dL   Calcium, Ion 1.11 (*) 1.12 - 1.23 mmol/L   TCO2 28  0 - 100 mmol/L   Hemoglobin 12.9  12.0 - 15.0 g/dL   HCT 38.0  36.0 - 46.0 %     Radiology    Dg Chest 2 View  12/09/2013   CLINICAL DATA:  Chest pain for 1 week left of midline, history diabetes, coronary artery disease, hypertension  EXAM: CHEST  2 VIEW  COMPARISON:  03/25/2013  FINDINGS: Enlargement of cardiac silhouette.  Mildly tortuous aorta.  Mediastinal contours and  pulmonary vascularity normal.  Basilar hypo inflation and atelectasis.  Upper lungs clear.  No pleural effusion or pneumothorax.  Bones unremarkable.  IMPRESSION: Enlargement of cardiac silhouette and bibasilar atelectasis.   Electronically Signed   By: Lavonia Dana M.D.   On: 12/09/2013 13:48   EKG Results    Date: 12/09/2013  Rate: 96  Rhythm: normal sinus rhythm  QRS Axis: normal  Intervals: normal  ST/T Wave abnormalities: nonspecific T wave changes   Conduction Disutrbances:none  Narrative Interpretation: Normal sinus rhythm, nonspecific T wave abnormality, prolonged QT interval, no change since previous EKG.  Old EKG Reviewed: unchanged  Assessment   The primary encounter diagnosis was Hypokalemia. Diagnoses of Hyperglycemia, Hypertension, Cardiomegaly, Fatigue, Asthma, and Nausea were also pertinent to this visit.  She has multiple symptoms. Much of this may be viral. She needs correction of her potassium, better blood pressure control, and better blood sugar control. Suggested she increase her insulin dose by 5 units twice daily to 65 units twice a day. Amlodipine 5 mg was added. She was begun Symbicort for better asthma control. Suggested she followup within a week with her primary care physician.  Plan     1.  Meds:  The following meds were prescribed:   Discharge Medication List as of 12/09/2013  2:17 PM    START taking these medications   Details  amLODipine (NORVASC) 5 MG tablet Take 1 tablet (5 mg total) by mouth daily., Starting 12/09/2013, Until Discontinued, Normal    budesonide-formoterol (SYMBICORT) 160-4.5 MCG/ACT inhaler Inhale 2 puffs into the lungs 2 (two) times daily., Starting 12/09/2013, Until Discontinued, Normal    HYDROcodone-acetaminophen (NORCO/VICODIN) 5-325 MG per tablet 1 to 2 tabs every 4 to 6 hours as needed for pain., Print    potassium chloride (K-DUR) 10 MEQ tablet Take 1 tablet (10 mEq total) by mouth 3 (three) times daily., Starting 12/09/2013, Until Discontinued, Normal    promethazine (PHENERGAN) 25 MG tablet Take 1 tablet (25 mg total) by mouth every 6 (six) hours as needed for nausea or vomiting., Starting 12/09/2013, Until Discontinued, Normal        2.  Patient Education/Counseling:  The patient was given appropriate handouts, self care instructions, and instructed in symptomatic relief.    3.  Follow up:  The patient was told to follow up here if no better in 3 to 4 days, or sooner if becoming  worse in any way, and given some red flag symptoms such as worsening difficulty breathing or chest pain which would prompt immediate return.  Follow up at the emergency room if she should feel worse.        Harden Mo, MD 12/09/13 [id number]

## 2013-12-15 ENCOUNTER — Emergency Department (HOSPITAL_COMMUNITY)
Admission: EM | Admit: 2013-12-15 | Discharge: 2013-12-15 | Disposition: A | Discharge status: Home / Self Care | Payer: Medicaid Other | Attending: Emergency Medicine | Admitting: Emergency Medicine

## 2013-12-15 ENCOUNTER — Encounter (HOSPITAL_COMMUNITY): Payer: Self-pay | Admitting: Emergency Medicine

## 2013-12-15 ENCOUNTER — Emergency Department (HOSPITAL_COMMUNITY): Payer: Medicaid Other

## 2013-12-15 DIAGNOSIS — IMO0002 Reserved for concepts with insufficient information to code with codable children: Secondary | ICD-10-CM | POA: Insufficient documentation

## 2013-12-15 DIAGNOSIS — R5383 Other fatigue: Secondary | ICD-10-CM

## 2013-12-15 DIAGNOSIS — R52 Pain, unspecified: Secondary | ICD-10-CM | POA: Insufficient documentation

## 2013-12-15 DIAGNOSIS — J45901 Unspecified asthma with (acute) exacerbation: Secondary | ICD-10-CM | POA: Insufficient documentation

## 2013-12-15 DIAGNOSIS — B349 Viral infection, unspecified: Secondary | ICD-10-CM

## 2013-12-15 DIAGNOSIS — Z79899 Other long term (current) drug therapy: Secondary | ICD-10-CM | POA: Insufficient documentation

## 2013-12-15 DIAGNOSIS — R197 Diarrhea, unspecified: Secondary | ICD-10-CM | POA: Insufficient documentation

## 2013-12-15 DIAGNOSIS — R609 Edema, unspecified: Secondary | ICD-10-CM | POA: Insufficient documentation

## 2013-12-15 DIAGNOSIS — Z860100 Personal history of colon polyps, unspecified: Secondary | ICD-10-CM | POA: Insufficient documentation

## 2013-12-15 DIAGNOSIS — B9789 Other viral agents as the cause of diseases classified elsewhere: Secondary | ICD-10-CM | POA: Insufficient documentation

## 2013-12-15 DIAGNOSIS — Z7982 Long term (current) use of aspirin: Secondary | ICD-10-CM | POA: Insufficient documentation

## 2013-12-15 DIAGNOSIS — Z91040 Latex allergy status: Secondary | ICD-10-CM | POA: Insufficient documentation

## 2013-12-15 DIAGNOSIS — I2510 Atherosclerotic heart disease of native coronary artery without angina pectoris: Secondary | ICD-10-CM | POA: Insufficient documentation

## 2013-12-15 DIAGNOSIS — Z8659 Personal history of other mental and behavioral disorders: Secondary | ICD-10-CM | POA: Insufficient documentation

## 2013-12-15 DIAGNOSIS — M1990 Unspecified osteoarthritis, unspecified site: Secondary | ICD-10-CM | POA: Insufficient documentation

## 2013-12-15 DIAGNOSIS — I10 Essential (primary) hypertension: Secondary | ICD-10-CM | POA: Insufficient documentation

## 2013-12-15 DIAGNOSIS — K219 Gastro-esophageal reflux disease without esophagitis: Secondary | ICD-10-CM | POA: Insufficient documentation

## 2013-12-15 DIAGNOSIS — E119 Type 2 diabetes mellitus without complications: Secondary | ICD-10-CM | POA: Insufficient documentation

## 2013-12-15 DIAGNOSIS — K589 Irritable bowel syndrome without diarrhea: Secondary | ICD-10-CM | POA: Insufficient documentation

## 2013-12-15 DIAGNOSIS — Z8669 Personal history of other diseases of the nervous system and sense organs: Secondary | ICD-10-CM | POA: Insufficient documentation

## 2013-12-15 DIAGNOSIS — Z794 Long term (current) use of insulin: Secondary | ICD-10-CM | POA: Insufficient documentation

## 2013-12-15 DIAGNOSIS — R079 Chest pain, unspecified: Secondary | ICD-10-CM | POA: Insufficient documentation

## 2013-12-15 DIAGNOSIS — R112 Nausea with vomiting, unspecified: Secondary | ICD-10-CM | POA: Insufficient documentation

## 2013-12-15 DIAGNOSIS — R5381 Other malaise: Secondary | ICD-10-CM | POA: Insufficient documentation

## 2013-12-15 DIAGNOSIS — D649 Anemia, unspecified: Secondary | ICD-10-CM | POA: Insufficient documentation

## 2013-12-15 LAB — HEPATIC FUNCTION PANEL
ALT: 18 U/L (ref 0–35)
AST: 15 U/L (ref 0–37)
Albumin: 3.5 g/dL (ref 3.5–5.2)
Alkaline Phosphatase: 81 U/L (ref 39–117)
Total Bilirubin: <0.2 mg/dL — ABNORMAL LOW (ref 0.3–1.2)
Total Protein: 7.5 g/dL (ref 6.0–8.3)

## 2013-12-15 LAB — BASIC METABOLIC PANEL
BUN: 10 mg/dL (ref 6–23)
CALCIUM: 8.7 mg/dL (ref 8.4–10.5)
CHLORIDE: 104 meq/L (ref 96–112)
CO2: 27 meq/L (ref 19–32)
Creatinine, Ser: 0.68 mg/dL (ref 0.50–1.10)
GFR calc Af Amer: >90 mL/min (ref >90)
GFR calc non Af Amer: >90 mL/min (ref >90)
Glucose, Bld: 107 mg/dL — ABNORMAL HIGH (ref 70–99)
Potassium: 3.4 mEq/L — ABNORMAL LOW (ref 3.7–5.3)
SODIUM: 145 meq/L (ref 137–147)

## 2013-12-15 LAB — CBC WITH DIFFERENTIAL/PLATELET
Basophils Absolute: 0 10*3/uL (ref 0.0–0.1)
Basophils Relative: 0 % (ref 0–1)
EOS ABS: 0.1 10*3/uL (ref 0.0–0.7)
Eosinophils Relative: 1 % (ref 0–5)
HCT: 32.8 % — ABNORMAL LOW (ref 36.0–46.0)
Hemoglobin: 10.6 g/dL — ABNORMAL LOW (ref 12.0–15.0)
LYMPHS PCT: 36 % (ref 12–46)
Lymphs Abs: 3.1 10*3/uL (ref 0.7–4.0)
MCH: 22.8 pg — ABNORMAL LOW (ref 26.0–34.0)
MCHC: 32.3 g/dL (ref 30.0–36.0)
MCV: 70.5 fL — ABNORMAL LOW (ref 78.0–100.0)
Monocytes Absolute: 0.4 10*3/uL (ref 0.1–1.0)
Monocytes Relative: 5 % (ref 3–12)
NEUTROS PCT: 59 % (ref 43–77)
Neutro Abs: 5.1 10*3/uL (ref 1.7–7.7)
PLATELETS: 252 10*3/uL (ref 150–400)
RBC: 4.65 MIL/uL (ref 3.87–5.11)
RDW: 17.7 % — ABNORMAL HIGH (ref 11.5–15.5)
WBC: 8.7 10*3/uL (ref 4.0–10.5)

## 2013-12-15 LAB — LIPASE, BLOOD: Lipase: 20 U/L (ref 11–59)

## 2013-12-15 LAB — GLUCOSE, CAPILLARY: Glucose-Capillary: 118 mg/dL — ABNORMAL HIGH (ref 70–99)

## 2013-12-15 LAB — PRO B NATRIURETIC PEPTIDE: Pro B Natriuretic peptide (BNP): 10.7 pg/mL (ref 0–125)

## 2013-12-15 LAB — TYPE AND SCREEN
ABO/RH(D): B POS
ANTIBODY SCREEN: NEGATIVE

## 2013-12-15 LAB — ABO/RH: ABO/RH(D): B POS

## 2013-12-15 LAB — POCT I-STAT TROPONIN I: Troponin i, poc: 0 ng/mL (ref 0.00–0.08)

## 2013-12-15 LAB — OCCULT BLOOD X 1 CARD TO LAB, STOOL: FECAL OCCULT BLD: NEGATIVE

## 2013-12-15 MED ORDER — ONDANSETRON HCL 4 MG/2ML IJ SOLN
4.0000 mg | Freq: Once | INTRAMUSCULAR | Status: AC
  Administered 2013-12-15: 4 mg via INTRAVENOUS
  Filled 2013-12-15: qty 2

## 2013-12-15 MED ORDER — MORPHINE SULFATE 4 MG/ML IJ SOLN
4.0000 mg | Freq: Once | INTRAMUSCULAR | Status: AC
  Administered 2013-12-15: 4 mg via INTRAVENOUS
  Filled 2013-12-15: qty 1

## 2013-12-15 MED ORDER — SODIUM CHLORIDE 0.9 % IV BOLUS (SEPSIS)
1000.0000 mL | Freq: Once | INTRAVENOUS | Status: AC
  Administered 2013-12-15: 1000 mL via INTRAVENOUS

## 2013-12-15 MED ORDER — TRAMADOL HCL 50 MG PO TABS: 50.0000 mg | ORAL_TABLET | Freq: Four times a day (QID) | ORAL | Status: DC | PRN

## 2013-12-15 NOTE — ED Notes (Signed)
Spoke with [facility] nurse, will be down as soon as possible.

## 2013-12-15 NOTE — ED Notes (Signed)
Pt. Ambulated to restroom with walker

## 2013-12-15 NOTE — ED Notes (Signed)
Pt. Is in X-ray , has not arrived to Pod A 6

## 2013-12-15 NOTE — ED Notes (Signed)
Pt presents to department for evaluation of chest pain, back pain, pain all over body. Also states nausea/vomiting and diarrhea. Ongoing x1 week. Was seen at [facility] for same recently. 10/10 chest pain upon arrival to ED. Respirations unlabored. Skin warm and dry. Pt is conscious alert and oriented x4. No signs of acute distress noted.

## 2013-12-15 NOTE — ED Notes (Signed)
Spoke with the IV nurse, will be down to start an IV

## 2013-12-15 NOTE — Discharge Instructions (Signed)
Rest, stay well-hydrated.  Anemia, Nonspecific Anemia is a condition in which the concentration of red blood cells or hemoglobin in the blood is below normal. Hemoglobin is a substance in red blood cells that carries oxygen to the tissues of the body. Anemia results in not enough oxygen reaching these tissues.  CAUSES  Common causes of anemia include:   Excessive bleeding. Bleeding may be internal or external. This includes excessive bleeding from periods (in women) or from the intestine.   Poor nutrition.   Chronic kidney, thyroid, and liver disease.  Bone marrow disorders that decrease red blood cell production.  Cancer and treatments for cancer.  HIV, AIDS, and their treatments.  Spleen problems that increase red blood cell destruction.  Blood disorders.  Excess destruction of red blood cells due to infection, medicines, and autoimmune disorders. SIGNS AND SYMPTOMS   Minor weakness.   Dizziness.   Headache.  Palpitations.   Shortness of breath, especially with exercise.   Paleness.  Cold sensitivity.  Indigestion.  Nausea.  Difficulty sleeping.  Difficulty concentrating. Symptoms may occur suddenly or they may develop slowly.  DIAGNOSIS  Additional blood tests are often needed. These help your health care provider determine the best treatment. Your health care provider will check your stool for blood and look for other causes of blood loss.  TREATMENT  Treatment varies depending on the cause of the anemia. Treatment can include:   Supplements of iron, vitamin K44, or folic acid.   Hormone medicines.   A blood transfusion. This may be needed if blood loss is severe.   Hospitalization. This may be needed if there is significant continual blood loss.   Dietary changes.  Spleen removal. HOME CARE INSTRUCTIONS Keep all follow-up appointments. It often takes many weeks to correct anemia, and having your health care provider check on your  condition and your response to treatment is very important. SEEK IMMEDIATE MEDICAL CARE IF:   You develop extreme weakness, shortness of breath, or chest pain.   You become dizzy or have trouble concentrating.  You develop heavy vaginal bleeding.   You develop a rash.   You have bloody or black, tarry stools.   You faint.   You vomit up blood.   You vomit repeatedly.   You have abdominal pain.  You have a fever or persistent symptoms for more than 2 3 days.   You have a fever and your symptoms suddenly get worse.   You are dehydrated.  MAKE SURE YOU:  Understand these instructions.  Will watch your condition.  Will get help right away if you are not doing well or get worse. Document Released: 11/29/2004 Document Revised: 06/24/2013 Document Reviewed: 04/17/2013 [facility] Patient Information 2014 [place].  Viral Infections A viral infection can be caused by different types of viruses.Most viral infections are not serious and resolve on their own. However, some infections may cause severe symptoms and may lead to further complications. SYMPTOMS Viruses can frequently cause:  Minor sore throat.  Aches and pains.  Headaches.  Runny nose.  Different types of rashes.  Watery eyes.  Tiredness.  Cough.  Loss of appetite.  Gastrointestinal infections, resulting in nausea, vomiting, and diarrhea. These symptoms do not respond to antibiotics because the infection is not caused by bacteria. However, you might catch a bacterial infection following the viral infection. This is sometimes called a "superinfection." Symptoms of such a bacterial infection may include:  Worsening sore throat with pus and difficulty swallowing.  Swollen neck glands.  Chills and a high or persistent fever.  Severe headache.  Tenderness over the sinuses.  Persistent overall ill feeling (malaise), muscle aches, and tiredness (fatigue).  Persistent cough.  Yellow,  green, or brown mucus production with coughing. HOME CARE INSTRUCTIONS   Only take over-the-counter or prescription medicines for pain, discomfort, diarrhea, or fever as directed by your caregiver.  Drink enough water and fluids to keep your urine clear or pale yellow. Sports drinks can provide valuable electrolytes, sugars, and hydration.  Get plenty of rest and maintain proper nutrition. Soups and broths with crackers or rice are fine. SEEK IMMEDIATE MEDICAL CARE IF:   You have severe headaches, shortness of breath, chest pain, neck pain, or an unusual rash.  You have uncontrolled vomiting, diarrhea, or you are unable to keep down fluids.  You or your child has an oral temperature above 102 F (38.9 C), not controlled by medicine.  Your baby is older than 3 months with a rectal temperature of 102 F (38.9 C) or higher.  Your baby is 44 months old or younger with a rectal temperature of 100.4 F (38 C) or higher. MAKE SURE YOU:   Understand these instructions.  Will watch your condition.  Will get help right away if you are not doing well or get worse. Document Released: 08/01/2005 Document Revised: 01/14/2012 Document Reviewed: 02/26/2011 [facility] Patient Information 2014 [place], [place].  Chest Pain (Nonspecific) It is often hard to give a specific diagnosis for the cause of chest pain. There is always a chance that your pain could be related to something serious, such as a heart attack or a blood clot in the lungs. You need to follow up with your caregiver for further evaluation. CAUSES   Heartburn.  Pneumonia or bronchitis.  Anxiety or stress.  Inflammation around your heart (pericarditis) or lung (pleuritis or pleurisy).  A blood clot in the lung.  A collapsed lung (pneumothorax). It can develop suddenly on its own (spontaneous pneumothorax) or from injury (trauma) to the chest.  Shingles infection (herpes zoster virus). The chest wall is composed of bones,  muscles, and cartilage. Any of these can be the source of the pain.  The bones can be bruised by injury.  The muscles or cartilage can be strained by coughing or overwork.  The cartilage can be affected by inflammation and become sore (costochondritis). DIAGNOSIS  Lab tests or other studies, such as X-rays, electrocardiography, stress testing, or cardiac imaging, may be needed to find the cause of your pain.  TREATMENT   Treatment depends on what may be causing your chest pain. Treatment may include:  Acid blockers for heartburn.  Anti-inflammatory medicine.  Pain medicine for inflammatory conditions.  Antibiotics if an infection is present.  You may be advised to change lifestyle habits. This includes stopping smoking and avoiding alcohol, caffeine, and chocolate.  You may be advised to keep your head raised (elevated) when sleeping. This reduces the chance of acid going backward from your stomach into your esophagus.  Most of the time, nonspecific chest pain will improve within 2 to 3 days with rest and mild pain medicine. HOME CARE INSTRUCTIONS   If antibiotics were prescribed, take your antibiotics as directed. Finish them even if you start to feel better.  For the next few days, avoid physical activities that bring on chest pain. Continue physical activities as directed.  Do not smoke.  Avoid drinking alcohol.  Only take over-the-counter or prescription medicine for pain, discomfort, or fever as directed by  your caregiver.  Follow your caregiver's suggestions for further testing if your chest pain does not go away.  Keep any follow-up appointments you made. If you do not go to an appointment, you could develop lasting (chronic) problems with pain. If there is any problem keeping an appointment, you must call to reschedule. SEEK MEDICAL CARE IF:   You think you are having problems from the medicine you are taking. Read your medicine instructions carefully.  Your chest  pain does not go away, even after treatment.  You develop a rash with blisters on your chest. SEEK IMMEDIATE MEDICAL CARE IF:   You have increased chest pain or pain that spreads to your arm, neck, jaw, back, or abdomen.  You develop shortness of breath, an increasing cough, or you are coughing up blood.  You have severe back or abdominal pain, feel nauseous, or vomit.  You develop severe weakness, fainting, or chills.  You have a fever. THIS IS AN EMERGENCY. Do not wait to see if the pain will go away. Get medical help at once. Call your local emergency services (911 in [place] Do not drive yourself to the hospital. MAKE SURE YOU:   Understand these instructions.  Will watch your condition.  Will get help right away if you are not doing well or get worse. Document Released: 08/01/2005 Document Revised: 01/14/2012 Document Reviewed: 05/27/2008 [facility] Patient Information 2014 [place].

## 2013-12-15 NOTE — ED Provider Notes (Signed)
CSN: [id number]     Arrival date & time 12/15/13  1538 History   First MD Initiated Contact with Patient 12/15/13 1559     Chief Complaint  Patient presents with  . Chest Pain     (Consider location/radiation/quality/duration/timing/severity/associated sxs/prior Treatment) HPI Comments: Patient is a 58 year old morbidly obese female with a past medical history of CAD, diabetes, asthma,. And hypertension who presents to the emergency department complaining of worsening chest pain x3 days. Patient states over the past month she has had intermittent chest pain with associated nausea, vomiting, diarrhea and body aches. Chest pain located on left side radiating down her left arm described as pressure and "soreness". She was seen at urgent care on February 4 for similar symptoms and told she had low potassium, has been taking potassium pills as prescribed along with other pain medication prescribed that day with minimal relief. Patient states she's been feeling very weak and fatigued. Pt reports multiple episodes of diarrhea, states she did not check to see what it looked like. Admits to shortness of breath and non-productive cough both at rest and on exertion. Admits to lower extremity edema, states this happens frequently. States she normally ambulates on her own, however over the past month she needs her walker to get around. Cardiologist Dr. Doylene Canard. Denies fevers. Last cardiac cath 2012 without significant blockage noted.  The history is provided by the patient.    Past Medical History  Diagnosis Date  . GERD (gastroesophageal reflux disease)   . Colon polyp   . Asthma   . CAD (coronary artery disease)   . Diabetes mellitus   . Hypertension   . Osteoarthritis   . Microcytosis   . Sleep disorder   . Obesity   . Depression 11/07    Hospitalization required  . Cataract   . Diverticulosis   . Esophageal stricture   . Hemorrhoids   . IBS (irritable bowel syndrome)    Past Surgical  History  Procedure Laterality Date  . Abdominal hysterectomy    . Eye surgery    . Upper gastrointestinal endoscopy    . Colonoscopy    . Abdominal hysterectomy     Family History  Problem Relation Age of Onset  . Rectal cancer Sister   . Lung cancer Father   . Esophageal cancer Father   . Breast cancer Sister 81  . Breast cancer Mother 70  . Colon cancer Other     Aunt, Uncle  . Liver cancer Brother     Nephew  . Diabetes Sister   . Diabetes Brother   . Diabetes Other     son & daughter  . Heart disease Sister   . Heart disease Brother   . Kidney disease Sister   . Breast cancer Maternal Aunt     patient reports 11 mat aunts with breast cancer  . Breast cancer Paternal Aunt 85  . Breast cancer Maternal Grandmother     unk age   History  Substance Use Topics  . Smoking status: Never Smoker   . Smokeless tobacco: Never Used  . Alcohol Use: No   OB History   Grav Para Term Preterm Abortions TAB SAB Ect Mult Living                 Review of Systems  Constitutional: Positive for activity change and fatigue.  Respiratory: Positive for cough and shortness of breath.   Cardiovascular: Positive for chest pain and leg swelling.  Gastrointestinal: Positive for nausea,  vomiting and diarrhea.  Musculoskeletal: Positive for arthralgias and myalgias.  Neurological: Positive for weakness.  All other systems reviewed and are negative.      Allergies  Latex and Percocet  Home Medications   Current Outpatient Rx  Name  Route  Sig  Dispense  Refill  . albuterol (PROVENTIL,VENTOLIN) 90 MCG/ACT inhaler   Inhalation   Inhale 2 puffs into the lungs every 6 (six) hours as needed. Wheezing or shortness of breath         . amLODipine (NORVASC) 5 MG tablet   Oral   Take 1 tablet (5 mg total) by mouth daily.   30 tablet   0   . aspirin 81 MG tablet   Oral   Take 81 mg by mouth daily.           . budesonide-formoterol (SYMBICORT) 160-4.5 MCG/ACT inhaler    Inhalation   Inhale 2 puffs into the lungs 2 (two) times daily.   1 Inhaler   12   . dicyclomine (BENTYL) 10 MG capsule      Take 1 tab 3 times a day before meals.   90 capsule   1   . ergocalciferol (VITAMIN D2) 50000 UNITS capsule   Oral   Take 50,000 Units by mouth once a week. Usually takes on saturday         . famotidine (PEPCID) 40 MG tablet      TAKE 1 TABLET EACH DAY   30 tablet   12   . fenofibrate (TRICOR) 145 MG tablet   Oral   Take 145 mg by mouth daily.           . ferrous sulfate 325 (65 FE) MG tablet   Oral   Take 325 mg by mouth daily with breakfast.           . fluticasone-salmeterol (ADVAIR HFA) 115-21 MCG/ACT inhaler   Inhalation   Inhale 2 puffs into the lungs as directed.           Marland Kitchen HYDROcodone-acetaminophen (NORCO/VICODIN) 5-325 MG per tablet      1 to 2 tabs every 4 to 6 hours as needed for pain.   20 tablet   0   . HYDROcodone-acetaminophen (VICODIN) 5-500 MG per tablet   Oral   Take 1 tablet by mouth every 6 (six) hours as needed.           . hyoscyamine (LEVBID) 0.375 MG 12 hr tablet   Oral   Take 1 tablet (0.375 mg total) by mouth every 12 (twelve) hours as needed.   60 tablet   1   . insulin NPH-insulin regular (NOVOLIN 70/30) (70-30) 100 UNIT/ML injection   Subcutaneous   Inject 65 Units into the skin 2 (two) times daily with a meal.          . Insulin Syringes, Disposable, U-100 1 ML MISC   Does not apply   by Does not apply route.           Marland Kitchen ipratropium (ATROVENT) 0.02 % nebulizer solution   Nebulization   Take 500 mcg by nebulization 3 (three) times daily as needed. wheezing         . Lancets MISC   Does not apply   by Does not apply route.           . loratadine (CLARITIN) 10 MG tablet   Oral   Take 10 mg by mouth daily.           Marland Kitchen  metFORMIN (GLUCOPHAGE) 500 MG tablet   Oral   Take 500 mg by mouth.         . metoprolol (LOPRESSOR) 50 MG tablet   Oral   Take 50 mg by mouth 2 (two)  times daily.           . NON FORMULARY      T[employer]etrack test strip( Glucose Blood)          . potassium chloride (K-DUR) 10 MEQ tablet   Oral   Take 1 tablet (10 mEq total) by mouth 3 (three) times daily.   45 tablet   0   . promethazine (PHENERGAN) 25 MG tablet   Oral   Take 1 tablet (25 mg total) by mouth every 6 (six) hours as needed for nausea or vomiting.   30 tablet   0   . RABEprazole (ACIPHEX) 20 MG tablet   Oral   Take 20 mg by mouth 2 (two) times daily. Take 30 min before dinner. Lot # [id number] Exp date: 11/2012 Samples given.         . simvastatin (ZOCOR) 40 MG tablet   Oral   Take 40 mg by mouth at bedtime.           Marland Kitchen tobramycin (TOBREX) 0.3 % ophthalmic ointment   Both Eyes   Place 1 application into both eyes 4 (four) times daily.           . travoprost, benzalkonium, (TRAVATAN) 0.004 % ophthalmic solution   Both Eyes   Place 1 drop into both eyes at bedtime.            BP 154/87  Pulse 79  Temp(Src) 98 F (36.7 C) (Oral)  Resp 18  Ht 5\' 7"  (1.702 m)  Wt 350 lb (158.759 kg)  BMI 54.80 kg/m2  SpO2 99% Physical Exam  Nursing note and vitals reviewed. Constitutional: She is oriented to person, place, and time. She appears well-developed and well-nourished. No distress.  Morbidly obese.  HENT:  Head: Normocephalic and atraumatic.  Mouth/Throat: Mucous membranes are dry.  Eyes: Conjunctivae and EOM are normal. Pupils are equal, round, and reactive to light.  Neck: Normal range of motion. Neck supple. No JVD present.  Cardiovascular: Normal rate, regular rhythm, normal heart sounds and intact distal pulses.   +1 pitting edema LE bilateral. Equal distal pulses.  Pulmonary/Chest: Effort normal and breath sounds normal.  "soreness" to palpation of chest throughout  Abdominal: Soft. Bowel sounds are normal. There is no tenderness.  Genitourinary:  No gross blood on exam glove.  Musculoskeletal: Normal range of motion. She exhibits no edema.   Neurological: She is alert and oriented to person, place, and time. She has normal strength. No sensory deficit.  Moves limbs without ataxia. Speech fluent, goal oriented. Equal grip strength.  Skin: Skin is warm and dry. She is not diaphoretic.  Psychiatric: She has a normal mood and affect. Her behavior is normal.    ED Course  Procedures (including critical care time) Labs Review Labs Reviewed  CBC WITH DIFFERENTIAL - Abnormal; Notable for the following:    Hemoglobin 10.6 (*)    HCT 32.8 (*)    MCV 70.5 (*)    MCH 22.8 (*)    RDW 17.7 (*)    All other components within normal limits  GLUCOSE, CAPILLARY - Abnormal; Notable for the following:    Glucose-Capillary 118 (*)    All other components within normal limits  BASIC METABOLIC PANEL - Abnormal;  Notable for the following:    Potassium 3.4 (*)    Glucose, Bld 107 (*)    All other components within normal limits  HEPATIC FUNCTION PANEL - Abnormal; Notable for the following:    Total Bilirubin <0.2 (*)    All other components within normal limits  PRO B NATRIURETIC PEPTIDE  LIPASE, BLOOD  OCCULT BLOOD X 1 CARD TO LAB, STOOL  POCT I-STAT TROPONIN I  TYPE AND SCREEN  ABO/RH   Imaging Review Dg Chest 2 View  12/15/2013   CLINICAL DATA:  Chest pain  EXAM: CHEST  2 VIEW  COMPARISON:  None.  FINDINGS: The heart size and mediastinal contours are within normal limits. Both lungs are clear. The visualized skeletal structures are unremarkable.  IMPRESSION: No active cardiopulmonary disease.   Electronically Signed   By: Inez Catalina M.D.   On: 12/15/2013 16:43    EKG Interpretation   None       MDM   Final diagnoses:  Anemia  Chest pain  Viral illness   Pt presenting with multiple complaints, major complaints of chest pain and fatigue. She appears in NAD, VSS. EKG without significant changes. Dry MM. Hgb dropped from 12.9 to 10.6 over 6 days. CXR clear. Doubt cardiac in origin, low risk heart score 3, troponin  negative. Doubt PE. 6:39 PM Fecal occult blood negative. Patient is resting comfortably on exam bed, sleeping. Plan to rehydrate with 2 L fluid, re-evaluate. Possible dehydration from diarrhea. Case discussed with attending Dr. Jeneen Rinks who agrees with plan of care. 11:08 PM After receiving fluids patient reports improvement of her symptoms. She is awake, sitting up and eating a sandwich. Vital signs remain stable. Chest pain has subsided since receiving fluids. She is no longer short of breath. Patient is stable for discharge home, followup with PCP. Return precautions given. Patient states understanding of treatment care plan and is agreeable.    Illene Labrador, PA-C 12/15/13 2310

## 2013-12-20 NOTE — ED Provider Notes (Signed)
Medical screening examination/treatment/procedure(s) were performed by non-physician practitioner and as supervising physician I was immediately available for consultation/collaboration.  EKG Interpretation    Date/Time:  Tuesday December 15 2013 15:48:34 EST Ventricular Rate:  87 PR Interval:  156 QRS Duration: 78 QT Interval:  386 QTC Calculation: 464 R Axis:   8 Text Interpretation:  Normal sinus rhythm Minimal voltage criteria for LVH, may be normal variant Nonspecific T wave abnormality Prolonged QT Abnormal ECG ED PHYSICIAN INTERPRETATION AVAILABLE IN [facility] Confirmed by TEST, RECORD ([id number]) on 12/17/2013 7:26:09 AM              Tanna Furry, MD 12/20/13 0710

## 2013-12-27 ENCOUNTER — Encounter (HOSPITAL_COMMUNITY): Payer: Self-pay | Admitting: Emergency Medicine

## 2013-12-27 ENCOUNTER — Emergency Department (HOSPITAL_COMMUNITY)
Admission: EM | Admit: 2013-12-27 | Discharge: 2013-12-27 | Disposition: A | Discharge status: Home / Self Care | Payer: Medicaid Other | Attending: Emergency Medicine | Admitting: Emergency Medicine

## 2013-12-27 DIAGNOSIS — F3289 Other specified depressive episodes: Secondary | ICD-10-CM | POA: Insufficient documentation

## 2013-12-27 DIAGNOSIS — J45901 Unspecified asthma with (acute) exacerbation: Secondary | ICD-10-CM | POA: Insufficient documentation

## 2013-12-27 DIAGNOSIS — J069 Acute upper respiratory infection, unspecified: Secondary | ICD-10-CM | POA: Insufficient documentation

## 2013-12-27 DIAGNOSIS — E119 Type 2 diabetes mellitus without complications: Secondary | ICD-10-CM | POA: Insufficient documentation

## 2013-12-27 DIAGNOSIS — Z79899 Other long term (current) drug therapy: Secondary | ICD-10-CM | POA: Insufficient documentation

## 2013-12-27 DIAGNOSIS — Z8669 Personal history of other diseases of the nervous system and sense organs: Secondary | ICD-10-CM | POA: Insufficient documentation

## 2013-12-27 DIAGNOSIS — I2510 Atherosclerotic heart disease of native coronary artery without angina pectoris: Secondary | ICD-10-CM | POA: Insufficient documentation

## 2013-12-27 DIAGNOSIS — E669 Obesity, unspecified: Secondary | ICD-10-CM | POA: Insufficient documentation

## 2013-12-27 DIAGNOSIS — I10 Essential (primary) hypertension: Secondary | ICD-10-CM | POA: Insufficient documentation

## 2013-12-27 DIAGNOSIS — Z860100 Personal history of colon polyps, unspecified: Secondary | ICD-10-CM | POA: Insufficient documentation

## 2013-12-27 DIAGNOSIS — Z794 Long term (current) use of insulin: Secondary | ICD-10-CM | POA: Insufficient documentation

## 2013-12-27 DIAGNOSIS — M1990 Unspecified osteoarthritis, unspecified site: Secondary | ICD-10-CM | POA: Insufficient documentation

## 2013-12-27 DIAGNOSIS — Z91040 Latex allergy status: Secondary | ICD-10-CM | POA: Insufficient documentation

## 2013-12-27 DIAGNOSIS — F329 Major depressive disorder, single episode, unspecified: Secondary | ICD-10-CM | POA: Insufficient documentation

## 2013-12-27 DIAGNOSIS — Z7982 Long term (current) use of aspirin: Secondary | ICD-10-CM | POA: Insufficient documentation

## 2013-12-27 DIAGNOSIS — K589 Irritable bowel syndrome without diarrhea: Secondary | ICD-10-CM | POA: Insufficient documentation

## 2013-12-27 DIAGNOSIS — K219 Gastro-esophageal reflux disease without esophagitis: Secondary | ICD-10-CM | POA: Insufficient documentation

## 2013-12-27 MED ORDER — OXYMETAZOLINE HCL 0.05 % NA SOLN
1.0000 | Freq: Once | NASAL | Status: AC
  Administered 2013-12-27: 1 via NASAL
  Filled 2013-12-27: qty 15

## 2013-12-27 MED ORDER — BENZONATATE 100 MG PO CAPS: 100.0000 mg | ORAL_CAPSULE | Freq: Three times a day (TID) | ORAL | Status: DC

## 2013-12-27 MED ORDER — PREDNISONE 20 MG PO TABS
60.0000 mg | ORAL_TABLET | Freq: Once | ORAL | Status: AC
  Administered 2013-12-27: 60 mg via ORAL
  Filled 2013-12-27: qty 3

## 2013-12-27 MED ORDER — BENZONATATE 100 MG PO CAPS
200.0000 mg | ORAL_CAPSULE | Freq: Once | ORAL | Status: AC
  Administered 2013-12-27: 200 mg via ORAL
  Filled 2013-12-27: qty 2

## 2013-12-27 MED ORDER — OSELTAMIVIR PHOSPHATE 75 MG PO CAPS: 75.0000 mg | ORAL_CAPSULE | Freq: Two times a day (BID) | ORAL | Status: DC

## 2013-12-27 MED ORDER — ALBUTEROL SULFATE HFA 108 (90 BASE) MCG/ACT IN AERS
2.0000 | INHALATION_SPRAY | RESPIRATORY_TRACT | Status: DC | PRN
  Administered 2013-12-27: 2 via RESPIRATORY_TRACT
  Filled 2013-12-27: qty 6.7

## 2013-12-27 NOTE — ED Notes (Signed)
Pt states she does not feel good; Has been having HA and general all over body aches; pt states she has cramping in abdomen

## 2013-12-27 NOTE — Discharge Instructions (Signed)
Cool Mist Vaporizers °Vaporizers may help relieve the symptoms of a cough and cold. They add moisture to the air, which helps mucus to become thinner and less sticky. This makes it easier to breathe and cough up secretions. Cool mist vaporizers do not cause serious burns like hot mist vaporizers ("steamers, humidifiers"). Vaporizers have not been proved to show they help with colds. You should not use a vaporizer if you are allergic to mold.  °HOME CARE INSTRUCTIONS °· Follow the package instructions for the vaporizer. °· Do not use anything other than distilled water in the vaporizer. °· Do not run the vaporizer all of the time. This can cause mold or bacteria to grow in the vaporizer. °· Clean the vaporizer after each time it is used. °· Clean and dry the vaporizer well before storing it. °· Stop using the vaporizer if worsening respiratory symptoms develop. °Document Released: 07/19/2004 Document Revised: 06/24/2013 Document Reviewed: 03/11/2013 °ExitCare® Patient Information ©2014 [employer]. ° °Upper Respiratory Infection, Adult °An upper respiratory infection (URI) is also sometimes known as the common cold. The upper respiratory tract includes the nose, sinuses, throat, trachea, and bronchi. Bronchi are the airways leading to the lungs. Most people improve within 1 week, but symptoms can last up to 2 weeks. A residual cough may last even longer.  °CAUSES °Many different viruses can infect the tissues lining the upper respiratory tract. The tissues become irritated and inflamed and often become very moist. Mucus production is also common. A cold is contagious. You can easily spread the virus to others by oral contact. This includes kissing, sharing a glass, coughing, or sneezing. Touching your mouth or nose and then touching a surface, which is then touched by another person, can also spread the virus. °SYMPTOMS  °Symptoms typically develop 1 to 3 days after you come in contact with a cold virus. Symptoms  vary from person to person. They may include: °· Runny nose. °· Sneezing. °· Nasal congestion. °· Sinus irritation. °· Sore throat. °· Loss of voice (laryngitis). °· Cough. °· Fatigue. °· Muscle aches. °· Loss of appetite. °· Headache. °· Low-grade fever. °DIAGNOSIS  °You might diagnose your own cold based on familiar symptoms, since most people get a cold 2 to 3 times a year. Your caregiver can confirm this based on your exam. Most importantly, your caregiver can check that your symptoms are not due to another disease such as strep throat, sinusitis, pneumonia, asthma, or epiglottitis. Blood tests, throat tests, and X-rays are not necessary to diagnose a common cold, but they may sometimes be helpful in excluding other more serious diseases. Your caregiver will decide if any further tests are required. °RISKS AND COMPLICATIONS  °You may be at risk for a more severe case of the common cold if you smoke cigarettes, have chronic heart disease (such as heart failure) or lung disease (such as asthma), or if you have a weakened immune system. The very young and very old are also at risk for more serious infections. Bacterial sinusitis, middle ear infections, and bacterial pneumonia can complicate the common cold. The common cold can worsen asthma and chronic obstructive pulmonary disease (COPD). Sometimes, these complications can require emergency medical care and may be life-threatening. °PREVENTION  °The best way to protect against getting a cold is to practice good hygiene. Avoid oral or hand contact with people with cold symptoms. Wash your hands often if contact occurs. There is no clear evidence that vitamin C, vitamin E, echinacea, or exercise reduces the   chance of developing a cold. However, it is always recommended to get plenty of rest and practice good nutrition. °TREATMENT  °Treatment is directed at relieving symptoms. There is no cure. Antibiotics are not effective, because the infection is caused by a  virus, not by bacteria. Treatment may include: °· Increased fluid intake. Sports drinks offer valuable electrolytes, sugars, and fluids. °· Breathing heated mist or steam (vaporizer or shower). °· Eating chicken soup or other clear broths, and maintaining good nutrition. °· Getting plenty of rest. °· Using gargles or lozenges for comfort. °· Controlling fevers with ibuprofen or acetaminophen as directed by your caregiver. °· Increasing usage of your inhaler if you have asthma. °Zinc gel and zinc lozenges, taken in the first 24 hours of the common cold, can shorten the duration and lessen the severity of symptoms. Pain medicines may help with fever, muscle aches, and throat pain. A variety of non-prescription medicines are available to treat congestion and runny nose. Your caregiver can make recommendations and may suggest nasal or lung inhalers for other symptoms.  °HOME CARE INSTRUCTIONS  °· Only take over-the-counter or prescription medicines for pain, discomfort, or fever as directed by your caregiver. °· Use a warm mist humidifier or inhale steam from a shower to increase air moisture. This may keep secretions moist and make it easier to breathe. °· Drink enough water and fluids to keep your urine clear or pale yellow. °· Rest as needed. °· Return to work when your temperature has returned to normal or as your caregiver advises. You may need to stay home longer to avoid infecting others. You can also use a face mask and careful hand washing to prevent spread of the virus. °SEEK MEDICAL CARE IF:  °· After the first few days, you feel you are getting worse rather than better. °· You need your caregiver's advice about medicines to control symptoms. °· You develop chills, worsening shortness of breath, or brown or red sputum. These may be signs of pneumonia. °· You develop yellow or brown nasal discharge or pain in the face, especially when you bend forward. These may be signs of sinusitis. °· You develop a fever,  swollen neck glands, pain with swallowing, or white areas in the back of your throat. These may be signs of strep throat. °SEEK IMMEDIATE MEDICAL CARE IF:  °· You have a fever. °· You develop severe or persistent headache, ear pain, sinus pain, or chest pain. °· You develop wheezing, a prolonged cough, cough up blood, or have a change in your usual mucus (if you have chronic lung disease). °· You develop sore muscles or a stiff neck. °Document Released: 04/17/2001 Document Revised: 01/14/2012 Document Reviewed: 02/23/2011 °ExitCare® Patient Information ©2014 [employer]. ° °

## 2013-12-27 NOTE — ED Provider Notes (Signed)
CSN: [id number]     Arrival date & time 12/27/13  0547 History   First MD Initiated Contact with Patient 12/27/13 [phone]     Chief Complaint  Patient presents with  . Generalized Body Aches     (Consider location/radiation/quality/duration/timing/severity/associated sxs/prior Treatment) HPI History provided by patient. Diabetic with URI symptoms worse since yesterday. She complains of cough, congestion, headache and generalized body aches. Felt like she low-grade fever at home without recorded temperature. No chills.  has history of asthma and has been using her inhaler more frequently. No hemoptysis or productive sputum. No leg pain or leg swelling. No chest pain. Cough is keeping her up at night. No known sick contacts. Symptoms moderate in severity. Past Medical History  Diagnosis Date  . GERD (gastroesophageal reflux disease)   . Colon polyp   . Asthma   . CAD (coronary artery disease)   . Diabetes mellitus   . Hypertension   . Osteoarthritis   . Microcytosis   . Sleep disorder   . Obesity   . Depression 11/07    Hospitalization required  . Cataract   . Diverticulosis   . Esophageal stricture   . Hemorrhoids   . IBS (irritable bowel syndrome)    Past Surgical History  Procedure Laterality Date  . Abdominal hysterectomy    . Eye surgery    . Upper gastrointestinal endoscopy    . Colonoscopy    . Abdominal hysterectomy     Family History  Problem Relation Age of Onset  . Rectal cancer Sister   . Lung cancer Father   . Esophageal cancer Father   . Breast cancer Sister 46  . Breast cancer Mother 47  . Colon cancer Other     Aunt, Uncle  . Liver cancer Brother     Nephew  . Diabetes Sister   . Diabetes Brother   . Diabetes Other     son & daughter  . Heart disease Sister   . Heart disease Brother   . Kidney disease Sister   . Breast cancer Maternal Aunt     patient reports 11 mat aunts with breast cancer  . Breast cancer Paternal Aunt 85  . Breast cancer  Maternal Grandmother     unk age   History  Substance Use Topics  . Smoking status: Never Smoker   . Smokeless tobacco: Never Used  . Alcohol Use: No   OB History   Grav Para Term Preterm Abortions TAB SAB Ect Mult Living                 Review of Systems  Constitutional: Negative for chills and diaphoresis.  HENT: Positive for sore throat.   Respiratory: Positive for cough and wheezing.   Cardiovascular: Negative for chest pain.  Gastrointestinal: Negative for vomiting and abdominal pain.  Genitourinary: Negative for dysuria and flank pain.  Musculoskeletal: Negative for back pain, neck pain and neck stiffness.  Skin: Negative for rash.  Neurological: Negative for headaches.  All other systems reviewed and are negative.      Allergies  Latex and Percocet  Home Medications   Current Outpatient Rx  Name  Route  Sig  Dispense  Refill  . albuterol (PROVENTIL,VENTOLIN) 90 MCG/ACT inhaler   Inhalation   Inhale 2 puffs into the lungs every 6 (six) hours as needed for shortness of breath. Wheezing or shortness of breath         . amLODipine (NORVASC) 5 MG tablet   Oral  Take 1 tablet (5 mg total) by mouth daily.   30 tablet   0   . aspirin 81 MG tablet   Oral   Take 81 mg by mouth daily.           . budesonide-formoterol (SYMBICORT) 160-4.5 MCG/ACT inhaler   Inhalation   Inhale 2 puffs into the lungs 2 (two) times daily.   1 Inhaler   12   . dicyclomine (BENTYL) 10 MG capsule      Take 1 tab 3 times a day before meals.   90 capsule   1   . ergocalciferol (VITAMIN D2) 50000 UNITS capsule   Oral   Take 50,000 Units by mouth once a week. Usually takes on Saturday or fridays         . famotidine (PEPCID) 40 MG tablet      TAKE 1 TABLET EACH DAY   30 tablet   12   . fenofibrate (TRICOR) 145 MG tablet   Oral   Take 145 mg by mouth daily.           . ferrous sulfate 325 (65 FE) MG tablet   Oral   Take 325 mg by mouth daily with breakfast.            . fluticasone-salmeterol (ADVAIR HFA) 115-21 MCG/ACT inhaler   Inhalation   Inhale 2 puffs into the lungs as directed.           Marland Kitchen HYDROcodone-acetaminophen (VICODIN) 5-500 MG per tablet   Oral   Take 1 tablet by mouth every 6 (six) hours as needed.           . hyoscyamine (LEVBID) 0.375 MG 12 hr tablet   Oral   Take 1 tablet (0.375 mg total) by mouth every 12 (twelve) hours as needed.   60 tablet   1   . insulin NPH-insulin regular (NOVOLIN 70/30) (70-30) 100 UNIT/ML injection   Subcutaneous   Inject 60 Units into the skin 2 (two) times daily with a meal.          . Insulin Syringes, Disposable, U-100 1 ML MISC   Does not apply   by Does not apply route.           Marland Kitchen ipratropium (ATROVENT) 0.02 % nebulizer solution   Nebulization   Take 500 mcg by nebulization 3 (three) times daily as needed. wheezing         . Lancets MISC   Does not apply   by Does not apply route.           . loratadine (CLARITIN) 10 MG tablet   Oral   Take 10 mg by mouth daily.           . metFORMIN (GLUCOPHAGE) 500 MG tablet   Oral   Take 500 mg by mouth.         . metoprolol (LOPRESSOR) 50 MG tablet   Oral   Take 50 mg by mouth 2 (two) times daily.           . NON FORMULARY      T[employer]etrack test strip( Glucose Blood)          . potassium chloride (K-DUR) 10 MEQ tablet   Oral   Take 1 tablet (10 mEq total) by mouth 3 (three) times daily.   45 tablet   0   . promethazine (PHENERGAN) 25 MG tablet   Oral   Take 1 tablet (25 mg total) by mouth every  6 (six) hours as needed for nausea or vomiting.   30 tablet   0   . RABEprazole (ACIPHEX) 20 MG tablet   Oral   Take 20 mg by mouth 2 (two) times daily. Take 30 min before dinner. Lot # [id number] Exp date: 11/2012 Samples given.         . simvastatin (ZOCOR) 40 MG tablet   Oral   Take 40 mg by mouth at bedtime.           Marland Kitchen tobramycin (TOBREX) 0.3 % ophthalmic ointment   Both Eyes   Place 1 application into  both eyes 4 (four) times daily.          . traMADol (ULTRAM) 50 MG tablet   Oral   Take 1 tablet (50 mg total) by mouth every 6 (six) hours as needed.   15 tablet   0   . travoprost, benzalkonium, (TRAVATAN) 0.004 % ophthalmic solution   Both Eyes   Place 1 drop into both eyes at bedtime.            BP 178/119  Pulse 98  Temp(Src) 98.2 F (36.8 C) (Oral)  SpO2 93% Physical Exam  Nursing note and vitals reviewed. Constitutional: She is oriented to person, place, and time. She appears well-developed and well-nourished.  HENT:  Head: Normocephalic and atraumatic.  Mouth/Throat: Oropharynx is clear and moist. No oropharyngeal exudate.  Eyes: EOM are normal. Pupils are equal, round, and reactive to light.  Neck: Neck supple. No thyromegaly present.  Cardiovascular: Normal rate, regular rhythm and intact distal pulses.   Pulmonary/Chest: Effort normal. No stridor. No respiratory distress.  Mildly decreased bilateral breath sounds with some intermittent expiratory wheezes  Abdominal: Soft. There is no tenderness.  Musculoskeletal: Normal range of motion. She exhibits no edema and no tenderness.  Neurological: She is alert and oriented to person, place, and time. No cranial nerve deficit.  Skin: Skin is warm and dry.    ED Course  Procedures (including critical care time) Labs Review Labs Reviewed - No data to display Imaging Review No results found.  albuterol treatment, prednisone, Tessalon and Afrin provided  On recheck wheezes resolved  Plan discharge home with treatment for URI, likely viral. Given multiple comorbidities also started on Tamiflu for reported fevers. Plan close primary care followup. Return precautions provided.  MDM   Diagnosis: URI  Presents with cough, cold, congestion, sore throat, body aches  Medications provided, some subjective improvement in symptoms Stable for discharge home and outpatient followup Vital signs / nursing notes reviewed  and considered  Teressa Lower, MD 12/27/13 [phone]

## 2014-03-09 ENCOUNTER — Observation Stay (HOSPITAL_COMMUNITY)
Admission: EM | Admit: 2014-03-09 | Discharge: 2014-03-11 | Disposition: A | Discharge status: Home / Self Care | Payer: Medicaid Other | Attending: Cardiovascular Disease | Admitting: Cardiovascular Disease

## 2014-03-09 ENCOUNTER — Encounter (HOSPITAL_COMMUNITY): Payer: Self-pay | Admitting: Emergency Medicine

## 2014-03-09 ENCOUNTER — Emergency Department (HOSPITAL_COMMUNITY): Payer: Medicaid Other

## 2014-03-09 DIAGNOSIS — M1990 Unspecified osteoarthritis, unspecified site: Secondary | ICD-10-CM | POA: Insufficient documentation

## 2014-03-09 DIAGNOSIS — I2510 Atherosclerotic heart disease of native coronary artery without angina pectoris: Secondary | ICD-10-CM | POA: Insufficient documentation

## 2014-03-09 DIAGNOSIS — IMO0002 Reserved for concepts with insufficient information to code with codable children: Secondary | ICD-10-CM | POA: Insufficient documentation

## 2014-03-09 DIAGNOSIS — K219 Gastro-esophageal reflux disease without esophagitis: Secondary | ICD-10-CM | POA: Insufficient documentation

## 2014-03-09 DIAGNOSIS — Z7982 Long term (current) use of aspirin: Secondary | ICD-10-CM | POA: Insufficient documentation

## 2014-03-09 DIAGNOSIS — Z792 Long term (current) use of antibiotics: Secondary | ICD-10-CM | POA: Insufficient documentation

## 2014-03-09 DIAGNOSIS — R079 Chest pain, unspecified: Secondary | ICD-10-CM | POA: Diagnosis present

## 2014-03-09 DIAGNOSIS — Z91040 Latex allergy status: Secondary | ICD-10-CM | POA: Insufficient documentation

## 2014-03-09 DIAGNOSIS — J45909 Unspecified asthma, uncomplicated: Secondary | ICD-10-CM | POA: Insufficient documentation

## 2014-03-09 DIAGNOSIS — Z860100 Personal history of colon polyps, unspecified: Secondary | ICD-10-CM | POA: Insufficient documentation

## 2014-03-09 DIAGNOSIS — F329 Major depressive disorder, single episode, unspecified: Secondary | ICD-10-CM | POA: Insufficient documentation

## 2014-03-09 DIAGNOSIS — E669 Obesity, unspecified: Secondary | ICD-10-CM | POA: Insufficient documentation

## 2014-03-09 DIAGNOSIS — Z9861 Coronary angioplasty status: Secondary | ICD-10-CM | POA: Insufficient documentation

## 2014-03-09 DIAGNOSIS — M79609 Pain in unspecified limb: Secondary | ICD-10-CM

## 2014-03-09 DIAGNOSIS — Z794 Long term (current) use of insulin: Secondary | ICD-10-CM | POA: Insufficient documentation

## 2014-03-09 DIAGNOSIS — M79604 Pain in right leg: Secondary | ICD-10-CM

## 2014-03-09 DIAGNOSIS — F3289 Other specified depressive episodes: Secondary | ICD-10-CM | POA: Insufficient documentation

## 2014-03-09 DIAGNOSIS — Z8669 Personal history of other diseases of the nervous system and sense organs: Secondary | ICD-10-CM | POA: Insufficient documentation

## 2014-03-09 DIAGNOSIS — I10 Essential (primary) hypertension: Secondary | ICD-10-CM | POA: Insufficient documentation

## 2014-03-09 DIAGNOSIS — R0602 Shortness of breath: Secondary | ICD-10-CM | POA: Insufficient documentation

## 2014-03-09 DIAGNOSIS — Z862 Personal history of diseases of the blood and blood-forming organs and certain disorders involving the immune mechanism: Secondary | ICD-10-CM | POA: Insufficient documentation

## 2014-03-09 DIAGNOSIS — I209 Angina pectoris, unspecified: Principal | ICD-10-CM | POA: Insufficient documentation

## 2014-03-09 DIAGNOSIS — Z791 Long term (current) use of non-steroidal anti-inflammatories (NSAID): Secondary | ICD-10-CM | POA: Insufficient documentation

## 2014-03-09 DIAGNOSIS — R51 Headache: Secondary | ICD-10-CM | POA: Insufficient documentation

## 2014-03-09 DIAGNOSIS — E119 Type 2 diabetes mellitus without complications: Secondary | ICD-10-CM | POA: Insufficient documentation

## 2014-03-09 DIAGNOSIS — Z79899 Other long term (current) drug therapy: Secondary | ICD-10-CM | POA: Insufficient documentation

## 2014-03-09 LAB — COMPREHENSIVE METABOLIC PANEL
ALBUMIN: 3.8 g/dL (ref 3.5–5.2)
ALT: 18 U/L (ref 0–35)
AST: 12 U/L (ref 0–37)
Alkaline Phosphatase: 53 U/L (ref 39–117)
BILIRUBIN TOTAL: 0.2 mg/dL — AB (ref 0.3–1.2)
BUN: 8 mg/dL (ref 6–23)
CHLORIDE: 105 meq/L (ref 96–112)
CO2: 26 mEq/L (ref 19–32)
CREATININE: 0.7 mg/dL (ref 0.50–1.10)
Calcium: 8.6 mg/dL (ref 8.4–10.5)
GFR calc Af Amer: >90 mL/min (ref >90)
GFR calc non Af Amer: >90 mL/min (ref >90)
Glucose, Bld: 69 mg/dL — ABNORMAL LOW (ref 70–99)
Potassium: 3.5 mEq/L — ABNORMAL LOW (ref 3.7–5.3)
SODIUM: 145 meq/L (ref 137–147)
Total Protein: 7.5 g/dL (ref 6.0–8.3)

## 2014-03-09 LAB — CBC
HCT: 32.4 % — ABNORMAL LOW (ref 36.0–46.0)
HEMOGLOBIN: 10.2 g/dL — AB (ref 12.0–15.0)
MCH: 22.1 pg — AB (ref 26.0–34.0)
MCHC: 31.5 g/dL (ref 30.0–36.0)
MCV: 70.1 fL — ABNORMAL LOW (ref 78.0–100.0)
PLATELETS: 227 10*3/uL (ref 150–400)
RBC: 4.62 MIL/uL (ref 3.87–5.11)
RDW: 18.6 % — ABNORMAL HIGH (ref 11.5–15.5)
WBC: 6.6 10*3/uL (ref 4.0–10.5)

## 2014-03-09 LAB — PRO B NATRIURETIC PEPTIDE: PRO B NATRI PEPTIDE: 5.5 pg/mL (ref 0–125)

## 2014-03-09 LAB — CBG MONITORING, ED
Glucose-Capillary: 74 mg/dL (ref 70–99)
Glucose-Capillary: 113 mg/dL — ABNORMAL HIGH (ref 70–99)

## 2014-03-09 LAB — HEMOGLOBIN A1C
Hgb A1c MFr Bld: 7.5 % — ABNORMAL HIGH (ref <5.7)
MEAN PLASMA GLUCOSE: 169 mg/dL — AB (ref <117)

## 2014-03-09 LAB — MRSA PCR SCREENING: MRSA by PCR: NEGATIVE

## 2014-03-09 LAB — GLUCOSE, CAPILLARY
GLUCOSE-CAPILLARY: 114 mg/dL — AB (ref 70–99)
Glucose-Capillary: 168 mg/dL — ABNORMAL HIGH (ref 70–99)

## 2014-03-09 LAB — TROPONIN I
Troponin I: <0.3 ng/mL (ref <0.30)
Troponin I: <0.3 ng/mL (ref <0.30)

## 2014-03-09 LAB — I-STAT TROPONIN, ED: Troponin i, poc: 0 ng/mL (ref 0.00–0.08)

## 2014-03-09 LAB — HEPARIN LEVEL (UNFRACTIONATED): Heparin Unfractionated: <0.1 IU/mL — ABNORMAL LOW (ref 0.30–0.70)

## 2014-03-09 MED ORDER — INSULIN ASPART PROT & ASPART (70-30 MIX) 100 UNIT/ML ~~LOC~~ SUSP
65.0000 [IU] | Freq: Two times a day (BID) | SUBCUTANEOUS | Status: DC
  Filled 2014-03-09: qty 10

## 2014-03-09 MED ORDER — ALBUTEROL 90 MCG/ACT IN AERS: 2.0000 | INHALATION_SPRAY | Freq: Four times a day (QID) | RESPIRATORY_TRACT | Status: DC | PRN

## 2014-03-09 MED ORDER — SODIUM CHLORIDE 0.9 % IJ SOLN: 3.0000 mL | INTRAMUSCULAR | Status: DC | PRN

## 2014-03-09 MED ORDER — HYOSCYAMINE SULFATE ER 0.375 MG PO TB12
0.3750 mg | ORAL_TABLET | Freq: Two times a day (BID) | ORAL | Status: DC | PRN
  Filled 2014-03-09: qty 1

## 2014-03-09 MED ORDER — METFORMIN HCL 500 MG PO TABS
500.0000 mg | ORAL_TABLET | Freq: Every day | ORAL | Status: DC
  Filled 2014-03-09 (×2): qty 1

## 2014-03-09 MED ORDER — FENOFIBRATE 160 MG PO TABS
160.0000 mg | ORAL_TABLET | Freq: Every day | ORAL | Status: DC
  Administered 2014-03-09 – 2014-03-11 (×3): 160 mg via ORAL
  Filled 2014-03-09 (×3): qty 1

## 2014-03-09 MED ORDER — FERROUS SULFATE 325 (65 FE) MG PO TABS
325.0000 mg | ORAL_TABLET | Freq: Every day | ORAL | Status: DC
  Administered 2014-03-10 – 2014-03-11 (×2): 325 mg via ORAL
  Filled 2014-03-09 (×3): qty 1

## 2014-03-09 MED ORDER — HEPARIN (PORCINE) IN NACL 100-0.45 UNIT/ML-% IJ SOLN
2200.0000 [IU]/h | INTRAMUSCULAR | Status: DC
  Administered 2014-03-09: 2000 [IU]/h via INTRAVENOUS
  Administered 2014-03-09: 2400 [IU]/h via INTRAVENOUS
  Filled 2014-03-09 (×4): qty 250

## 2014-03-09 MED ORDER — AMLODIPINE BESYLATE 5 MG PO TABS
5.0000 mg | ORAL_TABLET | Freq: Every day | ORAL | Status: DC
  Administered 2014-03-09 – 2014-03-11 (×3): 5 mg via ORAL
  Filled 2014-03-09 (×3): qty 1

## 2014-03-09 MED ORDER — ALBUTEROL SULFATE (2.5 MG/3ML) 0.083% IN NEBU: 2.5000 mg | INHALATION_SOLUTION | Freq: Four times a day (QID) | RESPIRATORY_TRACT | Status: DC | PRN

## 2014-03-09 MED ORDER — INSULIN ASPART 100 UNIT/ML ~~LOC~~ SOLN
6.0000 [IU] | Freq: Three times a day (TID) | SUBCUTANEOUS | Status: DC
  Administered 2014-03-11: 6 [IU] via SUBCUTANEOUS

## 2014-03-09 MED ORDER — ASPIRIN EC 81 MG PO TBEC
81.0000 mg | DELAYED_RELEASE_TABLET | Freq: Every day | ORAL | Status: DC
  Administered 2014-03-10 – 2014-03-11 (×2): 81 mg via ORAL
  Filled 2014-03-09 (×4): qty 1

## 2014-03-09 MED ORDER — MOMETASONE FURO-FORMOTEROL FUM 100-5 MCG/ACT IN AERO: 2.0000 | INHALATION_SPRAY | Freq: Two times a day (BID) | RESPIRATORY_TRACT | Status: DC

## 2014-03-09 MED ORDER — LORATADINE 10 MG PO TABS
10.0000 mg | ORAL_TABLET | Freq: Every day | ORAL | Status: DC
  Administered 2014-03-09 – 2014-03-11 (×3): 10 mg via ORAL
  Filled 2014-03-09 (×3): qty 1

## 2014-03-09 MED ORDER — HEPARIN BOLUS VIA INFUSION
4000.0000 [IU] | Freq: Once | INTRAVENOUS | Status: AC
  Administered 2014-03-09: 4000 [IU] via INTRAVENOUS
  Filled 2014-03-09: qty 4000

## 2014-03-09 MED ORDER — METOPROLOL TARTRATE 50 MG PO TABS
50.0000 mg | ORAL_TABLET | Freq: Two times a day (BID) | ORAL | Status: DC
  Administered 2014-03-09 – 2014-03-11 (×5): 50 mg via ORAL
  Filled 2014-03-09 (×4): qty 1
  Filled 2014-03-09: qty 2
  Filled 2014-03-09: qty 1

## 2014-03-09 MED ORDER — POTASSIUM CHLORIDE ER 10 MEQ PO TBCR
10.0000 meq | EXTENDED_RELEASE_TABLET | Freq: Three times a day (TID) | ORAL | Status: DC
  Administered 2014-03-09 – 2014-03-11 (×7): 10 meq via ORAL
  Filled 2014-03-09 (×9): qty 1

## 2014-03-09 MED ORDER — ALPRAZOLAM 0.25 MG PO TABS
0.2500 mg | ORAL_TABLET | Freq: Three times a day (TID) | ORAL | Status: DC | PRN
Start: 2014-03-09 — End: 2014-03-11

## 2014-03-09 MED ORDER — SODIUM CHLORIDE 0.9 % IJ SOLN
3.0000 mL | Freq: Two times a day (BID) | INTRAMUSCULAR | Status: DC
  Administered 2014-03-09 – 2014-03-10 (×2): 3 mL via INTRAVENOUS

## 2014-03-09 MED ORDER — ASPIRIN 81 MG PO CHEW
324.0000 mg | CHEWABLE_TABLET | ORAL | Status: AC
  Administered 2014-03-09: 324 mg via ORAL
  Filled 2014-03-09: qty 4

## 2014-03-09 MED ORDER — FAMOTIDINE 40 MG PO TABS
40.0000 mg | ORAL_TABLET | Freq: Every day | ORAL | Status: DC
  Administered 2014-03-09 – 2014-03-11 (×3): 40 mg via ORAL
  Filled 2014-03-09 (×2): qty 1
  Filled 2014-03-09: qty 2

## 2014-03-09 MED ORDER — NITROGLYCERIN 0.4 MG SL SUBL
0.4000 mg | SUBLINGUAL_TABLET | SUBLINGUAL | Status: DC | PRN
  Administered 2014-03-09 – 2014-03-10 (×3): 0.4 mg via SUBLINGUAL
  Filled 2014-03-09: qty 1

## 2014-03-09 MED ORDER — POTASSIUM CHLORIDE CRYS ER 20 MEQ PO TBCR
20.0000 meq | EXTENDED_RELEASE_TABLET | Freq: Once | ORAL | Status: AC
  Administered 2014-03-09: 20 meq via ORAL
  Filled 2014-03-09: qty 1

## 2014-03-09 MED ORDER — FUROSEMIDE 10 MG/ML IJ SOLN
40.0000 mg | Freq: Once | INTRAMUSCULAR | Status: AC
  Administered 2014-03-09: 40 mg via INTRAVENOUS
  Filled 2014-03-09: qty 4

## 2014-03-09 MED ORDER — BUDESONIDE-FORMOTEROL FUMARATE 160-4.5 MCG/ACT IN AERO
2.0000 | INHALATION_SPRAY | Freq: Two times a day (BID) | RESPIRATORY_TRACT | Status: DC
  Administered 2014-03-09 – 2014-03-11 (×5): 2 via RESPIRATORY_TRACT
  Filled 2014-03-09: qty 6

## 2014-03-09 MED ORDER — TRAVOPROST (BAK FREE) 0.004 % OP SOLN
1.0000 [drp] | Freq: Every day | OPHTHALMIC | Status: DC
  Administered 2014-03-09 – 2014-03-10 (×2): 1 [drp] via OPHTHALMIC
  Filled 2014-03-09: qty 2.5

## 2014-03-09 MED ORDER — ASPIRIN 300 MG RE SUPP: 300.0000 mg | RECTAL | Status: AC

## 2014-03-09 MED ORDER — INSULIN ASPART 100 UNIT/ML ~~LOC~~ SOLN
0.0000 [IU] | Freq: Three times a day (TID) | SUBCUTANEOUS | Status: DC
  Administered 2014-03-10 – 2014-03-11 (×2): 4 [IU] via SUBCUTANEOUS

## 2014-03-09 MED ORDER — IPRATROPIUM BROMIDE 0.02 % IN SOLN: 0.5000 mg | Freq: Three times a day (TID) | RESPIRATORY_TRACT | Status: DC | PRN

## 2014-03-09 MED ORDER — ONDANSETRON HCL 4 MG/2ML IJ SOLN: 4.0000 mg | Freq: Four times a day (QID) | INTRAMUSCULAR | Status: DC | PRN

## 2014-03-09 MED ORDER — HYDROCODONE-ACETAMINOPHEN 5-325 MG PO TABS
1.0000 | ORAL_TABLET | Freq: Four times a day (QID) | ORAL | Status: DC | PRN
Start: 2014-03-09 — End: 2014-03-11
  Administered 2014-03-09 – 2014-03-11 (×6): 1 via ORAL
  Filled 2014-03-09 (×6): qty 1

## 2014-03-09 MED ORDER — SODIUM CHLORIDE 0.9 % IV SOLN
250.0000 mL | INTRAVENOUS | Status: DC | PRN
  Administered 2014-03-09: 250 mL via INTRAVENOUS

## 2014-03-09 MED ORDER — HEPARIN BOLUS VIA INFUSION
3000.0000 [IU] | Freq: Once | INTRAVENOUS | Status: AC
  Administered 2014-03-09: 3000 [IU] via INTRAVENOUS
  Filled 2014-03-09: qty 3000

## 2014-03-09 MED ORDER — HYDROMORPHONE HCL PF 1 MG/ML IJ SOLN
1.0000 mg | Freq: Once | INTRAMUSCULAR | Status: AC
Start: 2014-03-09 — End: 2014-03-09
  Administered 2014-03-09: 1 mg via INTRAVENOUS
  Filled 2014-03-09: qty 1

## 2014-03-09 NOTE — Progress Notes (Signed)
ANTICOAGULATION CONSULT NOTE - Initial Consult  Pharmacy Consult for Heparin Indication: chest pain/ACS  Allergies  Allergen Reactions  . Latex Rash    unknown  . Percocet [Oxycodone-Acetaminophen] Itching         Patient Measurements: Height: 5\' 7"  (170.2 cm) Weight: 340 lb (154.223 kg) IBW/kg (Calculated) : 61.6 Heparin Dosing Weight: 103  Vital Signs: Temp: 97.7 F (36.5 C) (05/05 1417) Temp src: Oral (05/05 1417) BP: 122/63 mmHg (05/05 1417) Pulse Rate: 72 (05/05 1417)  Labs:  Recent Labs  03/09/14 0645 03/09/14 1245  HGB 10.2*  --   HCT 32.4*  --   PLT 227  --   CREATININE 0.70  --   TROPONINI  --  <0.30    Estimated Creatinine Clearance: 119.3 ml/min (by C-G formula based on Cr of 0.7).   Medical History: Past Medical History  Diagnosis Date  . GERD (gastroesophageal reflux disease)   . Colon polyp   . Asthma   . CAD (coronary artery disease)   . Diabetes mellitus   . Hypertension   . Osteoarthritis   . Microcytosis   . Sleep disorder   . Obesity   . Depression 11/07    Hospitalization required  . Cataract   . Diverticulosis   . Esophageal stricture   . Hemorrhoids   . IBS (irritable bowel syndrome)    Assessment: 58 year old female to begin heparin for rule out MI.  Goal of Therapy:  Heparin level 0.3-0.7 units/ml Monitor platelets by anticoagulation protocol: Yes   Plan:  1) Heparin 4000 units iv bolus x 1 2) Heparin drip at 2000 units / hr 3) Heparin level 6 hours after heparin begins 4) Daily heparin level, CBC  Thank you. Anette Guarneri, PharmD [phone]  03/09/2014,2:58 PM

## 2014-03-09 NOTE — ED Notes (Signed)
Pt called out and states she is experiencing chest pain.

## 2014-03-09 NOTE — ED Notes (Signed)
Pt. reports right leg pain and right side body aches onset 6 days ago , denies injury. Respirations unlabored .

## 2014-03-09 NOTE — ED Notes (Signed)
Attempted to call report

## 2014-03-09 NOTE — Progress Notes (Signed)
Patient complained of right arm numbness to IV team nurse.  I notified Dr. Doylene Canard, he stated to continue cardiac workup and give ordered iv lasix and check the patient's response to the medication, he also stated to hold off on stroke work up for now.  Lorenda Hatchet, RN (oncoming night shift RN) notified.

## 2014-03-09 NOTE — ED Notes (Signed)
Cardiology MD at the bedside.

## 2014-03-09 NOTE — Progress Notes (Signed)
[place] for Heparin Indication: chest pain/ACS  Allergies  Allergen Reactions  . Latex Rash    unknown  . Percocet [Oxycodone-Acetaminophen] Itching         Patient Measurements: Height: 5\' 7"  (170.2 cm) Weight: 340 lb (154.223 kg) IBW/kg (Calculated) : 61.6 Heparin Dosing Weight: 103  Vital Signs: Temp: 98.8 F (37.1 C) (05/05 2100) Temp src: Oral (05/05 2100) BP: 151/91 mmHg (05/05 2257) Pulse Rate: 82 (05/05 2257)  Labs:  Recent Labs  03/09/14 0645 03/09/14 1245 03/09/14 2125  HGB 10.2*  --   --   HCT 32.4*  --   --   PLT 227  --   --   HEPARINUNFRC  --   --  <0.10*  CREATININE 0.70  --   --   TROPONINI  --  <0.30 <0.30    Estimated Creatinine Clearance: 119.3 ml/min (by C-G formula based on Cr of 0.7).   Assessment: 58 y.o. female with chest pain for heparin   Goal of Therapy:  Heparin level 0.3-0.7 units/ml Monitor platelets by anticoagulation protocol: Yes   Plan:  Heparin 3000 units IV bolus, then increase heparin 2400 units/hr Follow-up am labs.  Phillis Knack, PharmD, BCPS

## 2014-03-09 NOTE — H&P (Signed)
Referring Physician:  ADDYSON Lester is an 58 y.o. female.                       Chief Complaint: Chest pain, leg pain and body pain. HPI: 58 year old black female presented with substernal chest pain of 1-day duration. Chest pain was increased with breathing. She also has right sided body ache and leg pain and swelling. Her vascular study of leg was negative for DVT. The patient mentioned chest pain radiated to the left arm and had some shortness of breath.   Past Medical History  Diagnosis Date  . GERD (gastroesophageal reflux disease)   . Colon polyp   . Asthma   . CAD (coronary artery disease)   . Diabetes mellitus   . Hypertension   . Osteoarthritis   . Microcytosis   . Sleep disorder   . Obesity   . Depression 11/07    Hospitalization required  . Cataract   . Diverticulosis   . Esophageal stricture   . Hemorrhoids   . IBS (irritable bowel syndrome)       Past Surgical History  Procedure Laterality Date  . Abdominal hysterectomy    . Eye surgery    . Upper gastrointestinal endoscopy    . Colonoscopy    . Abdominal hysterectomy      Family History  Problem Relation Age of Onset  . Rectal cancer Sister   . Lung cancer Father   . Esophageal cancer Father   . Breast cancer Sister 18  . Breast cancer Mother 13  . Colon cancer Other     Aunt, Uncle  . Liver cancer Brother     Nephew  . Diabetes Sister   . Diabetes Brother   . Diabetes Other     son & daughter  . Heart disease Sister   . Heart disease Brother   . Kidney disease Sister   . Breast cancer Maternal Aunt     patient reports 11 mat aunts with breast cancer  . Breast cancer Paternal Aunt 85  . Breast cancer Maternal Grandmother     unk age   Social History:  reports that she has never smoked. She has never used smokeless tobacco. She reports that she does not drink alcohol or use illicit drugs.  Allergies:  Allergies  Allergen Reactions  . Latex Rash    unknown  . Percocet  [Oxycodone-Acetaminophen] Itching          (Not in a hospital admission)  Results for orders placed during the hospital encounter of 03/09/14 (from the past 48 hour(s))  I-STAT TROPOININ, ED     Status: None   Collection Time    03/09/14  6:29 AM      Result Value Ref Range   Troponin i, poc 0.00  0.00 - 0.08 ng/mL   Comment 3            Comment: Due to the release kinetics of cTnI,     a negative result within the first hours     of the onset of symptoms does not rule out     myocardial infarction with certainty.     If myocardial infarction is still suspected,     repeat the test at appropriate intervals.  CBC     Status: Abnormal   Collection Time    03/09/14  6:45 AM      Result Value Ref Range   WBC 6.6  4.0 - 10.5 K/uL  RBC 4.62  3.87 - 5.11 MIL/uL   Hemoglobin 10.2 (*) 12.0 - 15.0 g/dL   HCT 32.4 (*) 36.0 - 46.0 %   MCV 70.1 (*) 78.0 - 100.0 fL   MCH 22.1 (*) 26.0 - 34.0 pg   MCHC 31.5  30.0 - 36.0 g/dL   RDW 18.6 (*) 11.5 - 15.5 %   Platelets 227  150 - 400 K/uL  PRO B NATRIURETIC PEPTIDE     Status: None   Collection Time    03/09/14  6:45 AM      Result Value Ref Range   Pro B Natriuretic peptide (BNP) 5.5  0 - 125 pg/mL  COMPREHENSIVE METABOLIC PANEL     Status: Abnormal   Collection Time    03/09/14  6:45 AM      Result Value Ref Range   Sodium 145  137 - 147 mEq/L   Potassium 3.5 (*) 3.7 - 5.3 mEq/L   Chloride 105  96 - 112 mEq/L   CO2 26  19 - 32 mEq/L   Glucose, Bld 69 (*) 70 - 99 mg/dL   BUN 8  6 - 23 mg/dL   Creatinine, Ser 0.70  0.50 - 1.10 mg/dL   Calcium 8.6  8.4 - 10.5 mg/dL   Total Protein 7.5  6.0 - 8.3 g/dL   Albumin 3.8  3.5 - 5.2 g/dL   AST 12  0 - 37 U/L   ALT 18  0 - 35 U/L   Alkaline Phosphatase 53  39 - 117 U/L   Total Bilirubin 0.2 (*) 0.3 - 1.2 mg/dL   GFR calc non Af Amer >90  >90 mL/min   GFR calc Af Amer >90  >90 mL/min   Comment: (NOTE)     The eGFR has been calculated using the CKD EPI equation.     This calculation has  not been validated in all clinical situations.     eGFR's persistently <90 mL/min signify possible Chronic Kidney     Disease.   Dg Chest 2 View  03/09/2014   CLINICAL DATA:  Mid chest pain, occasional short of breath  EXAM: CHEST  2 VIEW  COMPARISON:  Chest radiograph 12/15/2013  FINDINGS: Normal cardiac silhouette. There are low lung volumes. Mild central venous pulmonary congestion. No focal infiltrate. No pneumothorax Degenerative osteophytosis of the thoracic spine.  IMPRESSION: 1. Low lung volumes. 2. Mild venous pulmonary congestion.   Electronically Signed   By: Suzy Bouchard M.D.   On: 03/09/2014 07:28    Review Of Systems Constitutional: Negative for fever and chills.  Respiratory: Positive for shortness of breath and wheezing. Negative for cough.  Cardiovascular: Positive for chest pain, palpitations and leg swelling.  Gastrointestinal: Positive for nausea, vomiting, abdominal pain and diarrhea.  Musculoskeletal: Positive for back pain, neck pain and leg pain.  Skin: Negative for rash and wound.  Neurological: Positive for dizziness, weakness, light-headedness, numbness and headaches.    Blood pressure 153/85, pulse 78, temperature 97.6 F (36.4 C), temperature source Oral, resp. rate 24, height _0  (1.702 m), weight 154.223 kg (340 lb), SpO2 98.00%.  GENERAL: The patient is well-built, well-nourished female in no acute distress.  HEENT: The patient is normocephalic, atraumatic with brown eyes. Conjunctivae pink. Sclerae are nonicteric.  NECK: Supple.  LUNGS: Clear bilaterally. Chest wall tender on palpation of the left precordial area.  HEART: Normal S1 and S2. No murmur, gallop, or rub.  ABDOMEN: Soft and nontender, distended.  EXTREMITIES: Trace edema and  tender on palpation. No cyanosis or clubbing.  NEUROLOGICALLY: The patient is alert and oriented x 3. Cranial nerves II-XII grossly intact. The patient moves all 4 extremities.   Assessment/Plan Chest  pain Hypertension DM, II Asthma Gout Morbid Obesity Glaucoma Fibromyalgia Anxiety  Place in observation. R/O MI. Nuclear stress test can not be done due to weight limit.  [name] 03/09/2014, 10:19 AM

## 2014-03-09 NOTE — ED Notes (Signed)
Dr. Nanavati at the bedside.  

## 2014-03-09 NOTE — Progress Notes (Addendum)
*  Preliminary Results* Right lower extremity venous duplex completed. Visualized veins of the right lower extremity is negative for deep vein thrombosis. There is no evidence of right Baker's cyst.  03/09/2014 9:07 AM  Maudry Mayhew, RVT, RDCS, RDMS

## 2014-03-10 LAB — BASIC METABOLIC PANEL
BUN: 9 mg/dL (ref 6–23)
CALCIUM: 8.2 mg/dL — AB (ref 8.4–10.5)
CHLORIDE: 101 meq/L (ref 96–112)
CO2: 28 meq/L (ref 19–32)
CREATININE: 0.83 mg/dL (ref 0.50–1.10)
GFR calc Af Amer: 88 mL/min — ABNORMAL LOW (ref >90)
GFR calc non Af Amer: 76 mL/min — ABNORMAL LOW (ref >90)
Glucose, Bld: 194 mg/dL — ABNORMAL HIGH (ref 70–99)
Potassium: 3.8 mEq/L (ref 3.7–5.3)
Sodium: 143 mEq/L (ref 137–147)

## 2014-03-10 LAB — TROPONIN I: Troponin I: <0.3 ng/mL (ref <0.30)

## 2014-03-10 LAB — CBC
HCT: 32.7 % — ABNORMAL LOW (ref 36.0–46.0)
Hemoglobin: 10.2 g/dL — ABNORMAL LOW (ref 12.0–15.0)
MCH: 21.9 pg — AB (ref 26.0–34.0)
MCHC: 31.2 g/dL (ref 30.0–36.0)
MCV: 70.3 fL — ABNORMAL LOW (ref 78.0–100.0)
PLATELETS: 229 10*3/uL (ref 150–400)
RBC: 4.65 MIL/uL (ref 3.87–5.11)
RDW: 18.4 % — ABNORMAL HIGH (ref 11.5–15.5)
WBC: 5.7 10*3/uL (ref 4.0–10.5)

## 2014-03-10 LAB — LIPID PANEL
CHOLESTEROL: 172 mg/dL (ref 0–200)
HDL: 51 mg/dL (ref >39)
LDL CALC: 95 mg/dL (ref 0–99)
TRIGLYCERIDES: 132 mg/dL (ref <150)
Total CHOL/HDL Ratio: 3.4 RATIO
VLDL: 26 mg/dL (ref 0–40)

## 2014-03-10 LAB — HEPARIN LEVEL (UNFRACTIONATED)
HEPARIN UNFRACTIONATED: 1.28 [IU]/mL — AB (ref 0.30–0.70)
Heparin Unfractionated: 0.97 IU/mL — ABNORMAL HIGH (ref 0.30–0.70)

## 2014-03-10 LAB — GLUCOSE, CAPILLARY
Glucose-Capillary: 138 mg/dL — ABNORMAL HIGH (ref 70–99)
Glucose-Capillary: 212 mg/dL — ABNORMAL HIGH (ref 70–99)
Glucose-Capillary: 160 mg/dL — ABNORMAL HIGH (ref 70–99)
Glucose-Capillary: 96 mg/dL (ref 70–99)
Glucose-Capillary: 159 mg/dL — ABNORMAL HIGH (ref 70–99)

## 2014-03-10 MED ORDER — BISACODYL 10 MG RE SUPP
10.0000 mg | Freq: Every day | RECTAL | Status: DC | PRN
  Administered 2014-03-10: 10 mg via RECTAL
  Filled 2014-03-10: qty 1

## 2014-03-10 MED ORDER — METFORMIN HCL 500 MG PO TABS
500.0000 mg | ORAL_TABLET | Freq: Two times a day (BID) | ORAL | Status: DC
  Administered 2014-03-10 – 2014-03-11 (×2): 500 mg via ORAL
  Filled 2014-03-10 (×5): qty 1

## 2014-03-10 MED ORDER — HEPARIN (PORCINE) IN NACL 100-0.45 UNIT/ML-% IJ SOLN
1950.0000 [IU]/h | INTRAMUSCULAR | Status: DC
  Administered 2014-03-10: 1950 [IU]/h via INTRAVENOUS
  Administered 2014-03-10: 2200 [IU]/h via INTRAVENOUS
  Administered 2014-03-11: 1950 [IU]/h via INTRAVENOUS
  Filled 2014-03-10 (×5): qty 250

## 2014-03-10 MED ORDER — INSULIN ASPART PROT & ASPART (70-30 MIX) 100 UNIT/ML ~~LOC~~ SUSP
30.0000 [IU] | Freq: Two times a day (BID) | SUBCUTANEOUS | Status: DC
  Administered 2014-03-10 – 2014-03-11 (×2): 30 [IU] via SUBCUTANEOUS

## 2014-03-10 NOTE — Care Management Note (Signed)
    Page 1 of 1   03/10/2014     8:51:46 AM CARE MANAGEMENT NOTE 03/10/2014  Patient:  Chelsea Lester, Chelsea Lester   Account Number:  [id number]  Date Initiated:  03/10/2014  Documentation initiated by:  GRAVES-BIGELOW,Kamren Heskett  Subjective/Objective Assessment:   Pt admitted for chest pain.     Action/Plan:   CM received referral for medication assistance. Pt has Medicaid. CM unable to assist with meds. Pt's meds should not be any more than 3.00 at max. No further needs from CM at this time.   Anticipated DC Date:  03/11/2014   Anticipated DC Plan:  [place]  CM consult      Choice offered to / List presented to:             Status of service:  Completed, signed off Medicare Important Message given?   (If response is "NO", the following Medicare IM given date fields will be blank) Date Medicare IM given:   Date Additional Medicare IM given:    Discharge Disposition:  HOME/SELF CARE  Per UR Regulation:  Reviewed for med. necessity/level of care/duration of stay  If discussed at [place] of Stay Meetings, dates discussed:    Comments:

## 2014-03-10 NOTE — Progress Notes (Signed)
Patient C/O 10/10 CP.  Pain decreased to 9/10 with 2 SL nitro.  VSS.  EKG obtained and DR. [place] notified.  Will continue to monitor. [place]

## 2014-03-10 NOTE — Progress Notes (Signed)
[place] for Heparin Indication: chest pain/ACS  Allergies  Allergen Reactions  . Latex Rash    unknown  . Percocet [Oxycodone-Acetaminophen] Itching         Patient Measurements: Height: 5\' 7"  (170.2 cm) Weight: 340 lb (154.223 kg) IBW/kg (Calculated) : 61.6 Heparin Dosing Weight: 103  Vital Signs: Temp: 97.8 F (36.6 C) (05/06 0734) Temp src: Oral (05/06 0734) BP: 153/77 mmHg (05/06 0734) Pulse Rate: 95 (05/06 0734)  Labs:  Recent Labs  03/09/14 0645 03/09/14 1245 03/09/14 2125 03/10/14 0045 03/10/14 0630  HGB 10.2*  --   --   --   --   HCT 32.4*  --   --   --   --   PLT 227  --   --   --   --   HEPARINUNFRC  --   --  <0.10*  --  1.28*  CREATININE 0.70  --   --  0.83  --   TROPONINI  --  <0.30 <0.30 <0.30  --     Estimated Creatinine Clearance: 115 ml/min (by C-G formula based on Cr of 0.83).   Assessment: 58 y.o. female with chest pain on heparin drip. Heparin level this AM was supratherapeutic at 1.28. US duplex is negative for DVT. Hgb 10.2, Plt wnl.   Goal of Therapy:  Heparin level 0.3-0.7 units/ml Monitor platelets by anticoagulation protocol: Yes   Plan:  1) Stop heparin drip for 1 hour, then restart at decreased rate of 1200 units/hr  2) Monitor daily CBC, HL and s/s bleeding  3) F/u [place], PharmD.  Clinical Pharmacist Pager [phone]

## 2014-03-10 NOTE — Progress Notes (Signed)
Inpatient Diabetes Program Recommendations  AACE/ADA: New Consensus Statement on Inpatient Glycemic Control (2013)  Target Ranges:  Prepandial:   less than 140 mg/dL      Peak postprandial:   less than 180 mg/dL (1-2 hours)      Critically ill patients:  140 - 180 mg/dL     Results for Chelsea Lester, Chelsea Lester (MRN [id number]) as of 03/10/2014 09:27  Ref. Range 03/09/2014 12:08 03/09/2014 16:57 03/09/2014 22:19  Glucose-Capillary Latest Range: 70-99 mg/dL 113 (H) 114 (H) 168 (H)    Results for Chelsea Lester, Chelsea Lester (MRN [id number]) as of 03/10/2014 09:27  Ref. Range 03/09/2014 13:00  Hemoglobin A1C Latest Range: <5.7 % 7.5 (H)    Patient admitted with CP.  History of DM, HTN, CAD.  Home DM Meds:  70/30 insulin- 65 units bidwc + Metformin 500 mg daily   **Note current insulin orders are as follows: 70/30 insulin- 30 units bidwc Novolog Resistant SSI Novolog 6 units tidwc Metformin 500 mg daily   MD- Novolog meal coverage not recommended with 70/30 insulin Please d/c order for Novolog 6 units tidwc   Will follow Wyn Quaker RN, MSN, CDE Diabetes Coordinator Inpatient Diabetes Program Team Pager: [phone] (8a-10p)

## 2014-03-10 NOTE — Progress Notes (Signed)
  Echocardiogram 2D Echocardiogram has been performed.  [place] 03/10/2014, 11:13 AM

## 2014-03-10 NOTE — Progress Notes (Addendum)
[place] for Heparin Indication: chest pain/ACS  Allergies  Allergen Reactions  . Latex Rash    unknown  . Percocet [Oxycodone-Acetaminophen] Itching         Patient Measurements: Height: 5\' 7"  (170.2 cm) Weight: 340 lb (154.223 kg) IBW/kg (Calculated) : 61.6 Heparin Dosing Weight: 103  Vital Signs: Temp: 97.8 F (36.6 C) (05/06 1634) Temp src: Oral (05/06 1634) BP: 128/60 mmHg (05/06 1634) Pulse Rate: 73 (05/06 1634)  Labs:  Recent Labs  03/09/14 0645 03/09/14 1245 03/09/14 2125 03/10/14 0045 03/10/14 0630 03/10/14 1045 03/10/14 1636  HGB 10.2*  --   --   --   --  10.2*  --   HCT 32.4*  --   --   --   --  32.7*  --   PLT 227  --   --   --   --  229  --   HEPARINUNFRC  --   --  <0.10*  --  1.28*  --  0.97*  CREATININE 0.70  --   --  0.83  --   --   --   TROPONINI  --  <0.30 <0.30 <0.30  --   --   --     Estimated Creatinine Clearance: 115 ml/min (by C-G formula based on Cr of 0.83).   Assessment: 58 y.o. female with chest pain on heparin drip.  PM heparin level still elevated at 0.97   Goal of Therapy:  Heparin level 0.3-0.7 units/ml Monitor platelets by anticoagulation protocol: Yes   Plan:  1) Decrease heparin to 1950 units / hr 2) Follow up AM labs  Thank you. Anette Guarneri, PharmD [phone]

## 2014-03-10 NOTE — ED Provider Notes (Signed)
CSN: [id number]     Arrival date & time 03/09/14  0503 History   First MD Initiated Contact with Patient 03/09/14 [phone]     Chief Complaint  Patient presents with  . Leg Pain  . Chest Pain  . Headache     (Consider location/radiation/quality/duration/timing/severity/associated sxs/prior Treatment) HPI Comments: 58 year old black female presented with substernal chest pain of 1-day duration and right leg pain. Pt has hx of CAD, s/p stents few years ago, no recent provocative testing and she has DM, HTN.  Chest pain is atypical, described as throbbing pain, radiating to the left side and worse with breathing and with exertion. She has had PE in the past. + DIB.  Patient is a 58 y.o. female presenting with leg pain, chest pain, and headaches. The history is provided by the patient and medical records.  Leg Pain Associated symptoms: no neck pain   Chest Pain Associated symptoms: headache and shortness of breath   Associated symptoms: no abdominal pain, no nausea and not vomiting   Headache Associated symptoms: myalgias   Associated symptoms: no abdominal pain, no nausea, no neck pain and no vomiting     Past Medical History  Diagnosis Date  . GERD (gastroesophageal reflux disease)   . Colon polyp   . Asthma   . CAD (coronary artery disease)   . Diabetes mellitus   . Hypertension   . Osteoarthritis   . Microcytosis   . Sleep disorder   . Obesity   . Depression 11/07    Hospitalization required  . Cataract   . Diverticulosis   . Esophageal stricture   . Hemorrhoids   . IBS (irritable bowel syndrome)    Past Surgical History  Procedure Laterality Date  . Abdominal hysterectomy    . Eye surgery    . Upper gastrointestinal endoscopy    . Colonoscopy    . Abdominal hysterectomy     Family History  Problem Relation Age of Onset  . Rectal cancer Sister   . Lung cancer Father   . Esophageal cancer Father   . Breast cancer Sister 89  . Breast cancer Mother 55  . Colon  cancer Other     Aunt, Uncle  . Liver cancer Brother     Nephew  . Diabetes Sister   . Diabetes Brother   . Diabetes Other     son & daughter  . Heart disease Sister   . Heart disease Brother   . Kidney disease Sister   . Breast cancer Maternal Aunt     patient reports 11 mat aunts with breast cancer  . Breast cancer Paternal Aunt 85  . Breast cancer Maternal Grandmother     unk age   History  Substance Use Topics  . Smoking status: Never Smoker   . Smokeless tobacco: Never Used  . Alcohol Use: No   OB History   Grav Para Term Preterm Abortions TAB SAB Ect Mult Living                 Review of Systems  Constitutional: Negative for activity change.  Respiratory: Positive for shortness of breath.   Cardiovascular: Positive for chest pain.  Gastrointestinal: Negative for nausea, vomiting and abdominal pain.  Genitourinary: Negative for dysuria.  Musculoskeletal: Positive for arthralgias and myalgias. Negative for neck pain.  Neurological: Positive for headaches.  All other systems reviewed and are negative.     Allergies  Latex and Percocet  Home Medications   Prior to Admission  medications   Medication Sig Start Date End Date Taking? Authorizing Provider  albuterol (PROVENTIL,VENTOLIN) 90 MCG/ACT inhaler Inhale 2 puffs into the lungs every 6 (six) hours as needed for shortness of breath. Wheezing or shortness of breath   Yes Historical Provider, MD  amLODipine (NORVASC) 5 MG tablet Take 1 tablet (5 mg total) by mouth daily. 12/09/13  Yes Harden Mo, MD  aspirin 81 MG tablet Take 81 mg by mouth daily.     Yes Historical Provider, MD  benzonatate (TESSALON) 100 MG capsule Take 1 capsule (100 mg total) by mouth every 8 (eight) hours. 12/27/13  Yes Teressa Lower, MD  budesonide-formoterol [facility]) 160-4.5 MCG/ACT inhaler Inhale 2 puffs into the lungs 2 (two) times daily. 12/09/13  Yes Harden Mo, MD  diclofenac sodium (VOLTAREN) 1 % GEL Apply 2 g topically 4 (four)  times daily.   Yes Historical Provider, MD  dicyclomine (BENTYL) 10 MG capsule Take 1 tab 3 times a day before meals. 05/22/11  Yes Amy S Ester[place], PA-C  ergocalciferol (VITAMIN D2) 50000 UNITS capsule Take 50,000 Units by mouth once a week. Usually takes on Saturday or fridays   Yes Historical Provider, MD  famotidine (PEPCID) 40 MG tablet Take 40 mg by mouth daily.   Yes Historical Provider, MD  fenofibrate (TRICOR) 145 MG tablet Take 145 mg by mouth daily.     Yes Historical Provider, MD  ferrous sulfate 325 (65 FE) MG tablet Take 325 mg by mouth daily with breakfast.     Yes Historical Provider, MD  fluticasone-salmeterol (ADVAIR HFA) 115-21 MCG/ACT inhaler Inhale 2 puffs into the lungs 2 (two) times daily.   Yes Historical Provider, MD  HYDROcodone-acetaminophen (VICODIN) 5-500 MG per tablet Take 1 tablet by mouth every 6 (six) hours as needed.     Yes Historical Provider, MD  hyoscyamine (LEVBID) 0.375 MG 12 hr tablet Take 0.375 mg by mouth every 12 (twelve) hours as needed for cramping.   Yes Historical Provider, MD  insulin NPH-insulin regular (NOVOLIN 70/30) (70-30) 100 UNIT/ML injection Inject 65 Units into the skin 2 (two) times daily with a meal.    Yes Historical Provider, MD  loratadine (CLARITIN) 10 MG tablet Take 10 mg by mouth daily.    Yes Historical Provider, MD  metFORMIN (GLUCOPHAGE) 500 MG tablet Take 500 mg by mouth daily with breakfast.    Yes Historical Provider, MD  metoprolol (LOPRESSOR) 50 MG tablet Take 50 mg by mouth 2 (two) times daily.     Yes Historical Provider, MD  potassium chloride (K-DUR) 10 MEQ tablet Take 1 tablet (10 mEq total) by mouth 3 (three) times daily. 12/09/13  Yes Harden Mo, MD  promethazine (PHENERGAN) 25 MG tablet Take 1 tablet (25 mg total) by mouth every 6 (six) hours as needed for nausea or vomiting. 12/09/13  Yes Harden Mo, MD  simvastatin (ZOCOR) 40 MG tablet Take 40 mg by mouth at bedtime.    Yes Historical Provider, MD  tobramycin  (TOBREX) 0.3 % ophthalmic ointment Place 1 application into both eyes 4 (four) times daily.    Yes Historical Provider, MD  traMADol (ULTRAM) 50 MG tablet Take 50 mg by mouth every 6 (six) hours as needed for moderate pain.   Yes Historical Provider, MD  travoprost, benzalkonium, (TRAVATAN) 0.004 % ophthalmic solution Place 1 drop into both eyes at bedtime.     Yes Historical Provider, MD  Insulin Syringes, Disposable, U-100 1 ML MISC by Does not apply route.  Historical Provider, MD  ipratropium (ATROVENT) 0.02 % nebulizer solution Take 0.5 mg by nebulization 3 (three) times daily as needed for wheezing or shortness of breath.    Historical Provider, MD  Lancets MISC by Does not apply route.      Historical Provider, MD  NON FORMULARY [place] test strip( Glucose Blood)     Historical Provider, MD   BP 159/99  Pulse 71  Temp(Src) 97.8 F (36.6 C) (Oral)  Resp 16  Ht 5\' 7"  (1.702 m)  Wt 340 lb (154.223 kg)  BMI 53.24 kg/m2  SpO2 97% Physical Exam  Nursing note and vitals reviewed. Constitutional: She is oriented to person, place, and time. She appears well-developed and well-nourished.  HENT:  Head: Normocephalic and atraumatic.  Eyes: EOM are normal. Pupils are equal, round, and reactive to light.  Neck: Neck supple.  Cardiovascular: Normal rate, regular rhythm and normal heart sounds.   No murmur heard. Pulmonary/Chest: Effort normal. No respiratory distress.  Abdominal: Soft. She exhibits no distension. There is no tenderness. There is no rebound and no guarding.  Musculoskeletal:  RLE swelling, with no erythema, no signs of trauma/ + tenderness.  Neurological: She is alert and oriented to person, place, and time.  Skin: Skin is warm and dry.    ED Course  Procedures (including critical care time) Labs Review Labs Reviewed  CBC - Abnormal; Notable for the following:    Hemoglobin 10.2 (*)    HCT 32.4 (*)    MCV 70.1 (*)    MCH 22.1 (*)    RDW 18.6 (*)    All other  components within normal limits  COMPREHENSIVE METABOLIC PANEL - Abnormal; Notable for the following:    Potassium 3.5 (*)    Glucose, Bld 69 (*)    Total Bilirubin 0.2 (*)    All other components within normal limits  HEMOGLOBIN A1C - Abnormal; Notable for the following:    Hemoglobin A1C 7.5 (*)    Mean Plasma Glucose 169 (*)    All other components within normal limits  HEPARIN LEVEL (UNFRACTIONATED) - Abnormal; Notable for the following:    Heparin Unfractionated <0.10 (*)    All other components within normal limits  BASIC METABOLIC PANEL - Abnormal; Notable for the following:    Glucose, Bld 194 (*)    Calcium 8.2 (*)    GFR calc non Af Amer 76 (*)    GFR calc Af Amer 88 (*)    All other components within normal limits  GLUCOSE, CAPILLARY - Abnormal; Notable for the following:    Glucose-Capillary 114 (*)    All other components within normal limits  GLUCOSE, CAPILLARY - Abnormal; Notable for the following:    Glucose-Capillary 168 (*)    All other components within normal limits  CBG MONITORING, ED - Abnormal; Notable for the following:    Glucose-Capillary 113 (*)    All other components within normal limits  MRSA PCR SCREENING  PRO B NATRIURETIC PEPTIDE  TROPONIN I  TROPONIN I  TROPONIN I  LIPID PANEL  HEPARIN LEVEL (UNFRACTIONATED)  I-STAT TROPOININ, ED  CBG MONITORING, ED    Imaging Review Dg Chest 2 View  03/09/2014   CLINICAL DATA:  Mid chest pain, occasional short of breath  EXAM: CHEST  2 VIEW  COMPARISON:  Chest radiograph 12/15/2013  FINDINGS: Normal cardiac silhouette. There are low lung volumes. Mild central venous pulmonary congestion. No focal infiltrate. No pneumothorax Degenerative osteophytosis of the thoracic spine.  IMPRESSION: 1.  Low lung volumes. 2. Mild venous pulmonary congestion.   Electronically Signed   By: Suzy Bouchard M.D.   On: 03/09/2014 07:28     EKG Interpretation   Date/Time:  Tuesday Mar 09 2014 06:16:16 EDT Ventricular Rate:   83 PR Interval:  161 QRS Duration: 90 QT Interval:  406 QTC Calculation: 477 R Axis:   13 Text Interpretation:  Age not entered, assumed to be  58 years old for  purpose of ECG interpretation Sinus rhythm Borderline prolonged QT  interval Confirmed by Kathrynn Humble, MD, Rhiannon Sassaman [phone]) on 03/09/2014 7:10:15 AM      MDM   Final diagnoses:  None  Angina Leg pain  Pt with chest pain - and hx of CAD. Pain is atypical -but has some typical features. She also reports leg swelling, with hx of PE.  We ordered US duplex - which is neg.  Spoke with Dr Doylene Canard, about the chest pain - and he will admit her for obs.  My concerns for PE, for this 1 day pain, which reportedly has been off and on for few weeks - just got worse recently, is very low, due to the intermittent nature of the pain, reassuring EKG,reassuring vitals and neg US DVT.  Varney Biles, MD 03/10/14 [phone]

## 2014-03-10 NOTE — Progress Notes (Signed)
UR completed 

## 2014-03-11 ENCOUNTER — Observation Stay (HOSPITAL_COMMUNITY): Payer: Medicaid Other

## 2014-03-11 LAB — CBC
HCT: 32.4 % — ABNORMAL LOW (ref 36.0–46.0)
HEMOGLOBIN: 10.1 g/dL — AB (ref 12.0–15.0)
MCH: 22 pg — AB (ref 26.0–34.0)
MCHC: 31.2 g/dL (ref 30.0–36.0)
MCV: 70.4 fL — ABNORMAL LOW (ref 78.0–100.0)
Platelets: 220 10*3/uL (ref 150–400)
RBC: 4.6 MIL/uL (ref 3.87–5.11)
RDW: 18.7 % — ABNORMAL HIGH (ref 11.5–15.5)
WBC: 6.7 10*3/uL (ref 4.0–10.5)

## 2014-03-11 LAB — HEPARIN LEVEL (UNFRACTIONATED): HEPARIN UNFRACTIONATED: 0.55 [IU]/mL (ref 0.30–0.70)

## 2014-03-11 LAB — GLUCOSE, CAPILLARY: GLUCOSE-CAPILLARY: 155 mg/dL — AB (ref 70–99)

## 2014-03-11 MED ORDER — METFORMIN HCL 500 MG PO TABS: 500.0000 mg | ORAL_TABLET | Freq: Two times a day (BID) | ORAL | Status: DC

## 2014-03-11 MED ORDER — PROMETHAZINE HCL 25 MG PO TABS: 12.5000 mg | ORAL_TABLET | Freq: Four times a day (QID) | ORAL | Status: DC | PRN

## 2014-03-11 MED ORDER — SIMVASTATIN 40 MG PO TABS: 20.0000 mg | ORAL_TABLET | Freq: Every day | ORAL | Status: DC

## 2014-03-11 MED ORDER — INSULIN NPH ISOPHANE & REGULAR (70-30) 100 UNIT/ML ~~LOC~~ SUSP: 20.0000 [IU] | Freq: Two times a day (BID) | SUBCUTANEOUS | Status: DC

## 2014-03-11 NOTE — Progress Notes (Signed)
Late Entry  Ref: AVBUERE,EDWIN A, MD  Subjective:  Severe chest pain and headache. Chest pain poorly responding to NTG. Last 3 cardiac caths in 10 years without significant coronary artery disease and nuclear department refuses to do stress test due to weight limit. EKG without acute changes. Low blood sugars.  Objective:  Vital Signs in the last 24 hours: Temp:  [97.8 F (36.6 C)-98.3 F (36.8 C)] 97.9 F (36.6 C) (05/07 0756) Pulse Rate:  [72-77] 75 (05/07 0756) Cardiac Rhythm:  [-] Normal sinus rhythm (05/06 2010) Resp:  [17-20] 20 (05/07 0756) BP: (124-149)/(51-104) 145/104 mmHg (05/07 0756) SpO2:  [96 %-100 %] 100 % (05/07 0756) Weight:  [137.213 kg (302 lb 8 oz)] 137.213 kg (302 lb 8 oz) (05/07 0423)  Physical Exam: BP Readings from Last 1 Encounters:  03/11/14 145/104    Wt Readings from Last 1 Encounters:  03/11/14 137.213 kg (302 lb 8 oz)    Weight change:   HEENT: [place]/AT, Eyes-Brown, PERL, EOMI, Conjunctiva-Pale pink, Sclera-Non-icteric Neck: No JVD, No bruit, Trachea midline. Lungs:  Clear, Bilateral. Chest wall tender on palpation. Cardiac:  Regular rhythm, normal S1 and S2, no S3.  Abdomen:  Soft, non-tender. Extremities:  No edema present. No cyanosis. No clubbing. CNS: AxOx3, Cranial nerves grossly intact, moves all 4 extremities. Right handed. Skin: Warm and dry.   Intake/Output from previous day: 05/06 0701 - 05/07 0700 In: 780 [P.O.:780] Out: 2050 [Urine:2050]    Lab Results: BMET    Component Value Date/Time   NA 143 03/10/2014 0045   NA 145 03/09/2014 0645   NA 145 12/15/2013 1636   K 3.8 03/10/2014 0045   K 3.5* 03/09/2014 0645   K 3.4* 12/15/2013 1636   CL 101 03/10/2014 0045   CL 105 03/09/2014 0645   CL 104 12/15/2013 1636   CO2 28 03/10/2014 0045   CO2 26 03/09/2014 0645   CO2 27 12/15/2013 1636   GLUCOSE 194* 03/10/2014 0045   GLUCOSE 69* 03/09/2014 0645   GLUCOSE 107* 12/15/2013 1636   BUN 9 03/10/2014 0045   BUN 8 03/09/2014 0645   BUN 10 12/15/2013 1636    CREATININE 0.83 03/10/2014 0045   CREATININE 0.70 03/09/2014 0645   CREATININE 0.68 12/15/2013 1636   CALCIUM 8.2* 03/10/2014 0045   CALCIUM 8.6 03/09/2014 0645   CALCIUM 8.7 12/15/2013 1636   GFRNONAA 76* 03/10/2014 0045   GFRNONAA >90 03/09/2014 0645   GFRNONAA >90 12/15/2013 1636   GFRAA 88* 03/10/2014 0045   GFRAA >90 03/09/2014 0645   GFRAA >90 12/15/2013 1636   CBC    Component Value Date/Time   WBC 6.7 03/11/2014 0617   RBC 4.60 03/11/2014 0617   RBC 4.52 03/13/2009 0647   HGB 10.1* 03/11/2014 0617   HCT 32.4* 03/11/2014 0617   PLT 220 03/11/2014 0617   MCV 70.4* 03/11/2014 0617   MCH 22.0* 03/11/2014 0617   MCHC 31.2 03/11/2014 0617   RDW 18.7* 03/11/2014 0617   LYMPHSABS 3.1 12/15/2013 1551   MONOABS 0.4 12/15/2013 1551   EOSABS 0.1 12/15/2013 1551   BASOSABS 0.0 12/15/2013 1551   HEPATIC Function Panel  Recent Labs  12/15/13 1715 03/09/14 0645  PROT 7.5 7.5   HEMOGLOBIN A1C No components found with this basename: HGA1C,  MPG   CARDIAC ENZYMES Lab Results  Component Value Date   CKTOTAL 145 04/28/2011   CKMB 2.2 04/28/2011   TROPONINI <0.30 03/10/2014   TROPONINI <0.30 03/09/2014   TROPONINI <0.30 03/09/2014  BNP  Recent Labs  12/15/13 1636 03/09/14 0645  PROBNP 10.7 5.5   TSH No results found for this basename: TSH,  in the last 8760 hours CHOLESTEROL  Recent Labs  03/10/14 0045  CHOL 172    Scheduled Meds: . amLODipine  5 mg Oral Daily  . aspirin EC  81 mg Oral Daily  . budesonide-formoterol  2 puff Inhalation BID  . famotidine  40 mg Oral Daily  . fenofibrate  160 mg Oral Daily  . ferrous sulfate  325 mg Oral Q breakfast  . insulin aspart  0-20 Units Subcutaneous TID WC  . insulin aspart  6 Units Subcutaneous TID WC  . insulin aspart protamine- aspart  30 Units Subcutaneous BID WC  . loratadine  10 mg Oral Daily  . metFORMIN  500 mg Oral BID WC  . metoprolol  50 mg Oral BID  . potassium chloride  10 mEq Oral TID  . sodium chloride  3 mL Intravenous Q12H  . Travoprost  (BAK Free)  1 drop Both Eyes QHS   Continuous Infusions: . heparin 1,950 Units/hr (03/11/14 0454)   PRN Meds:.sodium chloride, albuterol, ALPRAZolam, bisacodyl, HYDROcodone-acetaminophen, hyoscyamine, ipratropium, nitroGLYCERIN, ondansetron (ZOFRAN) IV, sodium chloride  Assessment/Plan: Chest pain, musculoskeletal Headache with right sided tingling.  Hypertension  DM, II  Asthma  Gout  Morbid Obesity  Glaucoma  Fibromyalgia  Anxiety  CT brain. Will treat patient medically. Decrease Insulin dose.    LOS: 2 days    Dixie Dials  MD  03/11/2014, 9:19 AM

## 2014-03-11 NOTE — Discharge Instructions (Signed)
Diets for Diabetes, Food Labeling Look at food labels to help you decide how much of a product you can eat. You will want to check the amount of total carbohydrate in a serving to see how the food fits into your meal plan. In the list of ingredients, the ingredient present in the largest amount by weight must be listed first, followed by the other ingredients in descending order. STANDARD OF IDENTITY Most products have a list of ingredients. However, foods that the Food and Drug Administration (FDA) has given a standard of identity do not need a list of ingredients. A standard of identity means that a food must contain certain ingredients if it is called a particular name. Examples are mayonnaise, peanut butter, ketchup, jelly, and cheese. LABELING TERMS There are many terms found on food labels. Some of these terms have specific definitions. Some terms are regulated by the FDA, and the FDA has clearly specified how they can be used. Others are not regulated or well-defined and can be misleading and confusing. SPECIFICALLY DEFINED TERMS Nutritive Sweetener.  A sweetener that contains calories,such as table sugar or honey. Nonnutritive Sweetener.  A sweetener with few or no calories,such as saccharin, aspartame, sucralose, and cyclamate. LABELING TERMS REGULATED BY THE FDA Free.  The product contains only a tiny or small amount of fat, cholesterol, sodium, sugar, or calories. For example, a "fat-free" product will contain less than 0.5 g of fat per serving. Low.  A food described as "low" in fat, saturated fat, cholesterol, sodium, or calories could be eaten fairly often without exceeding dietary guidelines. For example, "low in fat" means no more than 3 g of fat per serving. Lean.  "Lean" and "extra lean" are U.S. Department of Agriculture Scientist, research (physical sciences)) terms for use on meat and poultry products. "Lean" means the product contains less than 10 g of fat, 4 g of saturated fat, and 95 mg of cholesterol  per serving. "Lean" is not as low in fat as a product labeled "low." Extra Lean.  "Extra lean" means the product contains less than 5 g of fat, 2 g of saturated fat, and 95 mg of cholesterol per serving. While "extra lean" has less fat than "lean," it is still higher in fat than a product labeled "low." Reduced, Less, Fewer.  A diet product that contains 25% less of a nutrient or calories than the regular version. For example, hot dogs might be labeled "25% less fat than our regular hot dogs." Light/Lite.  A diet product that contains  fewer calories or  the fat of the original. For example, "light in sodium" means a product with  the usual sodium. More.  One serving contains at least 10% more of the daily value of a vitamin, mineral, or fiber than usual. Good Source Of.  One serving contains 10% to 19% of the daily value for a particular vitamin, mineral, or fiber. Excellent Source Of.  One serving contains 20% or more of the daily value for a particular nutrient. Other terms used might be "high in" or "rich in." Enriched or Fortified.  The product contains added vitamins, minerals, or protein. Nutrition labeling must be used on enriched or fortified foods. Imitation.  The product has been altered so that it is lower in protein, vitamins, or minerals than the usual food,such as imitation peanut butter. Total Fat.  The number listed is the total of all fat found in a serving of the product. Under total fat, food labels must list saturated fat and  trans fat, which are associated with raising bad cholesterol and an increased risk of heart blood vessel disease. Saturated Fat.  Mainly fats from animal-based sources. Some examples are red meat, cheese, cream, whole milk, and coconut oil. Trans Fat.  Found in some fried snack foods, packaged foods, and fried restaurant foods. It is recommended you eat as close to 0 g of trans fat as possible, since it raises bad cholesterol and lowers  good cholesterol. Polyunsaturated and Monounsaturated Fats.  More healthful fats. These fats are from plant sources. Total Carbohydrate.  The number of carbohydrate grams in a serving of the product. Under total carbohydrate are listed the other carbohydrate sources, such as dietary fiber and sugars. Dietary Fiber.  A carbohydrate from plant sources. Sugars.  Sugars listed on the label contain all naturally occurring sugars as well as added sugars. LABELING TERMS NOT REGULATED BY THE FDA Sugarless.  Table sugar (sucrose) has not been added. However, the manufacturer may use another form of sugar in place of sucrose to sweeten the product. For example, sugar alcohols are used to sweeten foods. Sugar alcohols are a form of sugar but are not table sugar. If a product contains sugar alcohols in place of sucrose, it can still be labeled "sugarless." Low Salt, Salt-Free, Unsalted, No Salt, No Salt Added, Without Added Salt.  Food that is usually processed with salt has been made without salt. However, the food may contain sodium-containing additives, such as preservatives, leavening agents, or flavorings. Natural.  This term has no legal meaning. Organic.  Foods that are certified as organic have been inspected and approved by the USDA to ensure they are produced without pesticides, fertilizers containing synthetic ingredients, bioengineering, or ionizing radiation. Document Released: 10/25/2003 Document Revised: 01/14/2012 Document Reviewed: 05/12/2009 [facility] Patient Information 2014 [place], [place].  Diabetes Meal Planning Guide The diabetes meal planning guide is a tool to help you plan your meals and snacks. It is important for people with diabetes to manage their blood glucose (sugar) levels. Choosing the right foods and the right amounts throughout your day will help control your blood glucose. Eating right can even help you improve your blood pressure and reach or maintain a healthy  weight. CARBOHYDRATE COUNTING MADE EASY When you eat carbohydrates, they turn to sugar. This raises your blood glucose level. Counting carbohydrates can help you control this level so you feel better. When you plan your meals by counting carbohydrates, you can have more flexibility in what you eat and balance your medicine with your food intake. Carbohydrate counting simply means adding up the total amount of carbohydrate grams in your meals and snacks. Try to eat about the same amount at each meal. Foods with carbohydrates are listed below. Each portion below is 1 carbohydrate serving or 15 grams of carbohydrates. Ask your dietician how many grams of carbohydrates you should eat at each meal or snack. Grains and Starches  1 slice bread.   English muffin or hotdog/hamburger bun.   cup cold cereal (unsweetened).   cup cooked pasta or rice.   cup starchy vegetables (corn, potatoes, peas, beans, winter squash).  1 tortilla (6 inches).   bagel.  1 waffle or pancake (size of a CD).   cup cooked cereal.  4 to 6 small crackers. *Whole grain is recommended. Fruit  1 cup fresh unsweetened berries, melon, papaya, pineapple.  1 small fresh fruit.   banana or mango.   cup fruit juice (4 oz unsweetened).   cup canned fruit in natural  juice or water.  2 tbs dried fruit.  12 to 15 grapes or cherries. Milk and Yogurt  1 cup fat-free or 1% milk.  1 cup soy milk.  6 oz light yogurt with sugar-free sweetener.  6 oz low-fat soy yogurt.  6 oz plain yogurt. Vegetables  1 cup raw or  cup cooked is counted as 0 carbohydrates or a "free" food.  If you eat 3 or more servings at 1 meal, count them as 1 carbohydrate serving. Other Carbohydrates   oz chips or pretzels.   cup ice cream or frozen yogurt.   cup sherbet or sorbet.  2 inch square cake, no frosting.  1 tbs honey, sugar, jam, jelly, or syrup.  2 small cookies.  3 squares of graham crackers.  3 cups  popcorn.  6 crackers.  1 cup broth-based soup.  Count 1 cup casserole or other mixed foods as 2 carbohydrate servings.  Foods with less than 20 calories in a serving may be counted as 0 carbohydrates or a "free" food. You may want to purchase a book or computer software that lists the carbohydrate gram counts of different foods. In addition, the nutrition facts panel on the labels of the foods you eat are a good source of this information. The label will tell you how big the serving size is and the total number of carbohydrate grams you will be eating per serving. Divide this number by 15 to obtain the number of carbohydrate servings in a portion. Remember, 1 carbohydrate serving equals 15 grams of carbohydrate. SERVING SIZES Measuring foods and serving sizes helps you make sure you are getting the right amount of food. The list below tells how big or small some common serving sizes are.  1 oz.........4 stacked dice.  3 oz........Marland KitchenDeck of cards.  1 tsp.......Marland KitchenTip of little finger.  1 tbs......Marland KitchenMarland KitchenThumb.  2 tbs.......Marland KitchenGolf ball.   cup......Marland KitchenHalf of a fist.  1 cup.......Marland KitchenA fist. SAMPLE DIABETES MEAL PLAN Below is a sample meal plan that includes foods from the grain and starches, dairy, vegetable, fruit, and meat groups. A dietician can individualize a meal plan to fit your calorie needs and tell you the number of servings needed from each food group. However, controlling the total amount of carbohydrates in your meal or snack is more important than making sure you include all of the food groups at every meal. You may interchange carbohydrate containing foods (dairy, starches, and fruits). The meal plan below is an example of a 2000 calorie diet using carbohydrate counting. This meal plan has 17 carbohydrate servings. Breakfast  1 cup oatmeal (2 carb servings).   cup light yogurt (1 carb serving).  1 cup blueberries (1 carb serving).   cup almonds. Snack  1 large apple (2 carb  servings).  1 low-fat string cheese stick. Lunch  Chicken breast salad.  1 cup spinach.   cup chopped tomatoes.  2 oz chicken breast, sliced.  2 tbs low-fat [place] dressing.  12 whole-wheat crackers (2 carb servings).  12 to 15 grapes (1 carb serving).  1 cup low-fat milk (1 carb serving). Snack  1 cup carrots.   cup hummus (1 carb serving). Dinner  3 oz broiled salmon.  1 cup brown rice (3 carb servings). Snack  1  cups steamed broccoli (1 carb serving) drizzled with 1 tsp olive oil and lemon juice.  1 cup light pudding (2 carb servings). DIABETES MEAL PLANNING WORKSHEET Your dietician can use this worksheet to help you decide how many servings  of foods and what types of foods are right for you.  °BREAKFAST °Food Group and Servings / Carb Servings °Grain/Starches __________________________________ °Dairy __________________________________________ °Vegetable ______________________________________ °Fruit ___________________________________________ °Meat __________________________________________ °Fat ____________________________________________ °LUNCH °Food Group and Servings / Carb Servings °Grain/Starches ___________________________________ °Dairy ___________________________________________ °Fruit ____________________________________________ °Meat ___________________________________________ °Fat _____________________________________________ °DINNER °Food Group and Servings / Carb Servings °Grain/Starches ___________________________________ °Dairy ___________________________________________ °Fruit ____________________________________________ °Meat ___________________________________________ °Fat _____________________________________________ °SNACKS °Food Group and Servings / Carb Servings °Grain/Starches ___________________________________ °Dairy ___________________________________________ °Vegetable _______________________________________ °Fruit  ____________________________________________ °Meat ___________________________________________ °Fat _____________________________________________ °DAILY TOTALS °Starches _________________________ °Vegetable ________________________ °Fruit ____________________________ °Dairy ____________________________ °Meat ____________________________ °Fat ______________________________ °Document Released: 07/19/2005 Document Revised: 01/14/2012 Document Reviewed: 05/30/2009 °[employer]® Patient Information ©2014 [employer]. ° ° °

## 2014-03-11 NOTE — Discharge Summary (Signed)
Physician Discharge Summary  Patient ID: Chelsea Lester MRN: [id number] DOB/AGE: [birth year] 58 y.o.  Admit date: 03/09/2014 Discharge date: 03/11/2014  Admission Diagnoses: Chest pain  Headache  Hypertension  DM, II  Asthma  Gout  Morbid Obesity  Glaucoma  Fibromyalgia  Anxiety  Discharge Diagnoses:  Principle Problem: * Chest pain, musculoskeletal * Headache with right sided tingling, possible migraine.  Hypertension  DM, II  Asthma  Gout  Morbid Obesity  Glaucoma  Fibromyalgia  Anxiety Chronic iron deficiency anemia Mild Diastolic dysfunction of Left ventricle Mild Mitral regurgitation  Discharged Condition: fair  Hospital Course: 58 year old black female presented with substernal chest pain of 1-day duration. Chest pain was increased with breathing. She also has right sided body ache and leg pain and swelling. Her vascular study of leg was negative for DVT. The patient mentioned chest pain radiated to the left arm and had some shortness of breath. Her cardiac enzymes were normal. She could not do regular TMST due to leg pain and had weight limit issue for nuclear stress test. Her cardiac cath x 3 in last 10 years has shown normal coronaries hence patient will be treated medically for now. She was also advised to resume diet to lose weight and decrease insulin dose accordingly. Her insulin dose was decrease about 70 % with increasing metformin to bid and by dieting here in hospital. She was discharged home in stable condition with follow up by primary care in 1 week.  Consults: cardiology  Significant Diagnostic Studies: labs: Normal CBC except Hgb of 10.2 and HCT of 32.4. Near normal Electrolytes post potassium supplementation.  EKG-SR with nonspecific T wave changes.  CXR- Low lung volumes and mild venous congestion.  CT brain without acute finding.  Echocardiogram: Left ventricle: The cavity size was normal. There was mild concentric hypertrophy. Systolic function  was mildly reduced. The estimated ejection fraction was in the range of 45% to 50%. There is mild hypokinesis of the inferior myocardium. Doppler parameters are consistent with abnormal left ventricular relaxation (grade 1 diastolic dysfunction). - Mitral valve: Mild regurgitation. - Left atrium: The atrium was mildly dilated. - Pulmonary arteries: Systolic pressure was mildly increased. PA peak pressure: 62mm Hg (S).    Treatments: cardiac meds: Aspirin, metoprolol, amlodipine and fenofibrate.  Discharge Exam: Blood pressure 145/104, pulse 75, temperature 97.9 F (36.6 C), temperature source Oral, resp. rate 20, height 5\' 7"  (1.702 m), weight 137.213 kg (302 lb 8 oz), SpO2 100.00%. HEENT: [place]/AT, Eyes-Brown, PERL, EOMI, Conjunctiva-Pale pink, Sclera-Non-icteric  Neck: No JVD, No bruit, Trachea midline.  Lungs: Clear, Bilateral. Chest wall tender on palpation.  Cardiac: Regular rhythm, normal S1 and S2, no S3. II/VI systolic murmur. Abdomen: Soft, non-tender.  Extremities: Trace edema present. No cyanosis. No clubbing.  CNS: AxOx3, Cranial nerves grossly intact, moves all 4 extremities. Right handed.  Skin: Warm and dry.   Disposition: 01-Home or Self Care     Medication List    STOP taking these medications       benzonatate 100 MG capsule  Commonly known as:  TESSALON     budesonide-formoterol 160-4.5 MCG/ACT inhaler  Commonly known as:  SYMBICORT      TAKE these medications       albuterol 90 MCG/ACT inhaler  Commonly known as:  PROVENTIL,VENTOLIN  Inhale 2 puffs into the lungs every 6 (six) hours as needed for shortness of breath. Wheezing or shortness of breath     amLODipine 5 MG tablet  Commonly known as:  NORVASC  Take 1 tablet (5 mg total) by mouth daily.     aspirin 81 MG tablet  Take 81 mg by mouth daily.     diclofenac sodium 1 % Gel  Commonly known as:  VOLTAREN  Apply 2 g topically 4 (four) times daily.     dicyclomine 10 MG capsule  Commonly known  as:  BENTYL  Take 1 tab 3 times a day before meals.     ergocalciferol 50000 UNITS capsule  Commonly known as:  VITAMIN D2  Take 50,000 Units by mouth once a week. Usually takes on Saturday or fridays     famotidine 40 MG tablet  Commonly known as:  PEPCID  Take 40 mg by mouth daily.     fenofibrate 145 MG tablet  Commonly known as:  TRICOR  Take 145 mg by mouth daily.     ferrous sulfate 325 (65 FE) MG tablet  Take 325 mg by mouth daily with breakfast.     fluticasone-salmeterol 115-21 MCG/ACT inhaler  Commonly known as:  ADVAIR HFA  Inhale 2 puffs into the lungs 2 (two) times daily.     HYDROcodone-acetaminophen 5-500 MG per tablet  Commonly known as:  VICODIN  Take 1 tablet by mouth every 6 (six) hours as needed.     hyoscyamine 0.375 MG 12 hr tablet  Commonly known as:  LEVBID  Take 0.375 mg by mouth every 12 (twelve) hours as needed for cramping.     insulin NPH-regular Human (70-30) 100 UNIT/ML injection  Commonly known as:  NOVOLIN 70/30  Inject 20 Units into the skin 2 (two) times daily with a meal.     Insulin Syringes (Disposable) U-100 1 ML Misc  by Does not apply route.     ipratropium 0.02 % nebulizer solution  Commonly known as:  ATROVENT  Take 0.5 mg by nebulization 3 (three) times daily as needed for wheezing or shortness of breath.     Lancets Misc  by Does not apply route.     loratadine 10 MG tablet  Commonly known as:  CLARITIN  Take 10 mg by mouth daily.     metFORMIN 500 MG tablet  Commonly known as:  GLUCOPHAGE  Take 1 tablet (500 mg total) by mouth 2 (two) times daily with a meal.     metoprolol 50 MG tablet  Commonly known as:  LOPRESSOR  Take 50 mg by mouth 2 (two) times daily.     NON FORMULARY  T[employer] test strip( Glucose Blood)     potassium chloride 10 MEQ tablet  Commonly known as:  K-DUR  Take 1 tablet (10 mEq total) by mouth 3 (three) times daily.     promethazine 25 MG tablet  Commonly known as:  PHENERGAN  Take 0.5  tablets (12.5 mg total) by mouth every 6 (six) hours as needed for nausea or vomiting.     simvastatin 40 MG tablet  Commonly known as:  ZOCOR  Take 0.5 tablets (20 mg total) by mouth at bedtime.     tobramycin 0.3 % ophthalmic ointment  Commonly known as:  TOBREX  Place 1 application into both eyes 4 (four) times daily.     traMADol 50 MG tablet  Commonly known as:  ULTRAM  Take 50 mg by mouth every 6 (six) hours as needed for moderate pain.     travoprost (benzalkonium) 0.004 % ophthalmic solution  Commonly known as:  TRAVATAN  Place 1 drop into both eyes at bedtime.  Follow-up Information   Follow up with AVBUERE,EDWIN A, MD. Schedule an appointment as soon as possible for a visit in 1 week.   Specialty:  Internal Medicine   Contact information:   [address] [place] [address] [phone]       Signed: [name] 03/11/2014, 9:26 AM

## 2014-03-11 NOTE — Clinical Social Work Psych Assess (Signed)
Clinical Social Work Department CLINICAL SOCIAL WORK PSYCHIATRY SERVICE LINE ASSESSMENT 03/11/2014  Patient:  Chelsea Lester  Account:  [id number]  [place] Date:  03/09/2014  Clinical Social Worker:  Wylene Men  Date/Time:  03/10/2014 03:05 PM Referred by:  RN  Date referred:  03/10/2014 Reason for Referral  Other - See comment  Psychosocial assessment   Presenting Symptoms/Problems (In the person's/family's own words):   Pt was assessed by Diabetes Coordinator.  PHQ9 score 15. Psych CSW consulted for evaulation.   Abuse/Neglect/Trauma History (check all that apply)  Denies history   Abuse/Neglect/Trauma Comments:   denies   Psychiatric History (check all that apply)  Denies history   Psychiatric medications:  pt denies  none reported or noted in the chart   Current Mental Health Hospitalizations/Previous Mental Health History:   pt denies hx/present MH services in place   Current provider:   none   Place and Date:   none   Current Medications:   Scheduled Meds:      . amLODipine  5 mg Oral Daily  . aspirin EC  81 mg Oral Daily  . budesonide-formoterol  2 puff Inhalation BID  . famotidine  40 mg Oral Daily  . fenofibrate  160 mg Oral Daily  . ferrous sulfate  325 mg Oral Q breakfast  . insulin aspart  0-20 Units Subcutaneous TID WC  . insulin aspart  6 Units Subcutaneous TID WC  . insulin aspart protamine- aspart  30 Units Subcutaneous BID WC  . loratadine  10 mg Oral Daily  . metFORMIN  500 mg Oral BID WC  . metoprolol  50 mg Oral BID  . potassium chloride  10 mEq Oral TID  . sodium chloride  3 mL Intravenous Q12H  . Travoprost (BAK Free)  1 drop Both Eyes QHS        Continuous Infusions:      . heparin 1,950 Units/hr (03/11/14 0454)          PRN Meds:.sodium chloride, albuterol, ALPRAZolam, bisacodyl, HYDROcodone-acetaminophen, hyoscyamine, ipratropium, nitroGLYCERIN, ondansetron (ZOFRAN) IV, sodium chloride       Previous Impatient  Admission/Date/Reason:   Pt was seen in the ED x3 in February:  2/22- ED congestion  2/10- ED chest/body pain  2/4- ED cold sxs   Emotional Health / Current Symptoms    Suicide/Self Harm  None reported   Suicide attempt in the past:   pt denies hx/present SI/attempts   Other harmful behavior:   pt denies hx/present HI/attempts   Psychotic/Dissociative Symptoms  None reported   Other Psychotic/Dissociative Symptoms:   none exhibited    Attention/Behavioral Symptoms  Withdrawn   Other Attention / Behavioral Symptoms:   none exhibited    Cognitive Impairment  Orientation - Place  Orientation - Self  Orientation - Situation  Orientation - Time   Other Cognitive Impairment:   none exhibited    Mood and Adjustment  Anxious  DEPRESSION    Stress, Anxiety, Trauma, Any Recent Loss/Stressor  Anxiety   Anxiety (frequency):   Pt exhibited minimal anxiety due to her medical issues.  Pt feels that she cannot get her body "under control"   Phobia (specify):   pt denied   Compulsive behavior (specify):   pt denied   Obsessive behavior (specify):   pt denied   Other:   pt denied any other stress, anxiety, trauma or loss/stressors other than her health and lack of mobility   Substance Abuse/Use  None   SBIRT completed (  please refer for detailed history):  N  Self-reported substance use:   UDS untested  Pt denies use   Urinary Drug Screen Completed:  N Alcohol level:   BAL untested  pt denies use    Environmental/Housing/Living Arrangement  Stable housing   Who is in the home:   spouse, [name]   Emergency contact:  spouse, [name] [phone]   Financial  Medicaid   Patient's Strengths and Goals (patient's own words):   Pt has Medicaid.  Pt has access to healthcare/Mental Health care.  Pt has a stable home and supportive family: spouse, children.   Clinical Social Worker's Interpretive Summary:   Psych CSW assessed pt at bedside.  Pt was  lying, resting quietly in the bed during the assessment.  Pt was alert and oriented x4.    Pt has scored 15 on the PHQ9 assessment tool.  Pt states she remembers speaking with the Diabetes Coordinator and completing the assessment.  Pt reports her answers were directly related to her feelings re: her medical condition. Pt reports that she does not feel like doing anything but laying/sitting around.  Pt feel like she has no energy and no "get up and go".  Pt states that all she wants to do is sleep. Pt contributes this to two things: her health and the way she feels hopeless regarding her health and weight. Pt is obese and walks with a rolling walker.  Pt states that she is "not old enough to walk like this".  Psych CSW provided psychoeducation re: deprression and anxiety.  Pt was receptive and received handouts/materials.  Pt states that she is not ready to seek tx from a counselor/psychiatrist, but was agreeable to the resources given.    Pt denies hx and present SI.  Pt denies any past/present attempts.  Pt denies HI (both past and present).  Pt reports no AVHD and none were exhibited throughout this assessment.    Pt denies SA and ETOH use/abuse.  Pt reports being "somewhat" complaint with her diet/exercise for diabetes management.  Psych CSW provided psychoeducation re: the importance of compliance and the coorelation between compliance,hopsital re-admissions and longevity.  Pt was receptive and agreeable to focus on complaince upon dc.    [facility] was given as a resouce for mental health follow up re: anxiety, depression.    Psych CSW signing off.   Disposition:  Outpatient referral made/needed Nonnie Done, [place] [phone]  Clinical Social Work

## 2014-03-22 ENCOUNTER — Encounter (HOSPITAL_COMMUNITY): Payer: Self-pay | Admitting: Emergency Medicine

## 2014-03-22 ENCOUNTER — Emergency Department (HOSPITAL_COMMUNITY)
Admission: EM | Admit: 2014-03-22 | Discharge: 2014-03-22 | Disposition: A | Discharge status: Home / Self Care | Payer: Medicaid Other | Attending: Emergency Medicine | Admitting: Emergency Medicine

## 2014-03-22 DIAGNOSIS — Z91040 Latex allergy status: Secondary | ICD-10-CM | POA: Insufficient documentation

## 2014-03-22 DIAGNOSIS — Y92410 Unspecified street and highway as the place of occurrence of the external cause: Secondary | ICD-10-CM | POA: Insufficient documentation

## 2014-03-22 DIAGNOSIS — M1990 Unspecified osteoarthritis, unspecified site: Secondary | ICD-10-CM | POA: Insufficient documentation

## 2014-03-22 DIAGNOSIS — Z791 Long term (current) use of non-steroidal anti-inflammatories (NSAID): Secondary | ICD-10-CM | POA: Insufficient documentation

## 2014-03-22 DIAGNOSIS — K589 Irritable bowel syndrome without diarrhea: Secondary | ICD-10-CM | POA: Insufficient documentation

## 2014-03-22 DIAGNOSIS — F329 Major depressive disorder, single episode, unspecified: Secondary | ICD-10-CM | POA: Insufficient documentation

## 2014-03-22 DIAGNOSIS — Z79899 Other long term (current) drug therapy: Secondary | ICD-10-CM | POA: Diagnosis not present

## 2014-03-22 DIAGNOSIS — G44209 Tension-type headache, unspecified, not intractable: Secondary | ICD-10-CM | POA: Insufficient documentation

## 2014-03-22 DIAGNOSIS — Z860100 Personal history of colon polyps, unspecified: Secondary | ICD-10-CM | POA: Insufficient documentation

## 2014-03-22 DIAGNOSIS — F3289 Other specified depressive episodes: Secondary | ICD-10-CM | POA: Insufficient documentation

## 2014-03-22 DIAGNOSIS — I10 Essential (primary) hypertension: Secondary | ICD-10-CM | POA: Diagnosis not present

## 2014-03-22 DIAGNOSIS — IMO0002 Reserved for concepts with insufficient information to code with codable children: Secondary | ICD-10-CM | POA: Insufficient documentation

## 2014-03-22 DIAGNOSIS — S0993XA Unspecified injury of face, initial encounter: Secondary | ICD-10-CM | POA: Diagnosis present

## 2014-03-22 DIAGNOSIS — S39012A Strain of muscle, fascia and tendon of lower back, initial encounter: Secondary | ICD-10-CM

## 2014-03-22 DIAGNOSIS — H532 Diplopia: Secondary | ICD-10-CM | POA: Diagnosis not present

## 2014-03-22 DIAGNOSIS — E119 Type 2 diabetes mellitus without complications: Secondary | ICD-10-CM | POA: Diagnosis not present

## 2014-03-22 DIAGNOSIS — Y9389 Activity, other specified: Secondary | ICD-10-CM | POA: Diagnosis not present

## 2014-03-22 DIAGNOSIS — I2510 Atherosclerotic heart disease of native coronary artery without angina pectoris: Secondary | ICD-10-CM | POA: Insufficient documentation

## 2014-03-22 DIAGNOSIS — J45909 Unspecified asthma, uncomplicated: Secondary | ICD-10-CM | POA: Insufficient documentation

## 2014-03-22 DIAGNOSIS — K219 Gastro-esophageal reflux disease without esophagitis: Secondary | ICD-10-CM | POA: Diagnosis not present

## 2014-03-22 DIAGNOSIS — S139XXA Sprain of joints and ligaments of unspecified parts of neck, initial encounter: Secondary | ICD-10-CM | POA: Diagnosis not present

## 2014-03-22 DIAGNOSIS — G479 Sleep disorder, unspecified: Secondary | ICD-10-CM | POA: Diagnosis not present

## 2014-03-22 DIAGNOSIS — Z9889 Other specified postprocedural states: Secondary | ICD-10-CM | POA: Insufficient documentation

## 2014-03-22 DIAGNOSIS — Z8669 Personal history of other diseases of the nervous system and sense organs: Secondary | ICD-10-CM | POA: Diagnosis not present

## 2014-03-22 DIAGNOSIS — E669 Obesity, unspecified: Secondary | ICD-10-CM | POA: Diagnosis not present

## 2014-03-22 DIAGNOSIS — Z794 Long term (current) use of insulin: Secondary | ICD-10-CM | POA: Diagnosis not present

## 2014-03-22 DIAGNOSIS — Z7982 Long term (current) use of aspirin: Secondary | ICD-10-CM | POA: Insufficient documentation

## 2014-03-22 DIAGNOSIS — S161XXA Strain of muscle, fascia and tendon at neck level, initial encounter: Secondary | ICD-10-CM

## 2014-03-22 DIAGNOSIS — S0990XA Unspecified injury of head, initial encounter: Secondary | ICD-10-CM | POA: Insufficient documentation

## 2014-03-22 MED ORDER — METHOCARBAMOL 500 MG PO TABS: 500.0000 mg | ORAL_TABLET | Freq: Two times a day (BID) | ORAL | Status: DC

## 2014-03-22 NOTE — Discharge Instructions (Signed)
Recommend you take Robaxin as prescribed for muscle spasm. You may take Norco as already prescribed to for breakthrough pain control. Recommend ice to areas of pain 3-4 times per day for at least 30 minutes each time to help improve inflammation. Followup with your primary care provider by week's end. Return if you experience loss of control of your bowel or bladder, vision loss, hearing loss, loss of consciousness, vomiting, or new numbness/weakness in your extremities.  Back Pain, Adult Low back pain is very common. About 1 in 5 people have back pain.The cause of low back pain is rarely dangerous. The pain often gets better over time.About half of people with a sudden onset of back pain feel better in just 2 weeks. About 8 in 10 people feel better by 6 weeks.  CAUSES Some common causes of back pain include:  Strain of the muscles or ligaments supporting the spine.  Wear and tear (degeneration) of the spinal discs.  Arthritis.  Direct injury to the back. DIAGNOSIS Most of the time, the direct cause of low back pain is not known.However, back pain can be treated effectively even when the exact cause of the pain is unknown.Answering your caregiver's questions about your overall health and symptoms is one of the most accurate ways to make sure the cause of your pain is not dangerous. If your caregiver needs more information, he or she may order lab work or imaging tests (X-rays or MRIs).However, even if imaging tests show changes in your back, this usually does not require surgery. HOME CARE INSTRUCTIONS For many people, back pain returns.Since low back pain is rarely dangerous, it is often a condition that people can learn to [facility] their own.   Remain active. It is stressful on the back to sit or stand in one place. Do not sit, drive, or stand in one place for more than 30 minutes at a time. Take short walks on level surfaces as soon as pain allows.Try to increase the length of time you  walk each day.  Do not stay in bed.Resting more than 1 or 2 days can delay your recovery.  Do not avoid exercise or work.Your body is made to move.It is not dangerous to be active, even though your back may hurt.Your back will likely heal faster if you return to being active before your pain is gone.  Pay attention to your body when you bend and lift. Many people have less discomfortwhen lifting if they bend their knees, keep the load close to their bodies,and avoid twisting. Often, the most comfortable positions are those that put less stress on your recovering back.  Find a comfortable position to sleep. Use a firm mattress and lie on your side with your knees slightly bent. If you lie on your back, put a pillow under your knees.  Only take over-the-counter or prescription medicines as directed by your caregiver. Over-the-counter medicines to reduce pain and inflammation are often the most helpful.Your caregiver may prescribe muscle relaxant drugs.These medicines help dull your pain so you can more quickly return to your normal activities and healthy exercise.  Put ice on the injured area.  Put ice in a plastic bag.  Place a towel between your skin and the bag.  Leave the ice on for 15-20 minutes, 03-04 times a day for the first 2 to 3 days. After that, ice and heat may be alternated to reduce pain and spasms.  Ask your caregiver about trying back exercises and gentle massage. This may  be of some benefit.  Avoid feeling anxious or stressed.Stress increases muscle tension and can worsen back pain.It is important to recognize when you are anxious or stressed and learn ways to manage it.Exercise is a great option. SEEK MEDICAL CARE IF:  You have pain that is not relieved with rest or medicine.  You have pain that does not improve in 1 week.  You have new symptoms.  You are generally not feeling well. SEEK IMMEDIATE MEDICAL CARE IF:   You have pain that radiates from your  back into your legs.  You develop new bowel or bladder control problems.  You have unusual weakness or numbness in your arms or legs.  You develop nausea or vomiting.  You develop abdominal pain.  You feel faint. Document Released: 10/22/2005 Document Revised: 04/22/2012 Document Reviewed: 03/12/2011 [facility] Patient Information 2014 [place], [place]. Muscle Strain A muscle strain is an injury that occurs when a muscle is stretched beyond its normal length. Usually a small number of muscle fibers are torn when this happens. Muscle strain is rated in degrees. First-degree strains have the least amount of muscle fiber tearing and pain. Second-degree and third-degree strains have increasingly more tearing and pain.  Usually, recovery from muscle strain takes 1 2 weeks. Complete healing takes 5 6 weeks.  CAUSES  Muscle strain happens when a sudden, violent force placed on a muscle stretches it too far. This may occur with lifting, sports, or a fall.  RISK FACTORS Muscle strain is especially common in athletes.  SIGNS AND SYMPTOMS At the site of the muscle strain, there may be:  Pain.  Bruising.  Swelling.  Difficulty using the muscle due to pain or lack of normal function. DIAGNOSIS  Your health care provider will perform a physical exam and ask about your medical history. TREATMENT  Often, the best treatment for a muscle strain is resting, icing, and applying cold compresses to the injured area.  HOME CARE INSTRUCTIONS   Use the PRICE method of treatment to promote muscle healing during the first 2 3 days after your injury. The PRICE method involves:  Protecting the muscle from being injured again.  Restricting your activity and resting the injured body part.  Icing your injury. To do this, put ice in a plastic bag. Place a towel between your skin and the bag. Then, apply the ice and leave it on from 15 20 minutes each hour. After the third day, switch to moist heat  packs.  Apply compression to the injured area with a splint or elastic bandage. Be careful not to wrap it too tightly. This may interfere with blood circulation or increase swelling.  Elevate the injured body part above the level of your heart as often as you can.  Only take over-the-counter or prescription medicines for pain, discomfort, or fever as directed by your health care provider.  Warming up prior to exercise helps to prevent future muscle strains. SEEK MEDICAL CARE IF:   You have increasing pain or swelling in the injured area.  You have numbness, tingling, or a significant loss of strength in the injured area. MAKE SURE YOU:   Understand these instructions.  Will watch your condition.  Will get help right away if you are not doing well or get worse. Document Released: 10/22/2005 Document Revised: 08/12/2013 Document Reviewed: 05/21/2013 [facility] Patient Information 2014 [place], [place]. Tension Headache A tension headache is pain, pressure, or aching felt over the front and sides of the head. Tension headaches often come after  stress, feeling worried (anxiety), or feeling sad or down for a while (depressed). HOME CARE  Only take medicine as told by your doctor.  Lie down in a dark, quiet room when you have a headache.  Keep a journal to find out if certain things bring on headaches. For example, write down:  What you eat and drink.  How much sleep you get.  Any change to your diet or medicines.  Relax by getting a massage or doing other relaxing activities.  Put ice or heat packs on the head and neck area as told by your doctor.  Lessen stress.  Sit up straight. Do not tighten (tense) your muscles.  Quit smoking if you smoke.  Lessen how much alcohol you drink.  Lessen how much caffeine you drink, or stop drinking caffeine.  Eat and exercise regularly.  Get enough sleep.  Avoid using too much pain medicine. GET HELP RIGHT AWAY IF:   Your headache  becomes really bad.  You have a fever.  You have a stiff neck.  You have trouble seeing.  Your muscles are weak, or you lose muscle control.  You lose your balance or have trouble walking.  You feel like you will pass out (faint), or you pass out.  You have really bad symptoms that are different than your first symptoms.  You have problems with the medicines given to you by your doctor.  Your medicines do not work.  Your headache feels different than the other headaches.  You feel sick to your stomach (nauseous) or throw up (vomit). MAKE SURE YOU:   Understand these instructions.  Will watch your condition.  Will get help right away if you are not doing well or get worse. Document Released: 01/16/2010 Document Revised: 01/14/2012 Document Reviewed: 10/12/2011 [facility] Patient Information 2014 [place], [place].
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
## 2014-03-27 NOTE — ED Provider Notes (Signed)
Medical screening examination/treatment/procedure(s) were performed by non-physician practitioner and as supervising physician I was immediately available for consultation/collaboration.  Neta Ehlers, MD 03/27/14 [phone]

## 2014-04-04 ENCOUNTER — Emergency Department (INDEPENDENT_AMBULATORY_CARE_PROVIDER_SITE_OTHER)
Admission: EM | Admit: 2014-04-04 | Discharge: 2014-04-04 | Disposition: A | Discharge status: Home / Self Care | Payer: Medicaid Other | Source: Home / Self Care | Attending: Family Medicine | Admitting: Family Medicine

## 2014-04-04 ENCOUNTER — Encounter (HOSPITAL_COMMUNITY): Payer: Self-pay | Admitting: Emergency Medicine

## 2014-04-04 DIAGNOSIS — J069 Acute upper respiratory infection, unspecified: Secondary | ICD-10-CM

## 2014-04-04 MED ORDER — FLUTICASONE PROPIONATE 50 MCG/ACT NA SUSP: 1.0000 | Freq: Two times a day (BID) | NASAL | Status: DC

## 2014-04-04 MED ORDER — AZITHROMYCIN 250 MG PO TABS: ORAL_TABLET | ORAL | Status: DC

## 2014-04-04 MED ORDER — DEXTROMETHORPHAN POLISTIREX 30 MG/5ML PO LQCR: 60.0000 mg | Freq: Two times a day (BID) | ORAL | Status: DC

## 2014-04-04 NOTE — Discharge Instructions (Signed)
Drink plenty of fluids as discussed, use medicine as prescribed, and mucinex or delsym for cough. Return or see your doctor if further problems °

## 2014-04-04 NOTE — ED Notes (Signed)
Cough, stuffiness, headache

## 2014-04-04 NOTE — ED Provider Notes (Signed)
CSN: [id number]     Arrival date & time 04/04/14  1659 History   First MD Initiated Contact with Patient 04/04/14 1713     No chief complaint on file.  (Consider location/radiation/quality/duration/timing/severity/associated sxs/prior Treatment) Patient is a 58 y.o. female presenting with URI. The history is provided by the patient.  URI Presenting symptoms: congestion, cough, ear pain and rhinorrhea   Presenting symptoms: no fever   Severity:  Mild Onset quality:  Gradual Duration:  4 days Progression:  Unchanged Chronicity:  New Associated symptoms: no wheezing     Past Medical History  Diagnosis Date  . GERD (gastroesophageal reflux disease)   . Colon polyp   . Asthma   . CAD (coronary artery disease)   . Diabetes mellitus   . Hypertension   . Osteoarthritis   . Microcytosis   . Sleep disorder   . Obesity   . Depression 11/07    Hospitalization required  . Cataract   . Diverticulosis   . Esophageal stricture   . Hemorrhoids   . IBS (irritable bowel syndrome)    Past Surgical History  Procedure Laterality Date  . Abdominal hysterectomy    . Eye surgery    . Upper gastrointestinal endoscopy    . Colonoscopy    . Abdominal hysterectomy     Family History  Problem Relation Age of Onset  . Rectal cancer Sister   . Lung cancer Father   . Esophageal cancer Father   . Breast cancer Sister 54  . Breast cancer Mother 65  . Colon cancer Other     Aunt, Uncle  . Liver cancer Brother     Nephew  . Diabetes Sister   . Diabetes Brother   . Diabetes Other     son & daughter  . Heart disease Sister   . Heart disease Brother   . Kidney disease Sister   . Breast cancer Maternal Aunt     patient reports 11 mat aunts with breast cancer  . Breast cancer Paternal Aunt 85  . Breast cancer Maternal Grandmother     unk age   History  Substance Use Topics  . Smoking status: Never Smoker   . Smokeless tobacco: Never Used  . Alcohol Use: No   OB History   Grav Para  Term Preterm Abortions TAB SAB Ect Mult Living                 Review of Systems  Constitutional: Negative.  Negative for fever.  HENT: Positive for congestion, ear pain and rhinorrhea.   Respiratory: Positive for cough. Negative for wheezing.     Allergies  Latex and Percocet  Home Medications   Prior to Admission medications   Medication Sig Start Date End Date Taking? Authorizing Provider  albuterol (PROVENTIL,VENTOLIN) 90 MCG/ACT inhaler Inhale 2 puffs into the lungs every 6 (six) hours as needed for shortness of breath. Wheezing or shortness of breath    Historical Provider, MD  amLODipine (NORVASC) 5 MG tablet Take 1 tablet (5 mg total) by mouth daily. 12/09/13   Harden Mo, MD  aspirin 81 MG tablet Take 81 mg by mouth daily.      Historical Provider, MD  azithromycin (ZITHROMAX Z-PAK) 250 MG tablet Take as directed on pack 04/04/14   Billy Fischer, MD  dextromethorphan (DELSYM) 30 MG/5ML liquid Take 10 mLs (60 mg total) by mouth 2 (two) times daily. For cough 04/04/14   Billy Fischer, MD  diclofenac sodium (VOLTAREN)  1 % GEL Apply 2 g topically 4 (four) times daily.    Historical Provider, MD  dicyclomine (BENTYL) 10 MG capsule Take 1 tab 3 times a day before meals. 05/22/11   Amy S Esterwood, PA-C  ergocalciferol (VITAMIN D2) 50000 UNITS capsule Take 50,000 Units by mouth once a week. Usually takes on Saturday or fridays    Historical Provider, MD  famotidine (PEPCID) 40 MG tablet Take 40 mg by mouth daily.    Historical Provider, MD  fenofibrate (TRICOR) 145 MG tablet Take 145 mg by mouth daily.      Historical Provider, MD  ferrous sulfate 325 (65 FE) MG tablet Take 325 mg by mouth daily with breakfast.      Historical Provider, MD  fluticasone (FLONASE) 50 MCG/ACT nasal spray Place 1 spray into both nostrils 2 (two) times daily. 04/04/14   Billy Fischer, MD  fluticasone-salmeterol (ADVAIR HFA) [phone] MCG/ACT inhaler Inhale 2 puffs into the lungs 2 (two) times daily.     Historical Provider, MD  HYDROcodone-acetaminophen (VICODIN) 5-500 MG per tablet Take 1 tablet by mouth every 6 (six) hours as needed.      Historical Provider, MD  hyoscyamine (LEVBID) 0.375 MG 12 hr tablet Take 0.375 mg by mouth every 12 (twelve) hours as needed for cramping.    Historical Provider, MD  insulin NPH-regular Human (NOVOLIN 70/30) (70-30) 100 UNIT/ML injection Inject 20 Units into the skin 2 (two) times daily with a meal. 03/11/14   [name], MD  Insulin Syringes, Disposable, U-100 1 ML MISC by Does not apply route.      Historical Provider, MD  ipratropium (ATROVENT) 0.02 % nebulizer solution Take 0.5 mg by nebulization 3 (three) times daily as needed for wheezing or shortness of breath.    Historical Provider, MD  Lancets MISC by Does not apply route.      Historical Provider, MD  loratadine (CLARITIN) 10 MG tablet Take 10 mg by mouth daily.     Historical Provider, MD  metFORMIN (GLUCOPHAGE) 500 MG tablet Take 1 tablet (500 mg total) by mouth 2 (two) times daily with a meal. 03/11/14   [name], MD  methocarbamol (ROBAXIN) 500 MG tablet Take 1 tablet (500 mg total) by mouth 2 (two) times daily. 03/22/14   Antonietta Breach, PA-C  metoprolol (LOPRESSOR) 50 MG tablet Take 50 mg by mouth 2 (two) times daily.      Historical Provider, MD  NON FORMULARY [employer] test strip( Glucose Blood)     Historical Provider, MD  potassium chloride (K-DUR) 10 MEQ tablet Take 1 tablet (10 mEq total) by mouth 3 (three) times daily. 12/09/13   Harden Mo, MD  promethazine (PHENERGAN) 25 MG tablet Take 0.5 tablets (12.5 mg total) by mouth every 6 (six) hours as needed for nausea or vomiting. 03/11/14   [name], MD  simvastatin (ZOCOR) 40 MG tablet Take 0.5 tablets (20 mg total) by mouth at bedtime. 03/11/14   [name], MD  tobramycin (TOBREX) 0.3 % ophthalmic ointment Place 1 application into both eyes 4 (four) times daily.     Historical Provider, MD  traMADol (ULTRAM) 50 MG tablet Take 50  mg by mouth every 6 (six) hours as needed for moderate pain.    Historical Provider, MD  travoprost, benzalkonium, (TRAVATAN) 0.004 % ophthalmic solution Place 1 drop into both eyes at bedtime.      Historical Provider, MD   BP 190/114  Pulse 93  Temp(Src) 98.8  F (37.1 C) (Oral)  Resp 18  SpO2 100% Physical Exam  Nursing note and vitals reviewed. Constitutional: She is oriented to person, place, and time. She appears well-developed and well-nourished.  HENT:  Right Ear: External ear normal.  Left Ear: External ear normal.  Mouth/Throat: Oropharynx is clear and moist.  Eyes: Conjunctivae are normal. Pupils are equal, round, and reactive to light.  Neck: Normal range of motion. Neck supple.  Cardiovascular: Regular rhythm, normal heart sounds and intact distal pulses.   Pulmonary/Chest: Effort normal and breath sounds normal.  Lymphadenopathy:    She has no cervical adenopathy.  Neurological: She is alert and oriented to person, place, and time.  Skin: Skin is warm and dry.    ED Course  Procedures (including critical care time) Labs Review Labs Reviewed - No data to display  Imaging Review No results found.   MDM   1. URI (upper respiratory infection)        Billy Fischer, MD 04/04/14 1730

## 2014-08-06 ENCOUNTER — Encounter: Payer: Self-pay | Admitting: Gastroenterology

## 2014-10-20 ENCOUNTER — Other Ambulatory Visit: Payer: Self-pay | Admitting: Internal Medicine

## 2014-10-20 DIAGNOSIS — N6001 Solitary cyst of right breast: Secondary | ICD-10-CM

## 2014-10-20 DIAGNOSIS — E2839 Other primary ovarian failure: Secondary | ICD-10-CM

## 2014-10-25 ENCOUNTER — Ambulatory Visit (HOSPITAL_COMMUNITY)
Admission: RE | Admit: 2014-10-25 | Discharge: 2014-10-25 | Disposition: A | Discharge status: Home / Self Care | Payer: Medicaid Other | Source: Ambulatory Visit | Attending: Internal Medicine | Admitting: Internal Medicine

## 2014-10-25 DIAGNOSIS — E2839 Other primary ovarian failure: Secondary | ICD-10-CM | POA: Insufficient documentation

## 2014-10-25 DIAGNOSIS — Z13820 Encounter for screening for osteoporosis: Secondary | ICD-10-CM | POA: Insufficient documentation

## 2014-11-04 ENCOUNTER — Other Ambulatory Visit: Payer: Medicaid Other

## 2014-11-16 ENCOUNTER — Ambulatory Visit
Admission: RE | Admit: 2014-11-16 | Discharge: 2014-11-16 | Disposition: A | Discharge status: Home / Self Care | Payer: Medicaid Other | Source: Ambulatory Visit | Attending: Internal Medicine | Admitting: Internal Medicine

## 2014-11-16 DIAGNOSIS — N6001 Solitary cyst of right breast: Secondary | ICD-10-CM

## 2014-12-17 ENCOUNTER — Encounter: Payer: Self-pay | Admitting: Gastroenterology

## 2015-03-24 ENCOUNTER — Other Ambulatory Visit: Payer: Self-pay | Admitting: Cardiovascular Disease

## 2015-03-24 ENCOUNTER — Ambulatory Visit
Admission: RE | Admit: 2015-03-24 | Discharge: 2015-03-24 | Disposition: A | Discharge status: Home / Self Care | Payer: Medicaid Other | Source: Ambulatory Visit | Attending: Cardiovascular Disease | Admitting: Cardiovascular Disease

## 2015-03-24 DIAGNOSIS — R0602 Shortness of breath: Secondary | ICD-10-CM

## 2015-08-26 ENCOUNTER — Encounter (HOSPITAL_COMMUNITY): Payer: Self-pay

## 2015-08-26 ENCOUNTER — Inpatient Hospital Stay (HOSPITAL_COMMUNITY)
Admission: EM | Admit: 2015-08-26 | Discharge: 2015-08-28 | DRG: 682 | Disposition: A | Discharge status: Home / Self Care | Payer: Medicaid Other | Attending: Internal Medicine | Admitting: Internal Medicine

## 2015-08-26 DIAGNOSIS — K589 Irritable bowel syndrome without diarrhea: Secondary | ICD-10-CM | POA: Diagnosis present

## 2015-08-26 DIAGNOSIS — I5030 Unspecified diastolic (congestive) heart failure: Secondary | ICD-10-CM | POA: Diagnosis present

## 2015-08-26 DIAGNOSIS — N179 Acute kidney failure, unspecified: Secondary | ICD-10-CM | POA: Diagnosis present

## 2015-08-26 DIAGNOSIS — K219 Gastro-esophageal reflux disease without esophagitis: Secondary | ICD-10-CM | POA: Diagnosis present

## 2015-08-26 DIAGNOSIS — Z6841 Body Mass Index (BMI) 40.0 and over, adult: Secondary | ICD-10-CM

## 2015-08-26 DIAGNOSIS — IMO0002 Reserved for concepts with insufficient information to code with codable children: Secondary | ICD-10-CM | POA: Diagnosis present

## 2015-08-26 DIAGNOSIS — E1165 Type 2 diabetes mellitus with hyperglycemia: Secondary | ICD-10-CM | POA: Diagnosis present

## 2015-08-26 DIAGNOSIS — E860 Dehydration: Secondary | ICD-10-CM

## 2015-08-26 DIAGNOSIS — T40605A Adverse effect of unspecified narcotics, initial encounter: Secondary | ICD-10-CM | POA: Diagnosis present

## 2015-08-26 DIAGNOSIS — R262 Difficulty in walking, not elsewhere classified: Secondary | ICD-10-CM | POA: Diagnosis present

## 2015-08-26 DIAGNOSIS — D509 Iron deficiency anemia, unspecified: Secondary | ICD-10-CM | POA: Diagnosis present

## 2015-08-26 DIAGNOSIS — I2510 Atherosclerotic heart disease of native coronary artery without angina pectoris: Secondary | ICD-10-CM | POA: Diagnosis present

## 2015-08-26 DIAGNOSIS — N170 Acute kidney failure with tubular necrosis: Secondary | ICD-10-CM | POA: Diagnosis not present

## 2015-08-26 DIAGNOSIS — Z86010 Personal history of colonic polyps: Secondary | ICD-10-CM | POA: Diagnosis not present

## 2015-08-26 DIAGNOSIS — N189 Chronic kidney disease, unspecified: Secondary | ICD-10-CM | POA: Insufficient documentation

## 2015-08-26 DIAGNOSIS — J45909 Unspecified asthma, uncomplicated: Secondary | ICD-10-CM | POA: Diagnosis present

## 2015-08-26 DIAGNOSIS — M1990 Unspecified osteoarthritis, unspecified site: Secondary | ICD-10-CM | POA: Diagnosis present

## 2015-08-26 DIAGNOSIS — I959 Hypotension, unspecified: Secondary | ICD-10-CM | POA: Diagnosis present

## 2015-08-26 DIAGNOSIS — G92 Toxic encephalopathy: Secondary | ICD-10-CM | POA: Diagnosis present

## 2015-08-26 DIAGNOSIS — I110 Hypertensive heart disease with heart failure: Secondary | ICD-10-CM | POA: Diagnosis present

## 2015-08-26 DIAGNOSIS — Z79891 Long term (current) use of opiate analgesic: Secondary | ICD-10-CM | POA: Diagnosis not present

## 2015-08-26 DIAGNOSIS — I4581 Long QT syndrome: Secondary | ICD-10-CM | POA: Diagnosis not present

## 2015-08-26 DIAGNOSIS — R4182 Altered mental status, unspecified: Secondary | ICD-10-CM | POA: Diagnosis present

## 2015-08-26 DIAGNOSIS — G479 Sleep disorder, unspecified: Secondary | ICD-10-CM | POA: Diagnosis present

## 2015-08-26 DIAGNOSIS — G4733 Obstructive sleep apnea (adult) (pediatric): Secondary | ICD-10-CM | POA: Diagnosis present

## 2015-08-26 DIAGNOSIS — G8929 Other chronic pain: Secondary | ICD-10-CM | POA: Diagnosis present

## 2015-08-26 DIAGNOSIS — R739 Hyperglycemia, unspecified: Secondary | ICD-10-CM | POA: Diagnosis present

## 2015-08-26 DIAGNOSIS — G9340 Encephalopathy, unspecified: Secondary | ICD-10-CM

## 2015-08-26 DIAGNOSIS — Z794 Long term (current) use of insulin: Secondary | ICD-10-CM | POA: Diagnosis not present

## 2015-08-26 DIAGNOSIS — I10 Essential (primary) hypertension: Secondary | ICD-10-CM | POA: Diagnosis not present

## 2015-08-26 DIAGNOSIS — E662 Morbid (severe) obesity with alveolar hypoventilation: Secondary | ICD-10-CM | POA: Diagnosis present

## 2015-08-26 DIAGNOSIS — R9431 Abnormal electrocardiogram [ECG] [EKG]: Secondary | ICD-10-CM | POA: Diagnosis present

## 2015-08-26 DIAGNOSIS — K580 Irritable bowel syndrome with diarrhea: Secondary | ICD-10-CM | POA: Diagnosis present

## 2015-08-26 DIAGNOSIS — M546 Pain in thoracic spine: Secondary | ICD-10-CM | POA: Diagnosis not present

## 2015-08-26 DIAGNOSIS — M549 Dorsalgia, unspecified: Secondary | ICD-10-CM | POA: Diagnosis present

## 2015-08-26 HISTORY — DX: Adverse effect of unspecified anesthetic, initial encounter: T41.45XA

## 2015-08-26 HISTORY — DX: Other chronic pain: G89.29

## 2015-08-26 HISTORY — DX: Dependence on other enabling machines and devices: Z99.89

## 2015-08-26 HISTORY — DX: Other complications of anesthesia, initial encounter: T88.59XA

## 2015-08-26 HISTORY — DX: Anxiety disorder, unspecified: F41.9

## 2015-08-26 HISTORY — DX: Gout, unspecified: M10.9

## 2015-08-26 HISTORY — DX: Unspecified chronic bronchitis: J42

## 2015-08-26 HISTORY — DX: Personal history of other diseases of the digestive system: Z87.19

## 2015-08-26 HISTORY — DX: Acute myocardial infarction, unspecified: I21.9

## 2015-08-26 HISTORY — DX: Low back pain: M54.5

## 2015-08-26 HISTORY — DX: Type 2 diabetes mellitus without complications: E11.9

## 2015-08-26 HISTORY — DX: Obstructive sleep apnea (adult) (pediatric): G47.33

## 2015-08-26 HISTORY — DX: Headache: R51

## 2015-08-26 HISTORY — DX: Hyperlipidemia, unspecified: E78.5

## 2015-08-26 HISTORY — DX: Pneumonia, unspecified organism: J18.9

## 2015-08-26 HISTORY — DX: Headache, unspecified: R51.9

## 2015-08-26 HISTORY — DX: Low back pain, unspecified: M54.50

## 2015-08-26 HISTORY — DX: Sickle-cell trait: D57.3

## 2015-08-26 LAB — COMPREHENSIVE METABOLIC PANEL
ALK PHOS: 77 U/L (ref 38–126)
ALT: 18 U/L (ref 14–54)
ANION GAP: 10 (ref 5–15)
AST: 17 U/L (ref 15–41)
Albumin: 3.2 g/dL — ABNORMAL LOW (ref 3.5–5.0)
BILIRUBIN TOTAL: 0.4 mg/dL (ref 0.3–1.2)
BUN: 15 mg/dL (ref 6–20)
CHLORIDE: 96 mmol/L — AB (ref 101–111)
CO2: 26 mmol/L (ref 22–32)
CREATININE: 1.21 mg/dL — AB (ref 0.44–1.00)
Calcium: 8.1 mg/dL — ABNORMAL LOW (ref 8.9–10.3)
GFR calc Af Amer: 56 mL/min — ABNORMAL LOW (ref >60)
GFR calc non Af Amer: 48 mL/min — ABNORMAL LOW (ref >60)
Glucose, Bld: 468 mg/dL — ABNORMAL HIGH (ref 65–99)
Potassium: 5 mmol/L (ref 3.5–5.1)
SODIUM: 132 mmol/L — AB (ref 135–145)
Total Protein: 6.5 g/dL (ref 6.5–8.1)

## 2015-08-26 LAB — URINALYSIS, ROUTINE W REFLEX MICROSCOPIC
Bilirubin Urine: NEGATIVE
GLUCOSE, UA: NEGATIVE mg/dL
Hgb urine dipstick: NEGATIVE
KETONES UR: NEGATIVE mg/dL
NITRITE: NEGATIVE
PH: 5.5 (ref 5.0–8.0)
Protein, ur: 30 mg/dL — AB
Specific Gravity, Urine: 1.017 (ref 1.005–1.030)
Urobilinogen, UA: 0.2 mg/dL (ref 0.0–1.0)

## 2015-08-26 LAB — CBC WITH DIFFERENTIAL/PLATELET
Basophils Absolute: 0 10*3/uL (ref 0.0–0.1)
Basophils Relative: 0 %
EOS PCT: 1 %
Eosinophils Absolute: 0.1 10*3/uL (ref 0.0–0.7)
HCT: 27.5 % — ABNORMAL LOW (ref 36.0–46.0)
HEMOGLOBIN: 8.2 g/dL — AB (ref 12.0–15.0)
Lymphocytes Relative: 31 %
Lymphs Abs: 2.1 10*3/uL (ref 0.7–4.0)
MCH: 18.5 pg — AB (ref 26.0–34.0)
MCHC: 29.8 g/dL — ABNORMAL LOW (ref 30.0–36.0)
MCV: 61.9 fL — AB (ref 78.0–100.0)
MONO ABS: 0.3 10*3/uL (ref 0.1–1.0)
Monocytes Relative: 4 %
NEUTROS PCT: 64 %
Neutro Abs: 4.2 10*3/uL (ref 1.7–7.7)
PLATELETS: 255 10*3/uL (ref 150–400)
RBC: 4.44 MIL/uL (ref 3.87–5.11)
RDW: 21 % — ABNORMAL HIGH (ref 11.5–15.5)
WBC: 6.7 10*3/uL (ref 4.0–10.5)

## 2015-08-26 LAB — FERRITIN: FERRITIN: 14 ng/mL (ref 11–307)

## 2015-08-26 LAB — I-STAT CG4 LACTIC ACID, ED
Lactic Acid, Venous: 1.95 mmol/L (ref 0.5–2.0)
Lactic Acid, Venous: 1.75 mmol/L (ref 0.5–2.0)

## 2015-08-26 LAB — CREATININE, URINE, RANDOM: Creatinine, Urine: 125.74 mg/dL

## 2015-08-26 LAB — GLUCOSE, CAPILLARY
GLUCOSE-CAPILLARY: 304 mg/dL — AB (ref 65–99)
Glucose-Capillary: 352 mg/dL — ABNORMAL HIGH (ref 65–99)
Glucose-Capillary: 216 mg/dL — ABNORMAL HIGH (ref 65–99)

## 2015-08-26 LAB — TYPE AND SCREEN
ABO/RH(D): B POS
ANTIBODY SCREEN: NEGATIVE

## 2015-08-26 LAB — CBG MONITORING, ED: Glucose-Capillary: 372 mg/dL — ABNORMAL HIGH (ref 65–99)

## 2015-08-26 LAB — MAGNESIUM: Magnesium: 1.4 mg/dL — ABNORMAL LOW (ref 1.7–2.4)

## 2015-08-26 LAB — IRON AND TIBC
IRON: 20 ug/dL — AB (ref 28–170)
Saturation Ratios: 6 % — ABNORMAL LOW (ref 10.4–31.8)
TIBC: 337 ug/dL (ref 250–450)
UIBC: 317 ug/dL

## 2015-08-26 LAB — SODIUM, URINE, RANDOM: Sodium, Ur: 80 mmol/L

## 2015-08-26 LAB — URINE MICROSCOPIC-ADD ON

## 2015-08-26 LAB — MRSA PCR SCREENING: MRSA by PCR: NEGATIVE

## 2015-08-26 LAB — TROPONIN I: Troponin I: <0.03 ng/mL (ref <0.031)

## 2015-08-26 MED ORDER — ENOXAPARIN SODIUM 40 MG/0.4ML ~~LOC~~ SOLN
40.0000 mg | SUBCUTANEOUS | Status: DC
  Administered 2015-08-26: 40 mg via SUBCUTANEOUS
  Filled 2015-08-26: qty 0.4

## 2015-08-26 MED ORDER — LORATADINE 10 MG PO TABS
10.0000 mg | ORAL_TABLET | Freq: Every day | ORAL | Status: DC
  Administered 2015-08-26 – 2015-08-28 (×3): 10 mg via ORAL
  Filled 2015-08-26 (×3): qty 1

## 2015-08-26 MED ORDER — DICYCLOMINE HCL 10 MG PO CAPS
10.0000 mg | ORAL_CAPSULE | Freq: Three times a day (TID) | ORAL | Status: DC
  Administered 2015-08-26 – 2015-08-28 (×7): 10 mg via ORAL
  Filled 2015-08-26 (×6): qty 1

## 2015-08-26 MED ORDER — SODIUM CHLORIDE 0.9 % IV BOLUS (SEPSIS)
1000.0000 mL | Freq: Once | INTRAVENOUS | Status: AC
  Administered 2015-08-26: 1000 mL via INTRAVENOUS

## 2015-08-26 MED ORDER — ASPIRIN EC 81 MG PO TBEC
81.0000 mg | DELAYED_RELEASE_TABLET | Freq: Every day | ORAL | Status: DC
  Administered 2015-08-26 – 2015-08-28 (×3): 81 mg via ORAL
  Filled 2015-08-26 (×3): qty 1

## 2015-08-26 MED ORDER — HYDROCODONE-ACETAMINOPHEN 5-325 MG PO TABS
1.0000 | ORAL_TABLET | Freq: Four times a day (QID) | ORAL | Status: DC | PRN
  Administered 2015-08-26 – 2015-08-27 (×2): 1 via ORAL
  Filled 2015-08-26 (×2): qty 1

## 2015-08-26 MED ORDER — TRAMADOL HCL 50 MG PO TABS
50.0000 mg | ORAL_TABLET | Freq: Four times a day (QID) | ORAL | Status: DC | PRN
  Administered 2015-08-27: 50 mg via ORAL
  Filled 2015-08-26: qty 1

## 2015-08-26 MED ORDER — FAMOTIDINE 20 MG PO TABS
40.0000 mg | ORAL_TABLET | Freq: Every day | ORAL | Status: DC
  Administered 2015-08-26 – 2015-08-28 (×3): 40 mg via ORAL
  Filled 2015-08-26 (×3): qty 2

## 2015-08-26 MED ORDER — POLYETHYLENE GLYCOL 3350 17 G PO PACK
17.0000 g | PACK | Freq: Every day | ORAL | Status: DC | PRN
  Administered 2015-08-27: 17 g via ORAL
  Filled 2015-08-26: qty 1

## 2015-08-26 MED ORDER — SODIUM CHLORIDE 0.9 % IV SOLN: INTRAVENOUS | Status: AC

## 2015-08-26 MED ORDER — TRAVOPROST (BAK FREE) 0.004 % OP SOLN
1.0000 [drp] | Freq: Every day | OPHTHALMIC | Status: DC
  Administered 2015-08-26 – 2015-08-27 (×2): 1 [drp] via OPHTHALMIC
  Filled 2015-08-26 (×2): qty 2.5

## 2015-08-26 MED ORDER — METHOCARBAMOL 500 MG PO TABS
500.0000 mg | ORAL_TABLET | Freq: Two times a day (BID) | ORAL | Status: DC
Start: 2015-08-26 — End: 2015-08-28
  Administered 2015-08-26 – 2015-08-28 (×4): 500 mg via ORAL
  Filled 2015-08-26 (×4): qty 1

## 2015-08-26 MED ORDER — INSULIN ASPART 100 UNIT/ML ~~LOC~~ SOLN: 10.0000 [IU] | Freq: Once | SUBCUTANEOUS | Status: DC

## 2015-08-26 MED ORDER — IPRATROPIUM BROMIDE 0.02 % IN SOLN: 0.5000 mg | Freq: Three times a day (TID) | RESPIRATORY_TRACT | Status: DC | PRN

## 2015-08-26 MED ORDER — FENTANYL CITRATE (PF) 100 MCG/2ML IJ SOLN
12.5000 ug | INTRAMUSCULAR | Status: DC | PRN
  Administered 2015-08-26: 12.5 ug via INTRAVENOUS
  Filled 2015-08-26: qty 2

## 2015-08-26 MED ORDER — SODIUM CHLORIDE 0.9 % IV SOLN
INTRAVENOUS | Status: DC
  Administered 2015-08-26: 16:00:00 via INTRAVENOUS
  Administered 2015-08-27: 1000 mL via INTRAVENOUS

## 2015-08-26 MED ORDER — FLUTICASONE PROPIONATE 50 MCG/ACT NA SUSP
1.0000 | Freq: Two times a day (BID) | NASAL | Status: DC
  Administered 2015-08-26 – 2015-08-28 (×4): 1 via NASAL
  Filled 2015-08-26: qty 16

## 2015-08-26 MED ORDER — MOMETASONE FURO-FORMOTEROL FUM 100-5 MCG/ACT IN AERO
2.0000 | INHALATION_SPRAY | Freq: Two times a day (BID) | RESPIRATORY_TRACT | Status: DC
  Administered 2015-08-26 – 2015-08-28 (×3): 2 via RESPIRATORY_TRACT
  Filled 2015-08-26: qty 8.8

## 2015-08-26 MED ORDER — INSULIN ASPART PROT & ASPART (70-30 MIX) 100 UNIT/ML ~~LOC~~ SUSP
40.0000 [IU] | Freq: Two times a day (BID) | SUBCUTANEOUS | Status: DC
  Administered 2015-08-26 – 2015-08-28 (×4): 40 [IU] via SUBCUTANEOUS
  Filled 2015-08-26: qty 10

## 2015-08-26 MED ORDER — AMLODIPINE BESYLATE 5 MG PO TABS
5.0000 mg | ORAL_TABLET | Freq: Every day | ORAL | Status: DC
  Administered 2015-08-27 – 2015-08-28 (×2): 5 mg via ORAL
  Filled 2015-08-26 (×2): qty 1

## 2015-08-26 MED ORDER — INSULIN ASPART 100 UNIT/ML ~~LOC~~ SOLN
0.0000 [IU] | SUBCUTANEOUS | Status: DC
  Administered 2015-08-26: 9 [IU] via SUBCUTANEOUS
  Administered 2015-08-26 – 2015-08-27 (×2): 7 [IU] via SUBCUTANEOUS
  Administered 2015-08-27: 2 [IU] via SUBCUTANEOUS
  Administered 2015-08-27 (×2): 5 [IU] via SUBCUTANEOUS
  Administered 2015-08-27: 7 [IU] via SUBCUTANEOUS
  Administered 2015-08-28 (×4): 3 [IU] via SUBCUTANEOUS

## 2015-08-26 MED ORDER — METOPROLOL TARTRATE 50 MG PO TABS
50.0000 mg | ORAL_TABLET | Freq: Two times a day (BID) | ORAL | Status: DC
  Administered 2015-08-26 – 2015-08-28 (×4): 50 mg via ORAL
  Filled 2015-08-26 (×4): qty 1

## 2015-08-26 MED ORDER — ENOXAPARIN SODIUM 80 MG/0.8ML ~~LOC~~ SOLN
70.0000 mg | SUBCUTANEOUS | Status: DC
  Administered 2015-08-27: 70 mg via SUBCUTANEOUS
  Filled 2015-08-26 (×2): qty 0.8

## 2015-08-26 MED ORDER — ALBUTEROL SULFATE (2.5 MG/3ML) 0.083% IN NEBU: 2.5000 mg | INHALATION_SOLUTION | Freq: Four times a day (QID) | RESPIRATORY_TRACT | Status: DC | PRN

## 2015-08-26 MED ORDER — SIMVASTATIN 20 MG PO TABS
20.0000 mg | ORAL_TABLET | Freq: Every day | ORAL | Status: DC
  Administered 2015-08-26 – 2015-08-27 (×2): 20 mg via ORAL
  Filled 2015-08-26 (×2): qty 1

## 2015-08-26 MED ORDER — FENOFIBRATE 54 MG PO TABS
54.0000 mg | ORAL_TABLET | Freq: Every day | ORAL | Status: DC
  Administered 2015-08-27 – 2015-08-28 (×2): 54 mg via ORAL
  Filled 2015-08-26 (×3): qty 1
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
## 2015-08-26 NOTE — ED Notes (Signed)
Attempted to obtain stool sample. Unable to obtain. PA Josh aware.

## 2015-08-26 NOTE — ED Notes (Signed)
Admitting MD at bedside.

## 2015-08-26 NOTE — ED Provider Notes (Signed)
CSN: [id number]     Arrival date & time 08/26/15  1048 History   First MD Initiated Contact with Patient 08/26/15 1049     Chief Complaint  Patient presents with  . Hyperglycemia  . Altered Mental Status     (Consider location/radiation/quality/duration/timing/severity/associated sxs/prior Treatment) HPI Comments: Patient with history of insulin-dependent diabetes also on metformin, on beta blocker, echocardiogram in 2015 showing mild systolic dysfunction and grade 1 diastolic dysfunction with an EF of 45-50% -- presents with high blood sugar. She has had associated nausea, vomiting, diarrhea over the past 1 week. She denies fever, cough. Blood sugar was found to be in the 400s today. Patient has been very tired per the family but answering questions appropriately. EMS was called to health today for lethargy and was found to be hypotensive with a blood pressure of 88/60. Patient did receive some fluids en route. She denies chest pain. The onset of this condition was acute. The course is constant. Aggravating factors: none. Alleviating factors: none.    Patient is a 59 y.o. female presenting with hyperglycemia and altered mental status. The history is provided by the patient and medical records.  Hyperglycemia Associated symptoms: abdominal pain, altered mental status and nausea   Associated symptoms: no chest pain, no dysuria, no fever and no vomiting   Altered Mental Status Associated symptoms: abdominal pain and nausea   Associated symptoms: no fever, no headaches, no rash and no vomiting     Past Medical History  Diagnosis Date  . GERD (gastroesophageal reflux disease)   . Colon polyp   . Asthma   . CAD (coronary artery disease)   . Diabetes mellitus   . Hypertension   . Osteoarthritis   . Microcytosis   . Sleep disorder   . Obesity   . Depression 11/07    Hospitalization required  . Cataract   . Diverticulosis   . Esophageal stricture   . Hemorrhoids   . IBS (irritable  bowel syndrome)    Past Surgical History  Procedure Laterality Date  . Abdominal hysterectomy    . Eye surgery    . Upper gastrointestinal endoscopy    . Colonoscopy    . Abdominal hysterectomy     Family History  Problem Relation Age of Onset  . Rectal cancer Sister   . Lung cancer Father   . Esophageal cancer Father   . Breast cancer Sister 5  . Breast cancer Mother 20  . Colon cancer Other     Aunt, Uncle  . Liver cancer Brother     Nephew  . Diabetes Sister   . Diabetes Brother   . Diabetes Other     son & daughter  . Heart disease Sister   . Heart disease Brother   . Kidney disease Sister   . Breast cancer Maternal Aunt     patient reports 11 mat aunts with breast cancer  . Breast cancer Paternal Aunt 85  . Breast cancer Maternal Grandmother     unk age   Social History  Substance Use Topics  . Smoking status: Never Smoker   . Smokeless tobacco: Never Used  . Alcohol Use: No   OB History    No data available     Review of Systems  Constitutional: Negative for fever.  HENT: Negative for rhinorrhea and sore throat.   Eyes: Negative for redness.  Respiratory: Negative for cough.   Cardiovascular: Negative for chest pain.  Gastrointestinal: Positive for nausea, abdominal pain and diarrhea.  Negative for vomiting.  Genitourinary: Negative for dysuria.  Musculoskeletal: Negative for myalgias.  Skin: Negative for rash.  Neurological: Negative for headaches.   Allergies  Latex and Percocet  Home Medications   Prior to Admission medications   Medication Sig Start Date End Date Taking? Authorizing Provider  albuterol (PROVENTIL,VENTOLIN) 90 MCG/ACT inhaler Inhale 2 puffs into the lungs every 6 (six) hours as needed for shortness of breath. Wheezing or shortness of breath    Historical Provider, MD  amLODipine (NORVASC) 5 MG tablet Take 1 tablet (5 mg total) by mouth daily. 12/09/13   Harden Mo, MD  aspirin 81 MG tablet Take 81 mg by mouth daily.       Historical Provider, MD  azithromycin (ZITHROMAX Z-PAK) 250 MG tablet Take as directed on pack 04/04/14   Billy Fischer, MD  dextromethorphan (DELSYM) 30 MG/5ML liquid Take 10 mLs (60 mg total) by mouth 2 (two) times daily. For cough 04/04/14   Billy Fischer, MD  diclofenac sodium (VOLTAREN) 1 % GEL Apply 2 g topically 4 (four) times daily.    Historical Provider, MD  dicyclomine (BENTYL) 10 MG capsule Take 1 tab 3 times a day before meals. 05/22/11   Amy S Esterwood, PA-C  ergocalciferol (VITAMIN D2) 50000 UNITS capsule Take 50,000 Units by mouth once a week. Usually takes on Saturday or fridays    Historical Provider, MD  famotidine (PEPCID) 40 MG tablet Take 40 mg by mouth daily.    Historical Provider, MD  fenofibrate (TRICOR) 145 MG tablet Take 145 mg by mouth daily.      Historical Provider, MD  ferrous sulfate 325 (65 FE) MG tablet Take 325 mg by mouth daily with breakfast.      Historical Provider, MD  fluticasone (FLONASE) 50 MCG/ACT nasal spray Place 1 spray into both nostrils 2 (two) times daily. 04/04/14   Billy Fischer, MD  fluticasone-salmeterol (ADVAIR HFA) [phone] MCG/ACT inhaler Inhale 2 puffs into the lungs 2 (two) times daily.    Historical Provider, MD  HYDROcodone-acetaminophen (VICODIN) 5-500 MG per tablet Take 1 tablet by mouth every 6 (six) hours as needed.      Historical Provider, MD  hyoscyamine (LEVBID) 0.375 MG 12 hr tablet Take 0.375 mg by mouth every 12 (twelve) hours as needed for cramping.    Historical Provider, MD  insulin NPH-regular Human (NOVOLIN 70/30) (70-30) 100 UNIT/ML injection Inject 20 Units into the skin 2 (two) times daily with a meal. 03/11/14   Dixie Dials, MD  Insulin Syringes, Disposable, U-100 1 ML MISC by Does not apply route.      Historical Provider, MD  ipratropium (ATROVENT) 0.02 % nebulizer solution Take 0.5 mg by nebulization 3 (three) times daily as needed for wheezing or shortness of breath.    Historical Provider, MD  Lancets MISC by Does not  apply route.      Historical Provider, MD  loratadine (CLARITIN) 10 MG tablet Take 10 mg by mouth daily.     Historical Provider, MD  metFORMIN (GLUCOPHAGE) 500 MG tablet Take 1 tablet (500 mg total) by mouth 2 (two) times daily with a meal. 03/11/14   Dixie Dials, MD  methocarbamol (ROBAXIN) 500 MG tablet Take 1 tablet (500 mg total) by mouth 2 (two) times daily. 03/22/14   Antonietta Breach, PA-C  metoprolol (LOPRESSOR) 50 MG tablet Take 50 mg by mouth 2 (two) times daily.      Historical Provider, MD  NON FORMULARY [employer] test strip( Glucose  Blood)     Historical Provider, MD  potassium chloride (K-DUR) 10 MEQ tablet Take 1 tablet (10 mEq total) by mouth 3 (three) times daily. 12/09/13   Harden Mo, MD  promethazine (PHENERGAN) 25 MG tablet Take 0.5 tablets (12.5 mg total) by mouth every 6 (six) hours as needed for nausea or vomiting. 03/11/14   Dixie Dials, MD  simvastatin (ZOCOR) 40 MG tablet Take 0.5 tablets (20 mg total) by mouth at bedtime. 03/11/14   Dixie Dials, MD  tobramycin (TOBREX) 0.3 % ophthalmic ointment Place 1 application into both eyes 4 (four) times daily.     Historical Provider, MD  traMADol (ULTRAM) 50 MG tablet Take 50 mg by mouth every 6 (six) hours as needed for moderate pain.    Historical Provider, MD  travoprost, benzalkonium, (TRAVATAN) 0.004 % ophthalmic solution Place 1 drop into both eyes at bedtime.      Historical Provider, MD   BP 97/55 mmHg  Pulse 68  Temp(Src) 97.8 F (36.6 C) (Oral)  Resp 15  Ht 5\' 6"  (1.676 m)  Wt 350 lb (158.759 kg)  BMI 56.52 kg/m2  SpO2 100%   Physical Exam  Constitutional: She appears well-developed and well-nourished.  HENT:  Head: Normocephalic and atraumatic.  Right Ear: External ear normal.  Left Ear: External ear normal.  Nose: Nose normal.  Mouth/Throat: Mucous membranes are dry.  Eyes: Conjunctivae are normal. Right eye exhibits no discharge. Left eye exhibits no discharge.  Neck: Normal range of motion. Neck supple.   Cardiovascular: Normal rate, regular rhythm and normal heart sounds.   No murmur heard. Pulmonary/Chest: Effort normal and breath sounds normal. No respiratory distress. She has no wheezes. She has no rales.  Abdominal: Soft. Bowel sounds are normal. She exhibits no distension. There is tenderness (Mild generalized tenderness, nonfocal, no rebound or guarding). There is no rebound and no guarding.  Musculoskeletal: She exhibits no edema or tenderness.  Neurological: She is alert.  Skin: Skin is warm and dry.  Psychiatric: She has a normal mood and affect.  Nursing note and vitals reviewed.   ED Course  Procedures (including critical care time) Labs Review Labs Reviewed  COMPREHENSIVE METABOLIC PANEL - Abnormal; Notable for the following:    Sodium 132 (*)    Chloride 96 (*)    Glucose, Bld 468 (*)    Creatinine, Ser 1.21 (*)    Calcium 8.1 (*)    Albumin 3.2 (*)    GFR calc non Af Amer 48 (*)    GFR calc Af Amer 56 (*)    All other components within normal limits  CBC WITH DIFFERENTIAL/PLATELET - Abnormal; Notable for the following:    Hemoglobin 8.2 (*)    HCT 27.5 (*)    MCV 61.9 (*)    MCH 18.5 (*)    MCHC 29.8 (*)    RDW 21.0 (*)    All other components within normal limits  CULTURE, BLOOD (ROUTINE X 2)  CULTURE, BLOOD (ROUTINE X 2)  URINE CULTURE  URINALYSIS, ROUTINE W REFLEX MICROSCOPIC (NOT AT [facility])  TROPONIN I  OCCULT BLOOD X 1 CARD TO LAB, STOOL  I-STAT CG4 LACTIC ACID, ED  CBG MONITORING, ED  TYPE AND SCREEN    Imaging Review No results found. I have personally reviewed and evaluated these images and lab results as part of my medical decision-making.   EKG Interpretation None       11:06 AM Patient seen and examined. Work-up initiated. Medications ordered.  Vital signs reviewed and are as follows: BP 97/55 mmHg  Pulse 68  Temp(Src) 97.8 F (36.6 C) (Oral)  Resp 15  Ht 5\' 6"  (1.676 m)  Wt 350 lb (158.759 kg)  BMI 56.52 kg/m2  SpO2  100%  11:53 AM EKG non-ischemic, HR 70's.   1:13 PM Patient stable. Discussed with Dr. Betsey Holiday.   Given hypotension, dehydration, unclear degree of anemia -- will admit.   CXR and UA pending.   Nurse attempted hemoccult, no stool in rectal vault, no gross bleeding.   1:32 PM Admitted to [facility].   MDM   Final diagnoses:  Hyperglycemia  Dehydration   Admit.     Carlisle Cater, PA-C 08/26/15 [place], MD 08/26/15 [phone]

## 2015-08-26 NOTE — H&P (Addendum)
[facility] History and Physical  Chelsea Lester [id number]:[id number] DOB: July 12, 1956 DOA: 08/26/2015  Referring physician: Emergency Department PCP: Philis Fendt, MD   CHIEF COMPLAINT:  weakness  HPI: Chelsea Lester is a 59 y.o. female with uncontrolled type 2 diabetes. Patient was sent to the emergency department from home after home health nurse found her lethargic, unable to walk. Patient has no recollection of this event. Several days ago patient had an illness with nausea, vomiting, and diarrhea which lasted about a week. She's had none of these symptoms in 3-4 days. Stools are solidified, she's been tolerating by mouth intake. Patient says her blood sugars up and running no higher than 140 at home on a daily basis. She is not urinating as much as normal. Patient does not typically have nausea, vomiting, diarrhea but she does have chronic lower abdominal pain present for many years. She takes an anticholinergic for irritable bowel syndrome. Patient takes iron at home. She's had no overt gastrointestinal blood loss. She takes a baby aspirin, no other NSAIDs  Patient has a CPAP at home but is not compliant with it. She has tried to get a nasal instead of a full face mask but has been unsuccessful in obtaining one  ED COURSE:   Vitals: Afebrile. Normotensive -better after IVF.   Pertinent labs:   WBC 6.7  hgb 8.2   BUN 15  Creatinine  1.21  CBG 468 TROP 0.03  Lactic acid 1.95  Anion Gap 10  Urinalysis:  Pending       CXR:   pending  EKG:    Sinus rhythm Abnormal R-wave progression, early transition Borderline prolonged QT interval  QT 502 Baseline wander in lead(s) V2   Medications  sodium chloride 0.9 % bolus 1,000 mL (not administered)  sodium chloride 0.9 % bolus 1,000 mL (0 mLs Intravenous Stopped 08/26/15 1310)  sodium chloride 0.9 % bolus 1,000 mL (0 mLs Intravenous Stopped 08/26/15 1219)    REVIEW OF SYSTEMS:  Review of Systems  HENT: Negative.   Eyes:  Negative.   Respiratory: Negative.   Cardiovascular: Positive for chest pain.  Gastrointestinal: Positive for nausea, vomiting, abdominal pain and diarrhea.  Genitourinary: Negative.   Musculoskeletal: Positive for back pain.  Neurological: Positive for weakness.  Endo/Heme/Allergies: Negative.   Psychiatric/Behavioral: Negative.     Past Medical History  Diagnosis Date  . GERD (gastroesophageal reflux disease)   . Colon polyp   . Asthma   . CAD (coronary artery disease)   . Diabetes mellitus   . Hypertension   . Osteoarthritis   . Microcytosis   . Sleep disorder   . Obesity   . Depression 11/07    Hospitalization required  . Cataract   . Diverticulosis   . Esophageal stricture   . Hemorrhoids   . IBS (irritable bowel syndrome)    Past Surgical History  Procedure Laterality Date  . Abdominal hysterectomy    . Eye surgery    . Upper gastrointestinal endoscopy    . Colonoscopy    . Abdominal hysterectomy      SOCIAL HISTORY:  reports that she has never smoked. She has never used smokeless tobacco. She reports that she does not drink alcohol or use illicit drugs. Lives:  at home With:  Married but lives alone Assistive devices:   Uses a cane sometimes for ambulation.   Allergies  Allergen Reactions  . Latex Rash    unknown  . Percocet [Oxycodone-Acetaminophen] Itching  Family History  Problem Relation Age of Onset  . Rectal cancer Sister   . Lung cancer Father   . Esophageal cancer Father   . Breast cancer Sister 24  . Breast cancer Mother 74  . Colon cancer Other     Aunt, Uncle  . Liver cancer Brother     Nephew  . Diabetes Sister   . Diabetes Brother   . Diabetes Other     son & daughter  . Heart disease Sister   . Heart disease Brother   . Kidney disease Sister   . Breast cancer Maternal Aunt     patient reports 11 mat aunts with breast cancer  . Breast cancer Paternal Aunt 85  . Breast cancer Maternal Grandmother     unk age     Prior to Admission medications   Medication Sig Start Date End Date Taking? Authorizing Provider  albuterol (PROVENTIL,VENTOLIN) 90 MCG/ACT inhaler Inhale 2 puffs into the lungs every 6 (six) hours as needed for shortness of breath. Wheezing or shortness of breath    Historical Provider, MD  amLODipine (NORVASC) 5 MG tablet Take 1 tablet (5 mg total) by mouth daily. 12/09/13   Harden Mo, MD  aspirin 81 MG tablet Take 81 mg by mouth daily.      Historical Provider, MD  dextromethorphan (DELSYM) 30 MG/5ML liquid Take 10 mLs (60 mg total) by mouth 2 (two) times daily. For cough 04/04/14   Billy Fischer, MD  diclofenac sodium (VOLTAREN) 1 % GEL Apply 2 g topically 4 (four) times daily.    Historical Provider, MD  dicyclomine (BENTYL) 10 MG capsule Take 1 tab 3 times a day before meals. 05/22/11   Amy S Esterwood, PA-C  ergocalciferol (VITAMIN D2) 50000 UNITS capsule Take 50,000 Units by mouth once a week. Usually takes on Saturday or fridays    Historical Provider, MD  famotidine (PEPCID) 40 MG tablet Take 40 mg by mouth daily.    Historical Provider, MD  fenofibrate (TRICOR) 145 MG tablet Take 145 mg by mouth daily.      Historical Provider, MD  ferrous sulfate 325 (65 FE) MG tablet Take 325 mg by mouth daily with breakfast.      Historical Provider, MD  fluticasone (FLONASE) 50 MCG/ACT nasal spray Place 1 spray into both nostrils 2 (two) times daily. 04/04/14   Billy Fischer, MD  fluticasone-salmeterol (ADVAIR HFA) [phone] MCG/ACT inhaler Inhale 2 puffs into the lungs 2 (two) times daily.    Historical Provider, MD  HYDROcodone-acetaminophen (VICODIN) 5-500 MG per tablet Take 1 tablet by mouth every 6 (six) hours as needed.      Historical Provider, MD  hyoscyamine (LEVBID) 0.375 MG 12 hr tablet Take 0.375 mg by mouth every 12 (twelve) hours as needed for cramping.    Historical Provider, MD  insulin NPH-regular Human (NOVOLIN 70/30) (70-30) 100 UNIT/ML injection Inject 20 Units into the skin 2  (two) times daily with a meal. 03/11/14   Dixie Dials, MD  Insulin Syringes, Disposable, U-100 1 ML MISC by Does not apply route.      Historical Provider, MD  ipratropium (ATROVENT) 0.02 % nebulizer solution Take 0.5 mg by nebulization 3 (three) times daily as needed for wheezing or shortness of breath.    Historical Provider, MD  Lancets MISC by Does not apply route.      Historical Provider, MD  loratadine (CLARITIN) 10 MG tablet Take 10 mg by mouth daily.  Historical Provider, MD  metFORMIN (GLUCOPHAGE) 500 MG tablet Take 1 tablet (500 mg total) by mouth 2 (two) times daily with a meal. 03/11/14   Dixie Dials, MD  methocarbamol (ROBAXIN) 500 MG tablet Take 1 tablet (500 mg total) by mouth 2 (two) times daily. 03/22/14   Antonietta Breach, PA-C  metoprolol (LOPRESSOR) 50 MG tablet Take 50 mg by mouth 2 (two) times daily.      Historical Provider, MD  NON FORMULARY [employer] test strip( Glucose Blood)     Historical Provider, MD  potassium chloride (K-DUR) 10 MEQ tablet Take 1 tablet (10 mEq total) by mouth 3 (three) times daily. 12/09/13   Harden Mo, MD  promethazine (PHENERGAN) 25 MG tablet Take 0.5 tablets (12.5 mg total) by mouth every 6 (six) hours as needed for nausea or vomiting. 03/11/14   Dixie Dials, MD  simvastatin (ZOCOR) 40 MG tablet Take 0.5 tablets (20 mg total) by mouth at bedtime. 03/11/14   Dixie Dials, MD  tobramycin (TOBREX) 0.3 % ophthalmic ointment Place 1 application into both eyes 4 (four) times daily.     Historical Provider, MD  traMADol (ULTRAM) 50 MG tablet Take 50 mg by mouth every 6 (six) hours as needed for moderate pain.    Historical Provider, MD  travoprost, benzalkonium, (TRAVATAN) 0.004 % ophthalmic solution Place 1 drop into both eyes at bedtime.      Historical Provider, MD   PHYSICAL EXAM: Filed Vitals:   08/26/15 1211 08/26/15 1215 08/26/15 1230 08/26/15 1310  BP:  103/60 111/56 95/57  Pulse:  69 69 71  Temp: 96.7 F (35.9 C)     TempSrc: Rectal     Resp:   16 17 17   Height:      Weight:      SpO2:  100% 100% 100%    Wt Readings from Last 3 Encounters:  08/26/15 158.759 kg (350 lb)  03/22/14 145.151 kg (320 lb)  03/11/14 137.213 kg (302 lb 8 oz)    General:  Pleasant morbidly obese black female. Appears calm and comfortable Eyes: PER, normal lids, irises & conjunctiva ENT: grossly normal hearing. Tongue dry. Neck: no LAD, no masses Cardiovascular: RRR, no murmurs. No LE edema.  Respiratory: Respirations even and unlabored. Normal respiratory effort. Decreased breath sounds at bilateral bases. No wheezes / rales .   Abdomen: soft, obese,non-distended, non-tender, active bowel sounds. No obvious masses.  Skin: no rash seen on limited exam Musculoskeletal: grossly normal tone BUE/BLE Psychiatric: grossly normal mood and affect, speech fluent and appropriate Neurologic: grossly non-focal.         LABS ON ADMISSION:    Basic Metabolic Panel:  Recent Labs Lab 08/26/15 1120  NA 132*  K 5.0  CL 96*  CO2 26  GLUCOSE 468*  BUN 15  CREATININE 1.21*  CALCIUM 8.1*   Liver Function Tests:  Recent Labs Lab 08/26/15 1120  AST 17  ALT 18  ALKPHOS 77  BILITOT 0.4  PROT 6.5  ALBUMIN 3.2*     CBC:  Recent Labs Lab 08/26/15 1120  WBC 6.7  NEUTROABS 4.2  HGB 8.2*  HCT 27.5*  MCV 61.9*  PLT 255   Radiological Exams on Admission:  CXR pending   2015 Echocardiogram:  Left ventricle: The cavity size was normal. There was mild concentric hypertrophy. Systolic function was mildly reduced. The estimated ejection fraction was in the range of 45% to 50%. There is mild hypokinesis of the inferior myocardium. Doppler parameters are consistent with abnormal  left ventricular relaxation (grade 1 diastolic dysfunction). - Mitral valve: Mild regurgitation. - Left atrium: The atrium was mildly dilated. - Pulmonary arteries: Systolic pressure was mildly increased. PA peak pressure: 81mm Hg (S).   ASSESSMENT / PLAN     Dehydration.  Presents with hypotension, ARF, weakness, and dry mucous, membranes after recent illness with nausea, vomiting, diarrhea.  -Admit to medical bed -IV fluid resuscitation  Uncontrolled diabetes mellitus ([place]). May be contributing to her dehydration -Hold home metformin due to acute renal failure -Continue 70/30 BID with SSI at lunch -hgb A1c -consult diabetes coordinator  Acute renal failure ([place]). Baseline creatinine 0.8, currently 1.2. Hydrate with IV fluids- check am BMET- check urine sodium and creatinine to determine FeNa  Prolonged Q-T interval on ECG - Avoid QT prolonging medications  - check am EKG -check Mg+ level,  Potassium is 5.0.   Iron deficiency anemia. MCV 61. At 8.2 today, baseline hgb 10.1. The 8.2 is in setting of hemoconcentration so expect true hgb to be even lower. No NSAID use  No overt bleeding.  Check hemoccults. Check iron studies. May need GI and /or Hematology consult  Hypertension. Low BP at present from dehyration. BP improved with IVF, continue home anti-hypertensives with holding parameters.   Obstructive sleep apnea. Doesn't like full face CPAP at home, Will ask RT to see and arrange for CPAP  Asthma. Stable. Continue home inhalers   GERD. Stable. Continue H2 blocker  Irritable bowel syndrome. Chronic lower abdominal pain. Continue anti-cholingergic.   Chronic high back pain. On chronic narcotics.      CONSULTANTS: none  Code Status:  DNI DVT Prophylaxis: Family Communication: Patient alert, oriented and understands plan of care.  Disposition Plan: Discharge to home in 24-48 hours   Time spent: 60 minutes Tye Savoy  NP [facility] Pager [phone]   I have examined the patient, reviewed the chart, modified the above note and discussed the case with Tye Savoy, NP. This is a 59 year old female who presents after an acute illness of nausea, vomiting, abdominal pain and diarrhea with lethargy today. Her GI  symptoms have resolved and she has been able to tolerate a regular diet. She is found to be dehydrated on exam today. She is also found to have acute renal failure which may partly be prerenal and partly ATN from hypotension (continuing to take her antihypertensives while being dehydrated). She was hypotensive on arrival but this has improved after receiving 3 L of normal saline. No urine output yet. Continue to hydrate and follow for improvement.  CBGs also significantly elevated despite the fact that she took her full dose of insulin this morning and states she did not have anything to eat. She states she checks her sugars at home and they're usually well controlled below 200. Follow A1c. Resume home dose of insulin and a sliding scale at lunch time. Follow-up on hemoglobin which may drop with hydration. Anemia panel ordered- on Iron at home. Encouraged to wear her C Pap at home which she has not been doing because the mask is uncomfortable. Follow QTc - EKG ordered.   Debbe Odea, MD

## 2015-08-26 NOTE — Progress Notes (Signed)
Received Diabetes Coordinator consult. Noted that patient is in ED. Spoke with staff RN taking care of the patient.  Will be admitted to the floor. RN stated that patient has not had any insulin today.  Was found unresponsive at home. Will follow up after admission to the hospital. Harvel Ricks RN BSN CDE

## 2015-08-27 ENCOUNTER — Inpatient Hospital Stay (HOSPITAL_COMMUNITY): Payer: Medicaid Other

## 2015-08-27 DIAGNOSIS — E0800 Diabetes mellitus due to underlying condition with hyperosmolarity without nonketotic hyperglycemic-hyperosmolar coma (NKHHC): Secondary | ICD-10-CM

## 2015-08-27 DIAGNOSIS — Z794 Long term (current) use of insulin: Secondary | ICD-10-CM

## 2015-08-27 DIAGNOSIS — R9431 Abnormal electrocardiogram [ECG] [EKG]: Secondary | ICD-10-CM

## 2015-08-27 DIAGNOSIS — M549 Dorsalgia, unspecified: Secondary | ICD-10-CM

## 2015-08-27 DIAGNOSIS — R739 Hyperglycemia, unspecified: Secondary | ICD-10-CM

## 2015-08-27 DIAGNOSIS — N179 Acute kidney failure, unspecified: Secondary | ICD-10-CM

## 2015-08-27 DIAGNOSIS — M546 Pain in thoracic spine: Secondary | ICD-10-CM

## 2015-08-27 DIAGNOSIS — G8929 Other chronic pain: Secondary | ICD-10-CM

## 2015-08-27 DIAGNOSIS — E1300 Other specified diabetes mellitus with hyperosmolarity without nonketotic hyperglycemic-hyperosmolar coma (NKHHC): Secondary | ICD-10-CM

## 2015-08-27 DIAGNOSIS — R7309 Other abnormal glucose: Secondary | ICD-10-CM

## 2015-08-27 DIAGNOSIS — I10 Essential (primary) hypertension: Secondary | ICD-10-CM

## 2015-08-27 DIAGNOSIS — I4581 Long QT syndrome: Secondary | ICD-10-CM

## 2015-08-27 DIAGNOSIS — G9340 Encephalopathy, unspecified: Secondary | ICD-10-CM

## 2015-08-27 DIAGNOSIS — K589 Irritable bowel syndrome without diarrhea: Secondary | ICD-10-CM

## 2015-08-27 DIAGNOSIS — K580 Irritable bowel syndrome with diarrhea: Secondary | ICD-10-CM

## 2015-08-27 DIAGNOSIS — G4733 Obstructive sleep apnea (adult) (pediatric): Secondary | ICD-10-CM

## 2015-08-27 LAB — TSH: TSH: 1.887 u[IU]/mL (ref 0.350–4.500)

## 2015-08-27 LAB — MAGNESIUM: Magnesium: 1.6 mg/dL — ABNORMAL LOW (ref 1.7–2.4)

## 2015-08-27 LAB — CBC
HCT: 25.2 % — ABNORMAL LOW (ref 36.0–46.0)
HEMOGLOBIN: 7.8 g/dL — AB (ref 12.0–15.0)
MCH: 19.2 pg — AB (ref 26.0–34.0)
MCHC: 31 g/dL (ref 30.0–36.0)
MCV: 61.9 fL — AB (ref 78.0–100.0)
Platelets: 257 10*3/uL (ref 150–400)
RBC: 4.07 MIL/uL (ref 3.87–5.11)
RDW: 21.3 % — ABNORMAL HIGH (ref 11.5–15.5)
WBC: 7.3 10*3/uL (ref 4.0–10.5)

## 2015-08-27 LAB — GLUCOSE, CAPILLARY
GLUCOSE-CAPILLARY: 114 mg/dL — AB (ref 65–99)
GLUCOSE-CAPILLARY: 290 mg/dL — AB (ref 65–99)
GLUCOSE-CAPILLARY: 319 mg/dL — AB (ref 65–99)
Glucose-Capillary: 169 mg/dL — ABNORMAL HIGH (ref 65–99)
Glucose-Capillary: 326 mg/dL — ABNORMAL HIGH (ref 65–99)
Glucose-Capillary: 285 mg/dL — ABNORMAL HIGH (ref 65–99)

## 2015-08-27 LAB — URINE CULTURE

## 2015-08-27 LAB — BASIC METABOLIC PANEL
ANION GAP: 9 (ref 5–15)
BUN: 8 mg/dL (ref 6–20)
CALCIUM: 8 mg/dL — AB (ref 8.9–10.3)
CHLORIDE: 98 mmol/L — AB (ref 101–111)
CO2: 26 mmol/L (ref 22–32)
Creatinine, Ser: 0.87 mg/dL (ref 0.44–1.00)
GFR calc non Af Amer: >60 mL/min (ref >60)
GLUCOSE: 378 mg/dL — AB (ref 65–99)
POTASSIUM: 3.7 mmol/L (ref 3.5–5.1)
Sodium: 133 mmol/L — ABNORMAL LOW (ref 135–145)

## 2015-08-27 LAB — HEMOGLOBIN A1C
HEMOGLOBIN A1C: 11.9 % — AB (ref 4.8–5.6)
Mean Plasma Glucose: 295 mg/dL

## 2015-08-27 LAB — OCCULT BLOOD X 1 CARD TO LAB, STOOL: Fecal Occult Bld: POSITIVE — AB

## 2015-08-27 MED ORDER — SODIUM CHLORIDE 0.9 % IV SOLN
500.0000 mg | Freq: Once | INTRAVENOUS | Status: AC
  Administered 2015-08-27: 500 mg via INTRAVENOUS
  Filled 2015-08-27 (×2): qty 10

## 2015-08-27 MED ORDER — SODIUM CHLORIDE 0.9 % IV SOLN
25.0000 mg | Freq: Once | INTRAVENOUS | Status: AC
  Administered 2015-08-27: 25 mg via INTRAVENOUS
  Filled 2015-08-27: qty 0.5

## 2015-08-27 MED ORDER — MAGNESIUM SULFATE 2 GM/50ML IV SOLN
2.0000 g | Freq: Once | INTRAVENOUS | Status: AC
  Administered 2015-08-27: 2 g via INTRAVENOUS
  Filled 2015-08-27: qty 50

## 2015-08-27 MED ORDER — FERROUS SULFATE 325 (65 FE) MG PO TABS
325.0000 mg | ORAL_TABLET | Freq: Three times a day (TID) | ORAL | Status: DC
  Administered 2015-08-27 – 2015-08-28 (×4): 325 mg via ORAL
  Filled 2015-08-27 (×4): qty 1

## 2015-08-27 NOTE — Progress Notes (Addendum)
[facility] PROGRESS NOTE    Progress Note   [name] NWG:[id number] DOB: December 08, 1955 DOA: 08/26/2015 PCP: Philis Fendt, MD   Brief Narrative:   Chelsea Lester is an 59 y.o. female comes in for dehydration  Assessment/Plan:  AKI: Likely prerenal  in the setting of diarrhea and hyperglycemia., baseline creatinine less than 1. Fractional secretion was less than 1 we'll go ahead and continue IV hydration.  Acute encephalopathy: Likely due to narcotic accumulation due to acute renal failure. No resolved.   Prolonged QT on EKG: Mag was less than 1.4 coherent and given a bolus of neck exam recheck in the morning. Basic metabolic pending for potassium.  Uncontrolled diabetes mellitus ([place]) Metformin held on admission due to acute renal failure. A1c is pending she was continue in 70/30 twice a day. Blood glucose fairly controlled.  Microcytic anemia:  Likely due to iron deficiency ferritin of 14, we'll give IV iron and start her on oral supplements for home. No bowel movements if PT is pending. UNSPECIFIED IRON DEFICIENCY ANEMIA  Essential Hypertension: Continue antihypertensive medications.  Asthma/obstructive sleep apnea: Seems to be B- stable, continue C Pap at night  GERD: Continue H2 blocker.  IBS: Continue anticholinergic.  Asthma  GERD  Irritable bowel syndrome   Hyperglycemia   Chronic high back pain   Acute renal failure (HCC)   Prolonged Q-T interval on ECG   OSA (obstructive sleep apnea)    DVT Prophylaxis - Lovenox ordered.  Family Communication: *husban at beside Disposition Plan: Home in am Code Status:     Code Status Orders        Start     Ordered   08/26/15 1453  Limited resuscitation (code)   Continuous    Question Answer Comment  In the event of cardiac or respiratory ARREST: Initiate Code Blue, Call Rapid Response Yes   In the event of cardiac or respiratory ARREST: Perform CPR Yes   In the event of cardiac or  respiratory ARREST: Perform Intubation/Mechanical Ventilation No   In the event of cardiac or respiratory ARREST: Use NIPPV/BiPAp only if indicated Yes   In the event of cardiac or respiratory ARREST: Administer ACLS medications if indicated Yes   In the event of cardiac or respiratory ARREST: Perform Defibrillation or Cardioversion if indicated Yes      08/26/15 1452        IV Access:    Peripheral IV   Procedures and diagnostic studies:   No results found.   Medical Consultants:    None.  Anti-Infectives:   Anti-infectives    None      Subjective:    Chelsea Lester she has no complaints she relates she feels better today. She relates she doesn't remember anything that happened yesterday.  Objective:    Filed Vitals:   08/26/15 1520 08/26/15 2053 08/26/15 2155 08/27/15 0445  BP: 121/67  134/70 133/65  Pulse: 69  83 75  Temp: 97.7 F (36.5 C)  98.3 F (36.8 C) 98.3 F (36.8 C)  TempSrc: Oral  Oral Oral  Resp: 21  18 18   Height:      Weight:      SpO2: 100% 99% 100% 99%    Intake/Output Summary (Last 24 hours) at 08/27/15 0827 Last data filed at 08/26/15 2102  Gross per 24 hour  Intake   2120 ml  Output    200 ml  Net   1920 ml   Filed Weights   08/26/15 1056 08/26/15  1500  Weight: 158.759 kg (350 lb) 144.244 kg (318 lb)    Exam: Gen:  NAD, awake alert and oriented 3 Cardiovascular:  RRR, No M/R/G Chest and lungs:   CTAB Abdomen:  Abdomen soft, NT/ND, + BS Extremities:  No C/E/C   Data Reviewed:    Labs: Basic Metabolic Panel:  Recent Labs Lab 08/26/15 1120 08/26/15 1532  NA 132*  --   K 5.0  --   CL 96*  --   CO2 26  --   GLUCOSE 468*  --   BUN 15  --   CREATININE 1.21*  --   CALCIUM 8.1*  --   MG  --  1.4*   GFR Estimated Creatinine Clearance: 74.8 mL/min (by C-G formula based on Cr of 1.21). Liver Function Tests:  Recent Labs Lab 08/26/15 1120  AST 17  ALT 18  ALKPHOS 77  BILITOT 0.4  PROT 6.5  ALBUMIN  3.2*   No results for input(s): LIPASE, AMYLASE in the last 168 hours. No results for input(s): AMMONIA in the last 168 hours. Coagulation profile No results for input(s): INR, PROTIME in the last 168 hours.  CBC:  Recent Labs Lab 08/26/15 1120 08/27/15 0453  WBC 6.7 7.3  NEUTROABS 4.2  --   HGB 8.2* 7.8*  HCT 27.5* 25.2*  MCV 61.9* 61.9*  PLT 255 257   Cardiac Enzymes:  Recent Labs Lab 08/26/15 1120  TROPONINI <0.03   BNP (last 3 results) No results for input(s): PROBNP in the last 8760 hours. CBG:  Recent Labs Lab 08/26/15 1531 08/26/15 1737 08/26/15 2152 08/26/15 2359 08/27/15 0422  GLUCAP 352* 304* 216* 114* 169*   D-Dimer: No results for input(s): DDIMER in the last 72 hours. Hgb A1c:  Recent Labs  08/26/15 1413  HGBA1C 11.9*   Lipid Profile: No results for input(s): CHOL, HDL, LDLCALC, TRIG, CHOLHDL, LDLDIRECT in the last 72 hours. Thyroid function studies: No results for input(s): TSH, T4TOTAL, T3FREE, THYROIDAB in the last 72 hours.  Invalid input(s): FREET3 Anemia work up:  Recent Labs  08/26/15 1532  FERRITIN 14  TIBC 337  IRON 20*   Sepsis Labs:  Recent Labs Lab 08/26/15 1120 08/26/15 1134 08/26/15 1415 08/27/15 0453  WBC 6.7  --   --  7.3  LATICACIDVEN  --  1.95 1.75  --    Microbiology Recent Results (from the past 240 hour(s))  MRSA PCR Screening     Status: None   Collection Time: 08/26/15  5:54 PM  Result Value Ref Range Status   MRSA by PCR NEGATIVE NEGATIVE Final    Comment:        The GeneXpert MRSA Assay (FDA approved for NASAL specimens only), is one component of a comprehensive MRSA colonization surveillance program. It is not intended to diagnose MRSA infection nor to guide or monitor treatment for MRSA infections.      Medications:   . amLODipine  5 mg Oral Daily  . aspirin EC  81 mg Oral Daily  . dicyclomine  10 mg Oral TID AC & HS  . enoxaparin (LOVENOX) injection  70 mg Subcutaneous Q24H  .  famotidine  40 mg Oral Daily  . fenofibrate  54 mg Oral Daily  . fluticasone  1 spray Each Nare BID  . insulin aspart  0-9 Units Subcutaneous 6 times per day  . insulin aspart  10 Units Subcutaneous Once  . insulin aspart protamine- aspart  40 Units Subcutaneous BID WC  . loratadine  10 mg Oral Daily  . methocarbamol  500 mg Oral BID  . metoprolol  50 mg Oral BID  . mometasone-formoterol  2 puff Inhalation BID  . simvastatin  20 mg Oral QHS  . Travoprost (BAK Free)  1 drop Both Eyes QHS   Continuous Infusions: . sodium chloride 1,000 mL (08/27/15 0420)    Time spent: 25 min   LOS: 1 day   Charlynne Cousins  [facility] Pager [phone]  *Please refer to [email or web address], password [email or web address] to get updated schedule on who will round on this patient, as hospitalists switch teams weekly. If 7PM-7AM, please contact night-coverage at [email or web address], password [email or web address] for any overnight needs.  08/27/2015, 8:27 AM

## 2015-08-27 NOTE — Progress Notes (Signed)
Patient didn't want CPAP tonight. Will set up if she changes her mind.

## 2015-08-28 LAB — GLUCOSE, CAPILLARY
GLUCOSE-CAPILLARY: 237 mg/dL — AB (ref 65–99)
GLUCOSE-CAPILLARY: 238 mg/dL — AB (ref 65–99)
Glucose-Capillary: 204 mg/dL — ABNORMAL HIGH (ref 65–99)
Glucose-Capillary: 228 mg/dL — ABNORMAL HIGH (ref 65–99)
Glucose-Capillary: 229 mg/dL — ABNORMAL HIGH (ref 65–99)

## 2015-08-28 MED ORDER — INSULIN NPH ISOPHANE & REGULAR (70-30) 100 UNIT/ML ~~LOC~~ SUSP: 60.0000 [IU] | Freq: Every day | SUBCUTANEOUS | Status: DC

## 2015-08-28 MED ORDER — FENOFIBRATE 145 MG PO TABS: 145.0000 mg | ORAL_TABLET | Freq: Every day | ORAL | Status: DC

## 2015-08-28 MED ORDER — INSULIN NPH ISOPHANE & REGULAR (70-30) 100 UNIT/ML ~~LOC~~ SUSP: 40.0000 [IU] | Freq: Every day | SUBCUTANEOUS | Status: DC

## 2015-08-28 MED ORDER — POTASSIUM CHLORIDE ER 10 MEQ PO TBCR: 10.0000 meq | EXTENDED_RELEASE_TABLET | Freq: Three times a day (TID) | ORAL | Status: DC

## 2015-08-28 MED ORDER — SIMVASTATIN 40 MG PO TABS: 20.0000 mg | ORAL_TABLET | Freq: Every day | ORAL | Status: DC

## 2015-08-28 MED ORDER — FERROUS SULFATE 325 (65 FE) MG PO TABS: 325.0000 mg | ORAL_TABLET | Freq: Every day | ORAL | Status: DC

## 2015-08-28 MED ORDER — TRAVOPROST 0.004 % OP SOLN: 1.0000 [drp] | Freq: Every day | OPHTHALMIC | Status: DC

## 2015-08-28 MED ORDER — METFORMIN HCL 500 MG PO TABS: 1000.0000 mg | ORAL_TABLET | Freq: Two times a day (BID) | ORAL | Status: DC

## 2015-08-28 MED ORDER — METOPROLOL TARTRATE 50 MG PO TABS: 50.0000 mg | ORAL_TABLET | Freq: Two times a day (BID) | ORAL | Status: DC

## 2015-08-28 MED ORDER — INSULIN ASPART PROT & ASPART (70-30 MIX) 100 UNIT/ML ~~LOC~~ SUSP
20.0000 [IU] | Freq: Two times a day (BID) | SUBCUTANEOUS | Status: DC
  Filled 2015-08-28: qty 10

## 2015-08-28 MED ORDER — FLUTICASONE-SALMETEROL 115-21 MCG/ACT IN AERO: 2.0000 | INHALATION_SPRAY | Freq: Two times a day (BID) | RESPIRATORY_TRACT | Status: DC

## 2015-08-28 MED ORDER — HYDROCODONE-ACETAMINOPHEN 5-500 MG PO TABS: 1.0000 | ORAL_TABLET | Freq: Four times a day (QID) | ORAL | Status: DC | PRN

## 2015-08-28 MED ORDER — FLUTICASONE PROPIONATE 50 MCG/ACT NA SUSP: 1.0000 | Freq: Two times a day (BID) | NASAL | Status: DC

## 2015-08-28 MED ORDER — AMLODIPINE BESYLATE 5 MG PO TABS: 5.0000 mg | ORAL_TABLET | Freq: Every day | ORAL | Status: DC

## 2015-08-28 MED ORDER — ERGOCALCIFEROL 1.25 MG (50000 UT) PO CAPS: 50000.0000 [IU] | ORAL_CAPSULE | ORAL | Status: DC

## 2015-08-28 MED ORDER — DICYCLOMINE HCL 10 MG PO CAPS
ORAL_CAPSULE | ORAL | Status: DC
Start: 2015-08-28 — End: 2017-04-15

## 2015-08-28 MED ORDER — IPRATROPIUM BROMIDE 0.02 % IN SOLN: 0.5000 mg | Freq: Three times a day (TID) | RESPIRATORY_TRACT | Status: DC | PRN

## 2015-08-28 NOTE — Discharge Summary (Addendum)
Physician Discharge Summary  MELVIE PAGLIA WIO:[id number] DOB: Mar 24, 1956 DOA: 08/26/2015  PCP: Philis Fendt, MD  Admit date: 08/26/2015 Discharge date: 08/28/2015  Time spent: 35 minutes  Recommendations for Outpatient Follow-up:  1. Follow-up with PCP in 2 weeks she will check her CBGs before meals and at bedtime and PCP to evaluate to titrate insulin as tolerated. 2. We'll be careful with narcotics 3. Will need follow-up colonoscopy as an outpatient, as she has microcytic anemia with FOBT positive. The patient does not remember when was her last colonoscopy. 4. Follow-up with cardiology in 1 week to reevaluate her due to her shortness of breath is a been going on for over 3 months.   Discharge Diagnoses:  Principal Problem:   Acute encephalopathy Active Problems:   Uncontrolled diabetes mellitus (HCC)   UNSPECIFIED IRON DEFICIENCY ANEMIA   Hypertension   HYPOTENSION   Asthma   GERD   Irritable bowel syndrome   Hyperglycemia   Chronic high back pain   AKI (acute kidney injury) ([place])   Prolonged Q-T interval on ECG   OSA (obstructive sleep apnea)   Discharge Condition: stable  Diet recommendation: carb modified  Filed Weights   08/26/15 1056 08/26/15 1500  Weight: 158.759 kg (350 lb) 144.244 kg (318 lb)    History of present illness:  59 year old with uncontrolled diabetes mellitus presents to the ED after the home health nurse found her lethargic and unable to walk. Patient has no recollection of events. Family relates relates she has had several days of illness with nausea vomiting and diarrhea.  Hospital Course:  Acute kidney injury: Likely prerenal in the setting of diarrhea and hyperglycemia. Her baseline creatinine is less than 1 on admission it was 1.3. She was started on IV fluids her creatinine returned to baseline.  Acute encephalopathy: Likely due to narcotics in the setting of acute renal failure, resolved narcotics were held Ardean Larsen is an  outpatient.  Prolonged QT on EKG: Mag was let stent 1, was repleted her QTC improved.  Uncontrolled diabetes mellitus: Her metformin was held on admission due to acute renal failure, her A1c was 11.9, her 73 was increase in her metformin resumed. She will follow up with PCP and titrate insulin as tolerated.  Microcytic anemia: Likely due to her iron deficiency IV iron was given she will follow-up with PCP as an outpatient. Her ferritin was low, her FOBT was positive. She has had no black stools or bright red blood per rectum. He'll need a follow-up appointment for an outpatient colonoscopy.  Asthma/obstructive sleep apnea: Seems to be stable.  Essential hypertension: There were held on admission no changes were made.  Shortness of breath: She relates she has had gradual shortness of breath for 3 months.  She denies any chest pain, her cardiologist has been following up as an outpatient. She's had a nonobstructive coronary artery sees in the past. She had a 2-D echo that showed grade 1 diastolic heart failure. She'll follow up with cardiology to evaluate her shortness of breath. We'll send her home on oxygen 1 L.  She is morbidly obese and her shortness of breath IV due to obesity hypoventilation syndrome as she does have obstructive sleep apnea.  Procedures:  CXR  Consultations:  none  Discharge Exam: Filed Vitals:   08/28/15 0640  BP: 141/75  Pulse: 70  Temp: 98.2 F (36.8 C)  Resp: 18    General: A&O x3 Cardiovascular: RRR Respiratory: good air movement CTA B/L  Discharge Instructions  Discharge Instructions    Diet - low sodium heart healthy    Complete by:  As directed      Increase activity slowly    Complete by:  As directed           Current Discharge Medication List    CONTINUE these medications which have CHANGED   Details  amLODipine (NORVASC) 5 MG tablet Take 1 tablet (5 mg total) by mouth daily. Qty: 30 tablet, Refills: 0    dicyclomine  (BENTYL) 10 MG capsule Take 1 tab 3 times a day before meals. Qty: 90 capsule, Refills: 1    ergocalciferol (VITAMIN D2) 50000 UNITS capsule Take 1 capsule (50,000 Units total) by mouth once a week. Usually takes on Saturday or fridays Qty: 30 capsule, Refills: 0    fenofibrate (TRICOR) 145 MG tablet Take 1 tablet (145 mg total) by mouth daily. Qty: 30 tablet, Refills: 0    ferrous sulfate 325 (65 FE) MG tablet Take 1 tablet (325 mg total) by mouth daily with breakfast. Qty: 90 tablet, Refills: 3    fluticasone (FLONASE) 50 MCG/ACT nasal spray Place 1 spray into both nostrils 2 (two) times daily. Qty: 1 g, Refills: 2    fluticasone-salmeterol (ADVAIR HFA) 115-21 MCG/ACT inhaler Inhale 2 puffs into the lungs 2 (two) times daily. Qty: 1 Inhaler, Refills: 12    HYDROcodone-acetaminophen (VICODIN) 5-500 MG tablet Take 1 tablet by mouth every 6 (six) hours as needed. Qty: 10 tablet, Refills: 0    !! insulin NPH-regular Human (NOVOLIN 70/30) (70-30) 100 UNIT/ML injection Inject 40 Units into the skin daily with supper. Qty: 10 mL, Refills: 11    !! insulin NPH-regular Human (NOVOLIN 70/30) (70-30) 100 UNIT/ML injection Inject 60 Units into the skin daily with breakfast. Qty: 10 mL, Refills: 11    ipratropium (ATROVENT) 0.02 % nebulizer solution Take 2.5 mLs (0.5 mg total) by nebulization 3 (three) times daily as needed for wheezing or shortness of breath. Qty: 75 mL, Refills: 12    metFORMIN (GLUCOPHAGE) 500 MG tablet Take 2 tablets (1,000 mg total) by mouth 2 (two) times daily with a meal. Qty: 60 tablet, Refills: 3    metoprolol (LOPRESSOR) 50 MG tablet Take 1 tablet (50 mg total) by mouth 2 (two) times daily. Qty: 30 tablet, Refills: 0    potassium chloride (K-DUR) 10 MEQ tablet Take 1 tablet (10 mEq total) by mouth 3 (three) times daily. Qty: 45 tablet, Refills: 0    simvastatin (ZOCOR) 40 MG tablet Take 0.5 tablets (20 mg total) by mouth at bedtime. Qty: 30 tablet, Refills: 0     travoprost, benzalkonium, (TRAVATAN) 0.004 % ophthalmic solution Place 1 drop into both eyes at bedtime. Qty: 2.5 mL, Refills: 12     !! - Potential duplicate medications found. Please discuss with provider.    CONTINUE these medications which have NOT CHANGED   Details  albuterol (PROVENTIL,VENTOLIN) 90 MCG/ACT inhaler Inhale 2 puffs into the lungs every 6 (six) hours as needed for shortness of breath. Wheezing or shortness of breath    aspirin 81 MG tablet Take 81 mg by mouth daily.      diclofenac sodium (VOLTAREN) 1 % GEL Apply 2 g topically 4 (four) times daily.    famotidine (PEPCID) 40 MG tablet Take 40 mg by mouth daily.    hyoscyamine (LEVBID) 0.375 MG 12 hr tablet Take 0.375 mg by mouth every 12 (twelve) hours as needed for cramping.    loratadine (CLARITIN) 10 MG tablet  Take 10 mg by mouth daily.     methocarbamol (ROBAXIN) 500 MG tablet Take 1 tablet (500 mg total) by mouth 2 (two) times daily. Qty: 20 tablet, Refills: 0    promethazine (PHENERGAN) 25 MG tablet Take 0.5 tablets (12.5 mg total) by mouth every 6 (six) hours as needed for nausea or vomiting. Qty: 30 tablet, Refills: 0    tobramycin (TOBREX) 0.3 % ophthalmic ointment Place 1 application into both eyes 4 (four) times daily.     Insulin Syringes, Disposable, U-100 1 ML MISC by Does not apply route.      Lancets MISC by Does not apply route.      NON FORMULARY [employer] test strip( Glucose Blood)       STOP taking these medications     traMADol (ULTRAM) 50 MG tablet        Allergies  Allergen Reactions  . Latex Rash    unknown  . Percocet [Oxycodone-Acetaminophen] Itching        Follow-up Information    Follow up with Philis Fendt, MD.   Specialty:  Internal Medicine   Contact information:   [address] [place] [address] [phone]        The results of significant diagnostics from this hospitalization (including imaging, microbiology, ancillary and laboratory) are  listed below for reference.    Significant Diagnostic Studies: Dg Chest 2 View  08/27/2015  CLINICAL DATA:  Hypotension.  Right upper chest pain. EXAM: CHEST  2 VIEW COMPARISON:  03/24/2015 FINDINGS: Cardiac silhouette is upper limits of normal to mildly enlarged in size. The lungs are hypoinflated with minimal bibasilar opacities. No sizable pleural effusion or pneumothorax is seen. No acute osseous abnormality. IMPRESSION: Hypoinflation with minimal bibasilar opacities, likely atelectasis. Electronically Signed   By: Logan Bores M.D.   On: 08/27/2015 09:50    Microbiology: Recent Results (from the past 240 hour(s))  Blood Culture (routine x 2)     Status: None (Preliminary result)   Collection Time: 08/26/15 11:15 AM  Result Value Ref Range Status   Specimen Description BLOOD RIGHT ANTECUBITAL  Final   Special Requests BOTTLES DRAWN AEROBIC AND ANAEROBIC 10ML  Final   Culture NO GROWTH 2 DAYS  Final   Report Status PENDING  Incomplete  Blood Culture (routine x 2)     Status: None (Preliminary result)   Collection Time: 08/26/15 11:21 AM  Result Value Ref Range Status   Specimen Description BLOOD HAND LEFT  Final   Special Requests BOTTLES DRAWN AEROBIC AND ANAEROBIC 5ML  Final   Culture NO GROWTH 2 DAYS  Final   Report Status PENDING  Incomplete  MRSA PCR Screening     Status: None   Collection Time: 08/26/15  5:54 PM  Result Value Ref Range Status   MRSA by PCR NEGATIVE NEGATIVE Final    Comment:        The GeneXpert MRSA Assay (FDA approved for NASAL specimens only), is one component of a comprehensive MRSA colonization surveillance program. It is not intended to diagnose MRSA infection nor to guide or monitor treatment for MRSA infections.   Urine culture     Status: None   Collection Time: 08/26/15  9:19 PM  Result Value Ref Range Status   Specimen Description URINE, CLEAN CATCH  Final   Special Requests NONE  Final   Culture MULTIPLE SPECIES PRESENT, SUGGEST  RECOLLECTION  Final   Report Status 08/27/2015 FINAL  Final     Labs: Basic Metabolic Panel:  Recent Labs Lab 08/26/15 1120 08/26/15 1532 08/27/15 1000  NA 132*  --  133*  K 5.0  --  3.7  CL 96*  --  98*  CO2 26  --  26  GLUCOSE 468*  --  378*  BUN 15  --  8  CREATININE 1.21*  --  0.87  CALCIUM 8.1*  --  8.0*  MG  --  1.4* 1.6*   Liver Function Tests:  Recent Labs Lab 08/26/15 1120  AST 17  ALT 18  ALKPHOS 77  BILITOT 0.4  PROT 6.5  ALBUMIN 3.2*   No results for input(s): LIPASE, AMYLASE in the last 168 hours. No results for input(s): AMMONIA in the last 168 hours. CBC:  Recent Labs Lab 08/26/15 1120 08/27/15 0453  WBC 6.7 7.3  NEUTROABS 4.2  --   HGB 8.2* 7.8*  HCT 27.5* 25.2*  MCV 61.9* 61.9*  PLT 255 257   Cardiac Enzymes:  Recent Labs Lab 08/26/15 1120  TROPONINI <0.03   BNP: BNP (last 3 results) No results for input(s): BNP in the last 8760 hours.  ProBNP (last 3 results) No results for input(s): PROBNP in the last 8760 hours.  CBG:  Recent Labs Lab 08/27/15 2041 08/28/15 0018 08/28/15 0045 08/28/15 0412 08/28/15 0805  GLUCAP 319* 229* 238* 237* 228*       Signed:  Charlynne Cousins  [facility] 08/28/2015, 10:05 AM

## 2015-08-28 NOTE — Progress Notes (Signed)
SATURATION QUALIFICATIONS: (This note is used to comply with regulatory documentation for home oxygen)  Patient Saturations on Room Air at Rest = 100%  Patient Saturations on Room Air while Ambulating = 87%  Patient Saturations on 2 Liters of oxygen while Ambulating =95-100%  Please briefly explain why patient needs home oxygen:

## 2015-08-28 NOTE — Progress Notes (Addendum)
Nsg Discharge Note  Admit Date:  08/26/2015 Discharge date: 08/28/2015   TATIA PETRUCCI to be D/C'd Home  With oxygen per MD order.  AVS completed.  Copy for chart, and copy for patient signed, and dated. Patient/caregiver able to verbalize understanding.  Discharge Medication:   Medication List    STOP taking these medications        traMADol 50 MG tablet  Commonly known as:  ULTRAM      TAKE these medications        albuterol 90 MCG/ACT inhaler  Commonly known as:  PROVENTIL,VENTOLIN  Inhale 2 puffs into the lungs every 6 (six) hours as needed for shortness of breath. Wheezing or shortness of breath     amLODipine 5 MG tablet  Commonly known as:  NORVASC  Take 1 tablet (5 mg total) by mouth daily.     aspirin 81 MG tablet  Take 81 mg by mouth daily.     diclofenac sodium 1 % Gel  Commonly known as:  VOLTAREN  Apply 2 g topically 4 (four) times daily.     dicyclomine 10 MG capsule  Commonly known as:  BENTYL  Take 1 tab 3 times a day before meals.     ergocalciferol 50000 UNITS capsule  Commonly known as:  VITAMIN D2  Take 1 capsule (50,000 Units total) by mouth once a week. Usually takes on Saturday or fridays     famotidine 40 MG tablet  Commonly known as:  PEPCID  Take 40 mg by mouth daily.     fenofibrate 145 MG tablet  Commonly known as:  TRICOR  Take 1 tablet (145 mg total) by mouth daily.     ferrous sulfate 325 (65 FE) MG tablet  Take 1 tablet (325 mg total) by mouth daily with breakfast.     fluticasone 50 MCG/ACT nasal spray  Commonly known as:  FLONASE  Place 1 spray into both nostrils 2 (two) times daily.     fluticasone-salmeterol 115-21 MCG/ACT inhaler  Commonly known as:  ADVAIR HFA  Inhale 2 puffs into the lungs 2 (two) times daily.     HYDROcodone-acetaminophen 5-500 MG tablet  Commonly known as:  VICODIN  Take 1 tablet by mouth every 6 (six) hours as needed.     hyoscyamine 0.375 MG 12 hr tablet  Commonly known as:  LEVBID  Take  0.375 mg by mouth every 12 (twelve) hours as needed for cramping.     insulin NPH-regular Human (70-30) 100 UNIT/ML injection  Commonly known as:  NOVOLIN 70/30  Inject 40 Units into the skin daily with supper.     insulin NPH-regular Human (70-30) 100 UNIT/ML injection  Commonly known as:  NOVOLIN 70/30  Inject 60 Units into the skin daily with breakfast.     Insulin Syringes (Disposable) U-100 1 ML Misc  by Does not apply route.     ipratropium 0.02 % nebulizer solution  Commonly known as:  ATROVENT  Take 2.5 mLs (0.5 mg total) by nebulization 3 (three) times daily as needed for wheezing or shortness of breath.     Lancets Misc  by Does not apply route.     loratadine 10 MG tablet  Commonly known as:  CLARITIN  Take 10 mg by mouth daily.     metFORMIN 500 MG tablet  Commonly known as:  GLUCOPHAGE  Take 2 tablets (1,000 mg total) by mouth 2 (two) times daily with a meal.     methocarbamol 500 MG tablet  Commonly known as:  ROBAXIN  Take 1 tablet (500 mg total) by mouth 2 (two) times daily.     metoprolol 50 MG tablet  Commonly known as:  LOPRESSOR  Take 1 tablet (50 mg total) by mouth 2 (two) times daily.     NON FORMULARY  [employer] test strip( Glucose Blood)     potassium chloride 10 MEQ tablet  Commonly known as:  K-DUR  Take 1 tablet (10 mEq total) by mouth 3 (three) times daily.     promethazine 25 MG tablet  Commonly known as:  PHENERGAN  Take 0.5 tablets (12.5 mg total) by mouth every 6 (six) hours as needed for nausea or vomiting.     simvastatin 40 MG tablet  Commonly known as:  ZOCOR  Take 0.5 tablets (20 mg total) by mouth at bedtime.     tobramycin 0.3 % ophthalmic ointment  Commonly known as:  TOBREX  Place 1 application into both eyes 4 (four) times daily.     travoprost (benzalkonium) 0.004 % ophthalmic solution  Commonly known as:  TRAVATAN  Place 1 drop into both eyes at bedtime.        Discharge Assessment: Filed Vitals:   08/28/15  0640  BP: 141/75  Pulse: 70  Temp: 98.2 F (36.8 C)  Resp: 18   Skin clean, dry and intact without evidence of skin break down, no evidence of skin tears noted. IV catheter discontinued intact. Site without signs and symptoms of complications - no redness or edema noted at insertion site, patient denies c/o pain - only slight tenderness at site.  Dressing with slight pressure applied.  D/c Instructions-Education: Discharge instructions given to patient/family with verbalized understanding. D/c education completed with patient/family including follow up instructions, medication list, d/c activities limitations if indicated, with other d/c instructions as indicated by MD - patient able to verbalize understanding, all questions fully answered. Patient instructed to return to ED, call 911, or call MD for any changes in condition.  Patient escorted via [place], and D/C home via private auto.  Dayle Points, RN 08/28/2015 1:46 PM

## 2015-08-28 NOTE — Care Management Note (Signed)
Case Management Note  Patient Details  Name: MIGDALIA OLEJNICZAK MRN: [id number] Date of Birth: 1956/08/09  Subjective/Objective:                  weakness  Action/Plan:  Discharge plannning Expected Discharge Date:  08/28/15               Expected Discharge Plan:  Home/Self Care  In-House Referral:     Discharge planning Services  CM Consult, NA  Post Acute Care Choice:  NA Choice offered to:     DME Arranged:  Oxygen DME Agency:  [place]:    [facility] Agency:     Status of Service:  Completed, signed off  Medicare Important Message Given:    Date Medicare IM Given:    Medicare IM give by:    Date Additional Medicare IM Given:    Additional Medicare Important Message give by:     If discussed at [place] of Stay Meetings, dates discussed:    Additional Comments: CM met with pt in room who states she gets her CPAP from [facility].  CM called [facility] rep, Tiffany to please arrange for home O2.  Cm called [facility] DME rep, Merry Proud to lease deliver the tank to room so pt can discharge.  Pt states her CPAP mask is uncomfortable and [facility] rep relayed message to pt to GO to the [facility] office with the mask and they will evaluate fit and options. Pt will go to PCP and ask for office staff to contact MEDICAID with prescription for new glucometer and strips to come in the mail for her.   No other CM needs were communicated. Dellie Catholic, RN 08/28/2015, 1:12 PM

## 2015-08-31 LAB — CULTURE, BLOOD (ROUTINE X 2)
CULTURE: NO GROWTH
CULTURE: NO GROWTH

## 2015-10-14 ENCOUNTER — Encounter: Payer: Self-pay | Admitting: Gastroenterology

## 2015-12-21 ENCOUNTER — Ambulatory Visit (INDEPENDENT_AMBULATORY_CARE_PROVIDER_SITE_OTHER): Payer: Medicaid Other | Admitting: Physician Assistant

## 2015-12-21 ENCOUNTER — Encounter: Payer: Self-pay | Admitting: Physician Assistant

## 2015-12-21 VITALS — BP 180/100 | HR 96 | Ht 67.0 in | Wt 310.6 lb

## 2015-12-21 DIAGNOSIS — K625 Hemorrhage of anus and rectum: Secondary | ICD-10-CM

## 2015-12-21 DIAGNOSIS — K219 Gastro-esophageal reflux disease without esophagitis: Secondary | ICD-10-CM

## 2015-12-21 DIAGNOSIS — R1310 Dysphagia, unspecified: Secondary | ICD-10-CM | POA: Diagnosis not present

## 2015-12-21 DIAGNOSIS — Z86010 Personal history of colonic polyps: Secondary | ICD-10-CM | POA: Diagnosis not present

## 2015-12-21 MED ORDER — NA SULFATE-K SULFATE-MG SULF 17.5-3.13-1.6 GM/177ML PO SOLN: 1.0000 | ORAL | Status: AC

## 2015-12-21 MED ORDER — PANTOPRAZOLE SODIUM 40 MG PO TBEC: 40.0000 mg | DELAYED_RELEASE_TABLET | Freq: Every day | ORAL | Status: DC

## 2015-12-21 NOTE — Patient Instructions (Addendum)
Stop Iron 5 days prior to procedure  You have been scheduled for a colonoscopy. Please follow written instructions given to you at your visit today.  Please pick up your prep supplies at the pharmacy within the next 1-3 days. If you use inhalers (even only as needed), please bring them with you on the day of your procedure. Your physician has requested that you go to www.startemmi.com and enter the access code given to you at your visit today. This web site gives a general overview about your procedure. However, you should still follow specific instructions given to you by our office regarding your preparation for the procedure.   We have sent the following medications to your pharmacy for you to pick up at your convenience: Pantoprazole 40 mg 30 mins prior to breakfast.   We have given you a handout on an antireflux regimen.   Today your blood pressure was elevated. Please follow up with your Primary Care Provider for blood pressure management.

## 2015-12-21 NOTE — Progress Notes (Signed)
Patient ID: Chelsea Lester, female   DOB: 11/19/1955, 60 y.o.   MRN: [id number]    HPI:  Chelsea Lester is a 60 y.o.   female  referred by Nolene Ebbs, MD due to dysphagia and iron deficiency anemia. Chelsea Lester was previously followed by Dr. Deatra Ina and has a history of chronic GERD and has had previous dilations of esophageal strictures. She also has a history of diverticulosis and adenomatous colon polyps. Her last colonoscopy was in January 2011. She had an endoscopy with [facility] dilation in February 2012 for a distal stricture. She also has a history of uncontrolled diabetes , iron deficiency anemia, hypertension, asthma, GERD,  IBS, chronic back pain, prolonged QT interval , obstructive sleep apnea. Echocardiogram in 2015 showed mild systolic dysfunction and grade 1 diastolic dysfunction with an EF of 45-50%. She has some dyspnea with exertion which was felt to be related to her morbid obesity with respect to 2 physiology on PFTs. She wears home oxygen. She was recently hospitalized 08/26/2015 through 08/28/2015 when she was admitted after being found lethargic and unable to walk. He had been experiencing diarrhea. She was found to have an acute kidney injury and was started on IV fluids and her creatinine returned to baseline. She was noted to have an iron deficiency anemia in the hospital. Her ferritin was low and her FOBT was positive. She states that she occasionally has blood on the toilet tissue with bowel movements. She has not had any black stools. She reports that she has been having terrible heartburn for the past year. She has also been experiencing recurrent dysphagia for a year that has been getting worse. She states she felt well after her last dilation but her PPI was discontinued while she was on Plavix for a period of time. Since then she has not been using anything for her reflux and she has been having dysphagia to the point where she has to spit food up several days per week. She has  heartburn on a daily basis with nocturnal regurgitation.   Past Medical History  Diagnosis Date  . GERD (gastroesophageal reflux disease)   . Colon polyp   . Asthma   . CAD (coronary artery disease)   . Hypertension   . Microcytosis   . Sleep disorder   . Obesity   . Depression 11/07    Hospitalization required  . Cataract   . Diverticulosis   . Esophageal stricture   . Hemorrhoids   . IBS (irritable bowel syndrome)   . Complication of anesthesia     "couldn't get me woke when they put m3e to sleep for my teeth"  . Hyperlipidemia   . Myocardial infarction ([place])     "slight one years ago"  . Pneumonia "several times"  . Chronic bronchitis ([place])     "get it q year"  . OSA on CPAP     "haven't wore it lately; they are suppose to bwe ordering me one that I can use thru my nose" (08/26/2015)  . Thyroid disease     "? high or low" (08/26/2015)  . Type II diabetes mellitus ([place])   . Sickle-cell trait ([place])   . History of hiatal hernia   . Headache     "weekly" (08/26/2015)  . Chronic lower back pain   . Gout   . Osteoarthritis     "all over"  . Anxiety   . Abnormal findings-skull or head     "/Dr. 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 Lupus [facility])     Past Surgical History  Procedure Laterality Date  . Upper gastrointestinal endoscopy    . Colonoscopy    . Abdominal hysterectomy    . Tonsillectomy    . Coronary angioplasty with stent placement      "I''ve had alot"  . Multiple tooth extractions  ?1980's    "& cut into my jaw"  . Eye surgery Bilateral     "had surgery on the sides of my eyes to keep them popping out"  . Esophagogastroduodenoscopy (egd) with esophageal dilation  "all the time"  . Dilation and curettage of uterus    . Tubal ligation    . Head surgery      metal plate placed   Family History  Problem Relation Age of Onset  . Rectal cancer Sister   . Lung cancer Father   . Esophageal cancer Father   .  Breast cancer Sister 92  . Breast cancer Mother 65  . Colon cancer Other     Aunt, Uncle  . Liver cancer Brother     Nephew  . Diabetes Sister   . Diabetes Brother   . Diabetes Other     son & daughter  . Heart disease Sister   . Heart disease Brother   . Kidney disease Sister   . Breast cancer Maternal Aunt     patient reports 11 mat aunts with breast cancer  . Breast cancer Paternal Aunt 85  . Breast cancer Maternal Grandmother     unk age   Social History  Substance Use Topics  . Smoking status: Never Smoker   . Smokeless tobacco: Never Used  . Alcohol Use: No   Current Outpatient Prescriptions  Medication Sig Dispense Refill  . albuterol (PROVENTIL,VENTOLIN) 90 MCG/ACT inhaler Inhale 2 puffs into the lungs every 6 (six) hours as needed for shortness of breath. Wheezing or shortness of breath    . amLODipine (NORVASC) 5 MG tablet Take 1 tablet (5 mg total) by mouth daily. 30 tablet 0  . aspirin 81 MG tablet Take 81 mg by mouth daily.      . diclofenac sodium (VOLTAREN) 1 % GEL Apply 2 g topically 4 (four) times daily.    Marland Kitchen dicyclomine (BENTYL) 10 MG capsule Take 1 tab 3 times a day before meals. 90 capsule 1  . ergocalciferol (VITAMIN D2) 50000 UNITS capsule Take 1 capsule (50,000 Units total) by mouth once a week. Usually takes on Saturday or fridays 30 capsule 0  . famotidine (PEPCID) 40 MG tablet Take 40 mg by mouth daily.    . fenofibrate (TRICOR) 145 MG tablet Take 1 tablet (145 mg total) by mouth daily. 30 tablet 0  . ferrous sulfate 325 (65 FE) MG tablet Take 1 tablet (325 mg total) by mouth daily with breakfast. 90 tablet 3  . fluticasone (FLONASE) 50 MCG/ACT nasal spray Place 1 spray into both nostrils 2 (two) times daily. 1 g 2  . fluticasone-salmeterol (ADVAIR HFA) 115-21 MCG/ACT inhaler Inhale 2 puffs into the lungs 2 (two) times daily. 1 Inhaler 12  . HYDROcodone-acetaminophen (VICODIN) 5-500 MG tablet Take 1 tablet by mouth every 6 (six) hours as needed. 10  tablet 0  . hyoscyamine (LEVBID) 0.375 MG 12 hr tablet Take 0.375 mg by mouth every 12 (twelve) hours as needed for cramping.    . insulin NPH-regular Human (NOVOLIN 70/30) (70-30) 100 UNIT/ML injection Inject 40 Units into  the skin daily with supper. 10 mL 11  . insulin NPH-regular Human (NOVOLIN 70/30) (70-30) 100 UNIT/ML injection Inject 60 Units into the skin daily with breakfast. 10 mL 11  . Insulin Syringes, Disposable, U-100 1 ML MISC by Does not apply route.      Marland Kitchen ipratropium (ATROVENT) 0.02 % nebulizer solution Take 2.5 mLs (0.5 mg total) by nebulization 3 (three) times daily as needed for wheezing or shortness of breath. 75 mL 12  . Lancets MISC by Does not apply route.      . loratadine (CLARITIN) 10 MG tablet Take 10 mg by mouth daily.     . metFORMIN (GLUCOPHAGE) 500 MG tablet Take 2 tablets (1,000 mg total) by mouth 2 (two) times daily with a meal. 60 tablet 3  . methocarbamol (ROBAXIN) 500 MG tablet Take 1 tablet (500 mg total) by mouth 2 (two) times daily. 20 tablet 0  . metoprolol (LOPRESSOR) 50 MG tablet Take 1 tablet (50 mg total) by mouth 2 (two) times daily. 30 tablet 0  . NON FORMULARY [employer] test strip( Glucose Blood)     . potassium chloride (K-DUR) 10 MEQ tablet Take 1 tablet (10 mEq total) by mouth 3 (three) times daily. 45 tablet 0  . promethazine (PHENERGAN) 25 MG tablet Take 0.5 tablets (12.5 mg total) by mouth every 6 (six) hours as needed for nausea or vomiting. (Patient taking differently: Take 25 mg by mouth every 6 (six) hours as needed for nausea or vomiting. ) 30 tablet 0  . simvastatin (ZOCOR) 40 MG tablet Take 0.5 tablets (20 mg total) by mouth at bedtime. 30 tablet 0  . tobramycin (TOBREX) 0.3 % ophthalmic ointment Place 1 application into both eyes 4 (four) times daily.     . travoprost, benzalkonium, (TRAVATAN) 0.004 % ophthalmic solution Place 1 drop into both eyes at bedtime. 2.5 mL 12   No current facility-administered medications for this visit.    Allergies  Allergen Reactions  . Latex Rash    unknown  . Percocet [Oxycodone-Acetaminophen] Itching          Review of Systems:  per history of present illness otherwise noncontributory.   LAB RESULTS:  blood work October 21 showed an iron of 20, TIBC 337, saturation 6. U IBC 317. Ferritin 14.  CBC 08/27/2015 had WBC 7.3, hemoglobin 7.8, hematocrit 25.2, MCV 61.9, platelets 257,000. Prior Endoscopies:   See history of present illness  Physical Exam: BP 174/112 mmHg  Pulse 96  Ht 5\' 7"  (1.702 m)  Wt 310 lb 9.6 oz (140.887 kg)  BMI 48.64 kg/m2 Constitutional: Pleasant, obesefemale in no acute distress. HEENT: Normocephalic and atraumatic. Conjunctivae are normal. No scleral icterus. Neck supple.  Cardiovascular: Normal rate, regular rhythm.  Pulmonary/chest: Effort normal and breath sounds normal. No wheezing, rales or rhonchi. Abdominal: Soft, nondistended, nontender. Bowel sounds active throughout. There are no masses palpable. No hepatomegaly. Extremities: no edema Lymphadenopathy: No cervical adenopathy noted. Neurological: Alert and oriented to person place and time. Skin: Skin is warm and dry. No rashes noted. Psychiatric: Normal mood and affect. Behavior is normal.  ASSESSMENT AND PLAN:  60 year old female with a personal history of adenomatous colon polyps, due for surveillance colonoscopy. Referred for evaluation. She has also been noted to have rectal bleeding and an iron deficiency anemia. She will be scheduled for a colonoscopy to assess for recurrent polyps, neoplasia, AVMs, etc.The risks, benefits, and alternatives to colonoscopy with possible biopsy and possible polypectomy were discussed with the patient and they  consent to proceed.  She also has a long-standing history of GERD and has been having frequent heartburn. An antireflux regimen has been reviewed. She will be given a trial of pantoprazole 40 mg by mouth every morning 30 minutes prior to breakfast. She  will be scheduled for an EGD to evaluate for esophagitis, gastritis, ulcer etc. As well as to dilate any strictures if noted.The risks, benefits, and alternatives to endoscopy with possible biopsy and possible dilation were discussed with the patient and they consent to proceed.   The procedures will be scheduled with Dr. Silverio Decamp per patient request. Further recommendations will be made pending the findings of the above.    Chelsea Lester, Deloris Ping 12/21/2015, 3:55 PM  CC: Nolene Ebbs, MD

## 2015-12-23 NOTE — Progress Notes (Signed)
Reviewed and agree with documentation and assessment and plan. K. Veena Nadeem Romanoski , MD   

## 2015-12-29 ENCOUNTER — Encounter: Payer: Medicaid Other | Admitting: Gastroenterology

## 2016-01-17 ENCOUNTER — Encounter (HOSPITAL_COMMUNITY): Payer: Self-pay

## 2016-01-17 ENCOUNTER — Ambulatory Visit (HOSPITAL_COMMUNITY): Admit: 2016-01-17 | Payer: Self-pay | Admitting: Gastroenterology

## 2016-01-17 SURGERY — ESOPHAGOGASTRODUODENOSCOPY (EGD) WITH PROPOFOL
Anesthesia: Monitor Anesthesia Care

## 2016-01-26 ENCOUNTER — Emergency Department (HOSPITAL_COMMUNITY)
Admission: EM | Admit: 2016-01-26 | Discharge: 2016-01-26 | Disposition: A | Discharge status: Home / Self Care | Payer: Medicaid Other | Attending: Emergency Medicine | Admitting: Emergency Medicine

## 2016-01-26 ENCOUNTER — Encounter (HOSPITAL_COMMUNITY): Payer: Self-pay

## 2016-01-26 DIAGNOSIS — Z792 Long term (current) use of antibiotics: Secondary | ICD-10-CM | POA: Diagnosis not present

## 2016-01-26 DIAGNOSIS — I2510 Atherosclerotic heart disease of native coronary artery without angina pectoris: Secondary | ICD-10-CM | POA: Insufficient documentation

## 2016-01-26 DIAGNOSIS — R1013 Epigastric pain: Secondary | ICD-10-CM | POA: Diagnosis present

## 2016-01-26 DIAGNOSIS — I252 Old myocardial infarction: Secondary | ICD-10-CM | POA: Insufficient documentation

## 2016-01-26 DIAGNOSIS — Z7984 Long term (current) use of oral hypoglycemic drugs: Secondary | ICD-10-CM | POA: Insufficient documentation

## 2016-01-26 DIAGNOSIS — Z9851 Tubal ligation status: Secondary | ICD-10-CM | POA: Diagnosis not present

## 2016-01-26 DIAGNOSIS — Z9981 Dependence on supplemental oxygen: Secondary | ICD-10-CM | POA: Insufficient documentation

## 2016-01-26 DIAGNOSIS — Z86010 Personal history of colonic polyps: Secondary | ICD-10-CM | POA: Diagnosis not present

## 2016-01-26 DIAGNOSIS — Z7982 Long term (current) use of aspirin: Secondary | ICD-10-CM | POA: Diagnosis not present

## 2016-01-26 DIAGNOSIS — I10 Essential (primary) hypertension: Secondary | ICD-10-CM | POA: Insufficient documentation

## 2016-01-26 DIAGNOSIS — M1990 Unspecified osteoarthritis, unspecified site: Secondary | ICD-10-CM | POA: Diagnosis not present

## 2016-01-26 DIAGNOSIS — Z862 Personal history of diseases of the blood and blood-forming organs and certain disorders involving the immune mechanism: Secondary | ICD-10-CM | POA: Insufficient documentation

## 2016-01-26 DIAGNOSIS — R197 Diarrhea, unspecified: Secondary | ICD-10-CM | POA: Diagnosis not present

## 2016-01-26 DIAGNOSIS — Z7951 Long term (current) use of inhaled steroids: Secondary | ICD-10-CM | POA: Insufficient documentation

## 2016-01-26 DIAGNOSIS — Z8701 Personal history of pneumonia (recurrent): Secondary | ICD-10-CM | POA: Insufficient documentation

## 2016-01-26 DIAGNOSIS — J45909 Unspecified asthma, uncomplicated: Secondary | ICD-10-CM | POA: Insufficient documentation

## 2016-01-26 DIAGNOSIS — Z91040 Latex allergy status: Secondary | ICD-10-CM | POA: Diagnosis not present

## 2016-01-26 DIAGNOSIS — M549 Dorsalgia, unspecified: Secondary | ICD-10-CM | POA: Diagnosis not present

## 2016-01-26 DIAGNOSIS — E119 Type 2 diabetes mellitus without complications: Secondary | ICD-10-CM | POA: Diagnosis not present

## 2016-01-26 DIAGNOSIS — Z794 Long term (current) use of insulin: Secondary | ICD-10-CM | POA: Diagnosis not present

## 2016-01-26 DIAGNOSIS — F419 Anxiety disorder, unspecified: Secondary | ICD-10-CM | POA: Insufficient documentation

## 2016-01-26 DIAGNOSIS — K219 Gastro-esophageal reflux disease without esophagitis: Secondary | ICD-10-CM | POA: Insufficient documentation

## 2016-01-26 DIAGNOSIS — R112 Nausea with vomiting, unspecified: Secondary | ICD-10-CM | POA: Insufficient documentation

## 2016-01-26 DIAGNOSIS — Z9861 Coronary angioplasty status: Secondary | ICD-10-CM | POA: Insufficient documentation

## 2016-01-26 DIAGNOSIS — Z90710 Acquired absence of both cervix and uterus: Secondary | ICD-10-CM | POA: Diagnosis not present

## 2016-01-26 DIAGNOSIS — Z791 Long term (current) use of non-steroidal anti-inflammatories (NSAID): Secondary | ICD-10-CM | POA: Insufficient documentation

## 2016-01-26 DIAGNOSIS — E785 Hyperlipidemia, unspecified: Secondary | ICD-10-CM | POA: Diagnosis not present

## 2016-01-26 DIAGNOSIS — E669 Obesity, unspecified: Secondary | ICD-10-CM | POA: Insufficient documentation

## 2016-01-26 DIAGNOSIS — Z79899 Other long term (current) drug therapy: Secondary | ICD-10-CM | POA: Insufficient documentation

## 2016-01-26 DIAGNOSIS — G4733 Obstructive sleep apnea (adult) (pediatric): Secondary | ICD-10-CM | POA: Diagnosis not present

## 2016-01-26 DIAGNOSIS — K529 Noninfective gastroenteritis and colitis, unspecified: Secondary | ICD-10-CM

## 2016-01-26 LAB — URINALYSIS, ROUTINE W REFLEX MICROSCOPIC
BILIRUBIN URINE: NEGATIVE
Glucose, UA: NEGATIVE mg/dL
Hgb urine dipstick: NEGATIVE
KETONES UR: NEGATIVE mg/dL
LEUKOCYTES UA: NEGATIVE
NITRITE: NEGATIVE
PH: 6.5 (ref 5.0–8.0)
Protein, ur: 30 mg/dL — AB
SPECIFIC GRAVITY, URINE: 1.013 (ref 1.005–1.030)

## 2016-01-26 LAB — CBC
HEMATOCRIT: 30 % — AB (ref 36.0–46.0)
HEMOGLOBIN: 9.5 g/dL — AB (ref 12.0–15.0)
MCH: 20.6 pg — ABNORMAL LOW (ref 26.0–34.0)
MCHC: 31.7 g/dL (ref 30.0–36.0)
MCV: 64.9 fL — ABNORMAL LOW (ref 78.0–100.0)
Platelets: 275 10*3/uL (ref 150–400)
RBC: 4.62 MIL/uL (ref 3.87–5.11)
RDW: 20.2 % — ABNORMAL HIGH (ref 11.5–15.5)
WBC: 5.7 10*3/uL (ref 4.0–10.5)

## 2016-01-26 LAB — URINE MICROSCOPIC-ADD ON

## 2016-01-26 LAB — COMPREHENSIVE METABOLIC PANEL
ALBUMIN: 3.9 g/dL (ref 3.5–5.0)
ALK PHOS: 54 U/L (ref 38–126)
ALT: 21 U/L (ref 14–54)
ANION GAP: 9 (ref 5–15)
AST: 14 U/L — ABNORMAL LOW (ref 15–41)
BILIRUBIN TOTAL: 0.6 mg/dL (ref 0.3–1.2)
BUN: 11 mg/dL (ref 6–20)
CALCIUM: 8.9 mg/dL (ref 8.9–10.3)
CO2: 27 mmol/L (ref 22–32)
Chloride: 109 mmol/L (ref 101–111)
Creatinine, Ser: 0.79 mg/dL (ref 0.44–1.00)
Glucose, Bld: 71 mg/dL (ref 65–99)
POTASSIUM: 3.8 mmol/L (ref 3.5–5.1)
Sodium: 145 mmol/L (ref 135–145)
TOTAL PROTEIN: 7.4 g/dL (ref 6.5–8.1)

## 2016-01-26 LAB — LIPASE, BLOOD: Lipase: 40 U/L (ref 11–51)

## 2016-01-26 MED ORDER — ONDANSETRON 4 MG PO TBDP
4.0000 mg | ORAL_TABLET | Freq: Once | ORAL | Status: AC
Start: 2016-01-26 — End: 2016-01-26
  Administered 2016-01-26: 4 mg via ORAL
  Filled 2016-01-26: qty 1

## 2016-01-26 MED ORDER — SODIUM CHLORIDE 0.9 % IV BOLUS (SEPSIS): 1000.0000 mL | Freq: Once | INTRAVENOUS | Status: DC

## 2016-01-26 MED ORDER — ONDANSETRON 4 MG PO TBDP: ORAL_TABLET | ORAL | Status: DC

## 2016-01-26 MED ORDER — ONDANSETRON HCL 4 MG/2ML IJ SOLN: 4.0000 mg | Freq: Once | INTRAMUSCULAR | Status: DC

## 2016-01-26 NOTE — ED Notes (Addendum)
Pt with abdominal pain, back pain, dizziness, nausea, emesis, diarrhea yesterday and some last week.  Hot and cold.  Unknown for fever.  Symptoms x 3 weeks.  Pt also has dark stool for a month and has appt for follow up at end of month.  Pt explains other complaints lasting over a month.  Headaches, low blood (per home nurse). States on home oxygen.  Not wearing.  Sats 100% on RA and in no distress in triage.  BP elevated.  PT is taking her bp meds.

## 2016-01-26 NOTE — ED Notes (Signed)
Called for patient in lobby a third time, no answer, will attempt once more since they were in bathroom earlier and are no longer in there.

## 2016-01-26 NOTE — ED Notes (Signed)
PT did not answer to first attempt name called

## 2016-01-26 NOTE — ED Notes (Signed)
Called for patient in lobby x 2, patient with family member using the bathroom, I told them to come back and let me know when they were done.

## 2016-01-26 NOTE — ED Provider Notes (Signed)
CSN: [id number]     Arrival date & time 01/26/16  1425 History   First MD Initiated Contact with Patient 01/26/16 1612     Chief Complaint  Patient presents with  . Abdominal Pain  . Back Pain     (Consider location/radiation/quality/duration/timing/severity/associated sxs/prior Treatment) Patient is a 60 y.o. female presenting with abdominal pain and back pain.  Abdominal Pain Pain location:  Epigastric Pain quality: sharp   Pain radiates to:  Back Pain severity:  Mild Onset quality:  Gradual Duration:  8 weeks Timing:  Constant Progression:  Unchanged Chronicity:  New Relieved by:  Nothing Worsened by:  Nothing tried Ineffective treatments:  None tried Associated symptoms: diarrhea, nausea and vomiting   Associated symptoms: no chest pain, no chills, no dysuria, no fever and no shortness of breath   Back Pain Associated symptoms: abdominal pain   Associated symptoms: no chest pain, no dysuria, no fever and no headaches    60 yo F With a chief complaints of epigastric abdominal pain. This been going on for about 3 months. Patient has had chronic diarrhea as well. There is some concern for a upper GI bleed. Patient has a scheduled colonoscopy that is in 6 days. Patient feels that her nausea is gotten mildly worse over the past weeks and felt that she needed to be evaluated today. Called her doctor's office but was unable to be seen there today suggested she come to the ED. Denies any fevers or chills. Denies any specific worsening of any of her symptoms.  Past Medical History  Diagnosis Date  . GERD (gastroesophageal reflux disease)   . Colon polyp   . Asthma   . CAD (coronary artery disease)   . Hypertension   . Microcytosis   . Sleep disorder   . Obesity   . Depression 11/07    Hospitalization required  . Cataract   . Diverticulosis   . Esophageal stricture   . Hemorrhoids   . IBS (irritable bowel syndrome)   . Complication of anesthesia     "couldn't get me woke  when they put m3e to sleep for my teeth"  . Hyperlipidemia   . Myocardial infarction ([place])     "slight one years ago"  . Pneumonia "several times"  . Chronic bronchitis ([place])     "get it q year"  . OSA on CPAP     "haven't wore it lately; they are suppose to bwe ordering me one that I can use thru my nose" (08/26/2015)  . Thyroid disease     "? high or low" (08/26/2015)  . Type II diabetes mellitus ([place])   . Sickle-cell trait ([place])   . History of hiatal hernia   . Headache     "weekly" (08/26/2015)  . Chronic lower back pain   . Gout   . Osteoarthritis     "all over"  . Anxiety   . Abnormal findings-skull or head     "/Dr. 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 Lupus [facility])    Past Surgical History  Procedure Laterality Date  . Upper gastrointestinal endoscopy    . Colonoscopy    . Abdominal hysterectomy    . Tonsillectomy    . Coronary angioplasty with stent placement      "I''ve had alot"  . Multiple tooth extractions  ?1980's    "& cut into my jaw"  . Eye surgery Bilateral     "had surgery on  the sides of my eyes to keep them popping out"  . Esophagogastroduodenoscopy (egd) with esophageal dilation  "all the time"  . Dilation and curettage of uterus    . Tubal ligation    . Head surgery      metal plate placed   Family History  Problem Relation Age of Onset  . Rectal cancer Sister   . Lung cancer Father   . Esophageal cancer Father   . Breast cancer Sister 39  . Breast cancer Mother 42  . Colon cancer Other     Aunt, Uncle  . Liver cancer Brother     Nephew  . Diabetes Sister   . Diabetes Brother   . Diabetes Other     son & daughter  . Heart disease Sister   . Heart disease Brother   . Kidney disease Sister   . Breast cancer Maternal Aunt     patient reports 11 mat aunts with breast cancer  . Breast cancer Paternal Aunt 85  . Breast cancer Maternal Grandmother     unk age   Social History  Substance Use  Topics  . Smoking status: Never Smoker   . Smokeless tobacco: Never Used  . Alcohol Use: No   OB History    No data available     Review of Systems  Constitutional: Negative for fever and chills.  HENT: Negative for congestion and rhinorrhea.   Eyes: Negative for redness and visual disturbance.  Respiratory: Negative for shortness of breath and wheezing.   Cardiovascular: Negative for chest pain and palpitations.  Gastrointestinal: Positive for nausea, vomiting, abdominal pain and diarrhea.  Genitourinary: Negative for dysuria and urgency.  Musculoskeletal: Positive for back pain. Negative for myalgias and arthralgias.  Skin: Negative for pallor and wound.  Neurological: Negative for dizziness and headaches.      Allergies  Latex and Percocet  Home Medications   Prior to Admission medications   Medication Sig Start Date End Date Taking? Authorizing Provider  albuterol (PROVENTIL,VENTOLIN) 90 MCG/ACT inhaler Inhale 2 puffs into the lungs every 6 (six) hours as needed for shortness of breath. Wheezing or shortness of breath    Historical Provider, MD  amLODipine (NORVASC) 5 MG tablet Take 1 tablet (5 mg total) by mouth daily. 08/28/15   Charlynne Cousins, MD  aspirin 81 MG tablet Take 81 mg by mouth daily.      Historical Provider, MD  diclofenac sodium (VOLTAREN) 1 % GEL Apply 2 g topically 4 (four) times daily.    Historical Provider, MD  dicyclomine (BENTYL) 10 MG capsule Take 1 tab 3 times a day before meals. 08/28/15   Charlynne Cousins, MD  ergocalciferol (VITAMIN D2) 50000 UNITS capsule Take 1 capsule (50,000 Units total) by mouth once a week. Usually takes on Saturday or fridays 08/28/15   Charlynne Cousins, MD  famotidine (PEPCID) 40 MG tablet Take 40 mg by mouth daily.    Historical Provider, MD  fenofibrate (TRICOR) 145 MG tablet Take 1 tablet (145 mg total) by mouth daily. 08/28/15   Charlynne Cousins, MD  ferrous sulfate 325 (65 FE) MG tablet Take 1 tablet  (325 mg total) by mouth daily with breakfast. 08/28/15   Charlynne Cousins, MD  fluticasone [facility]) 50 MCG/ACT nasal spray Place 1 spray into both nostrils 2 (two) times daily. 08/28/15   Charlynne Cousins, MD  fluticasone-salmeterol (ADVAIR HFA) [phone] MCG/ACT inhaler Inhale 2 puffs into the lungs 2 (two) times daily. 08/28/15  Charlynne Cousins, MD  HYDROcodone-acetaminophen (VICODIN) 5-500 MG tablet Take 1 tablet by mouth every 6 (six) hours as needed. 08/28/15   Charlynne Cousins, MD  hyoscyamine (LEVBID) 0.375 MG 12 hr tablet Take 0.375 mg by mouth every 12 (twelve) hours as needed for cramping.    Historical Provider, MD  insulin NPH-regular Human (NOVOLIN 70/30) (70-30) 100 UNIT/ML injection Inject 40 Units into the skin daily with supper. 08/28/15   Charlynne Cousins, MD  insulin NPH-regular Human (NOVOLIN 70/30) (70-30) 100 UNIT/ML injection Inject 60 Units into the skin daily with breakfast. 08/28/15   Charlynne Cousins, MD  Insulin Syringes, Disposable, U-100 1 ML MISC by Does not apply route.      Historical Provider, MD  ipratropium (ATROVENT) 0.02 % nebulizer solution Take 2.5 mLs (0.5 mg total) by nebulization 3 (three) times daily as needed for wheezing or shortness of breath. 08/28/15   Charlynne Cousins, MD  Lancets MISC by Does not apply route.      Historical Provider, MD  loratadine (CLARITIN) 10 MG tablet Take 10 mg by mouth daily.     Historical Provider, MD  metFORMIN (GLUCOPHAGE) 500 MG tablet Take 2 tablets (1,000 mg total) by mouth 2 (two) times daily with a meal. 08/28/15   Charlynne Cousins, MD  methocarbamol (ROBAXIN) 500 MG tablet Take 1 tablet (500 mg total) by mouth 2 (two) times daily. 03/22/14   Antonietta Breach, PA-C  metoprolol (LOPRESSOR) 50 MG tablet Take 1 tablet (50 mg total) by mouth 2 (two) times daily. 08/28/15   Charlynne Cousins, MD  NON FORMULARY [employer] test strip( Glucose Blood)     Historical Provider, MD  ondansetron (ZOFRAN ODT) 4 MG  disintegrating tablet 4mg  ODT q4 hours prn nausea/vomit 01/26/16   Deno Etienne, DO  pantoprazole (PROTONIX) 40 MG tablet Take 1 tablet (40 mg total) by mouth daily. 30 mins prior to breakfast. 12/21/15   Lori P Hvozdovic, PA-C  potassium chloride (K-DUR) 10 MEQ tablet Take 1 tablet (10 mEq total) by mouth 3 (three) times daily. 08/28/15   Charlynne Cousins, MD  promethazine (PHENERGAN) 25 MG tablet Take 0.5 tablets (12.5 mg total) by mouth every 6 (six) hours as needed for nausea or vomiting. Patient taking differently: Take 25 mg by mouth every 6 (six) hours as needed for nausea or vomiting.  03/11/14   Dixie Dials, MD  simvastatin (ZOCOR) 40 MG tablet Take 0.5 tablets (20 mg total) by mouth at bedtime. 08/28/15   Charlynne Cousins, MD  tobramycin (TOBREX) 0.3 % ophthalmic ointment Place 1 application into both eyes 4 (four) times daily.     Historical Provider, MD  travoprost, benzalkonium, (TRAVATAN) 0.004 % ophthalmic solution Place 1 drop into both eyes at bedtime. 08/28/15   Charlynne Cousins, MD   BP 195/113 mmHg  Pulse 82  Temp(Src) 98.2 F (36.8 C) (Oral)  Resp 20  SpO2 100% Physical Exam  Constitutional: She is oriented to person, place, and time. She appears well-developed and well-nourished. No distress.  HENT:  Head: Normocephalic and atraumatic.  Eyes: EOM are normal. Pupils are equal, round, and reactive to light.  Neck: Normal range of motion. Neck supple.  Cardiovascular: Normal rate and regular rhythm.  Exam reveals no gallop and no friction rub.   No murmur heard. Pulmonary/Chest: Effort normal. She has no wheezes. She has no rales.  Abdominal: Soft. She exhibits no distension. There is no tenderness. There is no rebound and no guarding.  Musculoskeletal: She exhibits no edema or tenderness.  Neurological: She is alert and oriented to person, place, and time.  Skin: Skin is warm and dry. She is not diaphoretic.  Psychiatric: She has a normal mood and affect. Her  behavior is normal.  Nursing note and vitals reviewed.   ED Course  Procedures (including critical care time) Labs Review Labs Reviewed  COMPREHENSIVE METABOLIC PANEL - Abnormal; Notable for the following:    AST 14 (*)    All other components within normal limits  CBC - Abnormal; Notable for the following:    Hemoglobin 9.5 (*)    HCT 30.0 (*)    MCV 64.9 (*)    MCH 20.6 (*)    RDW 20.2 (*)    All other components within normal limits  LIPASE, BLOOD  URINALYSIS, ROUTINE W REFLEX MICROSCOPIC (NOT AT [facility])    Imaging Review No results found. I have personally reviewed and evaluated these images and lab results as part of my medical decision-making.   EKG Interpretation None      MDM   Final diagnoses:  Chronic diarrhea    60 yo F With a chief complaint of chronic diarrhea and abdominal pain. This is essentially unchanged when discussing with the patient. Hemoglobin is above her baseline. Labs are otherwise unremarkable. We'll give Zofran PO trial .   Able to tolerate PO, d/c home.   5:23 PM:  I have discussed the diagnosis/risks/treatment options with the patient and family and believe the pt to be eligible for discharge home to follow-up with GI. We also discussed returning to the ED immediately if new or worsening sx occur. We discussed the sx which are most concerning (e.g., sudden worsening pain, fever, inability to tolerate by mouth) that necessitate immediate return. Medications administered to the patient during their visit and any new prescriptions provided to the patient are listed below.  Medications given during this visit Medications  ondansetron (ZOFRAN-ODT) disintegrating tablet 4 mg (4 mg Oral Given 01/26/16 1654)    New Prescriptions   ONDANSETRON (ZOFRAN ODT) 4 MG DISINTEGRATING TABLET    4mg  ODT q4 hours prn nausea/vomit    The patient appears reasonably screen and/or stabilized for discharge and I doubt any other medical condition or other [facility]  requiring further screening, evaluation, or treatment in the ED at this time prior to discharge.    Deno Etienne, DO 01/26/16 1723

## 2016-01-26 NOTE — Discharge Instructions (Signed)
Chronic Diarrhea Diarrhea is frequent loose and watery bowel movements. It can cause you to feel weak and dehydrated. Dehydration can cause you to become tired and thirsty and to have a dry mouth, decreased urination, and dark yellow urine. Diarrhea is a sign of another problem, most often an infection that will not last long. In most cases, diarrhea lasts 2-3 days. Diarrhea that lasts longer than 4 weeks is called long-lasting (chronic) diarrhea. It is important to treat your diarrhea as directed by your health care provider to lessen or prevent future episodes of diarrhea.  CAUSES  There are many causes of chronic diarrhea. The following are some possible causes:   Gastrointestinal infections caused by viruses, bacteria, or parasites.   Food poisoning or food allergies.   Certain medicines, such as antibiotics, chemotherapy, and laxatives.   Artificial sweeteners and fructose.   Digestive disorders, such as celiac disease and inflammatory bowel diseases.   Irritable bowel syndrome.  Some disorders of the pancreas.  Disorders of the thyroid.  Reduced blood flow to the intestines.  Cancer. Sometimes the cause of chronic diarrhea is unknown. RISK FACTORS  Having a severely weakened immune system, such as from HIV or AIDS.   Taking certain types of cancer-fighting drugs (such as with chemotherapy) or other medicines.   Having had a recent organ transplant.   Having a portion of the stomach or small bowel removed.   Traveling to countries where food and water supplies are often contaminated.  SYMPTOMS  In addition to frequent, loose stools, diarrhea may cause:   Cramping.   Abdominal pain.   Nausea.   Fever.  Fatigue.  Urgent need to use the bathroom.  Loss of bowel control. DIAGNOSIS  Your health care provider must take a careful history and perform a physical exam. Tests given are based on your symptoms and history. Tests may include:   Blood or  stool tests. Three or more stool samples may be examined. Stool cultures may be used to test for bacteria or parasites.   X-rays.   A procedure in which a thin tube is inserted into the mouth or rectum (endoscopy). This allows the health care provider to look inside the intestine.  TREATMENT   Treatment is aimed at correcting the cause of the diarrhea when possible.  Diarrhea caused by an infection can often be treated with antibiotic medicines.  Diarrhea not caused by an infection may require you to take long-term medicine or have surgery. Specific treatment should be discussed with your health care provider.  If the cause cannot be determined, treatment aims to relieve symptoms and prevent dehydration. Serious health problems can occur if you do not maintain proper fluid levels. Treatment may include:  Taking an oral rehydration solution (ORS).  Not drinking beverages that contain caffeine (such as tea, coffee, and soft drinks).  Not drinking alcohol.  Maintaining well-balanced nutrition to help you recover faster. HOME CARE INSTRUCTIONS   Drink enough fluids to keep urine clear or pale yellow. Drink 1 cup (8 oz) of fluid for each diarrhea episode. Avoid fluids that contain simple sugars, fruit juices, whole milk products, and sodas. Hydrate with an ORS. You may purchase the ORS or prepare it at home by mixing the following ingredients together:   - tsp (1.7-3  mL) table salt.   tsp (3  mL) baking soda.   tsp (1.7 mL) salt substitute containing potassium chloride.  1 tbsp (20 mL) sugar.  4.2 c (1 L) of water.     Certain foods and beverages may increase the speed at which food moves through the gastrointestinal (GI) tract. These foods and beverages should be avoided. They include:  Caffeinated and alcoholic beverages.  High-fiber foods, such as raw fruits and vegetables, nuts, seeds, and whole grain breads and cereals.  Foods and beverages sweetened with sugar  alcohols, such as xylitol, sorbitol, and mannitol.   Some foods may be well tolerated and may help thicken stool. These include:  Starchy foods, such as rice, toast, pasta, low-sugar cereal, oatmeal, grits, baked potatoes, crackers, and bagels.  Bananas.  Applesauce.  Add probiotic-rich foods to help increase healthy bacteria in the GI tract. These include yogurt and fermented milk products.  Wash your hands well after each diarrhea episode.  Only take over-the-counter or prescription medicines as directed by your health care provider.  Take a warm bath to relieve any burning or pain from frequent diarrhea episodes. SEEK MEDICAL CARE IF:   You are not urinating as often.  Your urine is a dark color.  You become very tired or dizzy.  You have severe pain in the abdomen or rectum.  Your have blood or pus in your stools.  Your stools look black and tarry. SEEK IMMEDIATE MEDICAL CARE IF:   You are unable to keep fluids down.  You have persistent vomiting.  You have blood in your stool.  Your stools are black and tarry.  You do not urinate in 6-8 hours, or there is only a small amount of very dark urine.  You have abdominal pain that increases or localizes.  You have weakness, dizziness, confusion, or lightheadedness.  You have a severe headache.  Your diarrhea gets worse or does not get better.  You have a fever or persistent symptoms for more than 2-3 days.  You have a fever and your symptoms suddenly get worse. MAKE SURE YOU:   Understand these instructions.  Will watch your condition.  Will get help right away if you are not doing well or get worse.   This information is not intended to replace advice given to you by your health care provider. Make sure you discuss any questions you have with your health care provider.   Document Released: 01/12/2004 Document Revised: 10/27/2013 Document Reviewed: 04/16/2013 Elsevier Interactive Patient Education 2016  [employer] Inc[employer]  

## 2016-01-31 ENCOUNTER — Telehealth: Payer: Self-pay | Admitting: Gastroenterology

## 2016-01-31 NOTE — Telephone Encounter (Signed)
[name], do you have any Suprep she can have?

## 2016-01-31 NOTE — Progress Notes (Signed)
I was unable to reach patient by phone.  I left  A message on voice mail.  I instructed the patient to arrive at [place] entrance at 1115   , nothing to eat or drink after midnight.   I instructed the patient to not take any medications in am because we do not have any up datedmedication list.  I asked patient to not wear any lotions, powders, cologne, jewelry, piercing, make-up or nail polish.   I asked the patient to call [phone] in the am if there were any questions or problems.  I instructed patient to check CBG to check CBG and if it is less than 70 to treat it with Glucose Gel, Glucose tablets or 1/2 cup of clear juice like apple juice  or 1/2 cup of regular soda. (not cream soda). I instructed patient to recheck CBG in 15 minutes and if CBG is not greater than 70, to  Call [phone] (pre- op). If it is before pre-op opens to retreat as before and recheck CBG in 15 minutes. I told patient to make note of time that liquid is taken and amount, that surgical time may have to be adjusted.

## 2016-02-01 ENCOUNTER — Ambulatory Visit (HOSPITAL_COMMUNITY): Payer: Medicaid Other | Admitting: Anesthesiology

## 2016-02-01 ENCOUNTER — Encounter (HOSPITAL_COMMUNITY): Payer: Self-pay | Admitting: Gastroenterology

## 2016-02-01 ENCOUNTER — Encounter (HOSPITAL_COMMUNITY)
Admission: RE | Disposition: A | Discharge status: Home / Self Care | Payer: Self-pay | Source: Ambulatory Visit | Attending: Gastroenterology

## 2016-02-01 ENCOUNTER — Ambulatory Visit (HOSPITAL_COMMUNITY)
Admission: RE | Admit: 2016-02-01 | Discharge: 2016-02-01 | Disposition: A | Discharge status: Home / Self Care | Payer: Medicaid Other | Source: Ambulatory Visit | Attending: Gastroenterology | Admitting: Gastroenterology

## 2016-02-01 DIAGNOSIS — Z7982 Long term (current) use of aspirin: Secondary | ICD-10-CM | POA: Insufficient documentation

## 2016-02-01 DIAGNOSIS — K219 Gastro-esophageal reflux disease without esophagitis: Secondary | ICD-10-CM | POA: Insufficient documentation

## 2016-02-01 DIAGNOSIS — I10 Essential (primary) hypertension: Secondary | ICD-10-CM | POA: Diagnosis not present

## 2016-02-01 DIAGNOSIS — E119 Type 2 diabetes mellitus without complications: Secondary | ICD-10-CM | POA: Insufficient documentation

## 2016-02-01 DIAGNOSIS — D573 Sickle-cell trait: Secondary | ICD-10-CM | POA: Diagnosis not present

## 2016-02-01 DIAGNOSIS — Z86010 Personal history of colonic polyps: Secondary | ICD-10-CM

## 2016-02-01 DIAGNOSIS — E785 Hyperlipidemia, unspecified: Secondary | ICD-10-CM | POA: Diagnosis not present

## 2016-02-01 DIAGNOSIS — Z6841 Body Mass Index (BMI) 40.0 and over, adult: Secondary | ICD-10-CM | POA: Insufficient documentation

## 2016-02-01 DIAGNOSIS — Z794 Long term (current) use of insulin: Secondary | ICD-10-CM | POA: Insufficient documentation

## 2016-02-01 DIAGNOSIS — I252 Old myocardial infarction: Secondary | ICD-10-CM | POA: Diagnosis not present

## 2016-02-01 DIAGNOSIS — I2510 Atherosclerotic heart disease of native coronary artery without angina pectoris: Secondary | ICD-10-CM | POA: Diagnosis not present

## 2016-02-01 DIAGNOSIS — Z538 Procedure and treatment not carried out for other reasons: Secondary | ICD-10-CM | POA: Insufficient documentation

## 2016-02-01 DIAGNOSIS — D509 Iron deficiency anemia, unspecified: Secondary | ICD-10-CM | POA: Diagnosis present

## 2016-02-01 DIAGNOSIS — R1310 Dysphagia, unspecified: Secondary | ICD-10-CM

## 2016-02-01 DIAGNOSIS — K625 Hemorrhage of anus and rectum: Secondary | ICD-10-CM

## 2016-02-01 LAB — GLUCOSE, CAPILLARY
GLUCOSE-CAPILLARY: 78 mg/dL (ref 65–99)
GLUCOSE-CAPILLARY: 84 mg/dL (ref 65–99)
Glucose-Capillary: 42 mg/dL — CL (ref 65–99)
Glucose-Capillary: 47 mg/dL — ABNORMAL LOW (ref 65–99)
Glucose-Capillary: 91 mg/dL (ref 65–99)

## 2016-02-01 SURGERY — CANCELLED PROCEDURE
Anesthesia: Monitor Anesthesia Care

## 2016-02-01 MED ORDER — LABETALOL HCL 5 MG/ML IV SOLN
INTRAVENOUS | Status: AC
  Filled 2016-02-01: qty 4

## 2016-02-01 MED ORDER — DEXTROSE 50 % IV SOLN
25.0000 mL | Freq: Once | INTRAVENOUS | Status: AC
  Administered 2016-02-01: 25 mL via INTRAVENOUS

## 2016-02-01 MED ORDER — LABETALOL HCL 5 MG/ML IV SOLN
5.0000 mg | Freq: Once | INTRAVENOUS | Status: AC
  Administered 2016-02-01: 5 mg via INTRAVENOUS

## 2016-02-01 MED ORDER — DEXTROSE 50 % IV SOLN
INTRAVENOUS | Status: AC
  Filled 2016-02-01: qty 50

## 2016-02-01 MED ORDER — SODIUM CHLORIDE 0.9 % IV SOLN: INTRAVENOUS | Status: DC

## 2016-02-01 MED ORDER — LACTATED RINGERS IV SOLN
INTRAVENOUS | Status: DC
  Administered 2016-02-01: 1000 mL via INTRAVENOUS

## 2016-02-01 NOTE — Telephone Encounter (Signed)
Took one bottle of Suprep out of a pack and left it up front for patient to pick up.  This is to be communicated to patient when she calls next as she has no working phone

## 2016-02-01 NOTE — H&P (Addendum)
[facility] History and Physical   Primary Care Physician:  Philis Fendt, MD   Reason for Procedure:  Iron Deficiency anemia, surveillance of colon adenoma, colonoscopy 2011, GERD   Plan:    EGD and colonoscopy with possible intervention   HPI: Chelsea Lester is a 60 y.o. female history of chronic GERD, colon adenomas, last colonoscopy in 2011 and iron deficiency anemia here for evaluation with EGD and colonoscopy. Denies any nausea, vomiting, abdominal pain, melena or bright red blood per rectum The risks and benefits as well as alternatives of endoscopic procedure(s) have been discussed and reviewed. All questions answered. The patient agrees to proceed.    Past Medical History  Diagnosis Date  . GERD (gastroesophageal reflux disease)   . Colon polyp   . Asthma   . CAD (coronary artery disease)   . Hypertension   . Microcytosis   . Sleep disorder   . Obesity   . Depression 11/07    Hospitalization required  . Cataract   . Diverticulosis   . Esophageal stricture   . Hemorrhoids   . IBS (irritable bowel syndrome)   . Complication of anesthesia     "couldn't get me woke when they put m3e to sleep for my teeth"  . Hyperlipidemia   . Myocardial infarction ([place])     "slight one years ago"  . Pneumonia "several times"  . Chronic bronchitis ([place])     "get it q year"  . OSA on CPAP     "haven't wore it lately; they are suppose to bwe ordering me one that I can use thru my nose" (08/26/2015)  . Thyroid disease     "? high or low" (08/26/2015)  . Type II diabetes mellitus ([place])   . Sickle-cell trait ([place])   . History of hiatal hernia   . Headache     "weekly" (08/26/2015)  . Chronic lower back pain   . Gout   . Osteoarthritis     "all over"  . Anxiety   . Abnormal findings-skull or head     "/Dr. 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 Lupus [facility])     Past Surgical History  Procedure Laterality Date  .  Upper gastrointestinal endoscopy    . Colonoscopy    . Abdominal hysterectomy    . Tonsillectomy    . Coronary angioplasty with stent placement      "I''ve had alot"  . Multiple tooth extractions  ?1980's    "& cut into my jaw"  . Eye surgery Bilateral     "had surgery on the sides of my eyes to keep them popping out"  . Esophagogastroduodenoscopy (egd) with esophageal dilation  "all the time"  . Dilation and curettage of uterus    . Tubal ligation    . Head surgery      metal plate placed    Prior to Admission medications   Medication Sig Start Date End Date Taking? Authorizing Provider  albuterol (PROVENTIL,VENTOLIN) 90 MCG/ACT inhaler Inhale 2 puffs into the lungs every 6 (six) hours as needed for shortness of breath. Wheezing or shortness of breath    Historical Provider, MD  amLODipine (NORVASC) 5 MG tablet Take 1 tablet (5 mg total) by mouth daily. 08/28/15   Charlynne Cousins, MD  aspirin 81 MG tablet Take 81 mg by mouth daily.      Historical Provider, MD  diclofenac sodium (VOLTAREN) 1 % GEL Apply  2 g topically 4 (four) times daily.    Historical Provider, MD  dicyclomine (BENTYL) 10 MG capsule Take 1 tab 3 times a day before meals. 08/28/15   Charlynne Cousins, MD  ergocalciferol (VITAMIN D2) 50000 UNITS capsule Take 1 capsule (50,000 Units total) by mouth once a week. Usually takes on Saturday or fridays 08/28/15   Charlynne Cousins, MD  famotidine (PEPCID) 40 MG tablet Take 40 mg by mouth daily.    Historical Provider, MD  fenofibrate (TRICOR) 145 MG tablet Take 1 tablet (145 mg total) by mouth daily. 08/28/15   Charlynne Cousins, MD  ferrous sulfate 325 (65 FE) MG tablet Take 1 tablet (325 mg total) by mouth daily with breakfast. 08/28/15   Charlynne Cousins, MD  fluticasone [facility]) 50 MCG/ACT nasal spray Place 1 spray into both nostrils 2 (two) times daily. 08/28/15   Charlynne Cousins, MD  fluticasone-salmeterol (ADVAIR HFA) [phone] MCG/ACT inhaler Inhale 2  puffs into the lungs 2 (two) times daily. 08/28/15   Charlynne Cousins, MD  HYDROcodone-acetaminophen (VICODIN) 5-500 MG tablet Take 1 tablet by mouth every 6 (six) hours as needed. 08/28/15   Charlynne Cousins, MD  hyoscyamine (LEVBID) 0.375 MG 12 hr tablet Take 0.375 mg by mouth every 12 (twelve) hours as needed for cramping.    Historical Provider, MD  insulin NPH-regular Human (NOVOLIN 70/30) (70-30) 100 UNIT/ML injection Inject 40 Units into the skin daily with supper. 08/28/15   Charlynne Cousins, MD  insulin NPH-regular Human (NOVOLIN 70/30) (70-30) 100 UNIT/ML injection Inject 60 Units into the skin daily with breakfast. 08/28/15   Charlynne Cousins, MD  Insulin Syringes, Disposable, U-100 1 ML MISC by Does not apply route.      Historical Provider, MD  ipratropium (ATROVENT) 0.02 % nebulizer solution Take 2.5 mLs (0.5 mg total) by nebulization 3 (three) times daily as needed for wheezing or shortness of breath. 08/28/15   Charlynne Cousins, MD  Lancets MISC by Does not apply route.      Historical Provider, MD  loratadine (CLARITIN) 10 MG tablet Take 10 mg by mouth daily.     Historical Provider, MD  metFORMIN (GLUCOPHAGE) 500 MG tablet Take 2 tablets (1,000 mg total) by mouth 2 (two) times daily with a meal. 08/28/15   Charlynne Cousins, MD  methocarbamol (ROBAXIN) 500 MG tablet Take 1 tablet (500 mg total) by mouth 2 (two) times daily. 03/22/14   Antonietta Breach, PA-C  metoprolol (LOPRESSOR) 50 MG tablet Take 1 tablet (50 mg total) by mouth 2 (two) times daily. 08/28/15   Charlynne Cousins, MD  NON FORMULARY [employer] test strip( Glucose Blood)     Historical Provider, MD  ondansetron (ZOFRAN ODT) 4 MG disintegrating tablet 4mg  ODT q4 hours prn nausea/vomit 01/26/16   Deno Etienne, DO  pantoprazole (PROTONIX) 40 MG tablet Take 1 tablet (40 mg total) by mouth daily. 30 mins prior to breakfast. 12/21/15   Lori P Hvozdovic, PA-C  potassium chloride (K-DUR) 10 MEQ tablet Take 1 tablet (10  mEq total) by mouth 3 (three) times daily. 08/28/15   Charlynne Cousins, MD  promethazine (PHENERGAN) 25 MG tablet Take 0.5 tablets (12.5 mg total) by mouth every 6 (six) hours as needed for nausea or vomiting. Patient taking differently: Take 25 mg by mouth every 6 (six) hours as needed for nausea or vomiting.  03/11/14   Dixie Dials, MD  simvastatin (ZOCOR) 40 MG tablet Take 0.5 tablets (20 mg  total) by mouth at bedtime. 08/28/15   Charlynne Cousins, MD  tobramycin (TOBREX) 0.3 % ophthalmic ointment Place 1 application into both eyes 4 (four) times daily.     Historical Provider, MD  travoprost, benzalkonium, (TRAVATAN) 0.004 % ophthalmic solution Place 1 drop into both eyes at bedtime. 08/28/15   Charlynne Cousins, MD    Current Facility-Administered Medications  Medication Dose Route Frequency Provider Last Rate Last Dose  . 0.9 %  sodium chloride infusion   Intravenous Continuous Lori P Hvozdovic, PA-C      . lactated ringers infusion   Intravenous Continuous Mauri Pole, MD        Allergies as of 12/21/2015 - Review Complete 12/21/2015  Allergen Reaction Noted  . Latex Rash 05/02/2012  . Percocet [oxycodone-acetaminophen] Itching 05/22/2011    Family History  Problem Relation Age of Onset  . Rectal cancer Sister   . Lung cancer Father   . Esophageal cancer Father   . Breast cancer Sister 25  . Breast cancer Mother 2  . Colon cancer Other     Aunt, Uncle  . Liver cancer Brother     Nephew  . Diabetes Sister   . Diabetes Brother   . Diabetes Other     son & daughter  . Heart disease Sister   . Heart disease Brother   . Kidney disease Sister   . Breast cancer Maternal Aunt     patient reports 11 mat aunts with breast cancer  . Breast cancer Paternal Aunt 85  . Breast cancer Maternal Grandmother     unk age    Social History   Social History  . Marital Status: Married    Spouse Name: N/A  . Number of Children: N/A  . Years of Education: N/A    Occupational History  . Disabled    Social History Main Topics  . Smoking status: Never Smoker   . Smokeless tobacco: Never Used  . Alcohol Use: No  . Drug Use: No  . Sexual Activity: Not on file   Other Topics Concern  . Not on file   Social History Narrative   Patient gets refular exercise walks.    Review of Systems: All other review of systems negative except as mentioned in the HPI.  Physical Exam: Vital signs in last 24 hours:     General:   Alert,  Well-developed, well-nourished, pleasant and cooperative in NAD Lungs:  Clear throughout to auscultation.   Heart:  Regular rate and rhythm; no murmurs, clicks, rubs,  or gallops. Abdomen:  Soft, nontender and nondistended. Normal bowel sounds.   Neuro/Psych:  Alert and cooperative. Normal mood and affect. A and O x 3   @K .Denzil Magnuson, MD [facility] [phone] (pager) 02/01/2016 10:43 AM@   Patient blood pressure remained elevated at >200/110 despite IV Labetalol 5mg  x 2. Case was cancelled by anesthesia. Patient will need better BP control prior to rescheduling EGD and colonoscopy. Encouraged patient to make appt with PMD soon and will contact PMD to inform as well.

## 2016-02-01 NOTE — Progress Notes (Signed)
Late entry: Pt procedure cancelled due to BP of 209/115 after 10 mg of labetalol. GI and anesthesia MD notified and agreed for pt to follow up with her primary care physician. Prior to discharge, CBG was 47. Pt had two sprites and graham crackers with peanut butter. Fifteen minutes later, CBG was 91.

## 2016-02-01 NOTE — Anesthesia Preprocedure Evaluation (Addendum)
Anesthesia Evaluation  Patient identified by MRN, date of birth, ID band Patient awake    Reviewed: Allergy & Precautions, NPO status , Patient's Chart, lab work & pertinent test results  History of Anesthesia Complications (+) history of anesthetic complications ("hard to wake up")  Airway Mallampati: II  TM Distance: >3 FB Neck ROM: Full    Dental no notable dental hx. (+) Dental Advisory Given   Pulmonary asthma , sleep apnea and Continuous Positive Airway Pressure Ventilation ,    Pulmonary exam normal breath sounds clear to auscultation       Cardiovascular hypertension, Pt. on medications + CAD and + Past MI  Normal cardiovascular exam Rhythm:Regular Rate:Normal     Neuro/Psych  Headaches, PSYCHIATRIC DISORDERS Anxiety Depression    GI/Hepatic Neg liver ROS, GERD  Medicated and Controlled,  Endo/Other  diabetesMorbid obesity  Renal/GU negative Renal ROS  negative genitourinary   Musculoskeletal  (+) Arthritis ,   Abdominal   Peds negative pediatric ROS (+)  Hematology negative hematology ROS (+)   Anesthesia Other Findings   Reproductive/Obstetrics negative OB ROS                          Anesthesia Physical Anesthesia Plan  ASA: III  Anesthesia Plan: MAC   Post-op Pain Management:    Induction: Intravenous  Airway Management Planned: Nasal Cannula  Additional Equipment:   Intra-op Plan:   Post-operative Plan:   Informed Consent: I have reviewed the patients History and Physical, chart, labs and discussed the procedure including the risks, benefits and alternatives for the proposed anesthesia with the patient or authorized representative who has indicated his/her understanding and acceptance.   Dental advisory given  Plan Discussed with: CRNA  Anesthesia Plan Comments: (Was just in the ED last week with abdominal pain and diarrhea which has resolved. She reports that  during that visit she also had some chest pain when she coughed and has had some congestion but feels this is getting better. She denies any chest pain, tightness, SOB today. Will obtain an EKG to compare since none was done in the ED. Also she took her morning insulin dose despite being instructed not to and her blood sugar was in the 40s upon arrival. D50 given. BP still elevated and giving labetalol. She has completed a prep but given her upper airway congestion and OSA will opt to perform the colonoscopy only today and come back later for the EGD. If her blood pressure remains elevated, will cancel the procedure. )       Anesthesia Quick Evaluation

## 2016-02-01 NOTE — Progress Notes (Signed)
Late entry: Pt arrived to endo feeling dizzy and shaky. Pt took 40 units of her insulin this morning. CBG was 42 and Dr. Jillyn Hidden notified. 1/2 amp of D50  given and CBG rechecked 45min later. CBG was 78. Again, Dr. Jillyn Hidden notified and second 1/2 amp of D50 given per her request.

## 2016-02-16 ENCOUNTER — Other Ambulatory Visit: Payer: Self-pay

## 2016-02-16 DIAGNOSIS — Z1231 Encounter for screening mammogram for malignant neoplasm of breast: Secondary | ICD-10-CM

## 2016-04-10 ENCOUNTER — Ambulatory Visit (INDEPENDENT_AMBULATORY_CARE_PROVIDER_SITE_OTHER): Payer: Medicaid Other | Admitting: Sports Medicine

## 2016-04-10 ENCOUNTER — Encounter: Payer: Self-pay | Admitting: Sports Medicine

## 2016-04-10 DIAGNOSIS — E1142 Type 2 diabetes mellitus with diabetic polyneuropathy: Secondary | ICD-10-CM | POA: Diagnosis not present

## 2016-04-10 DIAGNOSIS — B351 Tinea unguium: Secondary | ICD-10-CM | POA: Diagnosis not present

## 2016-04-10 DIAGNOSIS — M79676 Pain in unspecified toe(s): Secondary | ICD-10-CM | POA: Diagnosis not present

## 2016-04-10 DIAGNOSIS — M79609 Pain in unspecified limb: Principal | ICD-10-CM

## 2016-04-10 MED ORDER — GABAPENTIN 300 MG PO CAPS: 300.0000 mg | ORAL_CAPSULE | Freq: Every day | ORAL | Status: DC

## 2016-04-10 NOTE — Patient Instructions (Signed)
Diabetes and Foot Care Diabetes may cause you to have problems because of poor blood supply (circulation) to your feet and legs. This may cause the skin on your feet to become thinner, break easier, and heal more slowly. Your skin may become dry, and the skin may peel and crack. You may also have nerve damage in your legs and feet causing decreased feeling in them. You may not notice minor injuries to your feet that could lead to infections or more serious problems. Taking care of your feet is one of the most important things you can do for yourself.  HOME CARE INSTRUCTIONS  Wear shoes at all times, even in the house. Do not go barefoot. Bare feet are easily injured.  Check your feet daily for blisters, cuts, and redness. If you cannot see the bottom of your feet, use a mirror or ask someone for help.  Wash your feet with warm water (do not use hot water) and mild soap. Then pat your feet and the areas between your toes until they are completely dry. Do not soak your feet as this can dry your skin.  Apply a moisturizing lotion or petroleum jelly (that does not contain alcohol and is unscented) to the skin on your feet and to dry, brittle toenails. Do not apply lotion between your toes.  Trim your toenails straight across. Do not dig under them or around the cuticle. File the edges of your nails with an emery board or nail file.  Do not cut corns or calluses or try to remove them with medicine.  Wear clean socks or stockings every day. Make sure they are not too tight. Do not wear knee-high stockings since they may decrease blood flow to your legs.  Wear shoes that fit properly and have enough cushioning. To break in new shoes, wear them for just a few hours a day. This prevents you from injuring your feet. Always look in your shoes before you put them on to be sure there are no objects inside.  Do not cross your legs. This may decrease the blood flow to your feet.  If you find a minor scrape,  cut, or break in the skin on your feet, keep it and the skin around it clean and dry. These areas may be cleansed with mild soap and water. Do not cleanse the area with peroxide, alcohol, or iodine.  When you remove an adhesive bandage, be sure not to damage the skin around it.  If you have a wound, look at it several times a day to make sure it is healing.  Do not use heating pads or hot water bottles. They may burn your skin. If you have lost feeling in your feet or legs, you may not know it is happening until it is too late.  Make sure your health care provider performs a complete foot exam at least annually or more often if you have foot problems. Report any cuts, sores, or bruises to your health care provider immediately. SEEK MEDICAL CARE IF:   You have an injury that is not healing.  You have cuts or breaks in the skin.  You have an ingrown nail.  You notice redness on your legs or feet.  You feel burning or tingling in your legs or feet.  You have pain or cramps in your legs and feet.  Your legs or feet are numb.  Your feet always feel cold. SEEK IMMEDIATE MEDICAL CARE IF:   There is increasing redness,   swelling, or pain in or around a wound.  There is a red line that goes up your leg.  Pus is coming from a wound.  You develop a fever or as directed by your health care provider.  You notice a bad smell coming from an ulcer or wound.   This information is not intended to replace advice given to you by your health care provider. Make sure you discuss any questions you have with your health care provider.   Document Released: 10/19/2000 Document Revised: 06/24/2013 Document Reviewed: 03/31/2013 [employer] Interactive Patient Education 2016 [employer]  

## 2016-04-10 NOTE — Progress Notes (Signed)
Patient ID: Chelsea Lester, female   DOB: [birth year], 60 y.o.   MRN: [id number] Subjective: Chelsea Lester is a 60 y.o. female patient with history of diabetes who presents to office today complaining of long, painful nails  while ambulating in shoes; unable to trim. Patient states that the glucose reading this morning was 100 mg/dl. Diagnosed 15 years ago. Patient denies any new changes in medication or new problems. Patient denies any new cramping, numbness, burning or tingling in the legs. Admits to worsening numbness over the last 3 months. Patient has no other pedal complaints.  Patient Active Problem List   Diagnosis Date Noted  . Acute encephalopathy   . Hyperglycemia 08/26/2015  . Chronic high back pain 08/26/2015  . AKI (acute kidney injury) ([facility]) 08/26/2015  . Prolonged Q-T interval on ECG 08/26/2015  . OSA (obstructive sleep apnea) 08/26/2015  . Chest pain at rest 03/09/2014  . Family history of breast cancer 03/23/2013  . Family history of GI tract cancer 03/23/2013  . Dyspnea 05/02/2012  . Pulmonary infiltrates 05/02/2012  . Cough 05/02/2012  . Family history of malignant neoplasm of gastrointestinal tract 07/25/2011  . Abdominal pain, epigastric 07/25/2011  . UNSPECIFIED IRON DEFICIENCY ANEMIA 03/23/2009  . Arthritis, degenerative 01/05/2009  . HEMORRHOIDS, INTERNAL 10/05/2008  . RHINITIS 01/14/2008  . HYPOTENSION 11/14/2007  . CELLULITIS AND ABSCESS OF TRUNK 11/14/2007  . HYPOKALEMIA 10/28/2007  . UTI 10/28/2007  . DIZZINESS 10/28/2007  . Irritable bowel syndrome 08/13/2007  . EXOPHTHALMOS NOS 05/26/2007  . BILIRUBINURIA 05/26/2007  . Bulging eyes 05/26/2007  . DRUG ABUSE 04/14/2007  . SINUSITIS, MAXILLARY, ACUTE 03/24/2007  . ANXIETY DISORDER, GENERALIZED 03/04/2007  . CALF PAIN 03/04/2007  . BENIGN NEOPLASM [place] DIGESTIVE SYSTEM 01/17/2007  . ESOPHAGEAL STRICTURE 01/17/2007  . HYPERLIPIDEMIA 01/14/2007  . INSOMNIA 12/03/2006  . POLYP, COLON  09/12/2006  . Uncontrolled diabetes mellitus ([facility]) 09/12/2006  . OBESITY 09/12/2006  . Hypertension 09/12/2006  . Asthma 09/12/2006  . GERD 09/12/2006  . OSTEOARTHRITIS 09/12/2006  . MICROCYTOSIS 09/12/2006  . HYSTERECTOMY, HX OF 09/12/2006  . Essential (primary) hypertension 09/12/2006  . Type 2 diabetes mellitus ([facility]) 09/12/2006   Current Outpatient Prescriptions on File Prior to Visit  Medication Sig Dispense Refill  . albuterol (PROVENTIL,VENTOLIN) 90 MCG/ACT inhaler Inhale 2 puffs into the lungs every 6 (six) hours as needed for shortness of breath. Wheezing or shortness of breath    . amLODipine (NORVASC) 5 MG tablet Take 1 tablet (5 mg total) by mouth daily. 30 tablet 0  . aspirin 81 MG tablet Take 81 mg by mouth daily.      . diclofenac sodium (VOLTAREN) 1 % GEL Apply 2 g topically 4 (four) times daily.    Marland Kitchen dicyclomine (BENTYL) 10 MG capsule Take 1 tab 3 times a day before meals. 90 capsule 1  . ergocalciferol (VITAMIN D2) 50000 UNITS capsule Take 1 capsule (50,000 Units total) by mouth once a week. Usually takes on Saturday or fridays 30 capsule 0  . famotidine (PEPCID) 40 MG tablet Take 40 mg by mouth daily.    . fenofibrate (TRICOR) 145 MG tablet Take 1 tablet (145 mg total) by mouth daily. 30 tablet 0  . ferrous sulfate 325 (65 FE) MG tablet Take 1 tablet (325 mg total) by mouth daily with breakfast. 90 tablet 3  . fluticasone (FLONASE) 50 MCG/ACT nasal spray Place 1 spray into both nostrils 2 (two) times daily. 1 g 2  . fluticasone-salmeterol (ADVAIR HFA) 115-21 MCG/ACT inhaler  Inhale 2 puffs into the lungs 2 (two) times daily. 1 Inhaler 12  . HYDROcodone-acetaminophen (VICODIN) 5-500 MG tablet Take 1 tablet by mouth every 6 (six) hours as needed. 10 tablet 0  . hyoscyamine (LEVBID) 0.375 MG 12 hr tablet Take 0.375 mg by mouth every 12 (twelve) hours as needed for cramping.    . insulin NPH-regular Human (NOVOLIN 70/30) (70-30) 100 UNIT/ML injection Inject 40 Units into the  skin daily with supper. 10 mL 11  . insulin NPH-regular Human (NOVOLIN 70/30) (70-30) 100 UNIT/ML injection Inject 60 Units into the skin daily with breakfast. 10 mL 11  . Insulin Syringes, Disposable, U-100 1 ML MISC by Does not apply route.      Marland Kitchen ipratropium (ATROVENT) 0.02 % nebulizer solution Take 2.5 mLs (0.5 mg total) by nebulization 3 (three) times daily as needed for wheezing or shortness of breath. 75 mL 12  . Lancets MISC by Does not apply route.      . loratadine (CLARITIN) 10 MG tablet Take 10 mg by mouth daily.     . metFORMIN (GLUCOPHAGE) 500 MG tablet Take 2 tablets (1,000 mg total) by mouth 2 (two) times daily with a meal. 60 tablet 3  . methocarbamol (ROBAXIN) 500 MG tablet Take 1 tablet (500 mg total) by mouth 2 (two) times daily. 20 tablet 0  . metoprolol (LOPRESSOR) 50 MG tablet Take 1 tablet (50 mg total) by mouth 2 (two) times daily. 30 tablet 0  . NON FORMULARY [place] test strip( Glucose Blood)     . ondansetron (ZOFRAN ODT) 4 MG disintegrating tablet 48m ODT q4 hours prn nausea/vomit 20 tablet 0  . pantoprazole (PROTONIX) 40 MG tablet Take 1 tablet (40 mg total) by mouth daily. 30 mins prior to breakfast. 30 tablet 6  . potassium chloride (K-DUR) 10 MEQ tablet Take 1 tablet (10 mEq total) by mouth 3 (three) times daily. 45 tablet 0  . promethazine (PHENERGAN) 25 MG tablet Take 0.5 tablets (12.5 mg total) by mouth every 6 (six) hours as needed for nausea or vomiting. (Patient taking differently: Take 25 mg by mouth every 6 (six) hours as needed for nausea or vomiting. ) 30 tablet 0  . simvastatin (ZOCOR) 40 MG tablet Take 0.5 tablets (20 mg total) by mouth at bedtime. 30 tablet 0  . tobramycin (TOBREX) 0.3 % ophthalmic ointment Place 1 application into both eyes 4 (four) times daily.     . travoprost, benzalkonium, (TRAVATAN) 0.004 % ophthalmic solution Place 1 drop into both eyes at bedtime. 2.5 mL 12   No current facility-administered medications on file prior to visit.    Allergies  Allergen Reactions  . Ace Inhibitors     Other reaction(s): OTHER  . Latex Rash    unknown  . Percocet [Oxycodone-Acetaminophen] Itching         Recent Results (from the past 2160 hour(s))  Lipase, blood     Status: None   Collection Time: 01/26/16  3:19 PM  Result Value Ref Range   Lipase 40 11 - 51 U/L  Comprehensive metabolic panel     Status: Abnormal   Collection Time: 01/26/16  3:19 PM  Result Value Ref Range   Sodium 145 135 - 145 mmol/L   Potassium 3.8 3.5 - 5.1 mmol/L   Chloride 109 101 - 111 mmol/L   CO2 27 22 - 32 mmol/L   Glucose, Bld 71 65 - 99 mg/dL   BUN 11 6 - 20 mg/dL   Creatinine, Ser  0.79 0.44 - 1.00 mg/dL   Calcium 8.9 8.9 - 10.3 mg/dL   Total Protein 7.4 6.5 - 8.1 g/dL   Albumin 3.9 3.5 - 5.0 g/dL   AST 14 (L) 15 - 41 U/L   ALT 21 14 - 54 U/L   Alkaline Phosphatase 54 38 - 126 U/L   Total Bilirubin 0.6 0.3 - 1.2 mg/dL   GFR calc non Af Amer >60 >60 mL/min   GFR calc Af Amer >60 >60 mL/min    Comment: (NOTE) The eGFR has been calculated using the CKD EPI equation. This calculation has not been validated in all clinical situations. eGFR's persistently <60 mL/min signify possible Chronic Kidney Disease.    Anion gap 9 5 - 15  CBC     Status: Abnormal   Collection Time: 01/26/16  3:19 PM  Result Value Ref Range   WBC 5.7 4.0 - 10.5 K/uL   RBC 4.62 3.87 - 5.11 MIL/uL   Hemoglobin 9.5 (L) 12.0 - 15.0 g/dL   HCT 30.0 (L) 36.0 - 46.0 %   MCV 64.9 (L) 78.0 - 100.0 fL   MCH 20.6 (L) 26.0 - 34.0 pg   MCHC 31.7 30.0 - 36.0 g/dL   RDW 20.2 (H) 11.5 - 15.5 %   Platelets 275 150 - 400 K/uL  Urinalysis, Routine w reflex microscopic (not at [facility])     Status: Abnormal   Collection Time: 01/26/16  5:15 PM  Result Value Ref Range   Color, Urine YELLOW YELLOW   APPearance CLEAR CLEAR   Specific Gravity, Urine 1.013 1.005 - 1.030   pH 6.5 5.0 - 8.0   Glucose, UA NEGATIVE NEGATIVE mg/dL   Hgb urine dipstick NEGATIVE NEGATIVE   Bilirubin Urine  NEGATIVE NEGATIVE   Ketones, ur NEGATIVE NEGATIVE mg/dL   Protein, ur 30 (A) NEGATIVE mg/dL   Nitrite NEGATIVE NEGATIVE   Leukocytes, UA NEGATIVE NEGATIVE  Urine microscopic-add on     Status: Abnormal   Collection Time: 01/26/16  5:15 PM  Result Value Ref Range   Squamous Epithelial / LPF 0-5 (A) NONE SEEN   WBC, UA 0-5 0 - 5 WBC/hpf   RBC / HPF 0-5 0 - 5 RBC/hpf   Bacteria, UA RARE (A) NONE SEEN  Glucose, capillary     Status: Abnormal   Collection Time: 02/01/16 10:49 AM  Result Value Ref Range   Glucose-Capillary 42 (LL) 65 - 99 mg/dL  Glucose, capillary     Status: None   Collection Time: 02/01/16 11:28 AM  Result Value Ref Range   Glucose-Capillary 78 65 - 99 mg/dL  Glucose, capillary     Status: None   Collection Time: 02/01/16 11:53 AM  Result Value Ref Range   Glucose-Capillary 84 65 - 99 mg/dL   Comment 1 Notify RN   Glucose, capillary     Status: Abnormal   Collection Time: 02/01/16  1:01 PM  Result Value Ref Range   Glucose-Capillary 47 (L) 65 - 99 mg/dL  Glucose, capillary     Status: None   Collection Time: 02/01/16  1:24 PM  Result Value Ref Range   Glucose-Capillary 91 65 - 99 mg/dL    Objective: General: Patient is awake, alert, and oriented x 3 and in no acute distress.  Integument: Skin is warm, dry and supple bilateral. Nails are tender, long, thickened and  dystrophic with subungual debris, consistent with onychomycosis, 1-5 bilateral. No signs of infection. No open lesions or preulcerative lesions present bilateral. Remaining integument  unremarkable.  Vasculature:  Dorsalis Pedis pulse 1/4 bilateral. Posterior Tibial pulse  1/4 bilateral.  Capillary fill time <3 sec 1-5 bilateral. Scant hair growth to the level of the digits. Temperature gradient within normal limits. No varicosities present bilateral. No edema present bilateral.   Neurology: The patient has intact sensation measured with a 5.07/10g Semmes Weinstein Monofilament at all pedal sites  bilateral . Vibratory sensation diminished bilateral with tuning fork. No Babinski sign present bilateral. Subjective numbness bilateral.  Musculoskeletal: No symptomatic pedal deformities noted bilateral. Muscular strength 5/5 in all lower extremity muscular groups bilateral without pain on range of motion . No tenderness with calf compression bilateral.  Assessment and Plan: Problem List Items Addressed This Visit    None    Visit Diagnoses    Pain due to onychomycosis of nail    -  Primary    Diabetic polyneuropathy associated with type 2 diabetes mellitus (HCC)        Relevant Medications    gabapentin (NEURONTIN) 300 MG capsule      -Examined patient. -Discussed and educated patient on diabetic foot care, especially with  regards to the vascular, neurological and musculoskeletal systems.  -Stressed the importance of good glycemic control and the detriment of not  controlling glucose levels in relation to the foot. -Mechanically debrided all nails 1-5 bilateral using sterile nail nipper and filed with dremel without incident  -Rx Gabapentin 363m qhs  -Answered all patient questions -Patient to return  in 3 months for at risk foot care -Patient advised to call the office if any problems or questions arise in the meantime.  TLandis Martins DPM

## 2016-06-23 ENCOUNTER — Encounter (HOSPITAL_COMMUNITY): Payer: Self-pay | Admitting: Emergency Medicine

## 2016-06-23 ENCOUNTER — Emergency Department (HOSPITAL_COMMUNITY): Payer: Medicaid Other

## 2016-06-23 ENCOUNTER — Emergency Department (HOSPITAL_COMMUNITY)
Admission: EM | Admit: 2016-06-23 | Discharge: 2016-06-23 | Disposition: A | Discharge status: Home / Self Care | Payer: Medicaid Other | Attending: Emergency Medicine | Admitting: Emergency Medicine

## 2016-06-23 DIAGNOSIS — I252 Old myocardial infarction: Secondary | ICD-10-CM | POA: Diagnosis not present

## 2016-06-23 DIAGNOSIS — I2510 Atherosclerotic heart disease of native coronary artery without angina pectoris: Secondary | ICD-10-CM | POA: Insufficient documentation

## 2016-06-23 DIAGNOSIS — J45909 Unspecified asthma, uncomplicated: Secondary | ICD-10-CM | POA: Insufficient documentation

## 2016-06-23 DIAGNOSIS — Z7982 Long term (current) use of aspirin: Secondary | ICD-10-CM | POA: Insufficient documentation

## 2016-06-23 DIAGNOSIS — I10 Essential (primary) hypertension: Secondary | ICD-10-CM | POA: Insufficient documentation

## 2016-06-23 DIAGNOSIS — J189 Pneumonia, unspecified organism: Secondary | ICD-10-CM | POA: Insufficient documentation

## 2016-06-23 DIAGNOSIS — Z794 Long term (current) use of insulin: Secondary | ICD-10-CM | POA: Diagnosis not present

## 2016-06-23 DIAGNOSIS — Z79899 Other long term (current) drug therapy: Secondary | ICD-10-CM | POA: Diagnosis not present

## 2016-06-23 DIAGNOSIS — R0602 Shortness of breath: Secondary | ICD-10-CM | POA: Diagnosis present

## 2016-06-23 DIAGNOSIS — Z955 Presence of coronary angioplasty implant and graft: Secondary | ICD-10-CM | POA: Insufficient documentation

## 2016-06-23 DIAGNOSIS — E119 Type 2 diabetes mellitus without complications: Secondary | ICD-10-CM | POA: Insufficient documentation

## 2016-06-23 DIAGNOSIS — Z7984 Long term (current) use of oral hypoglycemic drugs: Secondary | ICD-10-CM | POA: Diagnosis not present

## 2016-06-23 LAB — URINALYSIS, ROUTINE W REFLEX MICROSCOPIC
Bilirubin Urine: NEGATIVE
GLUCOSE, UA: NEGATIVE mg/dL
Hgb urine dipstick: NEGATIVE
Ketones, ur: NEGATIVE mg/dL
Nitrite: NEGATIVE
PH: 5.5 (ref 5.0–8.0)
Protein, ur: 30 mg/dL — AB
SPECIFIC GRAVITY, URINE: 1.018 (ref 1.005–1.030)

## 2016-06-23 LAB — CBC WITH DIFFERENTIAL/PLATELET
BASOS PCT: 0 %
Basophils Absolute: 0 10*3/uL (ref 0.0–0.1)
Eosinophils Absolute: 0.1 10*3/uL (ref 0.0–0.7)
Eosinophils Relative: 2 %
HEMATOCRIT: 32.1 % — AB (ref 36.0–46.0)
HEMOGLOBIN: 9.7 g/dL — AB (ref 12.0–15.0)
LYMPHS ABS: 3 10*3/uL (ref 0.7–4.0)
LYMPHS PCT: 42 %
MCH: 20.6 pg — AB (ref 26.0–34.0)
MCHC: 30.2 g/dL (ref 30.0–36.0)
MCV: 68 fL — ABNORMAL LOW (ref 78.0–100.0)
MONOS PCT: 5 %
Monocytes Absolute: 0.4 10*3/uL (ref 0.1–1.0)
NEUTROS ABS: 3.6 10*3/uL (ref 1.7–7.7)
Neutrophils Relative %: 51 %
Platelets: 228 10*3/uL (ref 150–400)
RBC: 4.72 MIL/uL (ref 3.87–5.11)
RDW: 19.9 % — AB (ref 11.5–15.5)
WBC: 7.1 10*3/uL (ref 4.0–10.5)

## 2016-06-23 LAB — COMPREHENSIVE METABOLIC PANEL
ALBUMIN: 3.6 g/dL (ref 3.5–5.0)
ALK PHOS: 58 U/L (ref 38–126)
ALT: 15 U/L (ref 14–54)
ANION GAP: 5 (ref 5–15)
AST: 11 U/L — ABNORMAL LOW (ref 15–41)
BUN: 13 mg/dL (ref 6–20)
CALCIUM: 8.7 mg/dL — AB (ref 8.9–10.3)
CO2: 27 mmol/L (ref 22–32)
Chloride: 106 mmol/L (ref 101–111)
Creatinine, Ser: 0.89 mg/dL (ref 0.44–1.00)
GFR calc Af Amer: >60 mL/min (ref >60)
GFR calc non Af Amer: >60 mL/min (ref >60)
GLUCOSE: 158 mg/dL — AB (ref 65–99)
Potassium: 3.5 mmol/L (ref 3.5–5.1)
SODIUM: 138 mmol/L (ref 135–145)
Total Bilirubin: 0.4 mg/dL (ref 0.3–1.2)
Total Protein: 6.6 g/dL (ref 6.5–8.1)

## 2016-06-23 LAB — URINE MICROSCOPIC-ADD ON: RBC / HPF: NONE SEEN RBC/hpf (ref 0–5)

## 2016-06-23 LAB — BRAIN NATRIURETIC PEPTIDE: B Natriuretic Peptide: 17.5 pg/mL (ref 0.0–100.0)

## 2016-06-23 LAB — TROPONIN I: Troponin I: <0.03 ng/mL (ref <0.03)

## 2016-06-23 MED ORDER — HYDROCODONE-ACETAMINOPHEN 5-325 MG PO TABS
1.0000 | ORAL_TABLET | Freq: Once | ORAL | Status: AC
  Administered 2016-06-23: 1 via ORAL
  Filled 2016-06-23: qty 1

## 2016-06-23 MED ORDER — CEFDINIR 300 MG PO CAPS
300.0000 mg | ORAL_CAPSULE | Freq: Two times a day (BID) | ORAL | 0 refills | Status: DC
Start: 2016-06-23 — End: 2016-09-18

## 2016-06-23 NOTE — ED Triage Notes (Signed)
Per EMS:  Pt experiencing increased issues with her asthma/emphysema x 2 weeks.  Tonight, pt began to excperience increased SOB x 1 hr with no relief from her inhaler.  Pt on 2L [place] [facility] O2 when EMS arrived.  Saturations 100%.  EMS sts lungs sound clear.   Pt also c/o chest and back pain x 2 weeks.

## 2016-06-23 NOTE — ED Provider Notes (Signed)
[facility] DEPT Provider Note   CSN: [id number] Arrival date & time: 06/23/16  [id number]  By signing my name below, I, Higinio Plan, attest that this documentation has been prepared under the direction and in the presence of Isla Pence, MD . Electronically Signed: Higinio Plan, Scribe. 06/23/2016. 1:09 AM.  History   Chief Complaint Chief Complaint  Patient presents with  . Shortness of Breath   The history is provided by the patient. No language interpreter was used.   HPI Comments: Chelsea Lester is a 60 y.o. female who presents to the Emergency Department complaining of gradually worsening, shortness of breath that began 1 hour PTA. Pt reports increased issues from her asthma and emphysema within the past 2 weeks; she notes she used her inhaler tonight with no relief. She states she uses 2L [place] [employer] at home. Pt reports associated fever, chest pain, numbness on her right side and mild leg swelling. She denies abdominal pain and hx of CHF. Pt states she has experienced similar symptoms before in which she visited her PCP and was diagnosed with bronchitis and prescribed penicillin with no relief.   Past Medical History:  Diagnosis Date  . Abnormal findings-skull or head    "/Dr. 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 Anxiety   . Asthma   . CAD (coronary artery disease)   . Cataract   . Chronic bronchitis ([place])    "get it q year"  . Chronic lower back pain   . Colon polyp   . Complication of anesthesia    "couldn't get me woke when they put m3e to sleep for my teeth"  . Depression 11/07   Hospitalization required  . Diverticulosis   . Esophageal stricture   . GERD (gastroesophageal reflux disease)   . Gout   . Headache    "weekly" (08/26/2015)  . Hemorrhoids   . History of hiatal hernia   . Hyperlipidemia   . Hypertension   . IBS (irritable bowel syndrome)   . Lupus ([place])   . Microcytosis   . Myocardial infarction ([place])    "slight one years ago"  . Obesity   . OSA on CPAP    "haven't wore it lately; they are suppose to [facility]we ordering me one that I can use thru my nose" (08/26/2015)  . Osteoarthritis    "all over"  . Pneumonia "several times"  . Sickle-cell trait ([place])   . Sleep disorder   . Thyroid disease    "? high or low" (08/26/2015)  . Type II diabetes mellitus [facility])     Patient Active Problem List   Diagnosis Date Noted  . Acute encephalopathy   . Hyperglycemia 08/26/2015  . Chronic high back pain 08/26/2015  . AKI (acute kidney injury) ([place]) 08/26/2015  . Prolonged Q-T interval on ECG 08/26/2015  . OSA (obstructive sleep apnea) 08/26/2015  . Chest pain at rest 03/09/2014  . Family history of breast cancer 03/23/2013  . Family history of GI tract cancer 03/23/2013  . Dyspnea 05/02/2012  . Pulmonary infiltrates 05/02/2012  . Cough 05/02/2012  . Family history of malignant neoplasm of gastrointestinal tract 07/25/2011  . Abdominal pain, epigastric 07/25/2011  . UNSPECIFIED IRON DEFICIENCY ANEMIA 03/23/2009  . Arthritis, degenerative 01/05/2009  . HEMORRHOIDS, INTERNAL 10/05/2008  . RHINITIS 01/14/2008  . HYPOTENSION 11/14/2007  . CELLULITIS AND ABSCESS OF TRUNK 11/14/2007  . HYPOKALEMIA 10/28/2007  . UTI 10/28/2007  . DIZZINESS 10/28/2007  . Irritable  bowel syndrome 08/13/2007  . EXOPHTHALMOS NOS 05/26/2007  . BILIRUBINURIA 05/26/2007  . Bulging eyes 05/26/2007  . DRUG ABUSE 04/14/2007  . SINUSITIS, MAXILLARY, ACUTE 03/24/2007  . ANXIETY DISORDER, GENERALIZED 03/04/2007  . CALF PAIN 03/04/2007  . BENIGN NEOPLASM [place] DIGESTIVE SYSTEM 01/17/2007  . ESOPHAGEAL STRICTURE 01/17/2007  . HYPERLIPIDEMIA 01/14/2007  . INSOMNIA 12/03/2006  . POLYP, COLON 09/12/2006  . Uncontrolled diabetes mellitus ([place]) 09/12/2006  . OBESITY 09/12/2006  . Hypertension 09/12/2006  . Asthma 09/12/2006  . GERD 09/12/2006  . OSTEOARTHRITIS 09/12/2006  . MICROCYTOSIS 09/12/2006  .  HYSTERECTOMY, HX OF 09/12/2006  . Essential (primary) hypertension 09/12/2006  . Type 2 diabetes mellitus ([place]) 09/12/2006    Past Surgical History:  Procedure Laterality Date  . ABDOMINAL HYSTERECTOMY    . COLONOSCOPY    . CORONARY ANGIOPLASTY WITH STENT PLACEMENT     "I''ve had alot"  . DILATION AND CURETTAGE OF UTERUS    . ESOPHAGOGASTRODUODENOSCOPY (EGD) WITH ESOPHAGEAL DILATION  "all the time"  . EYE SURGERY Bilateral    "had surgery on the sides of my eyes to keep them popping out"  . head surgery     metal plate placed  . MULTIPLE TOOTH EXTRACTIONS  ?1980's   "& cut into my jaw"  . TONSILLECTOMY    . TUBAL LIGATION    . UPPER GASTROINTESTINAL ENDOSCOPY      OB History    No data available     Home Medications    Prior to Admission medications   Medication Sig Start Date End Date Taking? Authorizing Provider  albuterol (PROVENTIL,VENTOLIN) 90 MCG/ACT inhaler Inhale 2 puffs into the lungs every 6 (six) hours as needed for shortness of breath. Wheezing or shortness of breath    Historical Provider, MD  amLODipine (NORVASC) 5 MG tablet Take 1 tablet (5 mg total) by mouth daily. 08/28/15   Charlynne Cousins, MD  aspirin 81 MG tablet Take 81 mg by mouth daily.      Historical Provider, MD  cefdinir (OMNICEF) 300 MG capsule Take 1 capsule (300 mg total) by mouth 2 (two) times daily. 06/23/16   Isla Pence, MD  diclofenac sodium (VOLTAREN) 1 % GEL Apply 2 g topically 4 (four) times daily.    Historical Provider, MD  dicyclomine (BENTYL) 10 MG capsule Take 1 tab 3 times a day before meals. 08/28/15   Charlynne Cousins, MD  ergocalciferol (VITAMIN D2) 50000 UNITS capsule Take 1 capsule (50,000 Units total) by mouth once a week. Usually takes on Saturday or fridays 08/28/15   Charlynne Cousins, MD  famotidine (PEPCID) 40 MG tablet Take 40 mg by mouth daily.    Historical Provider, MD  fenofibrate (TRICOR) 145 MG tablet Take 1 tablet (145 mg total) by mouth daily. 08/28/15    Charlynne Cousins, MD  ferrous sulfate 325 (65 FE) MG tablet Take 1 tablet (325 mg total) by mouth daily with breakfast. 08/28/15   Charlynne Cousins, MD  fluticasone [facility]) 50 MCG/ACT nasal spray Place 1 spray into both nostrils 2 (two) times daily. 08/28/15   Charlynne Cousins, MD  fluticasone-salmeterol (ADVAIR HFA) [phone] MCG/ACT inhaler Inhale 2 puffs into the lungs 2 (two) times daily. 08/28/15   Charlynne Cousins, MD  gabapentin (NEURONTIN) 300 MG capsule Take 1 capsule (300 mg total) by mouth at bedtime. 04/10/16   Landis Martins, DPM  HYDROcodone-acetaminophen (VICODIN) 5-500 MG tablet Take 1 tablet by mouth every 6 (six) hours as needed. 08/28/15  Charlynne Cousins, MD  hyoscyamine (LEVBID) 0.375 MG 12 hr tablet Take 0.375 mg by mouth every 12 (twelve) hours as needed for cramping.    Historical Provider, MD  insulin NPH-regular Human (NOVOLIN 70/30) (70-30) 100 UNIT/ML injection Inject 40 Units into the skin daily with supper. 08/28/15   Charlynne Cousins, MD  insulin NPH-regular Human (NOVOLIN 70/30) (70-30) 100 UNIT/ML injection Inject 60 Units into the skin daily with breakfast. 08/28/15   Charlynne Cousins, MD  Insulin Syringes, Disposable, U-100 1 ML MISC by Does not apply route.      Historical Provider, MD  ipratropium (ATROVENT) 0.02 % nebulizer solution Take 2.5 mLs (0.5 mg total) by nebulization 3 (three) times daily as needed for wheezing or shortness of breath. 08/28/15   Charlynne Cousins, MD  Lancets MISC by Does not apply route.      Historical Provider, MD  loratadine (CLARITIN) 10 MG tablet Take 10 mg by mouth daily.     Historical Provider, MD  metFORMIN (GLUCOPHAGE) 500 MG tablet Take 2 tablets (1,000 mg total) by mouth 2 (two) times daily with a meal. 08/28/15   Charlynne Cousins, MD  methocarbamol (ROBAXIN) 500 MG tablet Take 1 tablet (500 mg total) by mouth 2 (two) times daily. 03/22/14   Antonietta Breach, PA-C  metoprolol (LOPRESSOR) 50 MG tablet Take 1  tablet (50 mg total) by mouth 2 (two) times daily. 08/28/15   Charlynne Cousins, MD  NON FORMULARY [employer] test strip( Glucose Blood)     Historical Provider, MD  ondansetron (ZOFRAN ODT) 4 MG disintegrating tablet 4mg  ODT q4 hours prn nausea/vomit 01/26/16   Deno Etienne, DO  pantoprazole (PROTONIX) 40 MG tablet Take 1 tablet (40 mg total) by mouth daily. 30 mins prior to breakfast. 12/21/15   Lori P Hvozdovic, PA-C  potassium chloride (K-DUR) 10 MEQ tablet Take 1 tablet (10 mEq total) by mouth 3 (three) times daily. 08/28/15   Charlynne Cousins, MD  promethazine (PHENERGAN) 25 MG tablet Take 0.5 tablets (12.5 mg total) by mouth every 6 (six) hours as needed for nausea or vomiting. Patient taking differently: Take 25 mg by mouth every 6 (six) hours as needed for nausea or vomiting.  03/11/14   Dixie Dials, MD  simvastatin (ZOCOR) 40 MG tablet Take 0.5 tablets (20 mg total) by mouth at bedtime. 08/28/15   Charlynne Cousins, MD  tobramycin (TOBREX) 0.3 % ophthalmic ointment Place 1 application into both eyes 4 (four) times daily.     Historical Provider, MD  travoprost, benzalkonium, (TRAVATAN) 0.004 % ophthalmic solution Place 1 drop into both eyes at bedtime. 08/28/15   Charlynne Cousins, MD    Family History Family History  Problem Relation Age of Onset  . Rectal cancer Sister   . Lung cancer Father   . Esophageal cancer Father   . Breast cancer Sister 80  . Breast cancer Mother 88  . Colon cancer Other     Aunt, Uncle  . Liver cancer Brother     Nephew  . Diabetes Sister   . Diabetes Brother   . Diabetes Other     son & daughter  . Heart disease Sister   . Heart disease Brother   . Kidney disease Sister   . Breast cancer Maternal Aunt     patient reports 11 mat aunts with breast cancer  . Breast cancer Paternal Aunt 85  . Breast cancer Maternal Grandmother     unk age  Social History Social History  Substance Use Topics  . Smoking status: Never Smoker  . Smokeless  tobacco: Never Used  . Alcohol use No     Allergies   Ace inhibitors; Latex; and Percocet [oxycodone-acetaminophen]   Review of Systems Review of Systems  Constitutional: Positive for fever.  Respiratory: Positive for shortness of breath.   Cardiovascular: Positive for chest pain and leg swelling.  Gastrointestinal: Negative for abdominal pain.  Neurological: Positive for numbness.   Physical Exam Updated Vital Signs BP (!) 153/134 (BP Location: Right Arm)   Pulse 77   Temp 98.7 F (37.1 C) (Oral)   Resp 18   Ht 5\' 7"  (1.702 m)   Wt 295 lb (133.8 kg)   SpO2 100%   BMI 46.20 kg/m   Physical Exam  Constitutional: She is oriented to person, place, and time. She appears well-developed and well-nourished.  HENT:  Head: Normocephalic and atraumatic.  Eyes: Conjunctivae are normal. Pupils are equal, round, and reactive to light. Right eye exhibits no discharge. Left eye exhibits no discharge. No scleral icterus.  Neck: Normal range of motion. No JVD present. No tracheal deviation present.  Pulmonary/Chest: No stridor. She has rales.  Musculoskeletal:  1+ pitting edema on lower extremities  Neurological: She is alert and oriented to person, place, and time. Coordination normal.  Psychiatric: She has a normal mood and affect. Her behavior is normal. Judgment and thought content normal.  Nursing note and vitals reviewed.  ED Treatments / Results  Labs (all labs ordered are listed, but only abnormal results are displayed) Labs Reviewed  COMPREHENSIVE METABOLIC PANEL - Abnormal; Notable for the following:       Result Value   Glucose, Bld 158 (*)    Calcium 8.7 (*)    AST 11 (*)    All other components within normal limits  CBC WITH DIFFERENTIAL/PLATELET - Abnormal; Notable for the following:    Hemoglobin 9.7 (*)    HCT 32.1 (*)    MCV 68.0 (*)    MCH 20.6 (*)    RDW 19.9 (*)    All other components within normal limits  URINALYSIS, ROUTINE W REFLEX MICROSCOPIC (NOT  AT [facility]) - Abnormal; Notable for the following:    Protein, ur 30 (*)    Leukocytes, UA SMALL (*)    All other components within normal limits  URINE MICROSCOPIC-ADD ON - Abnormal; Notable for the following:    Squamous Epithelial / LPF 0-5 (*)    Bacteria, UA RARE (*)    All other components within normal limits  TROPONIN I  BRAIN NATRIURETIC PEPTIDE    EKG  EKG Interpretation  Date/Time:  Saturday June 23 2016 01:08:38 EDT Ventricular Rate:  78 PR Interval:    QRS Duration: 93 QT Interval:  421 QTC Calculation: 480 R Axis:   10 Text Interpretation:  Sinus rhythm Confirmed by Gilford Raid MD, Jeren Dufrane ([id number]) on 06/23/2016 2:23:28 AM       Radiology Dg Chest 2 View  Result Date: 06/23/2016 CLINICAL DATA:  Acute onset of shortness of breath. Initial encounter. EXAM: CHEST  2 VIEW COMPARISON:  Chest radiograph from 08/27/2015 FINDINGS: The lungs are hypoexpanded. Mild vascular crowding is noted. Mild bibasilar opacities likely reflect atelectasis, though mild pneumonia cannot be entirely excluded. There is no evidence of pleural effusion or pneumothorax. The heart is mildly enlarged. No acute osseous abnormalities are seen. IMPRESSION: Lungs hypoexpanded. Mild bibasilar opacities likely reflect atelectasis, though mild pneumonia cannot be entirely excluded. Mild cardiomegaly.  Electronically Signed   By: Garald Balding M.D.   On: 06/23/2016 02:12    Procedures Procedures  DIAGNOSTIC STUDIES:  Oxygen Saturation is 100% on RA, normal by my interpretation.    COORDINATION OF CARE:  1:00 AM Discussed treatment plan with pt at bedside and pt agreed to plan.  Medications Ordered in ED Medications - No data to display  Initial Impression / Assessment and Plan / ED Course  I have reviewed the triage vital signs and the nursing notes.  Pertinent labs & imaging results that were available during my care of the patient were reviewed by me and considered in my medical decision making  (see chart for details).  Clinical Course   Pt is feeling much better.  I will change her abx from amox to omnicef.  She can't take zithromax or levaquin b/c of long QT.  She knows to return if worse.   I personally performed the services described in this documentation, which was scribed in my presence. The recorded information has been reviewed and is accurate.   Final Clinical Impressions(s) / ED Diagnoses   Final diagnoses:  CAP (community acquired pneumonia)    New Prescriptions New Prescriptions   CEFDINIR (OMNICEF) 300 MG CAPSULE    Take 1 capsule (300 mg total) by mouth 2 (two) times daily.     Isla Pence, MD 06/23/16 [phone]

## 2016-06-23 NOTE — ED Notes (Signed)
Pt transported to xray 

## 2016-07-23 ENCOUNTER — Ambulatory Visit: Payer: Medicaid Other | Admitting: Sports Medicine

## 2016-07-24 ENCOUNTER — Encounter: Payer: Self-pay | Admitting: Sports Medicine

## 2016-07-24 ENCOUNTER — Ambulatory Visit (INDEPENDENT_AMBULATORY_CARE_PROVIDER_SITE_OTHER): Payer: Medicaid Other | Admitting: Sports Medicine

## 2016-07-24 DIAGNOSIS — E1142 Type 2 diabetes mellitus with diabetic polyneuropathy: Secondary | ICD-10-CM | POA: Diagnosis not present

## 2016-07-24 DIAGNOSIS — B351 Tinea unguium: Secondary | ICD-10-CM | POA: Diagnosis not present

## 2016-07-24 DIAGNOSIS — M79676 Pain in unspecified toe(s): Secondary | ICD-10-CM | POA: Diagnosis not present

## 2016-07-24 DIAGNOSIS — M79609 Pain in unspecified limb: Principal | ICD-10-CM

## 2016-07-24 NOTE — Progress Notes (Signed)
Patient ID: Chelsea Lester, female   DOB: 18-Aug-1956, 60 y.o.   MRN: [id number] Subjective: Chelsea Lester is a 60 y.o. female patient with history of diabetes who returns to office today complaining of long, painful nails  while ambulating in shoes; unable to trim. Patient states that the glucose reading this morning was "good". Patient denies any new changes in medication or new problems. Patient denies any new cramping, numbness, burning or tingling in the legs. Patient has no other pedal complaints.  Patient Active Problem List   Diagnosis Date Noted  . Acute encephalopathy   . Hyperglycemia 08/26/2015  . Chronic high back pain 08/26/2015  . AKI (acute kidney injury) ([place]) 08/26/2015  . Prolonged Q-T interval on ECG 08/26/2015  . OSA (obstructive sleep apnea) 08/26/2015  . Chest pain at rest 03/09/2014  . Family history of breast cancer 03/23/2013  . Family history of GI tract cancer 03/23/2013  . Dyspnea 05/02/2012  . Pulmonary infiltrates 05/02/2012  . Cough 05/02/2012  . Family history of malignant neoplasm of gastrointestinal tract 07/25/2011  . Abdominal pain, epigastric 07/25/2011  . UNSPECIFIED IRON DEFICIENCY ANEMIA 03/23/2009  . Arthritis, degenerative 01/05/2009  . HEMORRHOIDS, INTERNAL 10/05/2008  . RHINITIS 01/14/2008  . HYPOTENSION 11/14/2007  . CELLULITIS AND ABSCESS OF TRUNK 11/14/2007  . HYPOKALEMIA 10/28/2007  . UTI 10/28/2007  . DIZZINESS 10/28/2007  . Irritable bowel syndrome 08/13/2007  . EXOPHTHALMOS NOS 05/26/2007  . BILIRUBINURIA 05/26/2007  . Bulging eyes 05/26/2007  . DRUG ABUSE 04/14/2007  . SINUSITIS, MAXILLARY, ACUTE 03/24/2007  . ANXIETY DISORDER, GENERALIZED 03/04/2007  . CALF PAIN 03/04/2007  . BENIGN NEOPLASM [place] DIGESTIVE SYSTEM 01/17/2007  . ESOPHAGEAL STRICTURE 01/17/2007  . HYPERLIPIDEMIA 01/14/2007  . INSOMNIA 12/03/2006  . POLYP, COLON 09/12/2006  . Uncontrolled diabetes mellitus ([place]) 09/12/2006  . OBESITY 09/12/2006   . Hypertension 09/12/2006  . Asthma 09/12/2006  . GERD 09/12/2006  . OSTEOARTHRITIS 09/12/2006  . MICROCYTOSIS 09/12/2006  . HYSTERECTOMY, HX OF 09/12/2006  . Essential (primary) hypertension 09/12/2006  . Type 2 diabetes mellitus ([place]) 09/12/2006   Current Outpatient Prescriptions on File Prior to Visit  Medication Sig Dispense Refill  . albuterol (PROVENTIL,VENTOLIN) 90 MCG/ACT inhaler Inhale 2 puffs into the lungs every 6 (six) hours as needed for shortness of breath. Wheezing or shortness of breath    . amLODipine (NORVASC) 5 MG tablet Take 1 tablet (5 mg total) by mouth daily. 30 tablet 0  . aspirin 81 MG tablet Take 81 mg by mouth daily.      . cefdinir (OMNICEF) 300 MG capsule Take 1 capsule (300 mg total) by mouth 2 (two) times daily. 14 capsule 0  . diclofenac sodium (VOLTAREN) 1 % GEL Apply 2 g topically 4 (four) times daily.    Marland Kitchen dicyclomine (BENTYL) 10 MG capsule Take 1 tab 3 times a day before meals. 90 capsule 1  . ergocalciferol (VITAMIN D2) 50000 UNITS capsule Take 1 capsule (50,000 Units total) by mouth once a week. Usually takes on Saturday or fridays 30 capsule 0  . famotidine (PEPCID) 40 MG tablet Take 40 mg by mouth daily.    . fenofibrate (TRICOR) 145 MG tablet Take 1 tablet (145 mg total) by mouth daily. 30 tablet 0  . ferrous sulfate 325 (65 FE) MG tablet Take 1 tablet (325 mg total) by mouth daily with breakfast. 90 tablet 3  . fluticasone (FLONASE) 50 MCG/ACT nasal spray Place 1 spray into both nostrils 2 (two) times daily. 1 g 2  .  fluticasone-salmeterol (ADVAIR HFA) 115-21 MCG/ACT inhaler Inhale 2 puffs into the lungs 2 (two) times daily. 1 Inhaler 12  . gabapentin (NEURONTIN) 300 MG capsule Take 1 capsule (300 mg total) by mouth at bedtime. 30 capsule 2  . HYDROcodone-acetaminophen (VICODIN) 5-500 MG tablet Take 1 tablet by mouth every 6 (six) hours as needed. 10 tablet 0  . hyoscyamine (LEVBID) 0.375 MG 12 hr tablet Take 0.375 mg by mouth every 12 (twelve)  hours as needed for cramping.    . insulin NPH-regular Human (NOVOLIN 70/30) (70-30) 100 UNIT/ML injection Inject 40 Units into the skin daily with supper. 10 mL 11  . insulin NPH-regular Human (NOVOLIN 70/30) (70-30) 100 UNIT/ML injection Inject 60 Units into the skin daily with breakfast. 10 mL 11  . Insulin Syringes, Disposable, U-100 1 ML MISC by Does not apply route.      Marland Kitchen ipratropium (ATROVENT) 0.02 % nebulizer solution Take 2.5 mLs (0.5 mg total) by nebulization 3 (three) times daily as needed for wheezing or shortness of breath. 75 mL 12  . Lancets MISC by Does not apply route.      . loratadine (CLARITIN) 10 MG tablet Take 10 mg by mouth daily.     . metFORMIN (GLUCOPHAGE) 500 MG tablet Take 2 tablets (1,000 mg total) by mouth 2 (two) times daily with a meal. 60 tablet 3  . methocarbamol (ROBAXIN) 500 MG tablet Take 1 tablet (500 mg total) by mouth 2 (two) times daily. 20 tablet 0  . metoprolol (LOPRESSOR) 50 MG tablet Take 1 tablet (50 mg total) by mouth 2 (two) times daily. 30 tablet 0  . NON FORMULARY [employer] test strip( Glucose Blood)     . ondansetron (ZOFRAN ODT) 4 MG disintegrating tablet 60m ODT q4 hours prn nausea/vomit 20 tablet 0  . pantoprazole (PROTONIX) 40 MG tablet Take 1 tablet (40 mg total) by mouth daily. 30 mins prior to breakfast. 30 tablet 6  . potassium chloride (K-DUR) 10 MEQ tablet Take 1 tablet (10 mEq total) by mouth 3 (three) times daily. 45 tablet 0  . promethazine (PHENERGAN) 25 MG tablet Take 0.5 tablets (12.5 mg total) by mouth every 6 (six) hours as needed for nausea or vomiting. (Patient taking differently: Take 25 mg by mouth every 6 (six) hours as needed for nausea or vomiting. ) 30 tablet 0  . simvastatin (ZOCOR) 40 MG tablet Take 0.5 tablets (20 mg total) by mouth at bedtime. 30 tablet 0  . tobramycin (TOBREX) 0.3 % ophthalmic ointment Place 1 application into both eyes 4 (four) times daily.     . travoprost, benzalkonium, (TRAVATAN) 0.004 %  ophthalmic solution Place 1 drop into both eyes at bedtime. 2.5 mL 12   No current facility-administered medications on file prior to visit.    Allergies  Allergen Reactions  . Ace Inhibitors     Other reaction(s): OTHER  . Latex Rash    unknown  . Percocet [Oxycodone-Acetaminophen] Itching         Recent Results (from the past 2160 hour(s))  Comprehensive metabolic panel     Status: Abnormal   Collection Time: 06/23/16  1:33 AM  Result Value Ref Range   Sodium 138 135 - 145 mmol/L   Potassium 3.5 3.5 - 5.1 mmol/L   Chloride 106 101 - 111 mmol/L   CO2 27 22 - 32 mmol/L   Glucose, Bld 158 (H) 65 - 99 mg/dL   BUN 13 6 - 20 mg/dL   Creatinine, Ser 0.89 0.44 -  1.00 mg/dL   Calcium 8.7 (L) 8.9 - 10.3 mg/dL   Total Protein 6.6 6.5 - 8.1 g/dL   Albumin 3.6 3.5 - 5.0 g/dL   AST 11 (L) 15 - 41 U/L   ALT 15 14 - 54 U/L   Alkaline Phosphatase 58 38 - 126 U/L   Total Bilirubin 0.4 0.3 - 1.2 mg/dL   GFR calc non Af Amer >60 >60 mL/min   GFR calc Af Amer >60 >60 mL/min    Comment: (NOTE) The eGFR has been calculated using the CKD EPI equation. This calculation has not been validated in all clinical situations. eGFR's persistently <60 mL/min signify possible Chronic Kidney Disease.    Anion gap 5 5 - 15  CBC WITH DIFFERENTIAL     Status: Abnormal   Collection Time: 06/23/16  1:33 AM  Result Value Ref Range   WBC 7.1 4.0 - 10.5 K/uL   RBC 4.72 3.87 - 5.11 MIL/uL   Hemoglobin 9.7 (L) 12.0 - 15.0 g/dL   HCT 32.1 (L) 36.0 - 46.0 %   MCV 68.0 (L) 78.0 - 100.0 fL   MCH 20.6 (L) 26.0 - 34.0 pg   MCHC 30.2 30.0 - 36.0 g/dL   RDW 19.9 (H) 11.5 - 15.5 %   Platelets 228 150 - 400 K/uL   Neutrophils Relative % 51 %   Lymphocytes Relative 42 %   Monocytes Relative 5 %   Eosinophils Relative 2 %   Basophils Relative 0 %   Neutro Abs 3.6 1.7 - 7.7 K/uL   Lymphs Abs 3.0 0.7 - 4.0 K/uL   Monocytes Absolute 0.4 0.1 - 1.0 K/uL   Eosinophils Absolute 0.1 0.0 - 0.7 K/uL   Basophils Absolute  0.0 0.0 - 0.1 K/uL   RBC Morphology POLYCHROMASIA PRESENT   Troponin I     Status: None   Collection Time: 06/23/16  1:33 AM  Result Value Ref Range   Troponin I <0.03 <0.03 ng/mL  Brain natriuretic peptide     Status: None   Collection Time: 06/23/16  1:33 AM  Result Value Ref Range   B Natriuretic Peptide 17.5 0.0 - 100.0 pg/mL  Urinalysis, Routine w reflex microscopic (not at [facility])     Status: Abnormal   Collection Time: 06/23/16  2:17 AM  Result Value Ref Range   Color, Urine YELLOW YELLOW   APPearance CLEAR CLEAR   Specific Gravity, Urine 1.018 1.005 - 1.030   pH 5.5 5.0 - 8.0   Glucose, UA NEGATIVE NEGATIVE mg/dL   Hgb urine dipstick NEGATIVE NEGATIVE   Bilirubin Urine NEGATIVE NEGATIVE   Ketones, ur NEGATIVE NEGATIVE mg/dL   Protein, ur 30 (A) NEGATIVE mg/dL   Nitrite NEGATIVE NEGATIVE   Leukocytes, UA SMALL (A) NEGATIVE  Urine microscopic-add on     Status: Abnormal   Collection Time: 06/23/16  2:17 AM  Result Value Ref Range   Squamous Epithelial / LPF 0-5 (A) NONE SEEN   WBC, UA 0-5 0 - 5 WBC/hpf   RBC / HPF NONE SEEN 0 - 5 RBC/hpf   Bacteria, UA RARE (A) NONE SEEN    Objective: General: Patient is awake, alert, and oriented x 3 and in no acute distress.  Integument: Skin is warm, dry and supple bilateral. Nails are tender, long, thickened and  dystrophic with subungual debris, consistent with onychomycosis, 1-5 bilateral. No signs of infection. No open lesions or preulcerative lesions present bilateral. Remaining integument unremarkable.  Vasculature:  Dorsalis Pedis pulse 1/4 bilateral.  Posterior Tibial pulse  1/4 bilateral.  Capillary fill time <3 sec 1-5 bilateral. Scant hair growth to the level of the digits. Temperature gradient within normal limits. No varicosities present bilateral. No edema present bilateral.   Neurology: The patient has intact sensation measured with a 5.07/10g Semmes Weinstein Monofilament at all pedal sites bilateral . Vibratory  sensation diminished bilateral with tuning fork. No Babinski sign present bilateral. Subjective numbness bilateral.  Musculoskeletal: No symptomatic pedal deformities noted bilateral. Muscular strength 5/5 in all lower extremity muscular groups bilateral without pain on range of motion . No tenderness with calf compression bilateral.  Assessment and Plan: Problem List Items Addressed This Visit    None    Visit Diagnoses    Pain due to onychomycosis of nail    -  Primary   Diabetic polyneuropathy associated with type 2 diabetes mellitus ([place])         -Examined patient. -Discussed and educated patient on diabetic foot care, especially with  regards to the vascular, neurological and musculoskeletal systems.  -Stressed the importance of good glycemic control and the detriment of not controlling glucose levels in relation to the foot. -Mechanically debrided all nails 1-5 bilateral using sterile nail nipper and filed with dremel without incident  -Continued with Gabapentin -Answered all patient questions -Patient to return  in 3 months for at risk foot care -Patient advised to call the office if any problems or questions arise in the meantime.  Landis Martins, DPM

## 2016-09-13 ENCOUNTER — Encounter (HOSPITAL_COMMUNITY): Payer: Self-pay

## 2016-09-13 ENCOUNTER — Emergency Department (HOSPITAL_COMMUNITY)
Admission: EM | Admit: 2016-09-13 | Discharge: 2016-09-14 | Disposition: A | Discharge status: Home / Self Care | Payer: Medicaid Other | Attending: Emergency Medicine | Admitting: Emergency Medicine

## 2016-09-13 DIAGNOSIS — K5900 Constipation, unspecified: Secondary | ICD-10-CM | POA: Diagnosis not present

## 2016-09-13 DIAGNOSIS — Z955 Presence of coronary angioplasty implant and graft: Secondary | ICD-10-CM | POA: Insufficient documentation

## 2016-09-13 DIAGNOSIS — Z794 Long term (current) use of insulin: Secondary | ICD-10-CM | POA: Insufficient documentation

## 2016-09-13 DIAGNOSIS — Z7982 Long term (current) use of aspirin: Secondary | ICD-10-CM | POA: Insufficient documentation

## 2016-09-13 DIAGNOSIS — E119 Type 2 diabetes mellitus without complications: Secondary | ICD-10-CM | POA: Insufficient documentation

## 2016-09-13 DIAGNOSIS — M791 Myalgia, unspecified site: Secondary | ICD-10-CM

## 2016-09-13 DIAGNOSIS — I252 Old myocardial infarction: Secondary | ICD-10-CM | POA: Insufficient documentation

## 2016-09-13 DIAGNOSIS — Z91040 Latex allergy status: Secondary | ICD-10-CM | POA: Diagnosis not present

## 2016-09-13 DIAGNOSIS — J45909 Unspecified asthma, uncomplicated: Secondary | ICD-10-CM | POA: Diagnosis not present

## 2016-09-13 DIAGNOSIS — L02211 Cutaneous abscess of abdominal wall: Secondary | ICD-10-CM | POA: Insufficient documentation

## 2016-09-13 DIAGNOSIS — M79605 Pain in left leg: Secondary | ICD-10-CM | POA: Diagnosis not present

## 2016-09-13 DIAGNOSIS — M79604 Pain in right leg: Secondary | ICD-10-CM | POA: Insufficient documentation

## 2016-09-13 DIAGNOSIS — Z7984 Long term (current) use of oral hypoglycemic drugs: Secondary | ICD-10-CM | POA: Diagnosis not present

## 2016-09-13 DIAGNOSIS — I2510 Atherosclerotic heart disease of native coronary artery without angina pectoris: Secondary | ICD-10-CM | POA: Diagnosis not present

## 2016-09-13 DIAGNOSIS — I10 Essential (primary) hypertension: Secondary | ICD-10-CM | POA: Insufficient documentation

## 2016-09-13 DIAGNOSIS — Z8500 Personal history of malignant neoplasm of unspecified digestive organ: Secondary | ICD-10-CM | POA: Insufficient documentation

## 2016-09-13 DIAGNOSIS — Z79899 Other long term (current) drug therapy: Secondary | ICD-10-CM | POA: Diagnosis not present

## 2016-09-13 DIAGNOSIS — R0789 Other chest pain: Secondary | ICD-10-CM

## 2016-09-13 DIAGNOSIS — R079 Chest pain, unspecified: Secondary | ICD-10-CM | POA: Diagnosis not present

## 2016-09-13 DIAGNOSIS — R0602 Shortness of breath: Secondary | ICD-10-CM | POA: Insufficient documentation

## 2016-09-13 LAB — COMPREHENSIVE METABOLIC PANEL
ALBUMIN: 3.8 g/dL (ref 3.5–5.0)
ALK PHOS: 57 U/L (ref 38–126)
ALT: 12 U/L — ABNORMAL LOW (ref 14–54)
AST: 11 U/L — AB (ref 15–41)
Anion gap: 7 (ref 5–15)
BILIRUBIN TOTAL: 0.4 mg/dL (ref 0.3–1.2)
BUN: 8 mg/dL (ref 6–20)
CALCIUM: 9 mg/dL (ref 8.9–10.3)
CO2: 28 mmol/L (ref 22–32)
Chloride: 106 mmol/L (ref 101–111)
Creatinine, Ser: 0.73 mg/dL (ref 0.44–1.00)
GFR calc Af Amer: >60 mL/min (ref >60)
GLUCOSE: 133 mg/dL — AB (ref 65–99)
POTASSIUM: 3.7 mmol/L (ref 3.5–5.1)
Sodium: 141 mmol/L (ref 135–145)
TOTAL PROTEIN: 7.5 g/dL (ref 6.5–8.1)

## 2016-09-13 LAB — CBC
HCT: 32 % — ABNORMAL LOW (ref 36.0–46.0)
Hemoglobin: 10.1 g/dL — ABNORMAL LOW (ref 12.0–15.0)
MCH: 20.8 pg — ABNORMAL LOW (ref 26.0–34.0)
MCHC: 31.6 g/dL (ref 30.0–36.0)
MCV: 66 fL — AB (ref 78.0–100.0)
PLATELETS: 278 10*3/uL (ref 150–400)
RBC: 4.85 MIL/uL (ref 3.87–5.11)
RDW: 20.4 % — AB (ref 11.5–15.5)
WBC: 7.4 10*3/uL (ref 4.0–10.5)

## 2016-09-13 LAB — I-STAT TROPONIN, ED: Troponin i, poc: 0 ng/mL (ref 0.00–0.08)

## 2016-09-13 LAB — LIPASE, BLOOD: Lipase: 37 U/L (ref 11–51)

## 2016-09-13 NOTE — ED Notes (Signed)
Pt also c/o abd pain with n/v/d and having a low CBG; pt states she "fell out at church"; pt requesting pain medication; will inform MD

## 2016-09-13 NOTE — ED Triage Notes (Addendum)
Pt arrived via EMS and sent to triage. Pt has multiple complaints: Pt reports bilateral leg pain, inability to sleep, diarrhea, and abdominal pain.

## 2016-09-13 NOTE — ED Notes (Signed)
Pt informed of the need of a urine sample. Pt states she cannot give because she hasnt had any water or anything.

## 2016-09-13 NOTE — ED Notes (Signed)
RN called lab to add on BNP

## 2016-09-13 NOTE — ED Provider Notes (Signed)
[facility] DEPT Provider Note   CSN: [id number] Arrival date & time: 09/13/16  [id number]   By signing my name below, I, Evelene Croon, attest that this documentation has been prepared under the direction and in the presence of Julianne Rice, MD . Electronically Signed: Evelene Croon, Scribe. 09/13/2016. 11:30 PM.   History   Chief Complaint Chief Complaint  Patient presents with  . Leg Pain   The history is provided by the patient. No language interpreter was used.    HPI Comments:  Chelsea Lester is a 60 y.o. female with a history of CAD, asthma, and chronic back pain, who presents to the Emergency Department with multiple complaints. She is complaining of BLE pain with associated swelling x 1-2 weeks that worsened today. She reports pain down the entire leg. She is also complaining of bl low back pain, abdominal pain, and central CP for 1-2 weeks as well. She also notes cough and difficulty breathing especially with exertion. Pt notes she was given antibiotics 2 days ago for a boil to right abdomen which she has been complaint with. She notes drainage from the site. She denies recent fall/injury. She also denies chills and fever. No alleviating factors noted.     Past Medical History:  Diagnosis Date  . Abnormal findings-skull or head    "/Dr. 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 Anxiety   . Asthma   . CAD (coronary artery disease)   . Cataract   . Chronic bronchitis ([place])    "get it q year"  . Chronic lower back pain   . Colon polyp   . Complication of anesthesia    "couldn't get me woke when they put m3e to sleep for my teeth"  . Depression 11/07   Hospitalization required  . Diverticulosis   . Esophageal stricture   . GERD (gastroesophageal reflux disease)   . Gout   . Headache    "weekly" (08/26/2015)  . Hemorrhoids   . History of hiatal hernia   . Hyperlipidemia   . Hypertension   . IBS (irritable bowel syndrome)     . Lupus   . Microcytosis   . Myocardial infarction    "slight one years ago"  . Obesity   . OSA on CPAP    "haven't wore it lately; they are suppose to bwe ordering me one that I can use thru my nose" (08/26/2015)  . Osteoarthritis    "all over"  . Pneumonia "several times"  . Sickle-cell trait ([place])   . Sleep disorder   . Thyroid disease    "? high or low" (08/26/2015)  . Type II diabetes mellitus [facility])     Patient Active Problem List   Diagnosis Date Noted  . Acute encephalopathy   . Hyperglycemia 08/26/2015  . Chronic high back pain 08/26/2015  . AKI (acute kidney injury) ([place]) 08/26/2015  . Prolonged Q-T interval on ECG 08/26/2015  . OSA (obstructive sleep apnea) 08/26/2015  . Chest pain at rest 03/09/2014  . Family history of breast cancer 03/23/2013  . Family history of GI tract cancer 03/23/2013  . Dyspnea 05/02/2012  . Pulmonary infiltrates 05/02/2012  . Cough 05/02/2012  . Family history of malignant neoplasm of gastrointestinal tract 07/25/2011  . Abdominal pain, epigastric 07/25/2011  . UNSPECIFIED IRON DEFICIENCY ANEMIA 03/23/2009  . Arthritis, degenerative 01/05/2009  . HEMORRHOIDS, INTERNAL 10/05/2008  . RHINITIS 01/14/2008  . HYPOTENSION 11/14/2007  . CELLULITIS AND ABSCESS OF  TRUNK 11/14/2007  . HYPOKALEMIA 10/28/2007  . UTI 10/28/2007  . DIZZINESS 10/28/2007  . Irritable bowel syndrome 08/13/2007  . EXOPHTHALMOS NOS 05/26/2007  . BILIRUBINURIA 05/26/2007  . Bulging eyes 05/26/2007  . DRUG ABUSE 04/14/2007  . SINUSITIS, MAXILLARY, ACUTE 03/24/2007  . ANXIETY DISORDER, GENERALIZED 03/04/2007  . CALF PAIN 03/04/2007  . BENIGN NEOPLASM [place] DIGESTIVE SYSTEM 01/17/2007  . ESOPHAGEAL STRICTURE 01/17/2007  . HYPERLIPIDEMIA 01/14/2007  . INSOMNIA 12/03/2006  . POLYP, COLON 09/12/2006  . Uncontrolled diabetes mellitus ([place]) 09/12/2006  . OBESITY 09/12/2006  . Hypertension 09/12/2006  . Asthma 09/12/2006  . GERD 09/12/2006  .  OSTEOARTHRITIS 09/12/2006  . MICROCYTOSIS 09/12/2006  . HYSTERECTOMY, HX OF 09/12/2006  . Essential (primary) hypertension 09/12/2006  . Type 2 diabetes mellitus ([place]) 09/12/2006    Past Surgical History:  Procedure Laterality Date  . ABDOMINAL HYSTERECTOMY    . COLONOSCOPY    . CORONARY ANGIOPLASTY WITH STENT PLACEMENT     "I''ve had alot"  . DILATION AND CURETTAGE OF UTERUS    . ESOPHAGOGASTRODUODENOSCOPY (EGD) WITH ESOPHAGEAL DILATION  "all the time"  . EYE SURGERY Bilateral    "had surgery on the sides of my eyes to keep them popping out"  . head surgery     metal plate placed  . MULTIPLE TOOTH EXTRACTIONS  ?1980's   "& cut into my jaw"  . TONSILLECTOMY    . TUBAL LIGATION    . UPPER GASTROINTESTINAL ENDOSCOPY      OB History    No data available       Home Medications    Prior to Admission medications   Medication Sig Start Date End Date Taking? Authorizing Provider  albuterol (PROVENTIL,VENTOLIN) 90 MCG/ACT inhaler Inhale 2 puffs into the lungs every 6 (six) hours as needed for shortness of breath. Wheezing or shortness of breath    Historical Provider, MD  ALPRAZolam Duanne Moron) 0.5 MG tablet Take 1 tablet by mouth at bedtime as needed. 09/11/16   Historical Provider, MD  amLODipine (NORVASC) 10 MG tablet Take 10 mg by mouth daily. 09/11/16   Historical Provider, MD  aspirin 81 MG tablet Take 81 mg by mouth daily.      Historical Provider, MD  cefdinir (OMNICEF) 300 MG capsule Take 1 capsule (300 mg total) by mouth 2 (two) times daily. 06/23/16   Isla Pence, MD  citalopram (CELEXA) 10 MG tablet Take 1 tablet by mouth daily. 08/13/16   Historical Provider, MD  colchicine 0.6 MG tablet Take 1 tablet by mouth daily. 07/16/16   Historical Provider, MD  diclofenac sodium (VOLTAREN) 1 % GEL Apply 2 g topically 4 (four) times daily as needed.     Historical Provider, MD  dicyclomine (BENTYL) 10 MG capsule Take 1 tab 3 times a day before meals. 08/28/15   Charlynne Cousins, MD   docusate sodium (COLACE) 100 MG capsule Take 1 capsule (100 mg total) by mouth 2 (two) times daily as needed for mild constipation. 09/14/16   Julianne Rice, MD  ergocalciferol (VITAMIN D2) 50000 UNITS capsule Take 1 capsule (50,000 Units total) by mouth once a week. Usually takes on Saturday or fridays 08/28/15   Charlynne Cousins, MD  famotidine (PEPCID) 40 MG tablet Take 40 mg by mouth daily.    Historical Provider, MD  fenofibrate (TRICOR) 145 MG tablet Take 1 tablet (145 mg total) by mouth daily. 08/28/15   Charlynne Cousins, MD  ferrous sulfate 325 (65 FE) MG tablet Take 1 tablet (  325 mg total) by mouth daily with breakfast. 08/28/15   Charlynne Cousins, MD  fluticasone [facility]) 50 MCG/ACT nasal spray Place 1 spray into both nostrils 2 (two) times daily. 08/28/15   Charlynne Cousins, MD  fluticasone-salmeterol (ADVAIR HFA) [phone] MCG/ACT inhaler Inhale 2 puffs into the lungs 2 (two) times daily. 08/28/15   Charlynne Cousins, MD  gabapentin (NEURONTIN) 300 MG capsule Take 1 capsule (300 mg total) by mouth at bedtime. 04/10/16   Landis Martins, DPM  HYDROcodone-acetaminophen (VICODIN) 5-500 MG tablet Take 1 tablet by mouth every 6 (six) hours as needed. Patient taking differently: Take 1 tablet by mouth every 6 (six) hours as needed for pain.  08/28/15   Charlynne Cousins, MD  hyoscyamine (LEVBID) 0.375 MG 12 hr tablet Take 0.375 mg by mouth every 12 (twelve) hours as needed for cramping.    Historical Provider, MD  insulin NPH-regular Human (NOVOLIN 70/30) (70-30) 100 UNIT/ML injection Inject 40 Units into the skin daily with supper. 08/28/15   Charlynne Cousins, MD  insulin NPH-regular Human (NOVOLIN 70/30) (70-30) 100 UNIT/ML injection Inject 60 Units into the skin daily with breakfast. 08/28/15   Charlynne Cousins, MD  Insulin Syringes, Disposable, U-100 1 ML MISC by Does not apply route.      Historical Provider, MD  ipratropium (ATROVENT) 0.02 % nebulizer solution Take 2.5  mLs (0.5 mg total) by nebulization 3 (three) times daily as needed for wheezing or shortness of breath. 08/28/15   Charlynne Cousins, MD  Lancets MISC by Does not apply route.      Historical Provider, MD  loratadine (CLARITIN) 10 MG tablet Take 10 mg by mouth daily.     Historical Provider, MD  metFORMIN (GLUCOPHAGE) 500 MG tablet Take 2 tablets (1,000 mg total) by mouth 2 (two) times daily with a meal. Patient taking differently: Take 500 mg by mouth 2 (two) times daily with a meal.  08/28/15   Charlynne Cousins, MD  methocarbamol (ROBAXIN) 500 MG tablet Take 1 tablet (500 mg total) by mouth 2 (two) times daily. 03/22/14   Antonietta Breach, PA-C  metoprolol (LOPRESSOR) 50 MG tablet Take 1 tablet (50 mg total) by mouth 2 (two) times daily. 08/28/15   Charlynne Cousins, MD  nitroGLYCERIN (NITROSTAT) 0.4 MG SL tablet Place 0.4 mg under the tongue every 5 (five) minutes as needed for chest pain.  09/07/16   Historical Provider, MD  NON FORMULARY [place] test strip( Glucose Blood)     Historical Provider, MD  ondansetron (ZOFRAN ODT) 4 MG disintegrating tablet 4mg  ODT q4 hours prn nausea/vomit 01/26/16   Deno Etienne, DO  pantoprazole (PROTONIX) 40 MG tablet Take 1 tablet (40 mg total) by mouth daily. 30 mins prior to breakfast. 12/21/15   Lori P Hvozdovic, PA-C  polyethylene glycol (MIRALAX / GLYCOLAX) packet Take 17 g by mouth daily. 09/14/16   Julianne Rice, MD  potassium chloride (K-DUR) 10 MEQ tablet Take 1 tablet (10 mEq total) by mouth 3 (three) times daily. 08/28/15   Charlynne Cousins, MD  promethazine (PHENERGAN) 25 MG tablet Take 0.5 tablets (12.5 mg total) by mouth every 6 (six) hours as needed for nausea or vomiting. Patient taking differently: Take 25 mg by mouth every 6 (six) hours as needed for nausea or vomiting.  03/11/14   Dixie Dials, MD  RESTASIS 0.05 % ophthalmic emulsion Place 1 drop into both eyes 2 (two) times daily.  09/10/16   Historical Provider, MD  simvastatin ([place])  40 MG  tablet Take 0.5 tablets (20 mg total) by mouth at bedtime. 08/28/15   Charlynne Cousins, MD  tobramycin (TOBREX) 0.3 % ophthalmic ointment Place 1 application into both eyes 4 (four) times daily.     Historical Provider, MD  travoprost, benzalkonium, (TRAVATAN) 0.004 % ophthalmic solution Place 1 drop into both eyes at bedtime. 08/28/15   Charlynne Cousins, MD    Family History Family History  Problem Relation Age of Onset  . Rectal cancer Sister   . Lung cancer Father   . Esophageal cancer Father   . Breast cancer Sister 54  . Breast cancer Mother 61  . Colon cancer Other     Aunt, Uncle  . Liver cancer Brother     Nephew  . Diabetes Sister   . Diabetes Brother   . Diabetes Other     son & daughter  . Heart disease Sister   . Heart disease Brother   . Kidney disease Sister   . Breast cancer Maternal Aunt     patient reports 11 mat aunts with breast cancer  . Breast cancer Paternal Aunt 85  . Breast cancer Maternal Grandmother     unk age    Social History Social History  Substance Use Topics  . Smoking status: Never Smoker  . Smokeless tobacco: Never Used  . Alcohol use No     Allergies   Ace inhibitors; Latex; and Percocet [oxycodone-acetaminophen]   Review of Systems Review of Systems  Constitutional: Negative for chills and fever.  Respiratory: Positive for cough and shortness of breath.   Cardiovascular: Positive for chest pain and leg swelling.  Gastrointestinal: Positive for abdominal pain. Negative for constipation, diarrhea, nausea and vomiting.  Genitourinary: Negative for difficulty urinating, dysuria, flank pain, frequency and hematuria.  Musculoskeletal: Positive for back pain and myalgias. Negative for neck pain.  Skin: Negative for rash and wound.  Neurological: Negative for dizziness, syncope, weakness, light-headedness, numbness and headaches.  Psychiatric/Behavioral: Positive for sleep disturbance.  All other systems reviewed and are  negative.    Physical Exam Updated Vital Signs BP (!) 160/102   Pulse 70   Temp 98.6 F (37 C) (Oral)   Resp 15   SpO2 99%   Physical Exam  Constitutional: She is oriented to person, place, and time. She appears well-developed and well-nourished. No distress.  Over-weight  HENT:  Head: Normocephalic and atraumatic.  Mouth/Throat: Oropharynx is clear and moist. No oropharyngeal exudate.  Eyes: EOM are normal. Pupils are equal, round, and reactive to light.  Mild bilateral exophthalmos. Pupils are 2 mm and minimally reactive bilaterally.  Neck: Normal range of motion. Neck supple.  No meningismus.  Cardiovascular: Normal rate and regular rhythm.  Exam reveals no gallop and no friction rub.   No murmur heard. Pulmonary/Chest: Effort normal. No respiratory distress. She has no wheezes. She has no rales. She exhibits no tenderness.  Diminished breath sounds in bilateral bases.  Abdominal: Soft. Bowel sounds are normal. There is tenderness (mild epigastric tenderness to palpation. There is no rebound or guarding.). There is no rebound and no guarding.  Musculoskeletal: Normal range of motion. She exhibits no edema or tenderness.  Diffuse lumbar paraspinal tenderness to palpation. No bilateral CVA tenderness. No midline thoracic or lumbar tenderness. No step-offs or deformity. Bilateral lower extremity swelling without pitting edema. Mild diffuse tenderness to palpation. There is no joint effusions appreciated. Distal pulses are 2+.  Neurological: She is alert and oriented to person, place, and  time.  Patient is alert and oriented x3 with clear, goal oriented speech. Patient has 5/5 motor in all extremities. Sensation is intact to light touch. Patient has a normal gait and walks without assistance.  Skin: Skin is warm and dry. No rash noted. No erythema.  1 cm abscess to the right abdominal wall. No drainage. No erythema or warmth.  Psychiatric: Her behavior is normal.  Flat affect. Poor  eye contact.  Nursing note and vitals reviewed.    ED Treatments / Results  DIAGNOSTIC STUDIES:  Oxygen Saturation is 100% on RA, normal by my interpretation.    COORDINATION OF CARE:  11:23 PM Discussed treatment plan with pt at bedside and pt agreed to plan.  Labs (all labs ordered are listed, but only abnormal results are displayed) Labs Reviewed  COMPREHENSIVE METABOLIC PANEL - Abnormal; Notable for the following:       Result Value   Glucose, Bld 133 (*)    AST 11 (*)    ALT 12 (*)    All other components within normal limits  CBC - Abnormal; Notable for the following:    Hemoglobin 10.1 (*)    HCT 32.0 (*)    MCV 66.0 (*)    MCH 20.8 (*)    RDW 20.4 (*)    All other components within normal limits  URINALYSIS, ROUTINE W REFLEX MICROSCOPIC (NOT AT [facility]) - Abnormal; Notable for the following:    Protein, ur 100 (*)    All other components within normal limits  D-DIMER, QUANTITATIVE (NOT AT [facility]) - Abnormal; Notable for the following:    D-Dimer, Quant 0.55 (*)    All other components within normal limits  URINE MICROSCOPIC-ADD ON - Abnormal; Notable for the following:    Squamous Epithelial / LPF 0-5 (*)    Bacteria, UA RARE (*)    All other components within normal limits  LIPASE, BLOOD  CK  BRAIN NATRIURETIC PEPTIDE  TSH  T4, FREE  I-STAT TROPOININ, ED    EKG  EKG Interpretation None       Radiology Dg Abd Acute W/chest  Result Date: 09/14/2016 CLINICAL DATA:  Abdominal pain, nausea and vomiting.  Diarrhea. EXAM: DG ABDOMEN ACUTE W/ 1V CHEST COMPARISON:  None. FINDINGS: Shallow lung inflation. Cardiomediastinal silhouette is upper normal for size. No focal airspace consolidation or pulmonary edema. No pneumothorax or sizable pleural effusion. There is no free intraperitoneal air. There is a moderate amount of stool within the colon. There is minimal small bowel gas. No radiographic evidence of obstruction. No abnormal calcifications or masses.  IMPRESSION: 1. Shallow lung inflation without focal airspace disease. 2. No free intraperitoneal air or radiographic evidence of obstruction. Electronically Signed   By: Ulyses Jarred M.D.   On: 09/14/2016 00:35    Procedures Procedures (including critical care time)  Medications Ordered in ED Medications  gi cocktail (Maalox,Lidocaine,Donnatal) (30 mLs Oral Given 09/14/16 0020)     Initial Impression / Assessment and Plan / ED Course  I have reviewed the triage vital signs and the nursing notes.  Pertinent labs & imaging results that were available during my care of the patient were reviewed by me and considered in my medical decision making (see chart for details).  Clinical Course    Patient is resting comfortably. Troponin 2 is normal. Mild elevation in d-dimer. We will set up to have bilateral lower extremity Dopplers done as an outpatient. Have rather low suspicion for DVT. Will start on anticoagulants unless DVT demonstrated  on ultrasound. Patient does have moderate amount stool in colon. We'll start on bowel regimen. Abdomen is benign. Do not believe further imaging is necessary. Patient's been given return precautions and is voiced understanding.   Final Clinical Impressions(s) / ED Diagnoses   Final diagnoses:  Myalgia  Atypical chest pain  Constipation, unspecified constipation type    New Prescriptions New Prescriptions   DOCUSATE SODIUM (COLACE) 100 MG CAPSULE    Take 1 capsule (100 mg total) by mouth 2 (two) times daily as needed for mild constipation.   POLYETHYLENE GLYCOL (MIRALAX / GLYCOLAX) PACKET    Take 17 g by mouth daily.   I personally performed the services described in this documentation, which was scribed in my presence. The recorded information has been reviewed and is accurate.       Julianne Rice, MD 09/14/16 [phone]

## 2016-09-14 ENCOUNTER — Emergency Department (HOSPITAL_COMMUNITY): Payer: Medicaid Other

## 2016-09-14 LAB — URINALYSIS, ROUTINE W REFLEX MICROSCOPIC
BILIRUBIN URINE: NEGATIVE
Glucose, UA: NEGATIVE mg/dL
Hgb urine dipstick: NEGATIVE
KETONES UR: NEGATIVE mg/dL
LEUKOCYTES UA: NEGATIVE
NITRITE: NEGATIVE
PH: 6 (ref 5.0–8.0)
PROTEIN: 100 mg/dL — AB
Specific Gravity, Urine: 1.015 (ref 1.005–1.030)

## 2016-09-14 LAB — BRAIN NATRIURETIC PEPTIDE: B NATRIURETIC PEPTIDE 5: 15.8 pg/mL (ref 0.0–100.0)

## 2016-09-14 LAB — D-DIMER, QUANTITATIVE (NOT AT ARMC): D DIMER QUANT: 0.55 ug{FEU}/mL — AB (ref 0.00–0.50)

## 2016-09-14 LAB — URINE MICROSCOPIC-ADD ON
RBC / HPF: NONE SEEN RBC/hpf (ref 0–5)
WBC, UA: NONE SEEN WBC/hpf (ref 0–5)

## 2016-09-14 LAB — CK: CK TOTAL: 117 U/L (ref 38–234)

## 2016-09-14 LAB — T4, FREE: Free T4: 0.84 ng/dL (ref 0.61–1.12)

## 2016-09-14 LAB — TSH: TSH: 1.124 u[IU]/mL (ref 0.350–4.500)

## 2016-09-14 MED ORDER — POLYETHYLENE GLYCOL 3350 17 G PO PACK: 17.0000 g | PACK | Freq: Every day | ORAL | 0 refills | Status: DC

## 2016-09-14 MED ORDER — GI COCKTAIL ~~LOC~~
30.0000 mL | Freq: Once | ORAL | Status: AC
  Administered 2016-09-14: 30 mL via ORAL
  Filled 2016-09-14: qty 30

## 2016-09-14 MED ORDER — DOCUSATE SODIUM 100 MG PO CAPS: 100.0000 mg | ORAL_CAPSULE | Freq: Two times a day (BID) | ORAL | 0 refills | Status: DC | PRN

## 2016-09-14 NOTE — ED Notes (Signed)
Patient transported to X-ray 

## 2016-09-14 NOTE — ED Notes (Signed)
Pt encouraged to give a urine sample, given 2 glasses of ice water

## 2016-09-14 NOTE — ED Notes (Signed)
Pot taken to restroom via wheelchair; pt instructed to provide urine sample

## 2016-09-18 ENCOUNTER — Encounter (HOSPITAL_COMMUNITY): Payer: Self-pay | Admitting: General Practice

## 2016-09-18 ENCOUNTER — Encounter (HOSPITAL_COMMUNITY)
Admission: RE | Admit: 2016-09-18 | Discharge: 2016-09-18 | Disposition: A | Discharge status: Home / Self Care | Payer: Medicaid Other | Source: Ambulatory Visit | Attending: Oral Surgery | Admitting: Oral Surgery

## 2016-09-18 ENCOUNTER — Encounter (HOSPITAL_COMMUNITY): Payer: Self-pay

## 2016-09-18 ENCOUNTER — Inpatient Hospital Stay (HOSPITAL_COMMUNITY)
Admission: AD | Admit: 2016-09-18 | Discharge: 2016-09-21 | DRG: 287 | Disposition: A | Discharge status: Home / Self Care | Payer: Medicaid Other | Source: Ambulatory Visit | Attending: Cardiovascular Disease | Admitting: Cardiovascular Disease

## 2016-09-18 ENCOUNTER — Encounter (HOSPITAL_COMMUNITY): Payer: Self-pay | Admitting: Emergency Medicine

## 2016-09-18 DIAGNOSIS — F329 Major depressive disorder, single episode, unspecified: Secondary | ICD-10-CM | POA: Diagnosis present

## 2016-09-18 DIAGNOSIS — E119 Type 2 diabetes mellitus without complications: Secondary | ICD-10-CM | POA: Diagnosis present

## 2016-09-18 DIAGNOSIS — I2510 Atherosclerotic heart disease of native coronary artery without angina pectoris: Secondary | ICD-10-CM | POA: Diagnosis present

## 2016-09-18 DIAGNOSIS — I10 Essential (primary) hypertension: Secondary | ICD-10-CM | POA: Diagnosis present

## 2016-09-18 DIAGNOSIS — E785 Hyperlipidemia, unspecified: Secondary | ICD-10-CM | POA: Diagnosis present

## 2016-09-18 DIAGNOSIS — K219 Gastro-esophageal reflux disease without esophagitis: Secondary | ICD-10-CM | POA: Diagnosis present

## 2016-09-18 DIAGNOSIS — Z955 Presence of coronary angioplasty implant and graft: Secondary | ICD-10-CM

## 2016-09-18 DIAGNOSIS — G4733 Obstructive sleep apnea (adult) (pediatric): Secondary | ICD-10-CM | POA: Diagnosis present

## 2016-09-18 DIAGNOSIS — D509 Iron deficiency anemia, unspecified: Secondary | ICD-10-CM | POA: Diagnosis present

## 2016-09-18 DIAGNOSIS — Z7982 Long term (current) use of aspirin: Secondary | ICD-10-CM

## 2016-09-18 DIAGNOSIS — I509 Heart failure, unspecified: Secondary | ICD-10-CM | POA: Diagnosis present

## 2016-09-18 DIAGNOSIS — Z794 Long term (current) use of insulin: Secondary | ICD-10-CM

## 2016-09-18 DIAGNOSIS — I110 Hypertensive heart disease with heart failure: Secondary | ICD-10-CM | POA: Diagnosis present

## 2016-09-18 DIAGNOSIS — E781 Pure hyperglyceridemia: Secondary | ICD-10-CM | POA: Diagnosis present

## 2016-09-18 DIAGNOSIS — R072 Precordial pain: Principal | ICD-10-CM

## 2016-09-18 DIAGNOSIS — E669 Obesity, unspecified: Secondary | ICD-10-CM | POA: Diagnosis present

## 2016-09-18 DIAGNOSIS — Z6841 Body Mass Index (BMI) 40.0 and over, adult: Secondary | ICD-10-CM

## 2016-09-18 DIAGNOSIS — E039 Hypothyroidism, unspecified: Secondary | ICD-10-CM | POA: Diagnosis present

## 2016-09-18 DIAGNOSIS — D573 Sickle-cell trait: Secondary | ICD-10-CM | POA: Diagnosis present

## 2016-09-18 DIAGNOSIS — M109 Gout, unspecified: Secondary | ICD-10-CM | POA: Diagnosis present

## 2016-09-18 DIAGNOSIS — Z79899 Other long term (current) drug therapy: Secondary | ICD-10-CM

## 2016-09-18 DIAGNOSIS — F419 Anxiety disorder, unspecified: Secondary | ICD-10-CM | POA: Diagnosis present

## 2016-09-18 DIAGNOSIS — J45909 Unspecified asthma, uncomplicated: Secondary | ICD-10-CM | POA: Diagnosis present

## 2016-09-18 DIAGNOSIS — I252 Old myocardial infarction: Secondary | ICD-10-CM

## 2016-09-18 HISTORY — DX: Fibromyalgia: M79.7

## 2016-09-18 HISTORY — DX: Angina pectoris, unspecified: I20.9

## 2016-09-18 HISTORY — DX: Disease of blood and blood-forming organs, unspecified: D75.9

## 2016-09-18 HISTORY — DX: Dyspnea, unspecified: R06.00

## 2016-09-18 LAB — CBC
HEMATOCRIT: 30.6 % — AB (ref 36.0–46.0)
HEMOGLOBIN: 9.3 g/dL — AB (ref 12.0–15.0)
MCH: 20.2 pg — ABNORMAL LOW (ref 26.0–34.0)
MCHC: 30.4 g/dL (ref 30.0–36.0)
MCV: 66.5 fL — ABNORMAL LOW (ref 78.0–100.0)
Platelets: 251 10*3/uL (ref 150–400)
RBC: 4.6 MIL/uL (ref 3.87–5.11)
RDW: 20.3 % — ABNORMAL HIGH (ref 11.5–15.5)
WBC: 7.5 10*3/uL (ref 4.0–10.5)

## 2016-09-18 LAB — COMPREHENSIVE METABOLIC PANEL
ALT: 12 U/L — ABNORMAL LOW (ref 14–54)
ANION GAP: 6 (ref 5–15)
AST: 10 U/L — ABNORMAL LOW (ref 15–41)
Albumin: 3.6 g/dL (ref 3.5–5.0)
Alkaline Phosphatase: 54 U/L (ref 38–126)
BILIRUBIN TOTAL: 0.2 mg/dL — AB (ref 0.3–1.2)
BUN: 8 mg/dL (ref 6–20)
CO2: 28 mmol/L (ref 22–32)
Calcium: 8.8 mg/dL — ABNORMAL LOW (ref 8.9–10.3)
Chloride: 108 mmol/L (ref 101–111)
Creatinine, Ser: 0.8 mg/dL (ref 0.44–1.00)
GFR calc Af Amer: >60 mL/min (ref >60)
Glucose, Bld: 118 mg/dL — ABNORMAL HIGH (ref 65–99)
POTASSIUM: 4 mmol/L (ref 3.5–5.1)
Sodium: 142 mmol/L (ref 135–145)
TOTAL PROTEIN: 6.7 g/dL (ref 6.5–8.1)

## 2016-09-18 LAB — MRSA PCR SCREENING: MRSA by PCR: NEGATIVE

## 2016-09-18 LAB — GLUCOSE, CAPILLARY
GLUCOSE-CAPILLARY: 154 mg/dL — AB (ref 65–99)
GLUCOSE-CAPILLARY: 107 mg/dL — AB (ref 65–99)
GLUCOSE-CAPILLARY: 149 mg/dL — AB (ref 65–99)

## 2016-09-18 LAB — HEPARIN LEVEL (UNFRACTIONATED): HEPARIN UNFRACTIONATED: 0.36 [IU]/mL (ref 0.30–0.70)

## 2016-09-18 LAB — TROPONIN I: Troponin I: <0.03 ng/mL (ref <0.03)

## 2016-09-18 LAB — APTT: APTT: 27 s (ref 24–36)

## 2016-09-18 LAB — PROTIME-INR
INR: 0.95
PROTHROMBIN TIME: 12.7 s (ref 11.4–15.2)

## 2016-09-18 MED ORDER — GABAPENTIN 300 MG PO CAPS
300.0000 mg | ORAL_CAPSULE | Freq: Every day | ORAL | Status: DC
  Administered 2016-09-18 – 2016-09-20 (×3): 300 mg via ORAL
  Filled 2016-09-18 (×3): qty 1

## 2016-09-18 MED ORDER — POLYETHYLENE GLYCOL 3350 17 G PO PACK
17.0000 g | PACK | Freq: Every day | ORAL | Status: DC
  Administered 2016-09-21: 17 g via ORAL
  Filled 2016-09-18 (×3): qty 1

## 2016-09-18 MED ORDER — METHOCARBAMOL 500 MG PO TABS
500.0000 mg | ORAL_TABLET | Freq: Two times a day (BID) | ORAL | Status: DC
  Administered 2016-09-18 – 2016-09-21 (×6): 500 mg via ORAL
  Filled 2016-09-18 (×6): qty 1

## 2016-09-18 MED ORDER — DICLOFENAC SODIUM 1 % TD GEL
2.0000 g | TRANSDERMAL | Status: DC | PRN
  Administered 2016-09-18: 2 g via TOPICAL
  Filled 2016-09-18: qty 100

## 2016-09-18 MED ORDER — SODIUM CHLORIDE 0.9 % IV SOLN: 250.0000 mL | INTRAVENOUS | Status: DC | PRN

## 2016-09-18 MED ORDER — CYCLOSPORINE 0.05 % OP EMUL
1.0000 [drp] | Freq: Two times a day (BID) | OPHTHALMIC | Status: DC
  Administered 2016-09-18 – 2016-09-21 (×6): 1 [drp] via OPHTHALMIC
  Filled 2016-09-18 (×6): qty 1

## 2016-09-18 MED ORDER — METOPROLOL TARTRATE 50 MG PO TABS
50.0000 mg | ORAL_TABLET | Freq: Two times a day (BID) | ORAL | Status: DC
  Administered 2016-09-18 – 2016-09-21 (×7): 50 mg via ORAL
  Filled 2016-09-18 (×7): qty 1

## 2016-09-18 MED ORDER — HEPARIN (PORCINE) IN NACL 100-0.45 UNIT/ML-% IJ SOLN
14.0000 [IU]/kg/h | INTRAMUSCULAR | Status: DC
  Administered 2016-09-18: 14 [IU]/kg/h via INTRAVENOUS
  Filled 2016-09-18: qty 250

## 2016-09-18 MED ORDER — TRAVOPROST (BAK FREE) 0.004 % OP SOLN
1.0000 [drp] | Freq: Every day | OPHTHALMIC | Status: DC
  Administered 2016-09-18 – 2016-09-20 (×3): 1 [drp] via OPHTHALMIC
  Filled 2016-09-18: qty 2.5

## 2016-09-18 MED ORDER — ALUM & MAG HYDROXIDE-SIMETH 200-200-20 MG/5ML PO SUSP
30.0000 mL | ORAL | Status: DC | PRN
  Administered 2016-09-18: 30 mL via ORAL
  Filled 2016-09-18: qty 30

## 2016-09-18 MED ORDER — TOBRAMYCIN 0.3 % OP OINT
1.0000 "application " | TOPICAL_OINTMENT | Freq: Four times a day (QID) | OPHTHALMIC | Status: DC
  Administered 2016-09-18 – 2016-09-21 (×8): 1 via OPHTHALMIC
  Filled 2016-09-18 (×2): qty 3.5

## 2016-09-18 MED ORDER — SODIUM CHLORIDE 0.9% FLUSH
3.0000 mL | Freq: Two times a day (BID) | INTRAVENOUS | Status: DC
  Administered 2016-09-20: 3 mL via INTRAVENOUS

## 2016-09-18 MED ORDER — SIMVASTATIN 20 MG PO TABS
20.0000 mg | ORAL_TABLET | Freq: Every day | ORAL | Status: DC
  Administered 2016-09-19 – 2016-09-20 (×2): 20 mg via ORAL
  Filled 2016-09-18 (×2): qty 1

## 2016-09-18 MED ORDER — DICYCLOMINE HCL 10 MG PO CAPS
10.0000 mg | ORAL_CAPSULE | Freq: Three times a day (TID) | ORAL | Status: DC
  Administered 2016-09-18 – 2016-09-21 (×6): 10 mg via ORAL
  Filled 2016-09-18 (×6): qty 1

## 2016-09-18 MED ORDER — NITROGLYCERIN 0.4 MG SL SUBL: 0.4000 mg | SUBLINGUAL_TABLET | SUBLINGUAL | Status: DC | PRN

## 2016-09-18 MED ORDER — CITALOPRAM HYDROBROMIDE 10 MG PO TABS
10.0000 mg | ORAL_TABLET | Freq: Every day | ORAL | Status: DC
  Administered 2016-09-18 – 2016-09-21 (×4): 10 mg via ORAL
  Filled 2016-09-18 (×4): qty 1

## 2016-09-18 MED ORDER — ASPIRIN EC 81 MG PO TBEC
81.0000 mg | DELAYED_RELEASE_TABLET | Freq: Every day | ORAL | Status: DC
  Administered 2016-09-19 – 2016-09-21 (×3): 81 mg via ORAL
  Filled 2016-09-18 (×4): qty 1

## 2016-09-18 MED ORDER — COLCHICINE 0.6 MG PO TABS
0.6000 mg | ORAL_TABLET | Freq: Every day | ORAL | Status: DC
  Administered 2016-09-19 – 2016-09-21 (×3): 0.6 mg via ORAL
  Filled 2016-09-18 (×4): qty 1

## 2016-09-18 MED ORDER — ALPRAZOLAM 0.5 MG PO TABS: 0.5000 mg | ORAL_TABLET | Freq: Every evening | ORAL | Status: DC | PRN

## 2016-09-18 MED ORDER — FENOFIBRATE 160 MG PO TABS
160.0000 mg | ORAL_TABLET | Freq: Every day | ORAL | Status: DC
  Administered 2016-09-18 – 2016-09-21 (×4): 160 mg via ORAL
  Filled 2016-09-18 (×4): qty 1

## 2016-09-18 MED ORDER — METFORMIN HCL 500 MG PO TABS
500.0000 mg | ORAL_TABLET | Freq: Two times a day (BID) | ORAL | Status: DC
  Administered 2016-09-18 – 2016-09-20 (×2): 500 mg via ORAL
  Filled 2016-09-18 (×2): qty 1

## 2016-09-18 MED ORDER — AMLODIPINE BESYLATE 10 MG PO TABS
10.0000 mg | ORAL_TABLET | Freq: Every day | ORAL | Status: DC
  Administered 2016-09-19 – 2016-09-21 (×3): 10 mg via ORAL
  Filled 2016-09-18 (×4): qty 1

## 2016-09-18 MED ORDER — HYDROCODONE-ACETAMINOPHEN 5-325 MG PO TABS
1.0000 | ORAL_TABLET | Freq: Four times a day (QID) | ORAL | Status: DC | PRN
  Administered 2016-09-18 – 2016-09-21 (×7): 1 via ORAL
  Filled 2016-09-18 (×7): qty 1

## 2016-09-18 MED ORDER — POTASSIUM CHLORIDE CRYS ER 10 MEQ PO TBCR
10.0000 meq | EXTENDED_RELEASE_TABLET | Freq: Three times a day (TID) | ORAL | Status: DC
  Administered 2016-09-18 – 2016-09-21 (×9): 10 meq via ORAL
  Filled 2016-09-18 (×12): qty 1

## 2016-09-18 MED ORDER — FERROUS SULFATE 325 (65 FE) MG PO TABS
325.0000 mg | ORAL_TABLET | Freq: Every day | ORAL | Status: DC
  Administered 2016-09-19 – 2016-09-21 (×3): 325 mg via ORAL
  Filled 2016-09-18 (×4): qty 1

## 2016-09-18 MED ORDER — AMOXICILLIN-POT CLAVULANATE 875-125 MG PO TABS
1.0000 | ORAL_TABLET | Freq: Two times a day (BID) | ORAL | Status: DC
  Administered 2016-09-18 – 2016-09-21 (×6): 1 via ORAL
  Filled 2016-09-18 (×8): qty 1

## 2016-09-18 MED ORDER — HEPARIN BOLUS VIA INFUSION
4000.0000 [IU] | Freq: Once | INTRAVENOUS | Status: AC
  Administered 2016-09-18: 4000 [IU] via INTRAVENOUS
  Filled 2016-09-18: qty 4000

## 2016-09-18 MED ORDER — SODIUM CHLORIDE 0.9% FLUSH: 3.0000 mL | INTRAVENOUS | Status: DC | PRN

## 2016-09-18 MED ORDER — IPRATROPIUM BROMIDE 0.02 % IN SOLN: 0.5000 mg | Freq: Three times a day (TID) | RESPIRATORY_TRACT | Status: DC | PRN

## 2016-09-18 MED ORDER — ALBUTEROL SULFATE (2.5 MG/3ML) 0.083% IN NEBU: 3.0000 mL | INHALATION_SOLUTION | Freq: Four times a day (QID) | RESPIRATORY_TRACT | Status: DC | PRN

## 2016-09-18 MED ORDER — DOCUSATE SODIUM 100 MG PO CAPS: 100.0000 mg | ORAL_CAPSULE | Freq: Two times a day (BID) | ORAL | Status: DC | PRN

## 2016-09-18 NOTE — Pre-Procedure Instructions (Signed)
Chelsea Lester  09/18/2016      [facility], [place] - [place] [address] Phone: [phone] Fax: [phone]  [facility] [place], [place] - [address] [place] [address] Phone: [phone] Fax: [phone]    Your procedure is scheduled on Friday, November 17th, 2017.  Report to [facility] Admitting at 5:30 A.M.   Call this number if you have problems the morning of surgery:  [phone]   Remember:  Do not eat food or drink liquids after midnight.   Take these medicines the morning of surgery with A SIP OF WATER: Albuterol Inhaler if needed (please bring with you), Amlodipine (Norvasc), Citalopram (Celexa), Colchicine, Famotidine (Pepcid), Fluticasone (Flonase), Fluticasone-salmeterol (Advair) inhaler, Gabapentin (Neurontin), Hydrocodone-acetaminophen (Norco) if needed, Hyoscyamine (Levbid), Ipratropium (Atrovent) nebulizer if needed, Loratadine (Claritin), Methocarbamol (Robaxin), Metoprolol (Lopressor), Ondansetron (Zofran) if needed, Pantoprazole (Protonix), Eye drops.  Stop taking: Aspirin, NSAIDS, Diclofenac Sodium (Votaren) gel, Ibuprofen, Advil, Motrin, BC's, Goody's, Fish oil, all herbal medications, and all vitamins.    WHAT DO I DO ABOUT MY DIABETES MEDICATION?  Marland Kitchen Do not take oral diabetes medicines (pills) the morning of surgery.  Do NOT take Metformin on day of surgery.    . THE NIGHT BEFORE SURGERY, take 42 units of Novolin 70/30 insulin.      . THE MORNING OF SURGERY, take 0 units of Novolin 70/30 insulin.  . The day of surgery, do not take other diabetes injectables, including Byetta (exenatide), Bydureon (exenatide ER), Victoza (liraglutide), or Trulicity (dulaglutide).   How to Manage Your Diabetes Before and After Surgery  Why is it important to control my blood sugar before and after surgery? . Improving blood sugar  levels before and after surgery helps healing and can limit problems. . A way of improving blood sugar control is eating a healthy diet by: o  Eating less sugar and carbohydrates o  Increasing activity/exercise o  Talking with your doctor about reaching your blood sugar goals . High blood sugars (greater than 180 mg/dL) can raise your risk of infections and slow your recovery, so you will need to focus on controlling your diabetes during the weeks before surgery. . Make sure that the doctor who takes care of your diabetes knows about your planned surgery including the date and location.  How do I manage my blood sugar before surgery? . Check your blood sugar at least 4 times a day, starting 2 days before surgery, to make sure that the level is not too high or low. o Check your blood sugar the morning of your surgery when you wake up and every 2 hours until you get to the Short Stay unit. . If your blood sugar is less than 70 mg/dL, you will need to treat for low blood sugar: o Do not take insulin. o Treat a low blood sugar (less than 70 mg/dL) with  cup of clear juice (cranberry or apple), 4 glucose tablets, OR glucose gel. o Recheck blood sugar in 15 minutes after treatment (to make sure it is greater than 70 mg/dL). If your blood sugar is not greater than 70 mg/dL on recheck, call [phone] for further instructions. . Report your blood sugar to the short stay nurse when you get to Short Stay.  . If you are admitted to the hospital after surgery: o Your blood sugar will be checked by the staff and you  will probably be given insulin after surgery (instead of oral diabetes medicines) to make sure you have good blood sugar levels. o The goal for blood sugar control after surgery is 80-180 mg/dL.    Do not wear jewelry, make-up or nail polish.  Do not wear lotions, powders, or perfumes, or deoderant.  Do not shave 48 hours prior to surgery.    Do not bring valuables to the hospital.  [facility] is not responsible for any belongings or valuables.  Contacts, dentures or bridgework may not be worn into surgery.  Leave your suitcase in the car.  After surgery it may be brought to your room.  For patients admitted to the hospital, discharge time will be determined by your treatment team.  Patients discharged the day of surgery will not be allowed to drive home.   Special instructions:  Preparing for Surgery.   Please read over the following fact sheets that you were given. MRSA Information

## 2016-09-18 NOTE — Progress Notes (Signed)
ANTICOAGULATION CONSULT NOTE - Follow Up Consult  Pharmacy Consult for Heparin Indication: chest pain/ACS  Allergies  Allergen Reactions  . Ace Inhibitors     Other reaction(s): OTHER  . Latex Rash    unknown  . Percocet [Oxycodone-Acetaminophen] Itching         Patient Measurements: Height: 5\' 6"  (167.6 cm) Weight: 297 lb 12.8 oz (135.1 kg) IBW/kg (Calculated) : 59.3 Heparin Dosing Weight: 92.4 kg  Vital Signs: Temp: 98.7 F (37.1 C) (11/14 2100) Temp Source: Oral (11/14 2100) BP: 146/80 (11/14 2100) Pulse Rate: 74 (11/14 2100)  Labs:  Recent Labs  09/18/16 1432 09/18/16 1912 09/18/16 2058  HGB 9.3*  --   --   HCT 30.6*  --   --   PLT 251  --   --   APTT 27  --   --   LABPROT 12.7  --   --   INR 0.95  --   --   HEPARINUNFRC  --   --  0.36  CREATININE 0.80  --   --   TROPONINI <0.03 <0.03  --     Estimated Creatinine Clearance: 105.8 mL/min (by C-G formula based on SCr of 0.8 mg/dL).  Assessment: 60 y.o female scheduled for multiple dental extractions with alveoloplasty on 09/21/2016. Patient complaining of chest pain. Cardiologist has consulted pharmacy to start IV heparin infusion. HL 0.36 in goal range.  Goal of Therapy:  Heparin level 0.3-0.7 units/ml Monitor platelets by anticoagulation protocol: Yes   Plan:  Continue IV heparin at 1300 units/hr Daily HL and CBC   Bradrick Kamau S. Alford [place], PharmD, BCPS Clinical Staff Pharmacist Pager [phone]  Chelsea Lester 09/18/2016,10:01 PM

## 2016-09-18 NOTE — Progress Notes (Signed)
Anesthesia Chart Review:  Pt is a 60 year old female scheduled for multiple dental extractions with alveoloplasty on 09/21/2016 with Diona Browner, DDS.   - Cardiologist is Dixie Dials, MD.   PMH includes:  CAD, HTN, DM, CHF, hyperlipidemia, OSA, asthma, seizures, thyroid disease, GERD. Never smoker. BMI 48  Medications include: albuterol, amlodipine, augmentin, ASA, pepcid, fenofibrate, iron, advair, novolin 70/30, atrovent, metformin, metoprolol, protonix, potassium, simvastatin  Labs from ER visit 09/13/16 reviewed. CBC and CMET acceptable for surgery.   CXR 06/23/16: Lungs hypoexpanded. Mild bibasilar opacities likely reflect atelectasis, though mild pneumonia cannot be entirely excluded. Mild cardiomegaly.  EKG 09/13/16: Sinus rhythm. Abnormal R-wave progression, early transition  Echo 03/10/14:  - Left ventricle: The cavity size was normal. There was mild concentric hypertrophy. Systolic function was mildly reduced. The estimated ejection fraction was in the range of 45% to 50%. There is mild hypokinesis of the inferior myocardium. Doppler parameters are consistent with abnormal left ventricular relaxation (grade 1 diastolic dysfunction). - Mitral valve: Mild regurgitation. - Left atrium: The atrium was mildly dilated. - Pulmonary arteries: Systolic pressure was mildly increased. PA peak pressure: 15m Hg (S).  Cardiac cath 05/01/11:  1. Normal LV systolic function. EF 55-60%.  2. Normal coronary arteries.   Called to see pt in PAT for chest pain complaint. Pt reports having L chest pain with exertion associated with SOB for last 2-3 weeks. Last visit with Dr. KDoylene Canardwas 09/14/16 and he is aware of symptoms. Pt reports she has a stress test scheduled next week.  She reports symptoms are similar to what she has experienced in the past. On exam, HRR, lungs CTA B. Left chest wall tender to palpation in the location of pt's cp sx.  I spoke with Dr. KDoylene Canard He reports that pt's chest pain is  usually musculoskeletal. As sx were not significantly different than normal, pt did not need to go to ER, and could f/u with Dr. KDoylene Canardin his office.   I notified Sam in Dr. JLupita Leashoffice that pt would need clearance from Dr. KDoylene Canardfor surgery.   AWilleen Cass FNP-BC [facility] Phone: [phone]/14/2017 1:06 PM

## 2016-09-18 NOTE — Progress Notes (Signed)
ANTICOAGULATION CONSULT NOTE - Initial Consult  Pharmacy Consult for Heparin  Indication:  ACS / STEMI  Allergies  Allergen Reactions  . Ace Inhibitors     Other reaction(s): OTHER  . Latex Rash    unknown  . Percocet [Oxycodone-Acetaminophen] Itching         Patient Measurements: Height: 5\' 6"  (167.6 cm) Weight: 297 lb 12.8 oz (135.1 kg) IBW/kg (Calculated) : 59.3 Heparin Dosing Weight:92.4 kg  Vital Signs: Temp: 98.1 F (36.7 C) (11/14 0832) BP: 147/73 (11/14 1231) Pulse Rate: 76 (11/14 0832)  Labs: No results for input(s): HGB, HCT, PLT, APTT, LABPROT, INR, HEPARINUNFRC, HEPRLOWMOCWT, CREATININE, CKTOTAL, CKMB, TROPONINI in the last 72 hours.  Estimated Creatinine Clearance: 105.8 mL/min (by C-G formula based on SCr of 0.73 mg/dL).   Medical History: Past Medical History:  Diagnosis Date  . Abnormal findings-skull or head    "/Dr. 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 Anginal pain ([place])    LAST CP TODAY  . Anxiety   . Asthma   . Blood dyscrasia   . CAD (coronary artery disease)   . Cataract   . CHF (congestive heart failure) ([place])   . Chronic bronchitis ([place])    "get it q year"  . Chronic lower back pain   . Colon polyp   . Complication of anesthesia    "couldn't get me woke when they put m3e to sleep for my teeth"  . Depression 11/07   Hospitalization required  . Diverticulosis   . Dyspnea    W/ EXERTION      . Esophageal stricture   . Fibromyalgia   . GERD (gastroesophageal reflux disease)   . Gout   . Headache    "weekly" (08/26/2015)     LITTLE BETTER  09/18/16  . Hemorrhoids   . History of hiatal hernia   . Hyperlipidemia   . Hypertension   . IBS (irritable bowel syndrome)   . Lupus   . Microcytosis   . Myocardial infarction    "slight one years ago"  . Obesity   . OSA on CPAP    CPAP AT NIGHT W/ O2 PRN  . Osteoarthritis    "all over"  . Pneumonia "several times"  . Seizures ([place])   .  Sickle-cell trait ([place])   . Sleep disorder   . Thyroid disease    "? high or low" (08/26/2015)  . Type II diabetes mellitus ([facility])     Medications:  Prescriptions Prior to Admission  Medication Sig Dispense Refill Last Dose  . albuterol (PROVENTIL,VENTOLIN) 90 MCG/ACT inhaler Inhale 2 puffs into the lungs every 6 (six) hours as needed for shortness of breath. Wheezing or shortness of breath   09/18/2016 at Unknown time  . ALPRAZolam (XANAX) 0.5 MG tablet Take 1 tablet by mouth at bedtime as needed for anxiety or sleep.   0 09/17/2016 at Unknown time  . amLODipine (NORVASC) 10 MG tablet Take 10 mg by mouth daily.  0 09/18/2016 at Unknown time  . amoxicillin-clavulanate (AUGMENTIN) 875-125 MG tablet Take 1 tablet by mouth 2 (two) times daily. For 10 days   09/18/2016 at Unknown time  . aspirin 81 MG tablet Take 81 mg by mouth daily.     09/18/2016 at Unknown time  . citalopram (CELEXA) 10 MG tablet Take 10 mg by mouth daily.   0 09/17/2016 at Unknown time  . colchicine 0.6 MG tablet Take 0.6 mg by mouth daily.  0 09/18/2016 at Unknown time  . diclofenac sodium (VOLTAREN) 1 % GEL Apply 2 g topically 4 (four) times daily as needed (pain).    09/18/2016 at Unknown time  . dicyclomine (BENTYL) 10 MG capsule Take 1 tab 3 times a day before meals. 90 capsule 1 09/18/2016 at Unknown time  . docusate sodium (COLACE) 100 MG capsule Take 1 capsule (100 mg total) by mouth 2 (two) times daily as needed for mild constipation. 60 capsule 0 09/18/2016 at Unknown time  . ergocalciferol (VITAMIN D2) 50000 UNITS capsule Take 1 capsule (50,000 Units total) by mouth once a week. Usually takes on Saturday or fridays 30 capsule 0 09/18/2016 at Unknown time  . famotidine (PEPCID) 20 MG tablet Take 20 mg by mouth daily.   09/17/2016 at Unknown time  . fenofibrate (TRICOR) 145 MG tablet Take 1 tablet (145 mg total) by mouth daily. 30 tablet 0 09/17/2016 at Unknown time  . ferrous sulfate 325 (65 FE) MG tablet Take 1  tablet (325 mg total) by mouth daily with breakfast. 90 tablet 3 09/17/2016 at Unknown time  . fluticasone (FLONASE) 50 MCG/ACT nasal spray Place 1 spray into both nostrils 2 (two) times daily. 1 g 2 09/18/2016 at Unknown time  . fluticasone-salmeterol (ADVAIR HFA) 115-21 MCG/ACT inhaler Inhale 2 puffs into the lungs 2 (two) times daily. 1 Inhaler 12 09/18/2016 at Unknown time  . gabapentin (NEURONTIN) 300 MG capsule Take 1 capsule (300 mg total) by mouth at bedtime. 30 capsule 2 09/17/2016 at Unknown time  . HYDROcodone-acetaminophen (VICODIN) 5-500 MG tablet Take 1 tablet by mouth every 6 (six) hours as needed. (Patient taking differently: Take 1 tablet by mouth every 6 (six) hours as needed for pain. ) 10 tablet 0 09/18/2016 at Unknown time  . hyoscyamine (LEVBID) 0.375 MG 12 hr tablet Take 0.375 mg by mouth every 12 (twelve) hours as needed for cramping.   09/17/2016 at Unknown time  . insulin NPH-regular Human (NOVOLIN 70/30) (70-30) 100 UNIT/ML injection Inject 60 Units into the skin daily with breakfast. (Patient taking differently: Inject 60 Units into the skin 2 (two) times daily. ) 10 mL 11 09/18/2016 at Unknown time  . Insulin Syringes, Disposable, U-100 1 ML MISC by Does not apply route.     09/18/2016 at Unknown time  . ipratropium (ATROVENT) 0.02 % nebulizer solution Take 2.5 mLs (0.5 mg total) by nebulization 3 (three) times daily as needed for wheezing or shortness of breath. 75 mL 12 09/17/2016 at Unknown time  . Lancets MISC by Does not apply route.     09/18/2016 at Unknown time  . loratadine (CLARITIN) 10 MG tablet Take 10 mg by mouth daily.    09/18/2016 at Unknown time  . metFORMIN (GLUCOPHAGE) 500 MG tablet Take 2 tablets (1,000 mg total) by mouth 2 (two) times daily with a meal. (Patient taking differently: Take 500 mg by mouth 2 (two) times daily with a meal. ) 60 tablet 3 09/18/2016 at Unknown time  . methocarbamol (ROBAXIN) 500 MG tablet Take 1 tablet (500 mg total) by mouth 2  (two) times daily. 20 tablet 0 09/18/2016 at Unknown time  . metoprolol (LOPRESSOR) 50 MG tablet Take 1 tablet (50 mg total) by mouth 2 (two) times daily. 30 tablet 0 09/17/2016 at Unknown time  . nitroGLYCERIN (NITROSTAT) 0.4 MG SL tablet Place 0.4 mg under the tongue every 5 (five) minutes as needed for chest pain.   0 Past Week at Unknown time  . NON FORMULARY  [employer] test strip( Glucose Blood)    09/18/2016 at Unknown time  . ondansetron (ZOFRAN ODT) 4 MG disintegrating tablet 4mg  ODT q4 hours prn nausea/vomit (Patient taking differently: Take 4 mg by mouth every 8 (eight) hours as needed for nausea or vomiting. 4mg  ODT q4 hours prn nausea/vomit) 20 tablet 0 09/18/2016 at Unknown time  . pantoprazole (PROTONIX) 40 MG tablet Take 1 tablet (40 mg total) by mouth daily. 30 mins prior to breakfast. 30 tablet 6 09/17/2016 at Unknown time  . polyethylene glycol (MIRALAX / GLYCOLAX) packet Take 17 g by mouth daily. 14 each 0 09/17/2016 at Unknown time  . potassium chloride (K-DUR) 10 MEQ tablet Take 1 tablet (10 mEq total) by mouth 3 (three) times daily. 45 tablet 0 09/18/2016 at Unknown time  . promethazine (PHENERGAN) 25 MG tablet Take 0.5 tablets (12.5 mg total) by mouth every 6 (six) hours as needed for nausea or vomiting. (Patient taking differently: Take 25 mg by mouth every 6 (six) hours as needed for nausea or vomiting. ) 30 tablet 0 09/17/2016 at Unknown time  . RESTASIS 0.05 % ophthalmic emulsion Place 1 drop into both eyes 2 (two) times daily.   0 09/18/2016 at Unknown time  . simvastatin (ZOCOR) 40 MG tablet Take 0.5 tablets (20 mg total) by mouth at bedtime. 30 tablet 0 09/18/2016 at Unknown time  . tobramycin (TOBREX) 0.3 % ophthalmic ointment Place 1 application into both eyes 4 (four) times daily.    09/18/2016 at Unknown time  . travoprost, benzalkonium, (TRAVATAN) 0.004 % ophthalmic solution Place 1 drop into both eyes at bedtime. 2.5 mL 12 09/17/2016 at Unknown time  . [DISCONTINUED]  insulin NPH-regular Human (NOVOLIN 70/30) (70-30) 100 UNIT/ML injection Inject 40 Units into the skin daily with supper. (Patient not taking: Reported on 09/18/2016) 10 mL 11 Not Taking at Unknown time   Scheduled:  . [START ON 09/19/2016] amLODipine  10 mg Oral Daily  . amoxicillin-clavulanate  1 tablet Oral BID  . [START ON 09/19/2016] aspirin EC  81 mg Oral Daily  . citalopram  10 mg Oral Daily  . [START ON 09/19/2016] colchicine  0.6 mg Oral Daily  . cycloSPORINE  1 drop Both Eyes BID  . dicyclomine  10 mg Oral TID AC  . fenofibrate  160 mg Oral Daily  . [START ON 09/19/2016] ferrous sulfate  325 mg Oral Q breakfast  . gabapentin  300 mg Oral QHS  . metFORMIN  500 mg Oral BID WC  . methocarbamol  500 mg Oral BID  . metoprolol  50 mg Oral BID  . polyethylene glycol  17 g Oral Daily  . potassium chloride  10 mEq Oral TID  . [START ON 09/19/2016] simvastatin  20 mg Oral QHS  . sodium chloride flush  3 mL Intravenous Q12H  . tobramycin  1 application Both Eyes QID  . Travoprost (BAK Free)  1 drop Both Eyes QHS    Assessment: 60 y.o female scheduled for multiple dental extractions with alveoloplasty on 09/21/2016 PMH includes:  CAD, HTN, DM, CHF, hyperlipidemia, OSA, asthma, seizures, thyroid disease, GERD. Never smoker, Obesity BMI 48 She is not on a anticoagulant prior to admission.   Patient complaining of chest pain. Cardiologist has consulted pharmacy to start IV heparin infusion.  Admission labs, CBC, PT/INR, aPTT pending  Goal of Therapy:  Heparin level 0.3-0.7 units/ml Monitor platelets by anticoagulation protocol: Yes   Plan:  Heparin 4000 units IV x1 , start heparin drip at 1300 units/hr  Check 6 hour heparin level Daily heparin level and CBC  Nicole Cella, RPh Clinical Pharmacist Phone 8a-3:30 M-F [phone] Phone 330p-1030p [phone] Main Rx [phone] 09/18/2016,2:05 PM

## 2016-09-18 NOTE — H&P (Signed)
Referring Physician:  AYEISHA EANES is an 60 y.o. female.                       Chief Complaint: Chest pain  HPI: 60 year old female with PMH of Asthma, CAD, CHF, chronic bronchitis, OSA, Dyslipidemia, DM type II, thyroid disease, Obesity and GERD has recurrent chest pain. She is awaiting alveoloplasty and multiple dental extractions by Dr. Diona Browner. No fever or chills.  Past Medical History:  Diagnosis Date  . Abnormal findings-skull or head    "/Dr. 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 Anginal pain ([place])    LAST CP TODAY  . Anxiety   . Asthma   . Blood dyscrasia   . CAD (coronary artery disease)   . Cataract   . CHF (congestive heart failure) ([place])   . Chronic bronchitis ([place])    "get it q year"  . Chronic lower back pain   . Colon polyp   . Complication of anesthesia    "couldn't get me woke when they put m3e to sleep for my teeth"  . Depression 11/07   Hospitalization required  . Diverticulosis   . Dyspnea    W/ EXERTION      . Esophageal stricture   . Fibromyalgia   . GERD (gastroesophageal reflux disease)   . Gout   . Headache    "weekly" (08/26/2015)     LITTLE BETTER  09/18/16  . Hemorrhoids   . History of hiatal hernia   . Hyperlipidemia   . Hypertension   . IBS (irritable bowel syndrome)   . Lupus   . Microcytosis   . Myocardial infarction    "slight one years ago"  . Obesity   . OSA on CPAP    CPAP AT NIGHT W/ O2 PRN  . Osteoarthritis    "all over"  . Pneumonia "several times"  . Seizures ([place])   . Sickle-cell trait ([place])   . Sleep disorder   . Thyroid disease    "? high or low" (08/26/2015)  . Type II diabetes mellitus ([place])       Past Surgical History:  Procedure Laterality Date  . ABDOMINAL HYSTERECTOMY    . COLONOSCOPY    . CORONARY ANGIOPLASTY WITH STENT PLACEMENT     "I''ve had alot"  . DILATION AND CURETTAGE OF UTERUS    . ESOPHAGOGASTRODUODENOSCOPY (EGD) WITH ESOPHAGEAL DILATION   "all the time"  . EYE SURGERY Bilateral    "had surgery on the sides of my eyes to keep them popping out"  . head surgery     metal plate placed  . MULTIPLE TOOTH EXTRACTIONS  ?1980's   "& cut into my jaw"  . TONSILLECTOMY    . TUBAL LIGATION    . UPPER GASTROINTESTINAL ENDOSCOPY      Family History  Problem Relation Age of Onset  . Rectal cancer Sister   . Lung cancer Father   . Esophageal cancer Father   . Breast cancer Sister 17  . Breast cancer Mother 58  . Colon cancer Other     Aunt, Uncle  . Liver cancer Brother     Nephew  . Diabetes Sister   . Diabetes Brother   . Diabetes Other     son & daughter  . Heart disease Sister   . Heart disease Brother   . Kidney disease Sister   . Breast cancer Maternal Aunt  patient reports 11 mat aunts with breast cancer  . Breast cancer Paternal Aunt 85  . Breast cancer Maternal Grandmother     unk age   Social History:  reports that she has never smoked. She has never used smokeless tobacco. She reports that she does not drink alcohol or use drugs.  Allergies:  Allergies  Allergen Reactions  . Ace Inhibitors     Other reaction(s): OTHER  . Latex Rash    unknown  . Percocet [Oxycodone-Acetaminophen] Itching         Medications Prior to Admission  Medication Sig Dispense Refill  . albuterol (PROVENTIL,VENTOLIN) 90 MCG/ACT inhaler Inhale 2 puffs into the lungs every 6 (six) hours as needed for shortness of breath. Wheezing or shortness of breath    . ALPRAZolam (XANAX) 0.5 MG tablet Take 1 tablet by mouth at bedtime as needed for anxiety or sleep.   0  . amLODipine (NORVASC) 10 MG tablet Take 10 mg by mouth daily.  0  . amoxicillin-clavulanate (AUGMENTIN) 875-125 MG tablet Take 1 tablet by mouth 2 (two) times daily. For 10 days    . aspirin 81 MG tablet Take 81 mg by mouth daily.      . citalopram (CELEXA) 10 MG tablet Take 10 mg by mouth daily.   0  . colchicine 0.6 MG tablet Take 0.6 mg by mouth daily.   0  .  diclofenac sodium (VOLTAREN) 1 % GEL Apply 2 g topically 4 (four) times daily as needed (pain).     Marland Kitchen dicyclomine (BENTYL) 10 MG capsule Take 1 tab 3 times a day before meals. 90 capsule 1  . docusate sodium (COLACE) 100 MG capsule Take 1 capsule (100 mg total) by mouth 2 (two) times daily as needed for mild constipation. 60 capsule 0  . ergocalciferol (VITAMIN D2) 50000 UNITS capsule Take 1 capsule (50,000 Units total) by mouth once a week. Usually takes on Saturday or fridays 30 capsule 0  . famotidine (PEPCID) 20 MG tablet Take 20 mg by mouth daily.    . fenofibrate (TRICOR) 145 MG tablet Take 1 tablet (145 mg total) by mouth daily. 30 tablet 0  . ferrous sulfate 325 (65 FE) MG tablet Take 1 tablet (325 mg total) by mouth daily with breakfast. 90 tablet 3  . fluticasone (FLONASE) 50 MCG/ACT nasal spray Place 1 spray into both nostrils 2 (two) times daily. 1 g 2  . fluticasone-salmeterol (ADVAIR HFA) 115-21 MCG/ACT inhaler Inhale 2 puffs into the lungs 2 (two) times daily. 1 Inhaler 12  . gabapentin (NEURONTIN) 300 MG capsule Take 1 capsule (300 mg total) by mouth at bedtime. 30 capsule 2  . HYDROcodone-acetaminophen (VICODIN) 5-500 MG tablet Take 1 tablet by mouth every 6 (six) hours as needed. (Patient taking differently: Take 1 tablet by mouth every 6 (six) hours as needed for pain. ) 10 tablet 0  . hyoscyamine (LEVBID) 0.375 MG 12 hr tablet Take 0.375 mg by mouth every 12 (twelve) hours as needed for cramping.    . insulin NPH-regular Human (NOVOLIN 70/30) (70-30) 100 UNIT/ML injection Inject 60 Units into the skin daily with breakfast. (Patient taking differently: Inject 60 Units into the skin 2 (two) times daily. ) 10 mL 11  . Insulin Syringes, Disposable, U-100 1 ML MISC by Does not apply route.      Marland Kitchen ipratropium (ATROVENT) 0.02 % nebulizer solution Take 2.5 mLs (0.5 mg total) by nebulization 3 (three) times daily as needed for wheezing or shortness of  breath. 75 mL 12  . Lancets MISC by  Does not apply route.      . loratadine (CLARITIN) 10 MG tablet Take 10 mg by mouth daily.     . metFORMIN (GLUCOPHAGE) 500 MG tablet Take 2 tablets (1,000 mg total) by mouth 2 (two) times daily with a meal. (Patient taking differently: Take 500 mg by mouth 2 (two) times daily with a meal. ) 60 tablet 3  . methocarbamol (ROBAXIN) 500 MG tablet Take 1 tablet (500 mg total) by mouth 2 (two) times daily. 20 tablet 0  . metoprolol (LOPRESSOR) 50 MG tablet Take 1 tablet (50 mg total) by mouth 2 (two) times daily. 30 tablet 0  . nitroGLYCERIN (NITROSTAT) 0.4 MG SL tablet Place 0.4 mg under the tongue every 5 (five) minutes as needed for chest pain.   0  . NON FORMULARY [employer] test strip( Glucose Blood)     . ondansetron (ZOFRAN ODT) 4 MG disintegrating tablet 4mg  ODT q4 hours prn nausea/vomit (Patient taking differently: Take 4 mg by mouth every 8 (eight) hours as needed for nausea or vomiting. 4mg  ODT q4 hours prn nausea/vomit) 20 tablet 0  . pantoprazole (PROTONIX) 40 MG tablet Take 1 tablet (40 mg total) by mouth daily. 30 mins prior to breakfast. 30 tablet 6  . polyethylene glycol (MIRALAX / GLYCOLAX) packet Take 17 g by mouth daily. 14 each 0  . potassium chloride (K-DUR) 10 MEQ tablet Take 1 tablet (10 mEq total) by mouth 3 (three) times daily. 45 tablet 0  . promethazine (PHENERGAN) 25 MG tablet Take 0.5 tablets (12.5 mg total) by mouth every 6 (six) hours as needed for nausea or vomiting. (Patient taking differently: Take 25 mg by mouth every 6 (six) hours as needed for nausea or vomiting. ) 30 tablet 0  . RESTASIS 0.05 % ophthalmic emulsion Place 1 drop into both eyes 2 (two) times daily.   0  . simvastatin (ZOCOR) 40 MG tablet Take 0.5 tablets (20 mg total) by mouth at bedtime. 30 tablet 0  . tobramycin (TOBREX) 0.3 % ophthalmic ointment Place 1 application into both eyes 4 (four) times daily.     . travoprost, benzalkonium, (TRAVATAN) 0.004 % ophthalmic solution Place 1 drop into both eyes at  bedtime. 2.5 mL 12  . [DISCONTINUED] insulin NPH-regular Human (NOVOLIN 70/30) (70-30) 100 UNIT/ML injection Inject 40 Units into the skin daily with supper. (Patient not taking: Reported on 09/18/2016) 10 mL 11    Results for orders placed or performed during the hospital encounter of 09/18/16 (from the past 48 hour(s))  Glucose, capillary     Status: Abnormal   Collection Time: 09/18/16  8:35 AM  Result Value Ref Range   Glucose-Capillary 154 (H) 65 - 99 mg/dL   No results found.  Review Of Systems Constitutional: No fever, chills, Positive weight gain. Eyes: Vision change, Wears glasses. Ears: No hearing loss, No tinnitus. Respiratory: Positive asthma, COPD, no pneumonias. Positive shortness of breath. No hemoptysis. Cardiovascular: Positive chest pain, palpitation or leg edema. Gastrointestinal: No nausea, vomiting or diarrhea or constipation. No GI bleed. No hepatitis. Genitourinary: No dysuria, hematuria or kidney stone. No incontinance. Neurological: Positive headache, no stroke or seizures.  Psychiatry: No psych facility admission for anxiety, depression or suicide. No detox. Skin: No rash. Musculoskeletal: Positive joint pain or fibromyalgia. Positive neck pain or back pain. Lymphadenopathy: No lymphadenopathy Hematology: No anemia or easy bruising.  Blood pressure (!) 147/73, height 5\' 6"  (1.676 m), weight 135.1 kg (297  lb 12.8 oz), SpO2 100 %. Body mass index is 48.07 kg/m. General appearance: alert, cooperative, appears stated age and moderately obese, mild distress. Head: Normocephalic, atraumatic Eyes: Brown eyes, Pink conjunctivae/corneas clear. PERRL, EOM's intact. Prominent eyes. Neck: No adenopathy, no carotid bruit, no JVD, supple, symmetrical, trachea midline and thyroid not enlarged, symmetric, no tenderness/mass/nodules Resp: clear to auscultation bilaterally. Chest wall: no tenderness. Cardio: regular rate and rhythm, S1, S2 normal, II/VI systolic murmur, no  click, rub or gallop GI: soft, non-tender; bowel sounds normal; no masses,  no organomegaly Extremities: No cyanosis or edema Skin: Warm and dry. No rashes or lesions Neurologic: Alert and oriented X 3, normal strength and tone. Normal coordination and gait  Assessment/Plan Chest pain CAD Asthma Obesity Hypertension Hypothyroidism DM, II  Place in observation. R/O MI. Nuclear stress test in AM.  [name], MD  09/18/2016, 1:43 PM

## 2016-09-19 ENCOUNTER — Observation Stay (HOSPITAL_COMMUNITY): Payer: Medicaid Other

## 2016-09-19 LAB — LIPID PANEL
CHOL/HDL RATIO: 4.3 ratio
CHOLESTEROL: 187 mg/dL (ref 0–200)
HDL: 43 mg/dL (ref >40)
LDL CALC: 88 mg/dL (ref 0–99)
TRIGLYCERIDES: 279 mg/dL — AB (ref <150)
VLDL: 56 mg/dL — AB (ref 0–40)

## 2016-09-19 LAB — HEPARIN LEVEL (UNFRACTIONATED)
HEPARIN UNFRACTIONATED: 0.5 [IU]/mL (ref 0.30–0.70)
Heparin Unfractionated: 0.22 IU/mL — ABNORMAL LOW (ref 0.30–0.70)
Heparin Unfractionated: 0.36 IU/mL (ref 0.30–0.70)

## 2016-09-19 LAB — CBC
HCT: 29 % — ABNORMAL LOW (ref 36.0–46.0)
Hemoglobin: 8.9 g/dL — ABNORMAL LOW (ref 12.0–15.0)
MCH: 20.3 pg — AB (ref 26.0–34.0)
MCHC: 30.7 g/dL (ref 30.0–36.0)
MCV: 66.1 fL — AB (ref 78.0–100.0)
PLATELETS: 230 10*3/uL (ref 150–400)
RBC: 4.39 MIL/uL (ref 3.87–5.11)
RDW: 19.9 % — AB (ref 11.5–15.5)
WBC: 6.4 10*3/uL (ref 4.0–10.5)

## 2016-09-19 LAB — GLUCOSE, CAPILLARY: GLUCOSE-CAPILLARY: 139 mg/dL — AB (ref 65–99)

## 2016-09-19 LAB — TROPONIN I

## 2016-09-19 MED ORDER — INSULIN ASPART 100 UNIT/ML ~~LOC~~ SOLN: 4.0000 [IU] | Freq: Three times a day (TID) | SUBCUTANEOUS | Status: DC

## 2016-09-19 MED ORDER — HEPARIN (PORCINE) IN NACL 100-0.45 UNIT/ML-% IJ SOLN
1500.0000 [IU]/h | INTRAMUSCULAR | Status: DC
  Administered 2016-09-19: 1500 [IU]/h via INTRAVENOUS
  Filled 2016-09-19: qty 250

## 2016-09-19 MED ORDER — INSULIN ASPART 100 UNIT/ML ~~LOC~~ SOLN: 0.0000 [IU] | Freq: Three times a day (TID) | SUBCUTANEOUS | Status: DC

## 2016-09-19 MED ORDER — REGADENOSON 0.4 MG/5ML IV SOLN
INTRAVENOUS | Status: AC
  Filled 2016-09-19: qty 5

## 2016-09-19 MED ORDER — INSULIN ASPART 100 UNIT/ML ~~LOC~~ SOLN
4.0000 [IU] | Freq: Three times a day (TID) | SUBCUTANEOUS | Status: DC
  Administered 2016-09-19 – 2016-09-21 (×4): 4 [IU] via SUBCUTANEOUS

## 2016-09-19 MED ORDER — HEPARIN (PORCINE) IN NACL 100-0.45 UNIT/ML-% IJ SOLN
1600.0000 [IU]/h | INTRAMUSCULAR | Status: DC
  Administered 2016-09-19 – 2016-09-20 (×2): 1600 [IU]/h via INTRAVENOUS
  Filled 2016-09-19 (×2): qty 250

## 2016-09-19 MED ORDER — REGADENOSON 0.4 MG/5ML IV SOLN
0.4000 mg | Freq: Once | INTRAVENOUS | Status: AC
  Administered 2016-09-19: 0.4 mg via INTRAVENOUS
  Filled 2016-09-19: qty 5

## 2016-09-19 MED ORDER — TECHNETIUM TC 99M TETROFOSMIN IV KIT
30.0000 | PACK | Freq: Once | INTRAVENOUS | Status: AC | PRN
  Administered 2016-09-19: 30 via INTRAVENOUS

## 2016-09-19 MED ORDER — INSULIN ASPART 100 UNIT/ML ~~LOC~~ SOLN
0.0000 [IU] | Freq: Three times a day (TID) | SUBCUTANEOUS | Status: DC
  Administered 2016-09-19: 7 [IU] via SUBCUTANEOUS
  Administered 2016-09-19 – 2016-09-20 (×2): 3 [IU] via SUBCUTANEOUS
  Administered 2016-09-21: 4 [IU] via SUBCUTANEOUS

## 2016-09-19 NOTE — Progress Notes (Signed)
[place] for Heparin Indication: chest pain/ACS  Allergies  Allergen Reactions  . Ace Inhibitors     Other reaction(s): OTHER  . Latex Rash    unknown  . Percocet [Oxycodone-Acetaminophen] Itching         Patient Measurements: Height: 5\' 6"  (167.6 cm) Weight: 297 lb 12.8 oz (135.1 kg) IBW/kg (Calculated) : 59.3 Heparin Dosing Weight: 92.4 kg  Vital Signs: Temp: 98.7 F (37.1 C) (11/14 2100) Temp Source: Oral (11/14 2100) BP: 146/80 (11/14 2100) Pulse Rate: 66 (11/15 0142)  Labs:  Recent Labs  09/18/16 1432 09/18/16 1912 09/18/16 2058 09/19/16 0221  HGB 9.3*  --   --  8.9*  HCT 30.6*  --   --  29.0*  PLT 251  --   --  230  APTT 27  --   --   --   LABPROT 12.7  --   --   --   INR 0.95  --   --   --   HEPARINUNFRC  --   --  0.36 0.22*  CREATININE 0.80  --   --   --   TROPONINI <0.03 <0.03  --   --     Estimated Creatinine Clearance: 105.8 mL/min (by C-G formula based on SCr of 0.8 mg/dL).  Assessment: 60 y.o female scheduled for multiple dental extractions with alveoloplasty on 09/21/2016. Patient complaining of chest pain. Cardiologist has consulted pharmacy to start IV heparin infusion.   Heparin level now subtherapeutic at 0.22. No issues with infusion.   Goal of Therapy:  Heparin level 0.3-0.7 units/ml Monitor platelets by anticoagulation protocol: Yes   Plan:  1. Increase IV heparin to 1500 units/hr 2. Heparin level in 6 hours   Vincenza Hews, PharmD, BCPS 09/19/2016, 3:27 AM Pager: [phone]

## 2016-09-19 NOTE — Progress Notes (Signed)
Ref: Philis Fendt, MD   Subjective:  Had chest pain with Lexiscan injection. Nuclear images are pending.  Objective:  Vital Signs in the last 24 hours: Temp:  [97.9 F (36.6 C)-98.7 F (37.1 C)] 97.9 F (36.6 C) (11/15 0455) Pulse Rate:  [65-74] 65 (11/15 0455) Cardiac Rhythm: Normal sinus rhythm (11/15 0700) Resp:  [16-18] 18 (11/15 0455) BP: (132-165)/(60-96) 141/87 (11/15 0902) SpO2:  [97 %-99 %] 98 % (11/15 0455) Weight:  [136.9 kg (301 lb 14.4 oz)] 136.9 kg (301 lb 14.4 oz) (11/15 0455)  Physical Exam: BP Readings from Last 1 Encounters:  09/19/16 (!) 141/87    Wt Readings from Last 1 Encounters:  09/19/16 (!) 136.9 kg (301 lb 14.4 oz)    Weight change:  Body mass index is 48.73 kg/m. HEENT: [place]/AT, Eyes-Brown, PERL, EOMI, Conjunctiva-Pink, Sclera-Non-icteric Neck: No JVD, No bruit, Trachea midline. Lungs:  Clear, Bilateral. Cardiac:  Regular rhythm, normal S1 and S2, no S3. II/VI systolic murmur. Abdomen:  Soft, non-tender. BS present. Extremities:  No edema present. No cyanosis. No clubbing. CNS: AxOx3, Cranial nerves grossly intact, moves all 4 extremities.  Skin: Warm and dry.   Intake/Output from previous day: 11/14 0701 - 11/15 0700 In: 413 [P.O.:240; I.V.:173] Out: -     Lab Results: BMET    Component Value Date/Time   NA 142 09/18/2016 1432   NA 141 09/13/2016 1858   NA 138 06/23/2016 0133   K 4.0 09/18/2016 1432   K 3.7 09/13/2016 1858   K 3.5 06/23/2016 0133   CL 108 09/18/2016 1432   CL 106 09/13/2016 1858   CL 106 06/23/2016 0133   CO2 28 09/18/2016 1432   CO2 28 09/13/2016 1858   CO2 27 06/23/2016 0133   GLUCOSE 118 (H) 09/18/2016 1432   GLUCOSE 133 (H) 09/13/2016 1858   GLUCOSE 158 (H) 06/23/2016 0133   BUN 8 09/18/2016 1432   BUN 8 09/13/2016 1858   BUN 13 06/23/2016 0133   CREATININE 0.80 09/18/2016 1432   CREATININE 0.73 09/13/2016 1858   CREATININE 0.89 06/23/2016 0133   CALCIUM 8.8 (L) 09/18/2016 1432   CALCIUM 9.0  09/13/2016 1858   CALCIUM 8.7 (L) 06/23/2016 0133   GFRNONAA >60 09/18/2016 1432   GFRNONAA >60 09/13/2016 1858   GFRNONAA >60 06/23/2016 0133   GFRAA >60 09/18/2016 1432   GFRAA >60 09/13/2016 1858   GFRAA >60 06/23/2016 0133   CBC    Component Value Date/Time   WBC 6.4 09/19/2016 0221   RBC 4.39 09/19/2016 0221   HGB 8.9 (L) 09/19/2016 0221   HCT 29.0 (L) 09/19/2016 0221   PLT 230 09/19/2016 0221   MCV 66.1 (L) 09/19/2016 0221   MCH 20.3 (L) 09/19/2016 0221   MCHC 30.7 09/19/2016 0221   RDW 19.9 (H) 09/19/2016 0221   LYMPHSABS 3.0 06/23/2016 0133   MONOABS 0.4 06/23/2016 0133   EOSABS 0.1 06/23/2016 0133   BASOSABS 0.0 06/23/2016 0133   HEPATIC Function Panel  Recent Labs  06/23/16 0133 09/13/16 1858 09/18/16 1432  PROT 6.6 7.5 6.7   HEMOGLOBIN A1C No components found for: HGA1C,  MPG CARDIAC ENZYMES Lab Results  Component Value Date   CKTOTAL 117 09/13/2016   CKMB 2.2 04/28/2011   TROPONINI <0.03 09/19/2016   TROPONINI <0.03 09/18/2016   TROPONINI <0.03 09/18/2016   BNP No results for input(s): PROBNP in the last 8760 hours. TSH  Recent Labs  09/13/16 2339  TSH 1.124   CHOLESTEROL  Recent Labs  09/19/16 0221  CHOL 187    Scheduled Meds: . amLODipine  10 mg Oral Daily  . amoxicillin-clavulanate  1 tablet Oral BID  . aspirin EC  81 mg Oral Daily  . citalopram  10 mg Oral Daily  . colchicine  0.6 mg Oral Daily  . cycloSPORINE  1 drop Both Eyes BID  . dicyclomine  10 mg Oral TID AC  . fenofibrate  160 mg Oral Daily  . ferrous sulfate  325 mg Oral Q breakfast  . gabapentin  300 mg Oral QHS  . insulin aspart  0-20 Units Subcutaneous TID WC  . insulin aspart  4 Units Subcutaneous TID WC  . metFORMIN  500 mg Oral BID WC  . methocarbamol  500 mg Oral BID  . metoprolol  50 mg Oral BID  . polyethylene glycol  17 g Oral Daily  . potassium chloride  10 mEq Oral TID  . regadenoson      . simvastatin  20 mg Oral QHS  . sodium chloride flush  3 mL  Intravenous Q12H  . tobramycin  1 application Both Eyes QID  . Travoprost (BAK Free)  1 drop Both Eyes QHS   Continuous Infusions: . heparin 1,600 Units/hr (09/19/16 1315)   PRN Meds:.sodium chloride, albuterol, ALPRAZolam, alum & mag hydroxide-simeth, diclofenac sodium, docusate sodium, HYDROcodone-acetaminophen, ipratropium, nitroGLYCERIN, sodium chloride flush  Assessment/Plan: Chest pain CAD Asthma Obesity Hypertension Hypothyroidism DM, II.  Continue medical treatment.   LOS: 0 days    Dixie Dials  MD  09/19/2016, 1:53 PM

## 2016-09-19 NOTE — Progress Notes (Signed)
[facility] for Heparin Indication: chest pain/ACS  Allergies  Allergen Reactions  . Ace Inhibitors     Other reaction(s): OTHER  . Latex Rash    unknown  . Percocet [Oxycodone-Acetaminophen] Itching         Patient Measurements: Height: 5\' 6"  (167.6 cm) Weight: (!) 301 lb 14.4 oz (136.9 kg) IBW/kg (Calculated) : 59.3 Heparin Dosing Weight: 92.4 kg  Vital Signs: Temp: 98.4 F (36.9 C) (11/15 1503) Temp Source: Oral (11/15 1503) BP: 145/72 (11/15 1503) Pulse Rate: 67 (11/15 1503)  Labs:  Recent Labs  09/18/16 1432 09/18/16 1912  09/19/16 0221 09/19/16 1044 09/19/16 2014  HGB 9.3*  --   --  8.9*  --   --   HCT 30.6*  --   --  29.0*  --   --   PLT 251  --   --  230  --   --   APTT 27  --   --   --   --   --   LABPROT 12.7  --   --   --   --   --   INR 0.95  --   --   --   --   --   HEPARINUNFRC  --   --   < > 0.22* 0.36 0.50  CREATININE 0.80  --   --   --   --   --   TROPONINI <0.03 <0.03  --  <0.03  --   --   < > = values in this interval not displayed.  Estimated Creatinine Clearance: 106.6 mL/min (by C-G formula based on SCr of 0.8 mg/dL).  Assessment: 60 y.o female scheduled for multiple dental extractions with alveoloplasty on 09/21/2016. Patient admitted 09/18/16 due to chest pain and started on IV heparin per pharmacy consult.   Heparin level is therapeutic at 0.50 on 1600 units/hr.  No bleeding noted.  No change to therapy as two therapeutic levels.    Goal of Therapy:  Heparin level 0.3-0.7 units/ml Monitor platelets by anticoagulation protocol: Yes   Plan:  Continue IV heparin infusion at 1600 units/hr  Daily HL, CBC  Sloan Leiter, PharmD, BCPS Clinical Pharmacist [phone] until 11 PM [phone] after hours 09/19/2016, 9:04 PM

## 2016-09-19 NOTE — Progress Notes (Signed)
[facility] for Heparin Indication: chest pain/ACS  Allergies  Allergen Reactions  . Ace Inhibitors     Other reaction(s): OTHER  . Latex Rash    unknown  . Percocet [Oxycodone-Acetaminophen] Itching         Patient Measurements: Height: 5\' 6"  (167.6 cm) Weight: (!) 301 lb 14.4 oz (136.9 kg) IBW/kg (Calculated) : 59.3 Heparin Dosing Weight: 92.4 kg  Vital Signs: Temp: 97.9 F (36.6 C) (11/15 0455) Temp Source: Axillary (11/15 0455) BP: 141/87 (11/15 0902) Pulse Rate: 65 (11/15 0455)  Labs:  Recent Labs  09/18/16 1432 09/18/16 1912 09/18/16 2058 09/19/16 0221 09/19/16 1044  HGB 9.3*  --   --  8.9*  --   HCT 30.6*  --   --  29.0*  --   PLT 251  --   --  230  --   APTT 27  --   --   --   --   LABPROT 12.7  --   --   --   --   INR 0.95  --   --   --   --   HEPARINUNFRC  --   --  0.36 0.22* 0.36  CREATININE 0.80  --   --   --   --   TROPONINI <0.03 <0.03  --  <0.03  --     Estimated Creatinine Clearance: 106.6 mL/min (by C-G formula based on SCr of 0.8 mg/dL).  Assessment: 60 y.o female scheduled for multiple dental extractions with alveoloplasty on 09/21/2016. Patient admitted 09/18/16 due to chest pain. Cardiologist consulted pharmacy to start IV heparin infusion.  Troponin <0.03 x 3.  Hgb 8.9, Hct 29, H/H low on admit/stable from admit yesterday. Pltc 230 wnl/stable. No bleeding noted. H/o anemia- on oral iron supplement PTA/continued inpatient.  Heparin level now therapeutic at 0.36 after heparin rate increased to 1500 units/hr.    Goal of Therapy:  Heparin level 0.3-0.7 units/ml Monitor platelets by anticoagulation protocol: Yes   Plan:  Increase Heparin IV rate to 1600 units/hr (1unit/kg/hr increase to ensure HL remains within goal) 6 hour HL Daily HL, CBC  Nicole Cella, RPh Clinical Pharmacist [phone] ([phone]) Main pharmacy [phone] 09/19/2016, 12:54 PM

## 2016-09-20 ENCOUNTER — Encounter (HOSPITAL_COMMUNITY)
Admission: AD | Disposition: A | Discharge status: Home / Self Care | Payer: Self-pay | Source: Ambulatory Visit | Attending: Cardiovascular Disease

## 2016-09-20 DIAGNOSIS — Z794 Long term (current) use of insulin: Secondary | ICD-10-CM | POA: Diagnosis not present

## 2016-09-20 DIAGNOSIS — F329 Major depressive disorder, single episode, unspecified: Secondary | ICD-10-CM | POA: Diagnosis not present

## 2016-09-20 DIAGNOSIS — G4733 Obstructive sleep apnea (adult) (pediatric): Secondary | ICD-10-CM | POA: Diagnosis not present

## 2016-09-20 DIAGNOSIS — J45909 Unspecified asthma, uncomplicated: Secondary | ICD-10-CM | POA: Diagnosis not present

## 2016-09-20 DIAGNOSIS — D573 Sickle-cell trait: Secondary | ICD-10-CM | POA: Diagnosis present

## 2016-09-20 DIAGNOSIS — K219 Gastro-esophageal reflux disease without esophagitis: Secondary | ICD-10-CM | POA: Diagnosis present

## 2016-09-20 DIAGNOSIS — D509 Iron deficiency anemia, unspecified: Secondary | ICD-10-CM | POA: Diagnosis not present

## 2016-09-20 DIAGNOSIS — Z79899 Other long term (current) drug therapy: Secondary | ICD-10-CM | POA: Diagnosis not present

## 2016-09-20 DIAGNOSIS — Z7982 Long term (current) use of aspirin: Secondary | ICD-10-CM | POA: Diagnosis not present

## 2016-09-20 DIAGNOSIS — Z955 Presence of coronary angioplasty implant and graft: Secondary | ICD-10-CM | POA: Diagnosis not present

## 2016-09-20 DIAGNOSIS — E669 Obesity, unspecified: Secondary | ICD-10-CM | POA: Diagnosis not present

## 2016-09-20 DIAGNOSIS — R079 Chest pain, unspecified: Secondary | ICD-10-CM | POA: Diagnosis present

## 2016-09-20 DIAGNOSIS — Z6841 Body Mass Index (BMI) 40.0 and over, adult: Secondary | ICD-10-CM | POA: Diagnosis not present

## 2016-09-20 DIAGNOSIS — I110 Hypertensive heart disease with heart failure: Secondary | ICD-10-CM | POA: Diagnosis not present

## 2016-09-20 DIAGNOSIS — R072 Precordial pain: Secondary | ICD-10-CM | POA: Diagnosis not present

## 2016-09-20 DIAGNOSIS — I509 Heart failure, unspecified: Secondary | ICD-10-CM | POA: Diagnosis not present

## 2016-09-20 DIAGNOSIS — E785 Hyperlipidemia, unspecified: Secondary | ICD-10-CM | POA: Diagnosis present

## 2016-09-20 DIAGNOSIS — M109 Gout, unspecified: Secondary | ICD-10-CM | POA: Diagnosis present

## 2016-09-20 DIAGNOSIS — F419 Anxiety disorder, unspecified: Secondary | ICD-10-CM | POA: Diagnosis present

## 2016-09-20 DIAGNOSIS — E039 Hypothyroidism, unspecified: Secondary | ICD-10-CM | POA: Diagnosis not present

## 2016-09-20 DIAGNOSIS — E781 Pure hyperglyceridemia: Secondary | ICD-10-CM | POA: Diagnosis present

## 2016-09-20 DIAGNOSIS — E119 Type 2 diabetes mellitus without complications: Secondary | ICD-10-CM | POA: Diagnosis not present

## 2016-09-20 DIAGNOSIS — I2510 Atherosclerotic heart disease of native coronary artery without angina pectoris: Secondary | ICD-10-CM | POA: Diagnosis not present

## 2016-09-20 DIAGNOSIS — I252 Old myocardial infarction: Secondary | ICD-10-CM | POA: Diagnosis not present

## 2016-09-20 HISTORY — PX: CARDIAC CATHETERIZATION: SHX172

## 2016-09-20 LAB — CBC
HCT: 29.5 % — ABNORMAL LOW (ref 36.0–46.0)
HEMOGLOBIN: 9 g/dL — AB (ref 12.0–15.0)
MCH: 20 pg — AB (ref 26.0–34.0)
MCHC: 30.5 g/dL (ref 30.0–36.0)
MCV: 65.4 fL — AB (ref 78.0–100.0)
Platelets: 235 10*3/uL (ref 150–400)
RBC: 4.51 MIL/uL (ref 3.87–5.11)
RDW: 20.1 % — ABNORMAL HIGH (ref 11.5–15.5)
WBC: 7 10*3/uL (ref 4.0–10.5)

## 2016-09-20 LAB — POCT ACTIVATED CLOTTING TIME: ACTIVATED CLOTTING TIME: 142 s

## 2016-09-20 LAB — GLUCOSE, CAPILLARY
GLUCOSE-CAPILLARY: 136 mg/dL — AB (ref 65–99)
GLUCOSE-CAPILLARY: 182 mg/dL — AB (ref 65–99)
GLUCOSE-CAPILLARY: 146 mg/dL — AB (ref 65–99)
GLUCOSE-CAPILLARY: 180 mg/dL — AB (ref 65–99)
Glucose-Capillary: 204 mg/dL — ABNORMAL HIGH (ref 65–99)
Glucose-Capillary: 187 mg/dL — ABNORMAL HIGH (ref 65–99)
Glucose-Capillary: 172 mg/dL — ABNORMAL HIGH (ref 65–99)

## 2016-09-20 LAB — HEPARIN LEVEL (UNFRACTIONATED): Heparin Unfractionated: 0.55 IU/mL (ref 0.30–0.70)

## 2016-09-20 SURGERY — LEFT HEART CATH AND CORONARY ANGIOGRAPHY

## 2016-09-20 MED ORDER — HYDRALAZINE HCL 20 MG/ML IJ SOLN
INTRAMUSCULAR | Status: DC | PRN
  Administered 2016-09-20: 10 mg via INTRAVENOUS

## 2016-09-20 MED ORDER — HYDRALAZINE HCL 20 MG/ML IJ SOLN
INTRAMUSCULAR | Status: AC
Start: 2016-09-20 — End: 2016-09-20
  Filled 2016-09-20: qty 1

## 2016-09-20 MED ORDER — HEPARIN (PORCINE) IN NACL 2-0.9 UNIT/ML-% IJ SOLN
INTRAMUSCULAR | Status: DC | PRN
  Administered 2016-09-20: 1500 mL

## 2016-09-20 MED ORDER — SODIUM CHLORIDE 0.9% FLUSH: 3.0000 mL | INTRAVENOUS | Status: DC | PRN

## 2016-09-20 MED ORDER — SODIUM CHLORIDE 0.9 % IV SOLN: 250.0000 mL | INTRAVENOUS | Status: DC | PRN

## 2016-09-20 MED ORDER — SODIUM CHLORIDE 0.9 % IV SOLN: INTRAVENOUS | Status: AC

## 2016-09-20 MED ORDER — SODIUM CHLORIDE 0.9 % IV SOLN
INTRAVENOUS | Status: DC
  Administered 2016-09-20: 13:00:00 via INTRAVENOUS

## 2016-09-20 MED ORDER — LIDOCAINE HCL (PF) 1 % IJ SOLN
INTRAMUSCULAR | Status: DC | PRN
  Administered 2016-09-20: 30 mL via INTRADERMAL

## 2016-09-20 MED ORDER — SODIUM CHLORIDE 0.9% FLUSH: 3.0000 mL | Freq: Two times a day (BID) | INTRAVENOUS | Status: DC

## 2016-09-20 MED ORDER — SODIUM CHLORIDE 0.9 % IV SOLN
250.0000 mL | INTRAVENOUS | Status: DC | PRN
Start: 2016-09-20 — End: 2016-09-20

## 2016-09-20 MED ORDER — FENTANYL CITRATE (PF) 100 MCG/2ML IJ SOLN
INTRAMUSCULAR | Status: AC
  Filled 2016-09-20: qty 2

## 2016-09-20 MED ORDER — MIDAZOLAM HCL 2 MG/2ML IJ SOLN
INTRAMUSCULAR | Status: AC
  Filled 2016-09-20: qty 2

## 2016-09-20 MED ORDER — TECHNETIUM TC 99M TETROFOSMIN IV KIT
30.0000 | PACK | Freq: Once | INTRAVENOUS | Status: AC | PRN
  Administered 2016-09-20: 30 via INTRAVENOUS

## 2016-09-20 MED ORDER — MIDAZOLAM HCL 2 MG/2ML IJ SOLN
INTRAMUSCULAR | Status: DC | PRN
  Administered 2016-09-20: 1 mg via INTRAVENOUS

## 2016-09-20 MED ORDER — LIDOCAINE HCL (PF) 1 % IJ SOLN
INTRAMUSCULAR | Status: AC
  Filled 2016-09-20: qty 30

## 2016-09-20 MED ORDER — FENTANYL CITRATE (PF) 100 MCG/2ML IJ SOLN
INTRAMUSCULAR | Status: DC | PRN
  Administered 2016-09-20: 25 ug via INTRAVENOUS

## 2016-09-20 MED ORDER — HEPARIN (PORCINE) IN NACL 2-0.9 UNIT/ML-% IJ SOLN
INTRAMUSCULAR | Status: AC
  Filled 2016-09-20: qty 1500

## 2016-09-20 SURGICAL SUPPLY — 8 items
CATH INFINITI 5FR MULTPACK ANG (CATHETERS) ×1 IMPLANT
GUIDEWIRE 3MM J TIP .035 145 (WIRE) ×1 IMPLANT
HOVERMATT SINGLE USE (MISCELLANEOUS) ×1 IMPLANT
KIT HEART LEFT (KITS) ×2 IMPLANT
PACK CARDIAC CATHETERIZATION (CUSTOM PROCEDURE TRAY) ×2 IMPLANT
SHEATH PINNACLE 5F 10CM (SHEATH) ×1 IMPLANT
SYR MEDRAD MARK V 150ML (SYRINGE) ×2 IMPLANT
TRANSDUCER W/STOPCOCK (MISCELLANEOUS) ×2 IMPLANT

## 2016-09-20 NOTE — Progress Notes (Signed)
Back to room from Cath Lab via pt bed. Rt groin site with guaze drsg covered with tegaderm CD and I with no s/s of bleed or complications at site. Post cath instructions and restrictions discussed with pt and family. ie HOB flat with Rt leg straight x 4 hours. Call prn bleed,hematoma,  pain, numbness or tingling. Call bell in reach. Instructed to call prn needs or other. Verbalized understanding. Family into room.

## 2016-09-20 NOTE — Progress Notes (Signed)
[facility] for Heparin Indication: chest pain/ACS  Allergies  Allergen Reactions  . Ace Inhibitors     Other reaction(s): OTHER  . Latex Rash    unknown  . Percocet [Oxycodone-Acetaminophen] Itching         Patient Measurements: Height: 5\' 6"  (167.6 cm) Weight: 293 lb 4.8 oz (133 kg) IBW/kg (Calculated) : 59.3 Heparin Dosing Weight: 92.4 kg  Vital Signs: Temp: 97.7 F (36.5 C) (11/16 1418) Temp Source: Oral (11/16 1418) BP: 120/71 (11/16 1538) Pulse Rate: 62 (11/16 1538)  Labs:  Recent Labs  09/18/16 1432 09/18/16 1912  09/19/16 0221 09/19/16 1044 09/19/16 2014 09/20/16 0405  HGB 9.3*  --   --  8.9*  --   --  9.0*  HCT 30.6*  --   --  29.0*  --   --  29.5*  PLT 251  --   --  230  --   --  235  APTT 27  --   --   --   --   --   --   LABPROT 12.7  --   --   --   --   --   --   INR 0.95  --   --   --   --   --   --   HEPARINUNFRC  --   --   < > 0.22* 0.36 0.50 0.55  CREATININE 0.80  --   --   --   --   --   --   TROPONINI <0.03 <0.03  --  <0.03  --   --   --   < > = values in this interval not displayed.  Estimated Creatinine Clearance: 104.8 mL/min (by C-G formula based on SCr of 0.8 mg/dL).  Assessment: 60 y.o female scheduled for multiple dental extractions with alveoloplasty on 09/21/2016. Patient admitted 09/18/16 due to chest pain and started on IV heparin per pharmacy consult.   Heparin level remains therapeutic at 0.55 this AM on 1600 units/hr.  No bleeding noted.  Now s/p cardiac cath today: coronaries are angiographically normal, LV systolic function is normal, EF 50-55%   Goal of Therapy:  Heparin level 0.3-0.7 units/ml Monitor platelets by anticoagulation protocol: Yes   Plan:  Spoke to Dr. Stana Bunting said to discontinue the IV heparin . Daily HL, CBC  Nicole Cella, RPh Clinical Pharmacist [phone] or [phone]) Main Rx [phone] 09/20/2016, 4:50 PM

## 2016-09-20 NOTE — Interval H&P Note (Signed)
History and Physical Interval Note:  09/20/2016 2:43 PM  Chelsea Lester  has presented today for surgery, with the diagnosis of cp  The various methods of treatment have been discussed with the patient and family. After consideration of risks, benefits and other options for treatment, the patient has consented to  Procedure(s): Left Heart Cath and Coronary Angiography (N/A) as a surgical intervention .  The patient's history has been reviewed, patient examined, no change in status, stable for surgery.  I have reviewed the patient's chart and labs.  Questions were answered to the patient's satisfaction.     Nickisha Hum S

## 2016-09-20 NOTE — Progress Notes (Signed)
Placed patient on CPAP for the night.  

## 2016-09-21 ENCOUNTER — Ambulatory Visit (HOSPITAL_COMMUNITY): Admission: RE | Admit: 2016-09-21 | Payer: Medicaid Other | Source: Ambulatory Visit | Admitting: Oral Surgery

## 2016-09-21 ENCOUNTER — Encounter (HOSPITAL_COMMUNITY): Payer: Self-pay | Admitting: Cardiovascular Disease

## 2016-09-21 ENCOUNTER — Encounter (HOSPITAL_COMMUNITY): Admission: RE | Payer: Self-pay | Source: Ambulatory Visit

## 2016-09-21 LAB — GLUCOSE, CAPILLARY: GLUCOSE-CAPILLARY: 187 mg/dL — AB (ref 65–99)

## 2016-09-21 LAB — CBC
HCT: 30.6 % — ABNORMAL LOW (ref 36.0–46.0)
HEMOGLOBIN: 9.4 g/dL — AB (ref 12.0–15.0)
MCH: 20 pg — ABNORMAL LOW (ref 26.0–34.0)
MCHC: 30.7 g/dL (ref 30.0–36.0)
MCV: 65.1 fL — ABNORMAL LOW (ref 78.0–100.0)
PLATELETS: 257 10*3/uL (ref 150–400)
RBC: 4.7 MIL/uL (ref 3.87–5.11)
RDW: 19.8 % — ABNORMAL HIGH (ref 11.5–15.5)
WBC: 7.1 10*3/uL (ref 4.0–10.5)

## 2016-09-21 SURGERY — MULTIPLE EXTRACTION WITH ALVEOLOPLASTY
Anesthesia: General | Laterality: Bilateral

## 2016-09-21 NOTE — Discharge Summary (Signed)
Physician Discharge Summary  Patient ID: Chelsea Lester MRN: [id number] DOB/AGE: 01-30-1956 60 y.o.  Admit date: 09/18/2016 Discharge date: 09/21/2016  Admission Diagnoses: Chest pain CAD Asthma Obesity Hypertension Hypothyroidism DM, II  Discharge Diagnoses:  Principal Problem:   Chest pain, precordial Active Problems:   Obesity   Hypertension   Asthma   Hypothyroidism   DM, II   Obstructive sleep apnea   Chronic iron deficiency anemia   Hypertriglyceridemia  Discharged Condition: fair  Hospital Course: 60 year old female with recurrent chest pain underwent nuclear stress test followed by cardiac catheterization showing normal coronaries. She will continue her medications and effort to lose weight with increased activity and low fat, low salt diet. She will see me and primary care in 1 week. She will also call her dentist for teeth extraction.  Consults: cardiology  Significant Diagnostic Studies: labs: Normal WBC and Platelets count. Chronic low Hgb of 9.3. Normal BMET with mildly elevated glucose of 118 mg. Troponin-I negative x 3.   Nuclear stress test: Small defect in apical lateral location. EF 48 %.  EKG: Normal.  Cardiac catheterization: Normal coronaries. EF 50-55 %.  Treatments: cardiac meds: amlodipine, metoprolol, simvastatin and aspirin.  Discharge Exam: Blood pressure (!) 155/73, pulse 78, temperature 98.6 F (37 C), temperature source Oral, resp. rate 18, height 5\' 6"  (1.676 m), weight 131.5 kg (290 lb), SpO2 100 %. General appearance: alert, cooperative, appears stated age, no distress and moderately obese Head: Normocephalic, atraumatic Eyes: Conjunctivae/corneas clear. PERRL, EOM's intact.  Neck: No adenopathy, no carotid bruit, no JVD, supple, symmetrical, trachea midline and thyroid not enlarged. Resp: Clear to auscultation bilaterally Cardio: regular rate and rhythm, S1, S2 normal, II/VI systolic murmur, no click, rub or gallop. GI: soft,  non-tender; bowel sounds normal; no masses,  no organomegaly Extremities: No cyanosis or edema Skin: Warm and dry. No rashes or lesions Neurologic: Alert and oriented X 3, normal strength and tone. Normal coordination and gait.  Disposition: 01-Home or Self Care     Medication List    STOP taking these medications   hyoscyamine 0.375 MG 12 hr tablet Commonly known as:  LEVBID     TAKE these medications   albuterol 90 MCG/ACT inhaler Commonly known as:  PROVENTIL,VENTOLIN Inhale 2 puffs into the lungs every 6 (six) hours as needed for shortness of breath. Wheezing or shortness of breath   ALPRAZolam 0.5 MG tablet Commonly known as:  XANAX Take 1 tablet by mouth at bedtime as needed for anxiety or sleep.   amLODipine 10 MG tablet Commonly known as:  NORVASC Take 10 mg by mouth daily.   amoxicillin-clavulanate 875-125 MG tablet Commonly known as:  AUGMENTIN Take 1 tablet by mouth 2 (two) times daily. For 10 days   aspirin 81 MG tablet Take 81 mg by mouth daily.   citalopram 10 MG tablet Commonly known as:  CELEXA Take 10 mg by mouth daily.   colchicine 0.6 MG tablet Take 0.6 mg by mouth daily.   diclofenac sodium 1 % Gel Commonly known as:  VOLTAREN Apply 2 g topically 4 (four) times daily as needed (pain).   dicyclomine 10 MG capsule Commonly known as:  BENTYL Take 1 tab 3 times a day before meals.   docusate sodium 100 MG capsule Commonly known as:  COLACE Take 1 capsule (100 mg total) by mouth 2 (two) times daily as needed for mild constipation.   ergocalciferol 50000 units capsule Commonly known as:  VITAMIN D2 Take 1 capsule (  50,000 Units total) by mouth once a week. Usually takes on Saturday or fridays   famotidine 20 MG tablet Commonly known as:  PEPCID Take 20 mg by mouth daily.   fenofibrate 145 MG tablet Commonly known as:  TRICOR Take 1 tablet (145 mg total) by mouth daily.   ferrous sulfate 325 (65 FE) MG tablet Take 1 tablet (325 mg total)  by mouth daily with breakfast.   fluticasone 50 MCG/ACT nasal spray Commonly known as:  FLONASE Place 1 spray into both nostrils 2 (two) times daily.   fluticasone-salmeterol 115-21 MCG/ACT inhaler Commonly known as:  ADVAIR HFA Inhale 2 puffs into the lungs 2 (two) times daily.   gabapentin 300 MG capsule Commonly known as:  NEURONTIN Take 1 capsule (300 mg total) by mouth at bedtime.   HYDROcodone-acetaminophen 5-500 MG tablet Commonly known as:  VICODIN Take 1 tablet by mouth every 6 (six) hours as needed. What changed:  reasons to take this   insulin NPH-regular Human (70-30) 100 UNIT/ML injection Commonly known as:  NOVOLIN 70/30 Inject 60 Units into the skin daily with breakfast. What changed:  when to take this   Insulin Syringes (Disposable) U-100 1 ML Misc by Does not apply route.   ipratropium 0.02 % nebulizer solution Commonly known as:  ATROVENT Take 2.5 mLs (0.5 mg total) by nebulization 3 (three) times daily as needed for wheezing or shortness of breath.   Lancets Misc by Does not apply route.   loratadine 10 MG tablet Commonly known as:  CLARITIN Take 10 mg by mouth daily.   metFORMIN 500 MG tablet Commonly known as:  GLUCOPHAGE Take 2 tablets (1,000 mg total) by mouth 2 (two) times daily with a meal. What changed:  how much to take   methocarbamol 500 MG tablet Commonly known as:  ROBAXIN Take 1 tablet (500 mg total) by mouth 2 (two) times daily.   metoprolol 50 MG tablet Commonly known as:  LOPRESSOR Take 1 tablet (50 mg total) by mouth 2 (two) times daily.   nitroGLYCERIN 0.4 MG SL tablet Commonly known as:  NITROSTAT Place 0.4 mg under the tongue every 5 (five) minutes as needed for chest pain.   NON FORMULARY [employer] test strip( Glucose Blood)   ondansetron 4 MG disintegrating tablet Commonly known as:  ZOFRAN ODT 4mg  ODT q4 hours prn nausea/vomit What changed:  how much to take  how to take this  when to take this  reasons to  take this  additional instructions   pantoprazole 40 MG tablet Commonly known as:  PROTONIX Take 1 tablet (40 mg total) by mouth daily. 30 mins prior to breakfast.   polyethylene glycol packet Commonly known as:  MIRALAX / GLYCOLAX Take 17 g by mouth daily.   potassium chloride 10 MEQ tablet Commonly known as:  K-DUR Take 1 tablet (10 mEq total) by mouth 3 (three) times daily.   promethazine 25 MG tablet Commonly known as:  PHENERGAN Take 0.5 tablets (12.5 mg total) by mouth every 6 (six) hours as needed for nausea or vomiting. What changed:  how much to take   RESTASIS 0.05 % ophthalmic emulsion Generic drug:  cycloSPORINE Place 1 drop into both eyes 2 (two) times daily.   simvastatin 40 MG tablet Commonly known as:  ZOCOR Take 0.5 tablets (20 mg total) by mouth at bedtime.   tobramycin 0.3 % ophthalmic ointment Commonly known as:  TOBREX Place 1 application into both eyes 4 (four) times daily.   travoprost (benzalkonium)  0.004 % ophthalmic solution Commonly known as:  TRAVATAN Place 1 drop into both eyes at bedtime.      Follow-up Information    Philis Fendt, MD. Schedule an appointment as soon as possible for a visit in 1 week(s).   Specialty:  Internal Medicine Contact information: [place] [address] [phone]        [name], MD. Schedule an appointment as soon as possible for a visit in 1 week(s).   Specialty:  Cardiology Contact information: [place] [address] [phone]           Signed: [name] 09/21/2016, 9:44 AM

## 2016-10-17 NOTE — H&P (Signed)
HISTORY AND PHYSICAL  Chelsea Lester is a 60 y.o. female patient with CC: Painful teeth  No diagnosis found.  Past Medical History:  Diagnosis Date  . Abnormal findings-skull or head    "/Dr. 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 Anginal pain ([place])    LAST CP TODAY  . Anxiety   . Asthma   . Blood dyscrasia   . CAD (coronary artery disease)   . Cataract   . CHF (congestive heart failure) ([place])   . Chronic bronchitis ([place])    "get it q year"  . Chronic lower back pain   . Colon polyp   . Complication of anesthesia    "couldn't get me woke when they put m3e to sleep for my teeth"  . Depression 11/07   Hospitalization required  . Diverticulosis   . Dyspnea    W/ EXERTION      . Esophageal stricture   . Fibromyalgia   . GERD (gastroesophageal reflux disease)   . Gout   . Headache    "weekly" (08/26/2015)     LITTLE BETTER  09/18/16  . Hemorrhoids   . History of hiatal hernia   . Hyperlipidemia   . Hypertension   . IBS (irritable bowel syndrome)   . Lupus   . Microcytosis   . Myocardial infarction    "slight one years ago"  . Obesity   . OSA on CPAP    CPAP AT NIGHT W/ O2 PRN  . Osteoarthritis    "all over"  . Pneumonia "several times"  . Seizures ([place])   . Sickle-cell trait ([place])   . Sleep disorder   . Thyroid disease    "? high or low" (08/26/2015)  . Type II diabetes mellitus ([place])     No current facility-administered medications for this encounter.    Current Outpatient Prescriptions  Medication Sig Dispense Refill  . albuterol (PROVENTIL,VENTOLIN) 90 MCG/ACT inhaler Inhale 2 puffs into the lungs every 6 (six) hours as needed for shortness of breath. Wheezing or shortness of breath    . ALPRAZolam (XANAX) 0.5 MG tablet Take 1 tablet by mouth at bedtime as needed for anxiety or sleep.   0  . amLODipine (NORVASC) 10 MG tablet Take 10 mg by mouth daily.  0  . amoxicillin-clavulanate (AUGMENTIN) 875-125 MG tablet  Take 1 tablet by mouth 2 (two) times daily. For 10 days    . aspirin 81 MG tablet Take 81 mg by mouth daily.      . citalopram (CELEXA) 10 MG tablet Take 10 mg by mouth daily.   0  . colchicine 0.6 MG tablet Take 0.6 mg by mouth daily.   0  . diclofenac sodium (VOLTAREN) 1 % GEL Apply 2 g topically 4 (four) times daily as needed (pain).     [place] Kitchen dicyclomine (BENTYL) 10 MG capsule Take 1 tab 3 times a day before meals. 90 capsule 1  . docusate sodium (COLACE) 100 MG capsule Take 1 capsule (100 mg total) by mouth 2 (two) times daily as needed for mild constipation. 60 capsule 0  . ergocalciferol (VITAMIN D2) 50000 UNITS capsule Take 1 capsule (50,000 Units total) by mouth once a week. Usually takes on Saturday or fridays 30 capsule 0  . famotidine (PEPCID) 20 MG tablet Take 20 mg by mouth daily.    . fenofibrate (TRICOR) 145 MG tablet Take 1 tablet (145 mg total) by mouth daily. 30 tablet 0  .  ferrous sulfate 325 (65 FE) MG tablet Take 1 tablet (325 mg total) by mouth daily with breakfast. 90 tablet 3  . fluticasone (FLONASE) 50 MCG/ACT nasal spray Place 1 spray into both nostrils 2 (two) times daily. 1 g 2  . fluticasone-salmeterol (ADVAIR HFA) 115-21 MCG/ACT inhaler Inhale 2 puffs into the lungs 2 (two) times daily. 1 Inhaler 12  . gabapentin (NEURONTIN) 300 MG capsule Take 1 capsule (300 mg total) by mouth at bedtime. 30 capsule 2  . HYDROcodone-acetaminophen (VICODIN) 5-500 MG tablet Take 1 tablet by mouth every 6 (six) hours as needed. (Patient taking differently: Take 1 tablet by mouth every 6 (six) hours as needed for pain. ) 10 tablet 0  . insulin NPH-regular Human (NOVOLIN 70/30) (70-30) 100 UNIT/ML injection Inject 60 Units into the skin daily with breakfast. (Patient taking differently: Inject 60 Units into the skin 2 (two) times daily. ) 10 mL 11  . ipratropium (ATROVENT) 0.02 % nebulizer solution Take 2.5 mLs (0.5 mg total) by nebulization 3 (three) times daily as needed for wheezing or  shortness of breath. 75 mL 12  . Lancets MISC by Does not apply route.      . loratadine (CLARITIN) 10 MG tablet Take 10 mg by mouth daily.     . metFORMIN (GLUCOPHAGE) 500 MG tablet Take 2 tablets (1,000 mg total) by mouth 2 (two) times daily with a meal. (Patient taking differently: Take 500 mg by mouth 2 (two) times daily with a meal. ) 60 tablet 3  . methocarbamol (ROBAXIN) 500 MG tablet Take 1 tablet (500 mg total) by mouth 2 (two) times daily. 20 tablet 0  . metoprolol (LOPRESSOR) 50 MG tablet Take 1 tablet (50 mg total) by mouth 2 (two) times daily. 30 tablet 0  . nitroGLYCERIN (NITROSTAT) 0.4 MG SL tablet Place 0.4 mg under the tongue every 5 (five) minutes as needed for chest pain.   0  . ondansetron (ZOFRAN ODT) 4 MG disintegrating tablet 4mg  ODT q4 hours prn nausea/vomit (Patient taking differently: Take 4 mg by mouth every 8 (eight) hours as needed for nausea or vomiting. 4mg  ODT q4 hours prn nausea/vomit) 20 tablet 0  . pantoprazole (PROTONIX) 40 MG tablet Take 1 tablet (40 mg total) by mouth daily. 30 mins prior to breakfast. 30 tablet 6  . polyethylene glycol (MIRALAX / GLYCOLAX) packet Take 17 g by mouth daily. 14 each 0  . potassium chloride (K-DUR) 10 MEQ tablet Take 1 tablet (10 mEq total) by mouth 3 (three) times daily. 45 tablet 0  . promethazine (PHENERGAN) 25 MG tablet Take 0.5 tablets (12.5 mg total) by mouth every 6 (six) hours as needed for nausea or vomiting. (Patient taking differently: Take 25 mg by mouth every 6 (six) hours as needed for nausea or vomiting. ) 30 tablet 0  . RESTASIS 0.05 % ophthalmic emulsion Place 1 drop into both eyes 2 (two) times daily.   0  . simvastatin (ZOCOR) 40 MG tablet Take 0.5 tablets (20 mg total) by mouth at bedtime. 30 tablet 0  . tobramycin (TOBREX) 0.3 % ophthalmic ointment Place 1 application into both eyes 4 (four) times daily.     . travoprost, benzalkonium, (TRAVATAN) 0.004 % ophthalmic solution Place 1 drop into both eyes at bedtime.  2.5 mL 12   Allergies  Allergen Reactions  . Ace Inhibitors     Other reaction(s): OTHER  . Latex Rash    unknown  . Percocet [Oxycodone-Acetaminophen] Itching  Active Problems:   * No active hospital problems. *  Vitals: There were no vitals taken for this visit. Lab results:No results found for this or any previous visit (from the past 75 hour(s)). Radiology Results: No results found. General appearance: alert, cooperative and morbidly obese Head: Normocephalic, without obvious abnormality, atraumatic Eyes: negative Nose: Nares normal. Septum midline. Mucosa normal. No drainage or sinus tenderness. Throat: dental caries teeth 6, 7, 8, 11, 12, 28, edematous gingiva/bone loss teeth 23, 24, 25, 26; pharynx clear Neck: no adenopathy, supple, symmetrical, trachea midline and thyroid not enlarged, symmetric, no tenderness/mass/nodules Resp: clear to auscultation bilaterally Cardio: regular rate and rhythm, S1, S2 normal, no murmur, click, rub or gallop  Radiograph: Panorex radiograph demonstrates 8 cm x 3 cm cotol wool radiopaque lesion left mandible; radiolucency apical to teeth 25, 26  Assessment: Multiple nonrestorable teeth, bone lesion left mandible, periapical cyst anterior mandible  Plan: Mutliple dental extractions with alveoloplasty. Biopsy bone left mandible, removal lesion anterior mandible. GA. Day surgery.   Gae Bon 10/17/2016

## 2016-10-18 ENCOUNTER — Encounter (HOSPITAL_COMMUNITY): Payer: Self-pay | Admitting: *Deleted

## 2016-10-18 MED ORDER — DEXTROSE 5 % IV SOLN
3.0000 g | INTRAVENOUS | Status: AC
  Administered 2016-10-19: 3 g via INTRAVENOUS
  Filled 2016-10-18: qty 3000

## 2016-10-18 NOTE — Anesthesia Preprocedure Evaluation (Addendum)
Anesthesia Evaluation  Patient identified by MRN, date of birth, ID band Patient awake    Reviewed: Allergy & Precautions, H&P , NPO status , Patient's Chart, lab work & pertinent test results  History of Anesthesia Complications (+) PONV, POST - OP SPINAL HEADACHE and history of anesthetic complications  Airway Mallampati: II  TM Distance: >3 FB Neck ROM: full    Dental  (+) Poor Dentition, Dental Advidsory Given, Chipped   Pulmonary shortness of breath and at rest, asthma , sleep apnea and Continuous Positive Airway Pressure Ventilation , COPD,  COPD inhaler,     + wheezing (cleared with albuterol)      Cardiovascular hypertension, Pt. on medications and Pt. on home beta blockers (-) angina+ Past MI and +CHF  (-) CAD and (-) Cardiac Stents  Rhythm:regular Rate:Normal  11/17 cath: normal LVF, normal coronaries   Neuro/Psych  Headaches, Seizures -,  PSYCHIATRIC DISORDERS    GI/Hepatic Neg liver ROS, GERD  Medicated and Poorly Controlled,  Endo/Other  diabetes (glu 167), Insulin DependentMorbid obesity  Renal/GU Renal diseasenegative Renal ROS     Musculoskeletal  (+) Arthritis ,   Abdominal (+) + obese,   Peds  Hematology  (+) Blood dyscrasia, anemia ,   Anesthesia Other Findings Last episode of chest pain 10/18/2016 Last seizure was three weeks ago.  Reproductive/Obstetrics                           Anesthesia Physical Anesthesia Plan  ASA: III  Anesthesia Plan: General   Post-op Pain Management:    Induction: Intravenous  Airway Management Planned: Oral ETT  Additional Equipment:   Intra-op Plan:   Post-operative Plan: Extubation in OR  Informed Consent:   Dental Advisory Given  Plan Discussed with: Anesthesiologist, Surgeon and CRNA  Anesthesia Plan Comments: (Plan routine monitors, GETA)       Anesthesia Quick Evaluation

## 2016-10-18 NOTE — Progress Notes (Signed)
Pt denies SOB and chest pain but is under the care of Dr. Doylene Canard, Cardiology. Pt made aware to stop taking vitamins and herbal medications. Do not take any NSAIDs ie: Ibuprofen, Advil, Naproxen , BC and Goody Powder and Voltaren. Pt stated that her fasting blood glucose ranges from 120-140. Pt made aware of diabetes protocol to take 70% of Novolin 70/30 tonight    ( 42 units instead of 60). Pt made aware of diabetes protocol to check blood glucose every 2 hours prior to arrival, interventions for a blood glucose < 70 and >220 ( no Sliding scale insulin). Pt made aware to not take Novolin 70/30 insulin and Metformin the morning of surgery. Pt verbalized understanding of all pre-op instructions. Anesthesia asked to review pt cardiac history.

## 2016-10-18 NOTE — Progress Notes (Addendum)
Anesthesia follow-up: SAME DAY WORK-UP.  See anesthesia anesthesia note by Willeen Cass, FNP-BC on 09/18/16. Patient had been evaluated for chest pain. Her cardiologist Dr. Doylene Canard was notified. Ultimately, patient was admitted and had cardiac cath showing normal coronaries. (Cath was done following an intermediate risk stress test.)  Pattent is a 60 year old female scheduled for multiple dental extractions with alveoloplasty on 10/18/16 with Diona Browner, DDS.   EKG 09/19/16: NSR.  Cardiac cath 09/20/16: Dominance: Right  Left Main  Vessel was injected. Vessel is normal in caliber. Vessel is angiographically normal.  Left Anterior Descending  Vessel was injected. Vessel is normal in caliber. Vessel is angiographically normal.  Ramus Intermedius  Vessel is small.  Left Circumflex  Vessel was injected. Vessel is normal in caliber. Vessel is angiographically normal.  Right Coronary Artery  Vessel was injected. Vessel is normal in caliber. Vessel is angiographically normal.   The left ventricular systolic function is normal.  LV end diastolic pressure is mildly elevated.  The left ventricular ejection fraction is 50-55% by visual estimate. Non-cardiac chest pain evaluation.  Nuclear stress test 09/20/16:  Defect 1: There is a small defect of mild severity present in the apical lateral location.  The left ventricular ejection fraction is mildly decreased (45-54%).  Nuclear stress EF: 48%.  This is an intermediate risk study.  Echo 03/10/14:  - Left ventricle: The cavity size was normal. There was mild concentric hypertrophy. Systolic function was mildly reduced. The estimated ejection fraction was in the range of 45% to 50%. There is mild hypokinesis of the inferior myocardium. Doppler parameters are consistent with abnormal left ventricular relaxation (grade 1 diastolic dysfunction). - Mitral valve: Mild regurgitation. - Left atrium: The atrium was mildly dilated. - Pulmonary  arteries: Systolic pressure was mildly increased. PA peak pressure: 110mm Hg (S).  She will need updated labs. She does not have a recent A1c in Epic. Historically, she has been anemic with HGB ~ 9-10 (on ferrous sulfate).   If labs are acceptable and otherwise not acute changes then I anticipate that she can proceed as planned.  George Hugh [facility] Short Stay Center/Anesthesiology Phone [phone] 10/18/2016 11:00 AM

## 2016-10-19 ENCOUNTER — Encounter (HOSPITAL_COMMUNITY)
Admission: RE | Disposition: A | Discharge status: Home / Self Care | Payer: Self-pay | Source: Ambulatory Visit | Attending: Oral Surgery

## 2016-10-19 ENCOUNTER — Ambulatory Visit (HOSPITAL_COMMUNITY): Payer: Medicaid Other | Admitting: Vascular Surgery

## 2016-10-19 ENCOUNTER — Encounter (HOSPITAL_COMMUNITY): Payer: Self-pay | Admitting: *Deleted

## 2016-10-19 ENCOUNTER — Ambulatory Visit (HOSPITAL_COMMUNITY)
Admission: RE | Admit: 2016-10-19 | Discharge: 2016-10-19 | Disposition: A | Discharge status: Home / Self Care | Payer: Medicaid Other | Source: Ambulatory Visit | Attending: Oral Surgery | Admitting: Oral Surgery

## 2016-10-19 DIAGNOSIS — I10 Essential (primary) hypertension: Secondary | ICD-10-CM | POA: Insufficient documentation

## 2016-10-19 DIAGNOSIS — I110 Hypertensive heart disease with heart failure: Secondary | ICD-10-CM | POA: Insufficient documentation

## 2016-10-19 DIAGNOSIS — I509 Heart failure, unspecified: Secondary | ICD-10-CM | POA: Insufficient documentation

## 2016-10-19 DIAGNOSIS — Z7982 Long term (current) use of aspirin: Secondary | ICD-10-CM | POA: Insufficient documentation

## 2016-10-19 DIAGNOSIS — K219 Gastro-esophageal reflux disease without esophagitis: Secondary | ICD-10-CM | POA: Diagnosis not present

## 2016-10-19 DIAGNOSIS — Z79899 Other long term (current) drug therapy: Secondary | ICD-10-CM | POA: Insufficient documentation

## 2016-10-19 DIAGNOSIS — E119 Type 2 diabetes mellitus without complications: Secondary | ICD-10-CM | POA: Insufficient documentation

## 2016-10-19 DIAGNOSIS — M8938 Hypertrophy of bone, other site: Secondary | ICD-10-CM | POA: Diagnosis not present

## 2016-10-19 DIAGNOSIS — J449 Chronic obstructive pulmonary disease, unspecified: Secondary | ICD-10-CM | POA: Insufficient documentation

## 2016-10-19 DIAGNOSIS — I2510 Atherosclerotic heart disease of native coronary artery without angina pectoris: Secondary | ICD-10-CM | POA: Insufficient documentation

## 2016-10-19 DIAGNOSIS — K0530 Chronic periodontitis, unspecified: Secondary | ICD-10-CM | POA: Diagnosis not present

## 2016-10-19 DIAGNOSIS — G4730 Sleep apnea, unspecified: Secondary | ICD-10-CM | POA: Insufficient documentation

## 2016-10-19 DIAGNOSIS — I252 Old myocardial infarction: Secondary | ICD-10-CM | POA: Diagnosis not present

## 2016-10-19 DIAGNOSIS — K048 Radicular cyst: Secondary | ICD-10-CM | POA: Insufficient documentation

## 2016-10-19 DIAGNOSIS — M329 Systemic lupus erythematosus, unspecified: Secondary | ICD-10-CM | POA: Diagnosis not present

## 2016-10-19 DIAGNOSIS — Z6841 Body Mass Index (BMI) 40.0 and over, adult: Secondary | ICD-10-CM | POA: Diagnosis not present

## 2016-10-19 DIAGNOSIS — Z794 Long term (current) use of insulin: Secondary | ICD-10-CM | POA: Insufficient documentation

## 2016-10-19 DIAGNOSIS — D573 Sickle-cell trait: Secondary | ICD-10-CM | POA: Insufficient documentation

## 2016-10-19 DIAGNOSIS — K029 Dental caries, unspecified: Secondary | ICD-10-CM | POA: Insufficient documentation

## 2016-10-19 DIAGNOSIS — E785 Hyperlipidemia, unspecified: Secondary | ICD-10-CM | POA: Insufficient documentation

## 2016-10-19 HISTORY — DX: Dental caries, unspecified: K02.9

## 2016-10-19 HISTORY — PX: MULTIPLE EXTRACTIONS WITH ALVEOLOPLASTY: SHX5342

## 2016-10-19 HISTORY — DX: Other specified postprocedural states: R11.2

## 2016-10-19 HISTORY — DX: Other specified postprocedural states: Z98.890

## 2016-10-19 HISTORY — DX: Other reaction to spinal and lumbar puncture: G97.1

## 2016-10-19 LAB — GLUCOSE, CAPILLARY
GLUCOSE-CAPILLARY: 167 mg/dL — AB (ref 65–99)
Glucose-Capillary: 180 mg/dL — ABNORMAL HIGH (ref 65–99)

## 2016-10-19 LAB — BASIC METABOLIC PANEL
Anion gap: 10 (ref 5–15)
BUN: 8 mg/dL (ref 6–20)
CHLORIDE: 102 mmol/L (ref 101–111)
CO2: 27 mmol/L (ref 22–32)
CREATININE: 0.8 mg/dL (ref 0.44–1.00)
Calcium: 8.8 mg/dL — ABNORMAL LOW (ref 8.9–10.3)
Glucose, Bld: 164 mg/dL — ABNORMAL HIGH (ref 65–99)
POTASSIUM: 3.7 mmol/L (ref 3.5–5.1)
SODIUM: 139 mmol/L (ref 135–145)

## 2016-10-19 LAB — CBC
HCT: 33.5 % — ABNORMAL LOW (ref 36.0–46.0)
Hemoglobin: 10.3 g/dL — ABNORMAL LOW (ref 12.0–15.0)
MCH: 20 pg — ABNORMAL LOW (ref 26.0–34.0)
MCHC: 30.7 g/dL (ref 30.0–36.0)
MCV: 65.2 fL — AB (ref 78.0–100.0)
PLATELETS: 237 10*3/uL (ref 150–400)
RBC: 5.14 MIL/uL — AB (ref 3.87–5.11)
RDW: 20.1 % — ABNORMAL HIGH (ref 11.5–15.5)
WBC: 6.1 10*3/uL (ref 4.0–10.5)

## 2016-10-19 SURGERY — MULTIPLE EXTRACTION WITH ALVEOLOPLASTY
Anesthesia: General | Site: Mouth

## 2016-10-19 MED ORDER — LIDOCAINE-EPINEPHRINE (PF) 1 %-1:200000 IJ SOLN
INTRAMUSCULAR | Status: AC
  Filled 2016-10-19: qty 30

## 2016-10-19 MED ORDER — AMOXICILLIN 500 MG PO CAPS: 500.0000 mg | ORAL_CAPSULE | Freq: Three times a day (TID) | ORAL | 0 refills | Status: DC

## 2016-10-19 MED ORDER — FENTANYL CITRATE (PF) 100 MCG/2ML IJ SOLN
INTRAMUSCULAR | Status: DC | PRN
  Administered 2016-10-19 (×4): 50 ug via INTRAVENOUS

## 2016-10-19 MED ORDER — LIDOCAINE HCL (CARDIAC) 20 MG/ML IV SOLN
INTRAVENOUS | Status: DC | PRN
  Administered 2016-10-19: 30 mg via INTRAVENOUS

## 2016-10-19 MED ORDER — LABETALOL HCL 5 MG/ML IV SOLN
INTRAVENOUS | Status: AC
  Filled 2016-10-19: qty 4

## 2016-10-19 MED ORDER — FENTANYL CITRATE (PF) 100 MCG/2ML IJ SOLN: 25.0000 ug | INTRAMUSCULAR | Status: DC | PRN

## 2016-10-19 MED ORDER — SODIUM CHLORIDE 0.9 % IR SOLN
Status: DC | PRN
  Administered 2016-10-19: 1000 mL

## 2016-10-19 MED ORDER — OXYMETAZOLINE HCL 0.05 % NA SOLN
NASAL | Status: AC
  Filled 2016-10-19: qty 15

## 2016-10-19 MED ORDER — SUCCINYLCHOLINE CHLORIDE 20 MG/ML IJ SOLN
INTRAMUSCULAR | Status: DC | PRN
  Administered 2016-10-19: 100 mg via INTRAVENOUS

## 2016-10-19 MED ORDER — ONDANSETRON HCL 4 MG/2ML IJ SOLN
INTRAMUSCULAR | Status: DC | PRN
  Administered 2016-10-19: 4 mg via INTRAVENOUS

## 2016-10-19 MED ORDER — MIDAZOLAM HCL 5 MG/5ML IJ SOLN
INTRAMUSCULAR | Status: DC | PRN
  Administered 2016-10-19: 2 mg via INTRAVENOUS

## 2016-10-19 MED ORDER — MEPERIDINE HCL 25 MG/ML IJ SOLN: 6.2500 mg | INTRAMUSCULAR | Status: DC | PRN

## 2016-10-19 MED ORDER — MIDAZOLAM HCL 2 MG/2ML IJ SOLN: 0.5000 mg | Freq: Once | INTRAMUSCULAR | Status: DC | PRN

## 2016-10-19 MED ORDER — LABETALOL HCL 5 MG/ML IV SOLN
INTRAVENOUS | Status: DC | PRN
  Administered 2016-10-19: 5 mg via INTRAVENOUS

## 2016-10-19 MED ORDER — 0.9 % SODIUM CHLORIDE (POUR BTL) OPTIME
TOPICAL | Status: DC | PRN
  Administered 2016-10-19: 1000 mL

## 2016-10-19 MED ORDER — ROCURONIUM BROMIDE 100 MG/10ML IV SOLN
INTRAVENOUS | Status: DC | PRN
  Administered 2016-10-19: 40 mg via INTRAVENOUS

## 2016-10-19 MED ORDER — HYDROCODONE-ACETAMINOPHEN 10-325 MG PO TABS: 1.0000 | ORAL_TABLET | Freq: Four times a day (QID) | ORAL | 0 refills | Status: DC | PRN

## 2016-10-19 MED ORDER — LIDOCAINE-EPINEPHRINE (PF) 1 %-1:200000 IJ SOLN
INTRAMUSCULAR | Status: DC | PRN
  Administered 2016-10-19: 19 mL

## 2016-10-19 MED ORDER — PROPOFOL 10 MG/ML IV BOLUS
INTRAVENOUS | Status: DC | PRN
  Administered 2016-10-19: 150 mg via INTRAVENOUS

## 2016-10-19 MED ORDER — FENTANYL CITRATE (PF) 100 MCG/2ML IJ SOLN
INTRAMUSCULAR | Status: AC
  Filled 2016-10-19: qty 2

## 2016-10-19 MED ORDER — MIDAZOLAM HCL 2 MG/2ML IJ SOLN
INTRAMUSCULAR | Status: AC
  Filled 2016-10-19: qty 2

## 2016-10-19 MED ORDER — LACTATED RINGERS IV SOLN
INTRAVENOUS | Status: DC | PRN
  Administered 2016-10-19: 07:00:00 via INTRAVENOUS

## 2016-10-19 MED ORDER — PROPOFOL 10 MG/ML IV BOLUS
INTRAVENOUS | Status: AC
  Filled 2016-10-19: qty 20

## 2016-10-19 SURGICAL SUPPLY — 34 items
BUR CROSS CUT FISSURE 1.6 (BURR) ×2 IMPLANT
BUR CROSS CUT FISSURE 1.6MM (BURR) ×1
BUR EGG ELITE 4.0 (BURR) ×1 IMPLANT
BUR EGG ELITE 4.0MM (BURR) ×1
CANISTER SUCTION 2500CC (MISCELLANEOUS) ×3 IMPLANT
COVER SURGICAL LIGHT HANDLE (MISCELLANEOUS) ×3 IMPLANT
CRADLE DONUT ADULT HEAD (MISCELLANEOUS) ×3 IMPLANT
DECANTER SPIKE VIAL GLASS SM (MISCELLANEOUS) IMPLANT
DRAPE U-SHAPE 76X120 STRL (DRAPES) ×2 IMPLANT
FLUID NSS /IRRIG 1000 ML XXX (MISCELLANEOUS) ×3 IMPLANT
GAUZE PACKING FOLDED 2  STR (GAUZE/BANDAGES/DRESSINGS) ×2
GAUZE PACKING FOLDED 2 STR (GAUZE/BANDAGES/DRESSINGS) ×1 IMPLANT
GLOVE BIO SURGEON STRL SZ 6.5 (GLOVE) ×1 IMPLANT
GLOVE BIO SURGEON STRL SZ7.5 (GLOVE) ×1 IMPLANT
GLOVE BIO SURGEONS STRL SZ 6.5 (GLOVE)
GLOVE BIOGEL PI IND STRL 7.0 (GLOVE) ×1 IMPLANT
GLOVE BIOGEL PI INDICATOR 7.0 (GLOVE)
GLOVE SURG SS PI 6.5 STRL IVOR (GLOVE) ×2 IMPLANT
GLOVE SURG SS PI 7.5 STRL IVOR (GLOVE) ×2 IMPLANT
GOWN STRL REUS W/ TWL LRG LVL3 (GOWN DISPOSABLE) ×1 IMPLANT
GOWN STRL REUS W/ TWL XL LVL3 (GOWN DISPOSABLE) ×1 IMPLANT
GOWN STRL REUS W/TWL LRG LVL3 (GOWN DISPOSABLE) ×3
GOWN STRL REUS W/TWL XL LVL3 (GOWN DISPOSABLE) ×3
KIT BASIN OR (CUSTOM PROCEDURE TRAY) ×3 IMPLANT
KIT ROOM TURNOVER OR (KITS) ×3 IMPLANT
NEEDLE 22X1 1/2 (OR ONLY) (NEEDLE) ×6 IMPLANT
NS IRRIG 1000ML POUR BTL (IV SOLUTION) ×3 IMPLANT
PAD ARMBOARD 7.5X6 YLW CONV (MISCELLANEOUS) ×3 IMPLANT
SUT CHROMIC 3 0 PS 2 (SUTURE) ×6 IMPLANT
SYR CONTROL 10ML LL (SYRINGE) ×3 IMPLANT
TOWEL OR 17X26 10 PK STRL BLUE (TOWEL DISPOSABLE) ×3 IMPLANT
TRAY ENT MC OR (CUSTOM PROCEDURE TRAY) ×3 IMPLANT
TUBING IRRIGATION (MISCELLANEOUS) ×3 IMPLANT
YANKAUER SUCT BULB TIP NO VENT (SUCTIONS) ×3 IMPLANT

## 2016-10-19 NOTE — Op Note (Signed)
10/19/2016  8:23 AM  PATIENT:  Chelsea Lester  60 y.o. female  PRE-OPERATIVE DIAGNOSIS:  Nonrestoralbe teeth # 6, 7, 8, 11, 12, 23, 24, 25, 26, 28, secondary to DENTAL CARIES, PERIODONTITIS, LESION LEFT MANDIBLE  POST-OPERATIVE DIAGNOSIS:  SAME  PROCEDURE:  Procedure(s):  EXTRACTION Teeth Number 6, 7, 8, 11, 12, 23, 24, 25, 26, 28,  ALVEOLOPLASTY right and left maxilla; Biopsy bone left mandible  SURGEON:  Surgeon(s): Diona Browner, DDS  ANESTHESIA:   local and general  EBL:  minimal  DRAINS: none   SPECIMEN:  No Specimen  COUNTS:  YES  PLAN OF CARE: Discharge to home after PACU  PATIENT DISPOSITION:  PACU - hemodynamically stable.   PROCEDURE DETAILS: Dictation # [id number]  Gae Bon, DMD 10/19/2016 8:23 AM

## 2016-10-19 NOTE — Transfer of Care (Signed)
Immediate Anesthesia Transfer of Care Note  Patient: Chelsea Lester  Procedure(s) Performed: Procedure(s): MULTIPLE EXTRACTION WITH ALVEOLOPLASTY (N/A)  Patient Location: PACU  Anesthesia Type:General  Level of Consciousness: awake, alert  and oriented  Airway & Oxygen Therapy: Patient Spontanous Breathing and Patient connected to face mask oxygen  Post-op Assessment: Report given to RN, Post -op Vital signs reviewed and stable and Patient moving all extremities X 4  Post vital signs: Reviewed and stable  Last Vitals:  Vitals:   10/19/16 0643 10/19/16 0835  BP: (!) 209/97   Pulse:    Resp:    Temp:  (P) 36.5 C    Last Pain:  Vitals:   10/19/16 0613  TempSrc: Oral         Complications: No apparent anesthesia complications

## 2016-10-19 NOTE — Anesthesia Postprocedure Evaluation (Signed)
Anesthesia Post Note  Patient: Talitha Givens  Procedure(s) Performed: Procedure(s) (LRB): MULTIPLE EXTRACTION WITH ALVEOLOPLASTY (N/A)  Patient location during evaluation: PACU Anesthesia Type: General Level of consciousness: awake and alert, patient cooperative and oriented Pain management: pain level controlled Vital Signs Assessment: post-procedure vital signs reviewed and stable Respiratory status: spontaneous breathing, nonlabored ventilation and respiratory function stable Cardiovascular status: blood pressure returned to baseline and stable Postop Assessment: no signs of nausea or vomiting Anesthetic complications: no    Last Vitals:  Vitals:   10/19/16 0930 10/19/16 0931  BP:  (!) 117/52  Pulse:  82  Resp:  14  Temp: 36.1 C     Last Pain:  Vitals:   10/19/16 0931  TempSrc:   PainSc: 0-No pain                 Fama Muenchow,E. Jaymie Misch

## 2016-10-19 NOTE — Anesthesia Procedure Notes (Signed)
Procedure Name: Intubation Date/Time: 10/19/2016 7:30 AM Performed by: Neldon Newport Pre-anesthesia Checklist: Timeout performed, Patient being monitored, Suction available, Emergency Drugs available and Patient identified Patient Re-evaluated:Patient Re-evaluated prior to inductionOxygen Delivery Method: Circle system utilized Preoxygenation: Pre-oxygenation with 100% oxygen Intubation Type: IV induction, Rapid sequence and Cricoid Pressure applied Ventilation: Mask ventilation without difficulty Laryngoscope Size: Mac and 3 Grade View: Grade II Tube type: Oral Tube size: 7.0 mm Number of attempts: 1 Intubation method: shoulder roll placed prior to induction. Placement Confirmation: breath sounds checked- equal and bilateral,  positive ETCO2 and ETT inserted through vocal cords under direct vision Secured at: 22 cm Tube secured with: Tape Dental Injury: Teeth and Oropharynx as per pre-operative assessment

## 2016-10-19 NOTE — H&P (Signed)
H&P documentation  -History and Physical Reviewed  -Patient has been re-examined  -No change in the plan of care  Chelsea Lester M  

## 2016-10-20 ENCOUNTER — Encounter (HOSPITAL_COMMUNITY): Payer: Self-pay | Admitting: Oral Surgery

## 2016-10-20 LAB — HEMOGLOBIN A1C
HEMOGLOBIN A1C: 8.1 % — AB (ref 4.8–5.6)
Mean Plasma Glucose: 186 mg/dL

## 2016-10-22 NOTE — Op Note (Signed)
NAME:  Chelsea Lester, Chelsea Lester                   ACCOUNT NO.:  MEDICAL RECORD NO.:  [id number]  LOCATION:                                 FACILITY:  PHYSICIAN:  Gae Bon, M.D.  DATE OF BIRTH:  Jan 02, 1956  DATE OF PROCEDURE:  10/19/2016 DATE OF DISCHARGE:                              OPERATIVE REPORT   PREOPERATIVE DIAGNOSES:  Nonrestorable teeth #6,7,8,11,12,23,24,25,26, 28 secondary to dental caries, periodontitis, bony lesion, left mandible.  POSTOPERATIVE DIAGNOSES:  Nonrestorable teeth #6,7,8,11,12,23,24,25,26, 28 secondary to dental caries, periodontitis, bony lesion, left mandible.  PROCEDURE:  Extraction of teeth #6,7,8,11,12,23,24,25,26, 28, alveoplasty right and left maxilla, biopsy of bone, left mandible.  SURGEON:  Gae Bon, M.D.  ANESTHESIA:  General oral intubation, Dr. Glennon Mac, attending.  DESCRIPTION OF PROCEDURE:  The patient was taken to the operating room, placed on the table in supine position.  General anesthesia was administered and an oral endotracheal tube was placed and marked.  The eyes were protected.  The patient was draped for the procedure.  Time- out was performed.  The posterior pharynx was suctioned and throat pack was placed.  A 2% lidocaine with 1:100,000 epinephrine was infiltrated in an inferior alveolar block on the right and left side and buccal and palatal infiltration in the maxilla around the teeth to be removed. Total of 19 mL was utilized.  Bite block was placed in the right side of the mouth and a sweetheart retractors was used to retract the tongue.  A #15 blade used to make an incision around teeth #23, 24, 25, 26, in the gingival sulcus.  The periosteum was reflected with a periosteal elevator.  The teeth were elevated and removed from the mouth with a dental forceps and the sockets were curetted.  No lesion was noted in the area of 24 and 25.  Then, the areas were closed with 3-0 chromic. Then, 15 blade was used to make  an incision along the superior crest of the left body of the left mandible, approximately 3 cm in length.  The periosteum was reflected.  A bony window was removed overlying the mandible using a Stryker handpiece and dental elevators and then a segment of bone was removed and submitted for pathological examination. Then, the area was smoothed and irrigated and closed with 3-0 chromic. Then, in the maxilla, 15 blade used to make an incision around teeth numbers 11 and 12.  There was a slight bony overhang in the area of tooth #12, and so the incision was carried proximally and distally, approximately 1 cm from the margins of teeth.  Then, the elevator was used to reflect the periosteum.  The teeth were elevated and removed with a dental forceps.  Periosteum was reflected to expose the alveolar crest and then alveoplasty was performed using the egg-shaped bur and bone file.  The areas were irrigated and then closed with 3-0 chromic. The endotracheal tube was moved to the other side of the mouth, and the bite block was repositioned as well.  Then #15 blade was used to make an incision around teeth #28 in the mandible and 6,7,8 in the maxilla. Periosteum was reflected.  The teeth  were elevated with a 301 elevator and removed from the mouth with the dental forceps.  Then, the alveolar crest was exposed in the maxilla and alveoplasty was performed using the egg-shaped bur and bone file.  Then, these areas were closed with 3-0 chromic after irrigating.  The oral cavity was inspected, found to have good contour and closure.  Then, the oral cavity was irrigated, suctioned.  Throat pack was removed.  The patient was left under the care of Anesthesia Service for transportation to the recovery room, and later dischargee through Day Surgery.  EBL:  Minimum.  COUNTS:  Correct.  SPECIMENS:  Bony lesion, left mandible.  PRELIMINARY DIAGNOSIS:  Focal osteosclerosis.     Gae Bon,  M.D.     SMJ/MEDQ  D:  10/19/2016  T:  10/20/2016  Job:  [id number]

## 2016-10-23 ENCOUNTER — Encounter: Payer: Self-pay | Admitting: Sports Medicine

## 2016-10-23 ENCOUNTER — Ambulatory Visit (INDEPENDENT_AMBULATORY_CARE_PROVIDER_SITE_OTHER): Payer: Medicaid Other | Admitting: Sports Medicine

## 2016-10-23 DIAGNOSIS — M79609 Pain in unspecified limb: Secondary | ICD-10-CM

## 2016-10-23 DIAGNOSIS — E1142 Type 2 diabetes mellitus with diabetic polyneuropathy: Secondary | ICD-10-CM

## 2016-10-23 DIAGNOSIS — B351 Tinea unguium: Secondary | ICD-10-CM

## 2016-10-23 NOTE — Progress Notes (Signed)
Patient ID: Chelsea Lester, female   DOB: [birth year], 60 y.o.   MRN: [id number] Subjective: Chelsea Lester is a 60 y.o. female patient with history of diabetes who returns to office today complaining of long, painful nails  while ambulating in shoes; unable to trim. Patient states that the glucose reading this morning was 180m/dl . Patient denies any new changes in medication or new problems. States that she had her teeth removed last week Patient denies any new cramping, numbness, burning or tingling in the legs. Patient has no other pedal complaints.  Patient Active Problem List   Diagnosis Date Noted  . Chest pain, precordial 09/18/2016    Class: Acute  . Precordial chest pain 09/18/2016  . Acute encephalopathy   . Hyperglycemia 08/26/2015  . Chronic high back pain 08/26/2015  . AKI (acute kidney injury) ([place] 08/26/2015  . Prolonged Q-T interval on ECG 08/26/2015  . OSA (obstructive sleep apnea) 08/26/2015  . Chest pain at rest 03/09/2014  . Family history of breast cancer 03/23/2013  . Family history of GI tract cancer 03/23/2013  . Dyspnea 05/02/2012  . Pulmonary infiltrates 05/02/2012  . Cough 05/02/2012  . Family history of malignant neoplasm of gastrointestinal tract 07/25/2011  . Abdominal pain, epigastric 07/25/2011  . UNSPECIFIED IRON DEFICIENCY ANEMIA 03/23/2009  . Arthritis, degenerative 01/05/2009  . HEMORRHOIDS, INTERNAL 10/05/2008  . RHINITIS 01/14/2008  . HYPOTENSION 11/14/2007  . CELLULITIS AND ABSCESS OF TRUNK 11/14/2007  . HYPOKALEMIA 10/28/2007  . UTI 10/28/2007  . DIZZINESS 10/28/2007  . Irritable bowel syndrome 08/13/2007  . EXOPHTHALMOS NOS 05/26/2007  . BILIRUBINURIA 05/26/2007  . Bulging eyes 05/26/2007  . DRUG ABUSE 04/14/2007  . SINUSITIS, MAXILLARY, ACUTE 03/24/2007  . ANXIETY DISORDER, GENERALIZED 03/04/2007  . CALF PAIN 03/04/2007  . BENIGN NEOPLASM [place] 01/17/2007  . ESOPHAGEAL STRICTURE 01/17/2007  .  HYPERLIPIDEMIA 01/14/2007  . INSOMNIA 12/03/2006  . POLYP, COLON 09/12/2006  . Uncontrolled diabetes mellitus ([place] 09/12/2006  . Obesity 09/12/2006  . Hypertension 09/12/2006  . Asthma 09/12/2006  . GERD 09/12/2006  . OSTEOARTHRITIS 09/12/2006  . MICROCYTOSIS 09/12/2006  . HYSTERECTOMY, HX OF 09/12/2006  . Essential (primary) hypertension 09/12/2006  . Type 2 diabetes mellitus ([place] 09/12/2006   Current Outpatient Prescriptions on File Prior to Visit  Medication Sig Dispense Refill  . albuterol (PROVENTIL,VENTOLIN) 90 MCG/ACT inhaler Inhale 2 puffs into the lungs every 6 (six) hours as needed for shortness of breath. Wheezing or shortness of breath    . ALPRAZolam (XANAX) 0.5 MG tablet Take 1 tablet by mouth at bedtime as needed for anxiety or sleep.   0  . amLODipine (NORVASC) 10 MG tablet Take 10 mg by mouth daily.  0  . amoxicillin (AMOXIL) 500 MG capsule Take 1 capsule (500 mg total) by mouth 3 (three) times daily. 21 capsule 0  . aspirin 81 MG tablet Take 81 mg by mouth daily.      . citalopram (CELEXA) 10 MG tablet Take 10 mg by mouth daily.   0  . colchicine 0.6 MG tablet Take 0.6 mg by mouth daily.   0  . diclofenac sodium (VOLTAREN) 1 % GEL Apply 2 g topically 4 (four) times daily as needed (pain).     .Marland Kitchendicyclomine (BENTYL) 10 MG capsule Take 1 tab 3 times a day before meals. (Patient taking differently: Take 10 mg by mouth 4 (four) times daily -  before meals and at bedtime. ) 90 capsule 1  . docusate sodium (COLACE)  100 MG capsule Take 1 capsule (100 mg total) by mouth 2 (two) times daily as needed for mild constipation. 60 capsule 0  . ergocalciferol (VITAMIN D2) 50000 UNITS capsule Take 1 capsule (50,000 Units total) by mouth once a week. Usually takes on Saturday or fridays 30 capsule 0  . famotidine (PEPCID) 20 MG tablet Take 20 mg by mouth daily.    . fenofibrate (TRICOR) 145 MG tablet Take 1 tablet (145 mg total) by mouth daily. 30 tablet 0  . ferrous sulfate 325 (65  FE) MG tablet Take 1 tablet (325 mg total) by mouth daily with breakfast. 90 tablet 3  . fluticasone (FLONASE) 50 MCG/ACT nasal spray Place 1 spray into both nostrils 2 (two) times daily. 1 g 2  . fluticasone-salmeterol (ADVAIR HFA) 115-21 MCG/ACT inhaler Inhale 2 puffs into the lungs 2 (two) times daily. 1 Inhaler 12  . gabapentin (NEURONTIN) 300 MG capsule Take 1 capsule (300 mg total) by mouth at bedtime. 30 capsule 2  . HYDROcodone-acetaminophen (NORCO) 10-325 MG tablet Take 1-2 tablets by mouth every 6 (six) hours as needed. 40 tablet 0  . insulin NPH-regular Human (NOVOLIN 70/30) (70-30) 100 UNIT/ML injection Inject 60 Units into the skin daily with breakfast. (Patient taking differently: Inject 60 Units into the skin 2 (two) times daily. ) 10 mL 11  . ipratropium (ATROVENT) 0.02 % nebulizer solution Take 2.5 mLs (0.5 mg total) by nebulization 3 (three) times daily as needed for wheezing or shortness of breath. 75 mL 12  . Lancets MISC by Does not apply route.      . loratadine (CLARITIN) 10 MG tablet Take 10 mg by mouth daily.     . metFORMIN (GLUCOPHAGE) 500 MG tablet Take 2 tablets (1,000 mg total) by mouth 2 (two) times daily with a meal. (Patient taking differently: Take 500 mg by mouth 2 (two) times daily with a meal. ) 60 tablet 3  . methocarbamol (ROBAXIN) 500 MG tablet Take 1 tablet (500 mg total) by mouth 2 (two) times daily. 20 tablet 0  . metoprolol (LOPRESSOR) 100 MG tablet Take 100 mg by mouth 2 (two) times daily.  0  . metoprolol (LOPRESSOR) 50 MG tablet Take 1 tablet (50 mg total) by mouth 2 (two) times daily. (Patient not taking: Reported on 10/19/2016) 30 tablet 0  . nitroGLYCERIN (NITROSTAT) 0.4 MG SL tablet Place 0.4 mg under the tongue every 5 (five) minutes as needed for chest pain.   0  . ondansetron (ZOFRAN ODT) 4 MG disintegrating tablet 95m ODT q4 hours prn nausea/vomit (Patient taking differently: Take 4 mg by mouth every 8 (eight) hours as needed for nausea or  vomiting. 454mODT q4 hours prn nausea/vomit) 20 tablet 0  . pantoprazole (PROTONIX) 40 MG tablet Take 1 tablet (40 mg total) by mouth daily. 30 mins prior to breakfast. 30 tablet 6  . polyethylene glycol (MIRALAX / GLYCOLAX) packet Take 17 g by mouth daily. 14 each 0  . potassium chloride (K-DUR) 10 MEQ tablet Take 1 tablet (10 mEq total) by mouth 3 (three) times daily. 45 tablet 0  . promethazine (PHENERGAN) 25 MG tablet Take 0.5 tablets (12.5 mg total) by mouth every 6 (six) hours as needed for nausea or vomiting. (Patient taking differently: Take 25 mg by mouth every 6 (six) hours as needed for nausea or vomiting. ) 30 tablet 0  . RESTASIS 0.05 % ophthalmic emulsion Place 1 drop into both eyes 2 (two) times daily.   0  .  simvastatin (ZOCOR) 40 MG tablet Take 0.5 tablets (20 mg total) by mouth at bedtime. 30 tablet 0  . tobramycin (TOBREX) 0.3 % ophthalmic ointment Place 1 application into both eyes 4 (four) times daily.     . travoprost, benzalkonium, (TRAVATAN) 0.004 % ophthalmic solution Place 1 drop into both eyes at bedtime. 2.5 mL 12   No current facility-administered medications on file prior to visit.    Allergies  Allergen Reactions  . Ace Inhibitors     UNSPECIFIED REACTION   . Latex Rash  . Percocet [Oxycodone-Acetaminophen] Itching         Recent Results (from the past 2160 hour(s))  Urinalysis, Routine w reflex microscopic     Status: Abnormal   Collection Time: 09/13/16  1:19 AM  Result Value Ref Range   Color, Urine YELLOW YELLOW   APPearance CLEAR CLEAR   Specific Gravity, Urine 1.015 1.005 - 1.030   pH 6.0 5.0 - 8.0   Glucose, UA NEGATIVE NEGATIVE mg/dL   Hgb urine dipstick NEGATIVE NEGATIVE   Bilirubin Urine NEGATIVE NEGATIVE   Ketones, ur NEGATIVE NEGATIVE mg/dL   Protein, ur 100 (A) NEGATIVE mg/dL   Nitrite NEGATIVE NEGATIVE   Leukocytes, UA NEGATIVE NEGATIVE  Urine microscopic-add on     Status: Abnormal   Collection Time: 09/13/16  1:19 AM  Result Value  Ref Range   Squamous Epithelial / LPF 0-5 (A) NONE SEEN   WBC, UA NONE SEEN 0 - 5 WBC/hpf   RBC / HPF NONE SEEN 0 - 5 RBC/hpf   Bacteria, UA RARE (A) NONE SEEN  Lipase, blood     Status: None   Collection Time: 09/13/16  6:58 PM  Result Value Ref Range   Lipase 37 11 - 51 U/L  Comprehensive metabolic panel     Status: Abnormal   Collection Time: 09/13/16  6:58 PM  Result Value Ref Range   Sodium 141 135 - 145 mmol/L   Potassium 3.7 3.5 - 5.1 mmol/L   Chloride 106 101 - 111 mmol/L   CO2 28 22 - 32 mmol/L   Glucose, Bld 133 (H) 65 - 99 mg/dL   BUN 8 6 - 20 mg/dL   Creatinine, Ser 0.73 0.44 - 1.00 mg/dL   Calcium 9.0 8.9 - 10.3 mg/dL   Total Protein 7.5 6.5 - 8.1 g/dL   Albumin 3.8 3.5 - 5.0 g/dL   AST 11 (L) 15 - 41 U/L   ALT 12 (L) 14 - 54 U/L   Alkaline Phosphatase 57 38 - 126 U/L   Total Bilirubin 0.4 0.3 - 1.2 mg/dL   GFR calc non Af Amer >60 >60 mL/min   GFR calc Af Amer >60 >60 mL/min    Comment: (NOTE) The eGFR has been calculated using the CKD EPI equation. This calculation has not been validated in all clinical situations. eGFR's persistently <60 mL/min signify possible Chronic Kidney Disease.    Anion gap 7 5 - 15  CBC     Status: Abnormal   Collection Time: 09/13/16  6:58 PM  Result Value Ref Range   WBC 7.4 4.0 - 10.5 K/uL   RBC 4.85 3.87 - 5.11 MIL/uL   Hemoglobin 10.1 (L) 12.0 - 15.0 g/dL   HCT 32.0 (L) 36.0 - 46.0 %   MCV 66.0 (L) 78.0 - 100.0 fL   MCH 20.8 (L) 26.0 - 34.0 pg   MCHC 31.6 30.0 - 36.0 g/dL   RDW 20.4 (H) 11.5 - 15.5 %  Platelets 278 150 - 400 K/uL  Brain natriuretic peptide     Status: None   Collection Time: 09/13/16  6:58 PM  Result Value Ref Range   B Natriuretic Peptide 15.8 0.0 - 100.0 pg/mL  CK     Status: None   Collection Time: 09/13/16 11:39 PM  Result Value Ref Range   Total CK 117 38 - 234 U/L  D-dimer, quantitative (not at [facility])     Status: Abnormal   Collection Time: 09/13/16 11:39 PM  Result Value Ref Range    D-Dimer, Quant 0.55 (H) 0.00 - 0.50 ug/mL-FEU    Comment: (NOTE) At the manufacturer cut-off of 0.50 ug/mL FEU, this assay has been documented to exclude PE with a sensitivity and negative predictive value of 97 to 99%.  At this time, this assay has not been approved by the FDA to exclude DVT/VTE. Results should be correlated with clinical presentation.   TSH     Status: None   Collection Time: 09/13/16 11:39 PM  Result Value Ref Range   TSH 1.124 0.350 - 4.500 uIU/mL    Comment: Performed by a 3rd Generation assay with a functional sensitivity of <=0.01 uIU/mL.  T4, free     Status: None   Collection Time: 09/13/16 11:39 PM  Result Value Ref Range   Free T4 0.84 0.61 - 1.12 ng/dL    Comment: (NOTE) Biotin ingestion may interfere with free T4 tests. If the results are inconsistent with the TSH level, previous test results, or the clinical presentation, then consider biotin interference. If needed, order repeat testing after stopping biotin.   I-stat troponin, ED     Status: None   Collection Time: 09/13/16 11:45 PM  Result Value Ref Range   Troponin i, poc 0.00 0.00 - 0.08 ng/mL   Comment 3            Comment: Due to the release kinetics of cTnI, a negative result within the first hours of the onset of symptoms does not rule out myocardial infarction with certainty. If myocardial infarction is still suspected, repeat the test at appropriate intervals.   Glucose, capillary     Status: Abnormal   Collection Time: 09/18/16  8:35 AM  Result Value Ref Range   Glucose-Capillary 154 (H) 65 - 99 mg/dL  MRSA PCR Screening     Status: None   Collection Time: 09/18/16 12:19 PM  Result Value Ref Range   MRSA by PCR NEGATIVE NEGATIVE    Comment:        The GeneXpert MRSA Assay (FDA approved for NASAL specimens only), is one component of a comprehensive MRSA colonization surveillance program. It is not intended to diagnose MRSA infection nor to guide or monitor treatment  for MRSA infections.   Comprehensive metabolic panel     Status: Abnormal   Collection Time: 09/18/16  2:32 PM  Result Value Ref Range   Sodium 142 135 - 145 mmol/L   Potassium 4.0 3.5 - 5.1 mmol/L   Chloride 108 101 - 111 mmol/L   CO2 28 22 - 32 mmol/L   Glucose, Bld 118 (H) 65 - 99 mg/dL   BUN 8 6 - 20 mg/dL   Creatinine, Ser 0.80 0.44 - 1.00 mg/dL   Calcium 8.8 (L) 8.9 - 10.3 mg/dL   Total Protein 6.7 6.5 - 8.1 g/dL   Albumin 3.6 3.5 - 5.0 g/dL   AST 10 (L) 15 - 41 U/L   ALT 12 (L) 14 - 54 U/L  Alkaline Phosphatase 54 38 - 126 U/L   Total Bilirubin 0.2 (L) 0.3 - 1.2 mg/dL   GFR calc non Af Amer >60 >60 mL/min   GFR calc Af Amer >60 >60 mL/min    Comment: (NOTE) The eGFR has been calculated using the CKD EPI equation. This calculation has not been validated in all clinical situations. eGFR's persistently <60 mL/min signify possible Chronic Kidney Disease.    Anion gap 6 5 - 15  Troponin I     Status: None   Collection Time: 09/18/16  2:32 PM  Result Value Ref Range   Troponin I <0.03 <0.03 ng/mL  APTT     Status: None   Collection Time: 09/18/16  2:32 PM  Result Value Ref Range   aPTT 27 24 - 36 seconds  CBC     Status: Abnormal   Collection Time: 09/18/16  2:32 PM  Result Value Ref Range   WBC 7.5 4.0 - 10.5 K/uL   RBC 4.60 3.87 - 5.11 MIL/uL   Hemoglobin 9.3 (L) 12.0 - 15.0 g/dL   HCT 30.6 (L) 36.0 - 46.0 %   MCV 66.5 (L) 78.0 - 100.0 fL   MCH 20.2 (L) 26.0 - 34.0 pg   MCHC 30.4 30.0 - 36.0 g/dL   RDW 20.3 (H) 11.5 - 15.5 %   Platelets 251 150 - 400 K/uL  Protime-INR     Status: None   Collection Time: 09/18/16  2:32 PM  Result Value Ref Range   Prothrombin Time 12.7 11.4 - 15.2 seconds   INR 0.95   Glucose, capillary     Status: Abnormal   Collection Time: 09/18/16  4:52 PM  Result Value Ref Range   Glucose-Capillary 107 (H) 65 - 99 mg/dL  Troponin I     Status: None   Collection Time: 09/18/16  7:12 PM  Result Value Ref Range   Troponin I <0.03  <0.03 ng/mL  Heparin level (unfractionated)     Status: None   Collection Time: 09/18/16  8:58 PM  Result Value Ref Range   Heparin Unfractionated 0.36 0.30 - 0.70 IU/mL    Comment:        IF HEPARIN RESULTS ARE BELOW EXPECTED VALUES, AND PATIENT DOSAGE HAS BEEN CONFIRMED, SUGGEST FOLLOW UP TESTING OF ANTITHROMBIN III LEVELS.   Glucose, capillary     Status: Abnormal   Collection Time: 09/18/16  9:49 PM  Result Value Ref Range   Glucose-Capillary 149 (H) 65 - 99 mg/dL  Troponin I     Status: None   Collection Time: 09/19/16  2:21 AM  Result Value Ref Range   Troponin I <0.03 <0.03 ng/mL  Lipid panel     Status: Abnormal   Collection Time: 09/19/16  2:21 AM  Result Value Ref Range   Cholesterol 187 0 - 200 mg/dL   Triglycerides 279 (H) <150 mg/dL   HDL 43 >40 mg/dL   Total CHOL/HDL Ratio 4.3 RATIO   VLDL 56 (H) 0 - 40 mg/dL   LDL Cholesterol 88 0 - 99 mg/dL    Comment:        Total Cholesterol/HDL:CHD Risk Coronary Heart Disease Risk Table                     Men   Women  1/2 Average Risk   3.4   3.3  Average Risk       5.0   4.4  2 X Average Risk   9.6  7.1  3 X Average Risk  23.4   11.0        Use the calculated Patient Ratio above and the CHD Risk Table to determine the patient's CHD Risk.        ATP III CLASSIFICATION (LDL):  <100     mg/dL   Optimal  100-129  mg/dL   Near or Above                    Optimal  130-159  mg/dL   Borderline  160-189  mg/dL   High  >190     mg/dL   Very High   Heparin level (unfractionated)     Status: Abnormal   Collection Time: 09/19/16  2:21 AM  Result Value Ref Range   Heparin Unfractionated 0.22 (L) 0.30 - 0.70 IU/mL    Comment:        IF HEPARIN RESULTS ARE BELOW EXPECTED VALUES, AND PATIENT DOSAGE HAS BEEN CONFIRMED, SUGGEST FOLLOW UP TESTING OF ANTITHROMBIN III LEVELS.   CBC     Status: Abnormal   Collection Time: 09/19/16  2:21 AM  Result Value Ref Range   WBC 6.4 4.0 - 10.5 K/uL   RBC 4.39 3.87 - 5.11 MIL/uL    Hemoglobin 8.9 (L) 12.0 - 15.0 g/dL   HCT 29.0 (L) 36.0 - 46.0 %   MCV 66.1 (L) 78.0 - 100.0 fL   MCH 20.3 (L) 26.0 - 34.0 pg   MCHC 30.7 30.0 - 36.0 g/dL   RDW 19.9 (H) 11.5 - 15.5 %   Platelets 230 150 - 400 K/uL  Glucose, capillary     Status: Abnormal   Collection Time: 09/19/16  7:56 AM  Result Value Ref Range   Glucose-Capillary 139 (H) 65 - 99 mg/dL   Comment 1 Document in Chart   Heparin level (unfractionated)     Status: None   Collection Time: 09/19/16 10:44 AM  Result Value Ref Range   Heparin Unfractionated 0.36 0.30 - 0.70 IU/mL    Comment:        IF HEPARIN RESULTS ARE BELOW EXPECTED VALUES, AND PATIENT DOSAGE HAS BEEN CONFIRMED, SUGGEST FOLLOW UP TESTING OF ANTITHROMBIN III LEVELS.   Glucose, capillary     Status: Abnormal   Collection Time: 09/19/16 12:02 PM  Result Value Ref Range   Glucose-Capillary 204 (H) 65 - 99 mg/dL   Comment 1 Document in Chart   Glucose, capillary     Status: Abnormal   Collection Time: 09/19/16  4:18 PM  Result Value Ref Range   Glucose-Capillary 136 (H) 65 - 99 mg/dL  Heparin level (unfractionated)     Status: None   Collection Time: 09/19/16  8:14 PM  Result Value Ref Range   Heparin Unfractionated 0.50 0.30 - 0.70 IU/mL    Comment:        IF HEPARIN RESULTS ARE BELOW EXPECTED VALUES, AND PATIENT DOSAGE HAS BEEN CONFIRMED, SUGGEST FOLLOW UP TESTING OF ANTITHROMBIN III LEVELS.   Glucose, capillary     Status: Abnormal   Collection Time: 09/19/16 10:35 PM  Result Value Ref Range   Glucose-Capillary 187 (H) 65 - 99 mg/dL  Heparin level (unfractionated)     Status: None   Collection Time: 09/20/16  4:05 AM  Result Value Ref Range   Heparin Unfractionated 0.55 0.30 - 0.70 IU/mL    Comment:        IF HEPARIN RESULTS ARE BELOW EXPECTED VALUES, AND PATIENT DOSAGE HAS BEEN CONFIRMED, SUGGEST  FOLLOW UP TESTING OF ANTITHROMBIN III LEVELS.   CBC     Status: Abnormal   Collection Time: 09/20/16  4:05 AM  Result Value Ref  Range   WBC 7.0 4.0 - 10.5 K/uL   RBC 4.51 3.87 - 5.11 MIL/uL   Hemoglobin 9.0 (L) 12.0 - 15.0 g/dL   HCT 29.5 (L) 36.0 - 46.0 %   MCV 65.4 (L) 78.0 - 100.0 fL   MCH 20.0 (L) 26.0 - 34.0 pg   MCHC 30.5 30.0 - 36.0 g/dL   RDW 20.1 (H) 11.5 - 15.5 %   Platelets 235 150 - 400 K/uL  Glucose, capillary     Status: Abnormal   Collection Time: 09/20/16  7:33 AM  Result Value Ref Range   Glucose-Capillary 182 (H) 65 - 99 mg/dL  Glucose, capillary     Status: Abnormal   Collection Time: 09/20/16 11:49 AM  Result Value Ref Range   Glucose-Capillary 172 (H) 65 - 99 mg/dL  POCT Activated clotting time     Status: None   Collection Time: 09/20/16  3:14 PM  Result Value Ref Range   Activated Clotting Time 142 seconds  Glucose, capillary     Status: Abnormal   Collection Time: 09/20/16  5:03 PM  Result Value Ref Range   Glucose-Capillary 146 (H) 65 - 99 mg/dL  Glucose, capillary     Status: Abnormal   Collection Time: 09/20/16  8:33 PM  Result Value Ref Range   Glucose-Capillary 180 (H) 65 - 99 mg/dL  CBC     Status: Abnormal   Collection Time: 09/21/16  4:04 AM  Result Value Ref Range   WBC 7.1 4.0 - 10.5 K/uL   RBC 4.70 3.87 - 5.11 MIL/uL   Hemoglobin 9.4 (L) 12.0 - 15.0 g/dL   HCT 30.6 (L) 36.0 - 46.0 %   MCV 65.1 (L) 78.0 - 100.0 fL   MCH 20.0 (L) 26.0 - 34.0 pg   MCHC 30.7 30.0 - 36.0 g/dL   RDW 19.8 (H) 11.5 - 15.5 %   Platelets 257 150 - 400 K/uL  Glucose, capillary     Status: Abnormal   Collection Time: 09/21/16  7:28 AM  Result Value Ref Range   Glucose-Capillary 187 (H) 65 - 99 mg/dL   Comment 1 Document in Chart   Glucose, capillary     Status: Abnormal   Collection Time: 10/19/16  6:15 AM  Result Value Ref Range   Glucose-Capillary 167 (H) 65 - 99 mg/dL   Comment 1 Notify RN    Comment 2 Document in Chart   Basic metabolic panel     Status: Abnormal   Collection Time: 10/19/16  6:43 AM  Result Value Ref Range   Sodium 139 135 - 145 mmol/L   Potassium 3.7 3.5 -  5.1 mmol/L   Chloride 102 101 - 111 mmol/L   CO2 27 22 - 32 mmol/L   Glucose, Bld 164 (H) 65 - 99 mg/dL   BUN 8 6 - 20 mg/dL   Creatinine, Ser 0.80 0.44 - 1.00 mg/dL   Calcium 8.8 (L) 8.9 - 10.3 mg/dL   GFR calc non Af Amer >60 >60 mL/min   GFR calc Af Amer >60 >60 mL/min    Comment: (NOTE) The eGFR has been calculated using the CKD EPI equation. This calculation has not been validated in all clinical situations. eGFR's persistently <60 mL/min signify possible Chronic Kidney Disease.    Anion gap 10 5 - 15  Hemoglobin  A1c     Status: Abnormal   Collection Time: 10/19/16  6:43 AM  Result Value Ref Range   Hgb A1c MFr Bld 8.1 (H) 4.8 - 5.6 %    Comment: (NOTE)         Pre-diabetes: 5.7 - 6.4         Diabetes: >6.4         Glycemic control for adults with diabetes: <7.0    Mean Plasma Glucose 186 mg/dL    Comment: (NOTE) Performed At: [facility] [place], [place] [id number] Lindon Romp MD [id number]   CBC     Status: Abnormal   Collection Time: 10/19/16  6:43 AM  Result Value Ref Range   WBC 6.1 4.0 - 10.5 K/uL   RBC 5.14 (H) 3.87 - 5.11 MIL/uL   Hemoglobin 10.3 (L) 12.0 - 15.0 g/dL   HCT 33.5 (L) 36.0 - 46.0 %   MCV 65.2 (L) 78.0 - 100.0 fL   MCH 20.0 (L) 26.0 - 34.0 pg   MCHC 30.7 30.0 - 36.0 g/dL   RDW 20.1 (H) 11.5 - 15.5 %   Platelets 237 150 - 400 K/uL  Glucose, capillary     Status: Abnormal   Collection Time: 10/19/16  8:38 AM  Result Value Ref Range   Glucose-Capillary 180 (H) 65 - 99 mg/dL   Comment 1 Notify RN    Comment 2 Document in Chart     Objective: General: Patient is awake, alert, and oriented x 3 and in no acute distress.  Integument: Skin is warm, dry and supple bilateral. Nails are tender, long, thickened and dystrophic with subungual debris, consistent with onychomycosis, 1-5 bilateral. No signs of infection. No open lesions or preulcerative lesions present bilateral. Remaining integument  unremarkable.  Vasculature:  Dorsalis Pedis pulse 1/4 bilateral. Posterior Tibial pulse  1/4 bilateral. Capillary fill time <3 sec 1-5 bilateral. Scant hair growth to the level of the digits.Temperature gradient within normal limits. No varicosities present bilateral. No edema present bilateral.   Neurology: The patient has intact sensation measured with a 5.07/10g Semmes Weinstein Monofilament at all pedal sites bilateral . Vibratory sensation diminished bilateral with tuning fork. No Babinski sign present bilateral. Subjective numbness bilateral.  Musculoskeletal: No symptomatic pedal deformities noted bilateral. Muscular strength 5/5 in all lower extremity muscular groups bilateral without pain on range of motion . No tenderness with calf compression bilateral.  Assessment and Plan: Problem List Items Addressed This Visit    None    Visit Diagnoses    Pain due to onychomycosis of nail    -  Primary   Diabetic polyneuropathy associated with type 2 diabetes mellitus ([place])         -Examined patient. -Discussed and educated patient on diabetic foot care, especially with  regards to the vascular, neurological and musculoskeletal systems.  -Stressed the importance of good glycemic control and the detriment of not controlling glucose levels in relation to the foot. -Mechanically debrided all nails 1-5 bilateral using sterile nail nipper and filed with dremel without incident  -Continued with Gabapentin -Answered all patient questions -Patient to return  in 3 months for at risk foot care -Patient advised to call the office if any problems or questions arise in the meantime.  Landis Martins, DPM

## 2017-01-22 ENCOUNTER — Ambulatory Visit: Payer: Medicaid Other | Admitting: Sports Medicine

## 2017-02-15 ENCOUNTER — Ambulatory Visit (HOSPITAL_COMMUNITY)
Admission: EM | Admit: 2017-02-15 | Discharge: 2017-02-15 | Disposition: A | Discharge status: Home / Self Care | Payer: Medicaid Other | Attending: Family Medicine | Admitting: Family Medicine

## 2017-02-15 ENCOUNTER — Encounter (HOSPITAL_COMMUNITY): Payer: Self-pay | Admitting: Family Medicine

## 2017-02-15 DIAGNOSIS — H6123 Impacted cerumen, bilateral: Secondary | ICD-10-CM | POA: Diagnosis not present

## 2017-02-15 DIAGNOSIS — J302 Other seasonal allergic rhinitis: Secondary | ICD-10-CM

## 2017-02-15 MED ORDER — ANTIPYRINE-BENZOCAINE 5.4-1.4 % OT SOLN: 3.0000 [drp] | OTIC | 0 refills | Status: DC | PRN

## 2017-02-15 MED ORDER — IPRATROPIUM BROMIDE 0.03 % NA SOLN: 2.0000 | Freq: Two times a day (BID) | NASAL | 0 refills | Status: DC

## 2017-02-15 MED ORDER — BENZONATATE 100 MG PO CAPS: 100.0000 mg | ORAL_CAPSULE | Freq: Three times a day (TID) | ORAL | 0 refills | Status: DC

## 2017-02-15 NOTE — ED Provider Notes (Signed)
[place]    CSN: [id number] Arrival date & time: 02/15/17  1002     History   Chief Complaint Chief Complaint  Patient presents with  . Ear Fullness  . URI    HPI KIMBERLIE CSASZAR is a 61 y.o. female with a history of IDDM (last HbA1c 8.1%), CAD, OSA on CPAP, lupus, thyroid disorder (last TSH 1.124) , and chronic bronchitis among others, who presents for right ear fullness, decreased hearing, and URI symptoms.   Symptoms began about 1 month ago with runny nose, throat irritation, right ear fullness and pain/pressure. She was treated with 2 weeks of PCN by her cardiologist, Dr. Doylene Canard, with improvement in ear symptoms. The pain was completely relieved, but she still has fullness and decreased hearing in both ears, R > L, constantly since that time. She denies fevers, chills, dyspnea and chest pain currently. She still has watery nasal discharge for which she has been taking claritin and flonase. She has a nonproductive cough. A month ago she had joint and muscle aches, thought to be related to her lupus, though this has resolved.   HPI  Past Medical History:  Diagnosis Date  . Abnormal findings-skull or head    "/Dr. 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 Anginal pain ([place])    LAST CP TODAY  . Anxiety   . Asthma   . Blood dyscrasia   . CAD (coronary artery disease)   . Cataract   . CHF (congestive heart failure) ([place])   . Chronic bronchitis ([place])    "get it q year"  . Chronic lower back pain   . Colon polyp   . Complication of anesthesia    "couldn't get me woke when they put m3e to sleep for my teeth"  . Dental caries    periodontitis, lesion left mandible  . Depression 11/07   Hospitalization required  . Diverticulosis   . Dyspnea    W/ EXERTION      . Esophageal stricture   . Fibromyalgia   . GERD (gastroesophageal reflux disease)   . Gout   . Headache    "weekly" (08/26/2015)     LITTLE BETTER  09/18/16    . Hemorrhoids   . History of hiatal hernia   . Hyperlipidemia   . Hypertension   . IBS (irritable bowel syndrome)   . Lupus   . Microcytosis   . Myocardial infarction    "slight one years ago"  . Obesity   . OSA on CPAP    CPAP AT NIGHT W/ O2 PRN  . Osteoarthritis    "all over"  . Pneumonia "several times"  . PONV (postoperative nausea and vomiting)   . Seizures ([place])   . Sickle-cell trait ([place])   . Sleep disorder   . Spinal headache   . Thyroid disease    "? high or low" (08/26/2015)  . Type II diabetes mellitus [facility])     Patient Active Problem List   Diagnosis Date Noted  . Chest pain, precordial 09/18/2016    Class: Acute  . Precordial chest pain 09/18/2016  . Acute encephalopathy   . Hyperglycemia 08/26/2015  . Chronic high back pain 08/26/2015  . AKI (acute kidney injury) ([place]) 08/26/2015  . Prolonged Q-T interval on ECG 08/26/2015  . OSA (obstructive sleep apnea) 08/26/2015  . Chest pain at rest 03/09/2014  . Family history of breast cancer 03/23/2013  . Family history of GI  tract cancer 03/23/2013  . Dyspnea 05/02/2012  . Pulmonary infiltrates 05/02/2012  . Cough 05/02/2012  . Family history of malignant neoplasm of gastrointestinal tract 07/25/2011  . Abdominal pain, epigastric 07/25/2011  . UNSPECIFIED IRON DEFICIENCY ANEMIA 03/23/2009  . Arthritis, degenerative 01/05/2009  . HEMORRHOIDS, INTERNAL 10/05/2008  . RHINITIS 01/14/2008  . HYPOTENSION 11/14/2007  . CELLULITIS AND ABSCESS OF TRUNK 11/14/2007  . HYPOKALEMIA 10/28/2007  . UTI 10/28/2007  . DIZZINESS 10/28/2007  . Irritable bowel syndrome 08/13/2007  . EXOPHTHALMOS NOS 05/26/2007  . BILIRUBINURIA 05/26/2007  . Bulging eyes 05/26/2007  . DRUG ABUSE 04/14/2007  . SINUSITIS, MAXILLARY, ACUTE 03/24/2007  . ANXIETY DISORDER, GENERALIZED 03/04/2007  . CALF PAIN 03/04/2007  . BENIGN NEOPLASM [place] DIGESTIVE SYSTEM 01/17/2007  . ESOPHAGEAL STRICTURE 01/17/2007  . HYPERLIPIDEMIA  01/14/2007  . INSOMNIA 12/03/2006  . POLYP, COLON 09/12/2006  . Uncontrolled diabetes mellitus ([place]) 09/12/2006  . Obesity 09/12/2006  . Hypertension 09/12/2006  . Asthma 09/12/2006  . GERD 09/12/2006  . OSTEOARTHRITIS 09/12/2006  . MICROCYTOSIS 09/12/2006  . HYSTERECTOMY, HX OF 09/12/2006  . Essential (primary) hypertension 09/12/2006  . Type 2 diabetes mellitus ([place]) 09/12/2006    Past Surgical History:  Procedure Laterality Date  . ABDOMINAL HYSTERECTOMY    . CARDIAC CATHETERIZATION N/A 09/20/2016   Procedure: Left Heart Cath and Coronary Angiography;  Surgeon: Dixie Dials, MD;  Location: [facility];  Service: Cardiovascular;  Laterality: N/A;  . COLONOSCOPY    . CORONARY ANGIOPLASTY WITH STENT PLACEMENT     "I''ve had alot"  . DILATION AND CURETTAGE OF UTERUS    . ESOPHAGOGASTRODUODENOSCOPY (EGD) WITH ESOPHAGEAL DILATION  "all the time"  . EYE SURGERY Bilateral    "had surgery on the sides of my eyes to keep them popping out"  . head surgery     metal plate placed  . MULTIPLE EXTRACTIONS WITH ALVEOLOPLASTY N/A 10/19/2016   Procedure: MULTIPLE EXTRACTION WITH ALVEOLOPLASTY;  Surgeon: Diona Browner, DDS;  Location: [facility];  Service: Oral Surgery;  Laterality: N/A;  . MULTIPLE TOOTH EXTRACTIONS  ?1980's   "& cut into my jaw"  . TONSILLECTOMY    . TUBAL LIGATION    . UPPER GASTROINTESTINAL ENDOSCOPY      OB History    No data available       Home Medications    Prior to Admission medications   Medication Sig Start Date End Date Taking? Authorizing Provider  albuterol (PROVENTIL,VENTOLIN) 90 MCG/ACT inhaler Inhale 2 puffs into the lungs every 6 (six) hours as needed for shortness of breath. Wheezing or shortness of breath    Historical Provider, MD  ALPRAZolam Duanne Moron) 0.5 MG tablet Take 1 tablet by mouth at bedtime as needed for anxiety or sleep.  09/11/16   Historical Provider, MD  amLODipine (NORVASC) 10 MG tablet Take 10 mg by mouth daily. 09/11/16    Historical Provider, MD  amoxicillin (AMOXIL) 500 MG capsule Take 1 capsule (500 mg total) by mouth 3 (three) times daily. 10/19/16   Diona Browner, DDS  antipyrine-benzocaine Toniann Fail) otic solution Place 3-4 drops into both ears every 2 (two) hours as needed for ear pain. 02/15/17   Patrecia Pour, MD  aspirin 81 MG tablet Take 81 mg by mouth daily.      Historical Provider, MD  benzonatate (TESSALON) 100 MG capsule Take 1 capsule (100 mg total) by mouth every 8 (eight) hours. 02/15/17   Patrecia Pour, MD  citalopram (CELEXA) 10 MG tablet Take 10 mg  by mouth daily.  08/13/16   Historical Provider, MD  colchicine 0.6 MG tablet Take 0.6 mg by mouth daily.  07/16/16   Historical Provider, MD  diclofenac sodium (VOLTAREN) 1 % GEL Apply 2 g topically 4 (four) times daily as needed (pain).     Historical Provider, MD  dicyclomine (BENTYL) 10 MG capsule Take 1 tab 3 times a day before meals. Patient taking differently: Take 10 mg by mouth 4 (four) times daily -  before meals and at bedtime.  08/28/15   Charlynne Cousins, MD  docusate sodium (COLACE) 100 MG capsule Take 1 capsule (100 mg total) by mouth 2 (two) times daily as needed for mild constipation. 09/14/16   Julianne Rice, MD  ergocalciferol (VITAMIN D2) 50000 UNITS capsule Take 1 capsule (50,000 Units total) by mouth once a week. Usually takes on Saturday or fridays 08/28/15   Charlynne Cousins, MD  famotidine (PEPCID) 20 MG tablet Take 20 mg by mouth daily.    Historical Provider, MD  fenofibrate (TRICOR) 145 MG tablet Take 1 tablet (145 mg total) by mouth daily. 08/28/15   Charlynne Cousins, MD  ferrous sulfate 325 (65 FE) MG tablet Take 1 tablet (325 mg total) by mouth daily with breakfast. 08/28/15   Charlynne Cousins, MD  fluticasone [facility]) 50 MCG/ACT nasal spray Place 1 spray into both nostrils 2 (two) times daily. 08/28/15   Charlynne Cousins, MD  fluticasone-salmeterol (ADVAIR HFA) 5403682719 MCG/ACT inhaler Inhale 2 puffs into the  lungs 2 (two) times daily. 08/28/15   Charlynne Cousins, MD  gabapentin (NEURONTIN) 300 MG capsule Take 1 capsule (300 mg total) by mouth at bedtime. 04/10/16   Landis Martins, DPM  HYDROcodone-acetaminophen (NORCO) 10-325 MG tablet Take 1-2 tablets by mouth every 6 (six) hours as needed. 10/19/16   Diona Browner, DDS  insulin NPH-regular Human (NOVOLIN 70/30) (70-30) 100 UNIT/ML injection Inject 60 Units into the skin daily with breakfast. Patient taking differently: Inject 60 Units into the skin 2 (two) times daily.  08/28/15   Charlynne Cousins, MD  ipratropium (ATROVENT) 0.02 % nebulizer solution Take 2.5 mLs (0.5 mg total) by nebulization 3 (three) times daily as needed for wheezing or shortness of breath. 08/28/15   Charlynne Cousins, MD  ipratropium (ATROVENT) 0.03 % nasal spray Place 2 sprays into both nostrils every 12 (twelve) hours. 02/15/17   Patrecia Pour, MD  Lancets MISC by Does not apply route.      Historical Provider, MD  loratadine (CLARITIN) 10 MG tablet Take 10 mg by mouth daily.     Historical Provider, MD  metFORMIN (GLUCOPHAGE) 500 MG tablet Take 2 tablets (1,000 mg total) by mouth 2 (two) times daily with a meal. Patient taking differently: Take 500 mg by mouth 2 (two) times daily with a meal.  08/28/15   Charlynne Cousins, MD  methocarbamol (ROBAXIN) 500 MG tablet Take 1 tablet (500 mg total) by mouth 2 (two) times daily. 03/22/14   Antonietta Breach, PA-C  metoprolol (LOPRESSOR) 100 MG tablet Take 100 mg by mouth 2 (two) times daily. 10/02/16   Historical Provider, MD  metoprolol (LOPRESSOR) 50 MG tablet Take 1 tablet (50 mg total) by mouth 2 (two) times daily. Patient not taking: Reported on 10/19/2016 08/28/15   Charlynne Cousins, MD  nitroGLYCERIN (NITROSTAT) 0.4 MG SL tablet Place 0.4 mg under the tongue every 5 (five) minutes as needed for chest pain.  09/07/16   Historical Provider, MD  ondansetron (  ZOFRAN ODT) 4 MG disintegrating tablet 4mg  ODT q4 hours prn  nausea/vomit Patient taking differently: Take 4 mg by mouth every 8 (eight) hours as needed for nausea or vomiting. 4mg  ODT q4 hours prn nausea/vomit 01/26/16   Deno Etienne, DO  pantoprazole (PROTONIX) 40 MG tablet Take 1 tablet (40 mg total) by mouth daily. 30 mins prior to breakfast. 12/21/15   Lori P Hvozdovic, PA-C  polyethylene glycol (MIRALAX / GLYCOLAX) packet Take 17 g by mouth daily. 09/14/16   Julianne Rice, MD  potassium chloride (K-DUR) 10 MEQ tablet Take 1 tablet (10 mEq total) by mouth 3 (three) times daily. 08/28/15   Charlynne Cousins, MD  promethazine (PHENERGAN) 25 MG tablet Take 0.5 tablets (12.5 mg total) by mouth every 6 (six) hours as needed for nausea or vomiting. Patient taking differently: Take 25 mg by mouth every 6 (six) hours as needed for nausea or vomiting.  03/11/14   Dixie Dials, MD  RESTASIS 0.05 % ophthalmic emulsion Place 1 drop into both eyes 2 (two) times daily.  09/10/16   Historical Provider, MD  simvastatin (ZOCOR) 40 MG tablet Take 0.5 tablets (20 mg total) by mouth at bedtime. 08/28/15   Charlynne Cousins, MD  tobramycin (TOBREX) 0.3 % ophthalmic ointment Place 1 application into both eyes 4 (four) times daily.     Historical Provider, MD  travoprost, benzalkonium, (TRAVATAN) 0.004 % ophthalmic solution Place 1 drop into both eyes at bedtime. 08/28/15   Charlynne Cousins, MD    Family History Family History  Problem Relation Age of Onset  . Rectal cancer Sister   . Lung cancer Father   . Esophageal cancer Father   . Breast cancer Sister 70  . Breast cancer Mother 38  . Colon cancer Other     Aunt, Uncle  . Liver cancer Brother     Nephew  . Diabetes Sister   . Diabetes Brother   . Diabetes Other     son & daughter  . Heart disease Sister   . Heart disease Brother   . Kidney disease Sister   . Breast cancer Maternal Aunt     patient reports 11 mat aunts with breast cancer  . Breast cancer Paternal Aunt 85  . Breast cancer Maternal  Grandmother     unk age    Social History Social History  Substance Use Topics  . Smoking status: Never Smoker  . Smokeless tobacco: Never Used  . Alcohol use No     Allergies   Ace inhibitors; Latex; and Percocet [oxycodone-acetaminophen]   Review of Systems Review of Systems As above per HPI.   Physical Exam Triage Vital Signs ED Triage Vitals [02/15/17 1025]  Enc Vitals Group     BP 140/86     Pulse Rate 72     Resp 18     Temp 98.4 F (36.9 C)     Temp src      SpO2 98 %     Weight      Height      Head Circumference      Peak Flow      Pain Score      Pain Loc      Pain Edu?      Excl. in [place]?    No data found.   Updated Vital Signs BP 140/86   Pulse 72   Temp 98.4 F (36.9 C)   Resp 18   SpO2 98%   Visual Acuity Right  Eye Distance:   Left Eye Distance:   Bilateral Distance:    Right Eye Near:   Left Eye Near:    Bilateral Near:     Physical Exam  Constitutional: She is oriented to person, place, and time. She appears well-developed and well-nourished. No distress.  HENT:  Head: Normocephalic and atraumatic.  Mouth/Throat: Oropharynx is clear and moist.  black crusting nonocclusive cerumen bilaterally R > L. With normal grey TM. +LR. Mild oropharyngeal erythema with +PND. Turbinates boggy and nonocclusive bilaterally.   Eyes: Conjunctivae are normal.  Marked proptosis without lid lag. EOM full, PERRL. No discharge.   Neck: Normal range of motion. Neck supple.  Cardiovascular: Normal rate and regular rhythm.   No murmur heard. Pulmonary/Chest: Effort normal and breath sounds normal. No respiratory distress. She has no wheezes.  Abdominal: Soft. Bowel sounds are normal. There is no tenderness.  Musculoskeletal: She exhibits no edema.  Neurological: She is alert and oriented to person, place, and time.  Skin: Skin is warm and dry. Capillary refill takes less than 2 seconds.  Psychiatric: She has a normal mood and affect.  Nursing note and  vitals reviewed.    UC Treatments / Results  Labs (all labs ordered are listed, but only abnormal results are displayed) Labs Reviewed - No data to display  EKG  EKG Interpretation None       Radiology No results found.  Procedures Procedures (including critical care time)  Medications Ordered in UC Medications - No data to display   Initial Impression / Assessment and Plan / UC Course  I have reviewed the triage vital signs and the nursing notes.  Pertinent labs & imaging results that were available during my care of the patient were reviewed by me and considered in my medical decision making (see chart for details).  Final Clinical Impressions(s) / UC Diagnoses   Final diagnoses:  Bilateral impacted cerumen  Seasonal allergic rhinitis, unspecified chronicity, unspecified trigger   61 y.o. female with multiple comorbidities without evidence of flare presenting for ear fullness attributable to cerumen impactions and URI symptoms without fever which will be treated as seasonal allergies by adding atrovent nasal solution to current regimen of po antihistamine and IN steroid.  Ears irrigated today with incomplete resolution of occlusion. Pt reports some improvement in sensation of fullness. Will add auralgan gtt bilaterally and recommend follow up in 1 week for repeat irrigation. Return precautions provided.   New Prescriptions New Prescriptions   ANTIPYRINE-BENZOCAINE (AURALGAN) OTIC SOLUTION    Place 3-4 drops into both ears every 2 (two) hours as needed for ear pain.   BENZONATATE (TESSALON) 100 MG CAPSULE    Take 1 capsule (100 mg total) by mouth every 8 (eight) hours.   IPRATROPIUM (ATROVENT) 0.03 % NASAL SPRAY    Place 2 sprays into both nostrils every 12 (twelve) hours.     Patrecia Pour, MD 02/15/17 [phone]

## 2017-02-15 NOTE — ED Triage Notes (Signed)
Pt here for fullness in right ear and pain. sts she was treated for an ear infection 3 weeks ago and not better. sts also some weakness and SOB. sts she is on home o2. sts wheezing/

## 2017-02-15 NOTE — Discharge Instructions (Signed)
Use the atrovent nasal spray twice daily to help with nasal drainage and throat irritation as well as cough. This is in addition to flonase and other medications you are already taking.   Use the ear drops as directed to help ease irritation and to soften the earwax. Return to your PCP for repeat irrigation in the next week. If your symptoms worsen, seek medical attention right away.

## 2017-03-11 ENCOUNTER — Encounter (HOSPITAL_COMMUNITY): Payer: Self-pay | Admitting: Emergency Medicine

## 2017-03-11 ENCOUNTER — Emergency Department (HOSPITAL_COMMUNITY)
Admission: EM | Admit: 2017-03-11 | Discharge: 2017-03-11 | Disposition: A | Discharge status: Home / Self Care | Payer: Medicaid Other | Attending: Emergency Medicine | Admitting: Emergency Medicine

## 2017-03-11 DIAGNOSIS — I110 Hypertensive heart disease with heart failure: Secondary | ICD-10-CM | POA: Insufficient documentation

## 2017-03-11 DIAGNOSIS — M791 Myalgia, unspecified site: Secondary | ICD-10-CM

## 2017-03-11 DIAGNOSIS — Z79899 Other long term (current) drug therapy: Secondary | ICD-10-CM | POA: Insufficient documentation

## 2017-03-11 DIAGNOSIS — Z794 Long term (current) use of insulin: Secondary | ICD-10-CM | POA: Insufficient documentation

## 2017-03-11 DIAGNOSIS — I252 Old myocardial infarction: Secondary | ICD-10-CM | POA: Insufficient documentation

## 2017-03-11 DIAGNOSIS — R112 Nausea with vomiting, unspecified: Secondary | ICD-10-CM

## 2017-03-11 DIAGNOSIS — I509 Heart failure, unspecified: Secondary | ICD-10-CM | POA: Diagnosis not present

## 2017-03-11 DIAGNOSIS — J45909 Unspecified asthma, uncomplicated: Secondary | ICD-10-CM | POA: Diagnosis not present

## 2017-03-11 DIAGNOSIS — R197 Diarrhea, unspecified: Secondary | ICD-10-CM | POA: Diagnosis not present

## 2017-03-11 DIAGNOSIS — E119 Type 2 diabetes mellitus without complications: Secondary | ICD-10-CM | POA: Diagnosis not present

## 2017-03-11 DIAGNOSIS — Z955 Presence of coronary angioplasty implant and graft: Secondary | ICD-10-CM | POA: Diagnosis not present

## 2017-03-11 DIAGNOSIS — Z8509 Personal history of malignant neoplasm of other digestive organs: Secondary | ICD-10-CM | POA: Diagnosis not present

## 2017-03-11 DIAGNOSIS — Z91040 Latex allergy status: Secondary | ICD-10-CM | POA: Insufficient documentation

## 2017-03-11 DIAGNOSIS — I2510 Atherosclerotic heart disease of native coronary artery without angina pectoris: Secondary | ICD-10-CM | POA: Diagnosis not present

## 2017-03-11 LAB — URINALYSIS, ROUTINE W REFLEX MICROSCOPIC
Bilirubin Urine: NEGATIVE
Glucose, UA: NEGATIVE mg/dL
Hgb urine dipstick: NEGATIVE
KETONES UR: NEGATIVE mg/dL
Nitrite: NEGATIVE
PROTEIN: 100 mg/dL — AB
Specific Gravity, Urine: 1.026 (ref 1.005–1.030)
pH: 5 (ref 5.0–8.0)

## 2017-03-11 LAB — COMPREHENSIVE METABOLIC PANEL
ALBUMIN: 4.3 g/dL (ref 3.5–5.0)
ALT: 12 U/L — AB (ref 14–54)
AST: 9 U/L — AB (ref 15–41)
Alkaline Phosphatase: 65 U/L (ref 38–126)
Anion gap: 10 (ref 5–15)
BUN: 13 mg/dL (ref 6–20)
CHLORIDE: 105 mmol/L (ref 101–111)
CO2: 25 mmol/L (ref 22–32)
CREATININE: 0.9 mg/dL (ref 0.44–1.00)
Calcium: 8.6 mg/dL — ABNORMAL LOW (ref 8.9–10.3)
GFR calc Af Amer: >60 mL/min (ref >60)
GLUCOSE: 152 mg/dL — AB (ref 65–99)
Potassium: 3.7 mmol/L (ref 3.5–5.1)
Sodium: 140 mmol/L (ref 135–145)
Total Bilirubin: 0.4 mg/dL (ref 0.3–1.2)
Total Protein: 8.7 g/dL — ABNORMAL HIGH (ref 6.5–8.1)

## 2017-03-11 LAB — CBC
HCT: 33.6 % — ABNORMAL LOW (ref 36.0–46.0)
Hemoglobin: 10.2 g/dL — ABNORMAL LOW (ref 12.0–15.0)
MCH: 19.9 pg — AB (ref 26.0–34.0)
MCHC: 30.4 g/dL (ref 30.0–36.0)
MCV: 65.5 fL — ABNORMAL LOW (ref 78.0–100.0)
PLATELETS: 318 10*3/uL (ref 150–400)
RBC: 5.13 MIL/uL — ABNORMAL HIGH (ref 3.87–5.11)
RDW: 20.5 % — AB (ref 11.5–15.5)
WBC: 10.4 10*3/uL (ref 4.0–10.5)

## 2017-03-11 LAB — LIPASE, BLOOD: LIPASE: 16 U/L (ref 11–51)

## 2017-03-11 NOTE — ED Provider Notes (Signed)
[facility] DEPT Provider Note   CSN: [id number] Arrival date & time: 03/11/17  1850     History   Chief Complaint Chief Complaint  Patient presents with  . Medication Reaction    HPI Chelsea Lester is a 61 y.o. female.  The history is provided by the patient.  GI Problem  This is a new problem. Episode onset: 3 days. The problem occurs daily. The problem has not changed since onset.Pertinent negatives include no chest pain, no abdominal pain, no headaches and no shortness of breath. Nothing aggravates the symptoms. Nothing relieves the symptoms. She has tried nothing for the symptoms. The treatment provided no relief.   Reports 2 weeks of myalgia with  3 days of N/V/D. Pain clinic placed her suboxone last week.  Also reports that husband had similar sx last week.    Past Medical History:  Diagnosis Date  . Abnormal findings-skull or head    "/Dr. 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 Anginal pain ([place])    LAST CP TODAY  . Anxiety   . Asthma   . Blood dyscrasia   . CAD (coronary artery disease)   . Cataract   . CHF (congestive heart failure) ([place])   . Chronic bronchitis ([place])    "get it q year"  . Chronic lower back pain   . Colon polyp   . Complication of anesthesia    "couldn't get me woke when they put m3e to sleep for my teeth"  . Dental caries    periodontitis, lesion left mandible  . Depression 11/07   Hospitalization required  . Diverticulosis   . Dyspnea    W/ EXERTION      . Esophageal stricture   . Fibromyalgia   . GERD (gastroesophageal reflux disease)   . Gout   . Headache    "weekly" (08/26/2015)     LITTLE BETTER  09/18/16  . Hemorrhoids   . History of hiatal hernia   . Hyperlipidemia   . Hypertension   . IBS (irritable bowel syndrome)   . Lupus   . Microcytosis   . Myocardial infarction ([place])    "slight one years ago"  . Obesity   . OSA on CPAP    CPAP AT NIGHT W/ O2 PRN  .  Osteoarthritis    "all over"  . Pneumonia "several times"  . PONV (postoperative nausea and vomiting)   . Seizures ([place])   . Sickle-cell trait ([place])   . Sleep disorder   . Spinal headache   . Thyroid disease    "? high or low" (08/26/2015)  . Type II diabetes mellitus [facility])     Patient Active Problem List   Diagnosis Date Noted  . Chest pain, precordial 09/18/2016    Class: Acute  . Precordial chest pain 09/18/2016  . Acute encephalopathy   . Hyperglycemia 08/26/2015  . Chronic high back pain 08/26/2015  . AKI (acute kidney injury) ([place]) 08/26/2015  . Prolonged Q-T interval on ECG 08/26/2015  . OSA (obstructive sleep apnea) 08/26/2015  . Chest pain at rest 03/09/2014  . Family history of breast cancer 03/23/2013  . Family history of GI tract cancer 03/23/2013  . Dyspnea 05/02/2012  . Pulmonary infiltrates 05/02/2012  . Cough 05/02/2012  . Family history of malignant neoplasm of gastrointestinal tract 07/25/2011  . Abdominal pain, epigastric 07/25/2011  . UNSPECIFIED IRON DEFICIENCY ANEMIA 03/23/2009  . Arthritis, degenerative 01/05/2009  . HEMORRHOIDS, INTERNAL 10/05/2008  .  RHINITIS 01/14/2008  . HYPOTENSION 11/14/2007  . CELLULITIS AND ABSCESS OF TRUNK 11/14/2007  . HYPOKALEMIA 10/28/2007  . UTI 10/28/2007  . DIZZINESS 10/28/2007  . Irritable bowel syndrome 08/13/2007  . EXOPHTHALMOS NOS 05/26/2007  . BILIRUBINURIA 05/26/2007  . Bulging eyes 05/26/2007  . DRUG ABUSE 04/14/2007  . SINUSITIS, MAXILLARY, ACUTE 03/24/2007  . ANXIETY DISORDER, GENERALIZED 03/04/2007  . CALF PAIN 03/04/2007  . BENIGN NEOPLASM [place] DIGESTIVE SYSTEM 01/17/2007  . ESOPHAGEAL STRICTURE 01/17/2007  . HYPERLIPIDEMIA 01/14/2007  . INSOMNIA 12/03/2006  . POLYP, COLON 09/12/2006  . Uncontrolled diabetes mellitus ([place]) 09/12/2006  . Obesity 09/12/2006  . Hypertension 09/12/2006  . Asthma 09/12/2006  . GERD 09/12/2006  . OSTEOARTHRITIS 09/12/2006  . MICROCYTOSIS 09/12/2006  .  HYSTERECTOMY, HX OF 09/12/2006  . Essential (primary) hypertension 09/12/2006  . Type 2 diabetes mellitus ([place]) 09/12/2006    Past Surgical History:  Procedure Laterality Date  . ABDOMINAL HYSTERECTOMY    . CARDIAC CATHETERIZATION N/A 09/20/2016   Procedure: Left Heart Cath and Coronary Angiography;  Surgeon: Dixie Dials, MD;  Location: [facility];  Service: Cardiovascular;  Laterality: N/A;  . COLONOSCOPY    . CORONARY ANGIOPLASTY WITH STENT PLACEMENT     "I''ve had alot"  . DILATION AND CURETTAGE OF UTERUS    . ESOPHAGOGASTRODUODENOSCOPY (EGD) WITH ESOPHAGEAL DILATION  "all the time"  . EYE SURGERY Bilateral    "had surgery on the sides of my eyes to keep them popping out"  . head surgery     metal plate placed  . MULTIPLE EXTRACTIONS WITH ALVEOLOPLASTY N/A 10/19/2016   Procedure: MULTIPLE EXTRACTION WITH ALVEOLOPLASTY;  Surgeon: Diona Browner, DDS;  Location: [facility];  Service: Oral Surgery;  Laterality: N/A;  . MULTIPLE TOOTH EXTRACTIONS  ?1980's   "& cut into my jaw"  . TONSILLECTOMY    . TUBAL LIGATION    . UPPER GASTROINTESTINAL ENDOSCOPY      OB History    No data available       Home Medications    Prior to Admission medications   Medication Sig Start Date End Date Taking? Authorizing Provider  albuterol (PROVENTIL,VENTOLIN) 90 MCG/ACT inhaler Inhale 2 puffs into the lungs every 6 (six) hours as needed for shortness of breath. Wheezing or shortness of breath   Yes [provider]  amLODipine (NORVASC) 10 MG tablet Take 10 mg by mouth every morning.  09/11/16  Yes [provider]  antipyrine-benzocaine Toniann Fail) otic solution Place 3-4 drops into both ears every 2 (two) hours as needed for ear pain. 02/15/17  Yes Patrecia Pour, MD  aspirin 81 MG tablet Take 81 mg by mouth every morning.    Yes [provider]  baclofen (LIORESAL) 20 MG tablet Take 20 mg by mouth 2 (two) times daily. 03/06/17  Yes [provider]  benzonatate  (TESSALON) 100 MG capsule Take 1 capsule (100 mg total) by mouth every 8 (eight) hours. Patient taking differently: Take 100 mg by mouth 3 (three) times daily as needed.  02/15/17  Yes Patrecia Pour, MD  citalopram (CELEXA) 10 MG tablet Take 10 mg by mouth every morning.  08/13/16  Yes [provider]  colchicine 0.6 MG tablet Take 0.6 mg by mouth every morning.  07/16/16  Yes [provider]  COMBIGAN 0.2-0.5 % ophthalmic solution Place 1 drop into both eyes every 12 (twelve) hours.  02/19/17  Yes [provider]  diclofenac sodium (VOLTAREN) 1 % GEL Apply 2 g topically  4 (four) times daily as needed (pain).    Yes [provider]  dicyclomine (BENTYL) 10 MG capsule Take 1 tab 3 times a day before meals. Patient taking differently: Take 10 mg by mouth 3 (three) times daily as needed for spasms.  08/28/15  Yes Charlynne Cousins, MD  docusate sodium (COLACE) 100 MG capsule Take 1 capsule (100 mg total) by mouth 2 (two) times daily as needed for mild constipation. 09/14/16  Yes Julianne Rice, MD  ergocalciferol (VITAMIN D2) 50000 UNITS capsule Take 1 capsule (50,000 Units total) by mouth once a week. Usually takes on Saturday or fridays 08/28/15  Yes Charlynne Cousins, MD  famotidine (PEPCID) 20 MG tablet Take 20 mg by mouth every morning.    Yes [provider]  fenofibrate (TRICOR) 145 MG tablet Take 1 tablet (145 mg total) by mouth daily. 08/28/15  Yes Charlynne Cousins, MD  ferrous sulfate 325 (65 FE) MG tablet Take 1 tablet (325 mg total) by mouth daily with breakfast. 08/28/15  Yes Charlynne Cousins, MD  fluticasone [facility]) 50 MCG/ACT nasal spray Place 1 spray into both nostrils 2 (two) times daily. 08/28/15  Yes Charlynne Cousins, MD  fluticasone-salmeterol (ADVAIR HFA) 250-305-0641 MCG/ACT inhaler Inhale 2 puffs into the lungs 2 (two) times daily. 08/28/15  Yes Charlynne Cousins, MD  gabapentin (NEURONTIN) 300 MG capsule Take 1 capsule  (300 mg total) by mouth at bedtime. Patient taking differently: Take 300-600 mg by mouth 2 (two) times daily as needed (nerve pain).  04/10/16  Yes Stover, Titorya, DPM  insulin NPH-regular Human (NOVOLIN 70/30) (70-30) 100 UNIT/ML injection Inject 60 Units into the skin daily with breakfast. Patient taking differently: Inject 60 Units into the skin 2 (two) times daily.  08/28/15  Yes Charlynne Cousins, MD  ipratropium (ATROVENT) 0.02 % nebulizer solution Take 2.5 mLs (0.5 mg total) by nebulization 3 (three) times daily as needed for wheezing or shortness of breath. 08/28/15  Yes Charlynne Cousins, MD  ipratropium (ATROVENT) 0.03 % nasal spray Place 2 sprays into both nostrils every 12 (twelve) hours. Patient taking differently: Place 2 sprays into both nostrils 4 (four) times daily.  02/15/17  Yes Patrecia Pour, MD  Lancets MISC by Does not apply route.     Yes [provider]  loratadine (CLARITIN) 10 MG tablet Take 10 mg by mouth every morning.    Yes [provider]  metFORMIN (GLUCOPHAGE) 500 MG tablet Take 2 tablets (1,000 mg total) by mouth 2 (two) times daily with a meal. Patient taking differently: Take 500 mg by mouth 2 (two) times daily with a meal.  08/28/15  Yes Charlynne Cousins, MD  methocarbamol (ROBAXIN) 500 MG tablet Take 1 tablet (500 mg total) by mouth 2 (two) times daily. 03/22/14  Yes Antonietta Breach, PA-C  metoprolol (LOPRESSOR) 100 MG tablet Take 100 mg by mouth 2 (two) times daily. 10/02/16  Yes [provider]  nitroGLYCERIN (NITROSTAT) 0.4 MG SL tablet Place 0.4 mg under the tongue every 5 (five) minutes as needed for chest pain.  09/07/16  Yes [provider]  omeprazole (PRILOSEC) 20 MG capsule Take 20 mg by mouth every morning. 03/06/17  Yes [provider]  ondansetron (ZOFRAN ODT) 4 MG disintegrating tablet 4mg  ODT q4 hours prn nausea/vomit Patient taking differently: Take 4 mg by mouth every 8 (eight) hours as needed for  nausea or vomiting. 4mg  ODT q4 hours prn nausea/vomit 01/26/16  Yes Deno Etienne, DO  pantoprazole (PROTONIX) 40 MG tablet Take 1 tablet (40 mg total) by mouth daily. 30 mins prior to breakfast. 12/21/15  Yes Hvozdovic, Lori P, PA-C  polyethylene glycol (MIRALAX / GLYCOLAX) packet Take 17 g by mouth daily. Patient taking differently: Take 17 g by mouth every morning.  09/14/16  Yes Julianne Rice, MD  potassium chloride (K-DUR) 10 MEQ tablet Take 1 tablet (10 mEq total) by mouth 3 (three) times daily. 08/28/15  Yes Charlynne Cousins, MD  promethazine (PHENERGAN) 25 MG tablet Take 0.5 tablets (12.5 mg total) by mouth every 6 (six) hours as needed for nausea or vomiting. Patient taking differently: Take 25 mg by mouth every 6 (six) hours as needed for nausea or vomiting.  03/11/14  Yes Dixie Dials, MD  RESTASIS 0.05 % ophthalmic emulsion Place 1 drop into both eyes 2 (two) times daily.  09/10/16  Yes [provider]  simvastatin (ZOCOR) 40 MG tablet Take 0.5 tablets (20 mg total) by mouth at bedtime. Patient taking differently: Take 40 mg by mouth at bedtime.  08/28/15  Yes Charlynne Cousins, MD  SUBOXONE 8-2 MG FILM Take 1 Film by mouth 2 (two) times daily. 03/08/17  Yes [provider]  tobramycin (TOBREX) 0.3 % ophthalmic ointment Place 1 application into both eyes 4 (four) times daily.    Yes [provider]  travoprost, benzalkonium, (TRAVATAN) 0.004 % ophthalmic solution Place 1 drop into both eyes at bedtime. 08/28/15  Yes Charlynne Cousins, MD  amoxicillin (AMOXIL) 500 MG capsule Take 1 capsule (500 mg total) by mouth 3 (three) times daily. Patient not taking: Reported on 03/11/2017 10/19/16   Diona Browner, DDS  HYDROcodone-acetaminophen [facility]) 10-325 MG tablet Take 1-2 tablets by mouth every 6 (six) hours as needed. Patient not taking: Reported on 03/11/2017 10/19/16   Diona Browner, DDS  metoprolol (LOPRESSOR) 50 MG tablet Take 1 tablet (50 mg total) by mouth 2  (two) times daily. Patient not taking: Reported on 10/19/2016 08/28/15   Charlynne Cousins, MD    Family History Family History  Problem Relation Age of Onset  . Rectal cancer Sister   . Lung cancer Father   . Esophageal cancer Father   . Breast cancer Sister 87  . Breast cancer Mother 78  . Colon cancer Other     Aunt, Uncle  . Liver cancer Brother     Nephew  . Diabetes Sister   . Diabetes Brother   . Diabetes Other     son & daughter  . Heart disease Sister   . Heart disease Brother   . Kidney disease Sister   . Breast cancer Maternal Aunt     patient reports 11 mat aunts with breast cancer  . Breast cancer Paternal Aunt 85  . Breast cancer Maternal Grandmother     unk age    Social History Social History  Substance Use Topics  . Smoking status: Never Smoker  . Smokeless tobacco: Never Used  . Alcohol use No     Allergies   Ace inhibitors; Latex; and Percocet [oxycodone-acetaminophen]   Review of Systems Review of Systems  Respiratory: Negative for shortness of breath.   Cardiovascular: Negative for chest pain.  Gastrointestinal: Negative for abdominal pain.  Neurological: Negative for headaches.   All other systems are reviewed and are negative for acute change except as noted in the HPI   Physical Exam Updated Vital Signs BP (!) 153/100 (BP Location: Right Arm)   Pulse 77   Resp  20   SpO2 100%   Physical Exam  Constitutional: She is oriented to person, place, and time. She appears well-developed and well-nourished. No distress.  HENT:  Head: Normocephalic and atraumatic.  Nose: Nose normal.  Eyes: Conjunctivae and EOM are normal. Pupils are equal, round, and reactive to light. Right eye exhibits no discharge. Left eye exhibits no discharge. No scleral icterus.  Neck: Normal range of motion. Neck supple.  Cardiovascular: Normal rate and regular rhythm.  Exam reveals no gallop and no friction rub.   No murmur heard. Pulmonary/Chest: Effort  normal and breath sounds normal. No stridor. No respiratory distress. She has no rales.  Abdominal: Soft. She exhibits no distension. There is no tenderness.  Musculoskeletal: She exhibits no edema or tenderness.  Neurological: She is alert and oriented to person, place, and time.  Skin: Skin is warm and dry. No rash noted. She is not diaphoretic. No erythema.  Psychiatric: She has a normal mood and affect.  Vitals reviewed.    ED Treatments / Results  Labs (all labs ordered are listed, but only abnormal results are displayed) Labs Reviewed  COMPREHENSIVE METABOLIC PANEL - Abnormal; Notable for the following:       Result Value   Glucose, Bld 152 (*)    Calcium 8.6 (*)    Total Protein 8.7 (*)    AST 9 (*)    ALT 12 (*)    All other components within normal limits  CBC - Abnormal; Notable for the following:    RBC 5.13 (*)    Hemoglobin 10.2 (*)    HCT 33.6 (*)    MCV 65.5 (*)    MCH 19.9 (*)    RDW 20.5 (*)    All other components within normal limits  URINALYSIS, ROUTINE W REFLEX MICROSCOPIC - Abnormal; Notable for the following:    APPearance HAZY (*)    Protein, ur 100 (*)    Leukocytes, UA LARGE (*)    Bacteria, UA RARE (*)    Squamous Epithelial / LPF 0-5 (*)    Non Squamous Epithelial 0-5 (*)    All other components within normal limits  LIPASE, BLOOD    EKG  EKG Interpretation None       Radiology No results found.  Procedures Procedures (including critical care time)  Medications Ordered in ED Medications - No data to display   Initial Impression / Assessment and Plan / ED Course  I have reviewed the triage vital signs and the nursing notes.  Pertinent labs & imaging results that were available during my care of the patient were reviewed by me and considered in my medical decision making (see chart for details).     Possible opiate withdrawal versus gastroenteritis. Abdomen benign on exam. Labs grossly reassuring without evidence of  pancreatitis or biliary obstruction. Low suspicion for small bowel obstruction, appendicitis, diverticulitis. Do not feel that advanced imaging is necessary at this time.  Patient was able to tolerate by mouth in the emergency department without any nausea medication.  Feel she is appropriate for discharge with strict return precautions and close PCP follow-up.  Final Clinical Impressions(s) / ED Diagnoses   Final diagnoses:  Nausea vomiting and diarrhea  Myalgia   Disposition: Discharge  Condition: Good  I have discussed the results, Dx and Tx plan with the patient who expressed understanding and agree(s) with the plan. Discharge instructions discussed at great length. The patient was given strict return precautions who verbalized understanding of the instructions. No  further questions at time of discharge.    New Prescriptions   No medications on file    Follow Up: Nolene Ebbs, MD [place]  [id number] [phone]  Schedule an appointment as soon as possible for a visit  in 3-5 days, If symptoms do not improve or  worsen      Shamica Moree, Grayce Sessions, MD 03/11/17 2241

## 2017-03-11 NOTE — ED Notes (Signed)
Writer gave pt a regular ginger ale and water for oral hydration

## 2017-03-11 NOTE — ED Notes (Signed)
Pt tolerating PO fluids well

## 2017-03-11 NOTE — ED Triage Notes (Signed)
Per EMS pt complaint of n/v/d, itchiness, and tiredness for 3-4 post suboxone use.

## 2017-04-09 ENCOUNTER — Ambulatory Visit (INDEPENDENT_AMBULATORY_CARE_PROVIDER_SITE_OTHER): Payer: Medicaid Other | Admitting: Sports Medicine

## 2017-04-09 ENCOUNTER — Encounter: Payer: Self-pay | Admitting: Sports Medicine

## 2017-04-09 DIAGNOSIS — E1142 Type 2 diabetes mellitus with diabetic polyneuropathy: Secondary | ICD-10-CM

## 2017-04-09 DIAGNOSIS — B351 Tinea unguium: Secondary | ICD-10-CM

## 2017-04-09 DIAGNOSIS — M79676 Pain in unspecified toe(s): Secondary | ICD-10-CM

## 2017-04-09 DIAGNOSIS — M79609 Pain in unspecified limb: Principal | ICD-10-CM

## 2017-04-09 NOTE — Progress Notes (Signed)
Patient ID: Chelsea Lester, female   DOB: 1956/05/21, 61 y.o.   MRN: [id number] Subjective: Chelsea Lester is a 61 y.o. female patient with history of diabetes who returns to office today complaining of long, painful nails  while ambulating in shoes; unable to trim. Patient states that the glucose reading this morning was so-so. Patient denies any new changes in medication or new problems.Patient denies any new cramping, numbness, burning or tingling in the legs. Patient has no other pedal complaints.  Patient Active Problem List   Diagnosis Date Noted  . Chest pain, precordial 09/18/2016    Class: Acute  . Precordial chest pain 09/18/2016  . Acute encephalopathy   . Hyperglycemia 08/26/2015  . Chronic high back pain 08/26/2015  . AKI (acute kidney injury) ([place]) 08/26/2015  . Prolonged Q-T interval on ECG 08/26/2015  . OSA (obstructive sleep apnea) 08/26/2015  . Chest pain at rest 03/09/2014  . Family history of breast cancer 03/23/2013  . Family history of GI tract cancer 03/23/2013  . Dyspnea 05/02/2012  . Pulmonary infiltrates 05/02/2012  . Cough 05/02/2012  . Family history of malignant neoplasm of gastrointestinal tract 07/25/2011  . Abdominal pain, epigastric 07/25/2011  . UNSPECIFIED IRON DEFICIENCY ANEMIA 03/23/2009  . Arthritis, degenerative 01/05/2009  . HEMORRHOIDS, INTERNAL 10/05/2008  . RHINITIS 01/14/2008  . HYPOTENSION 11/14/2007  . CELLULITIS AND ABSCESS OF TRUNK 11/14/2007  . HYPOKALEMIA 10/28/2007  . UTI 10/28/2007  . DIZZINESS 10/28/2007  . Irritable bowel syndrome 08/13/2007  . EXOPHTHALMOS NOS 05/26/2007  . BILIRUBINURIA 05/26/2007  . Bulging eyes 05/26/2007  . DRUG ABUSE 04/14/2007  . SINUSITIS, MAXILLARY, ACUTE 03/24/2007  . ANXIETY DISORDER, GENERALIZED 03/04/2007  . CALF PAIN 03/04/2007  . BENIGN NEOPLASM [place] DIGESTIVE SYSTEM 01/17/2007  . ESOPHAGEAL STRICTURE 01/17/2007  . HYPERLIPIDEMIA 01/14/2007  . INSOMNIA 12/03/2006  . POLYP,  COLON 09/12/2006  . Uncontrolled diabetes mellitus ([place]) 09/12/2006  . Obesity 09/12/2006  . Hypertension 09/12/2006  . Asthma 09/12/2006  . GERD 09/12/2006  . OSTEOARTHRITIS 09/12/2006  . MICROCYTOSIS 09/12/2006  . HYSTERECTOMY, HX OF 09/12/2006  . Essential (primary) hypertension 09/12/2006  . Type 2 diabetes mellitus ([place]) 09/12/2006   Current Outpatient Prescriptions on File Prior to Visit  Medication Sig Dispense Refill  . albuterol (PROVENTIL,VENTOLIN) 90 MCG/ACT inhaler Inhale 2 puffs into the lungs every 6 (six) hours as needed for shortness of breath. Wheezing or shortness of breath    . amLODipine (NORVASC) 10 MG tablet Take 10 mg by mouth every morning.   0  . amoxicillin (AMOXIL) 500 MG capsule Take 1 capsule (500 mg total) by mouth 3 (three) times daily. (Patient not taking: Reported on 03/11/2017) 21 capsule 0  . antipyrine-benzocaine (AURALGAN) otic solution Place 3-4 drops into both ears every 2 (two) hours as needed for ear pain. 10 mL 0  . aspirin 81 MG tablet Take 81 mg by mouth every morning.     . baclofen (LIORESAL) 20 MG tablet Take 20 mg by mouth 2 (two) times daily.  0  . benzonatate (TESSALON) 100 MG capsule Take 1 capsule (100 mg total) by mouth every 8 (eight) hours. (Patient taking differently: Take 100 mg by mouth 3 (three) times daily as needed. ) 21 capsule 0  . citalopram (CELEXA) 10 MG tablet Take 10 mg by mouth every morning.   0  . colchicine 0.6 MG tablet Take 0.6 mg by mouth every morning.   0  . COMBIGAN 0.2-0.5 % ophthalmic solution Place 1 drop into  both eyes every 12 (twelve) hours.   0  . diclofenac sodium (VOLTAREN) 1 % GEL Apply 2 g topically 4 (four) times daily as needed (pain).     Marland Kitchen dicyclomine (BENTYL) 10 MG capsule Take 1 tab 3 times a day before meals. (Patient taking differently: Take 10 mg by mouth 3 (three) times daily as needed for spasms. ) 90 capsule 1  . docusate sodium (COLACE) 100 MG capsule Take 1 capsule (100 mg total) by mouth 2  (two) times daily as needed for mild constipation. 60 capsule 0  . ergocalciferol (VITAMIN D2) 50000 UNITS capsule Take 1 capsule (50,000 Units total) by mouth once a week. Usually takes on Saturday or fridays 30 capsule 0  . famotidine (PEPCID) 20 MG tablet Take 20 mg by mouth every morning.     . fenofibrate (TRICOR) 145 MG tablet Take 1 tablet (145 mg total) by mouth daily. 30 tablet 0  . ferrous sulfate 325 (65 FE) MG tablet Take 1 tablet (325 mg total) by mouth daily with breakfast. 90 tablet 3  . fluticasone (FLONASE) 50 MCG/ACT nasal spray Place 1 spray into both nostrils 2 (two) times daily. 1 g 2  . fluticasone-salmeterol (ADVAIR HFA) 115-21 MCG/ACT inhaler Inhale 2 puffs into the lungs 2 (two) times daily. 1 Inhaler 12  . gabapentin (NEURONTIN) 300 MG capsule Take 1 capsule (300 mg total) by mouth at bedtime. (Patient taking differently: Take 300-600 mg by mouth 2 (two) times daily as needed (nerve pain). ) 30 capsule 2  . HYDROcodone-acetaminophen (NORCO) 10-325 MG tablet Take 1-2 tablets by mouth every 6 (six) hours as needed. (Patient not taking: Reported on 03/11/2017) 40 tablet 0  . insulin NPH-regular Human (NOVOLIN 70/30) (70-30) 100 UNIT/ML injection Inject 60 Units into the skin daily with breakfast. (Patient taking differently: Inject 60 Units into the skin 2 (two) times daily. ) 10 mL 11  . ipratropium (ATROVENT) 0.02 % nebulizer solution Take 2.5 mLs (0.5 mg total) by nebulization 3 (three) times daily as needed for wheezing or shortness of breath. 75 mL 12  . ipratropium (ATROVENT) 0.03 % nasal spray Place 2 sprays into both nostrils every 12 (twelve) hours. (Patient taking differently: Place 2 sprays into both nostrils 4 (four) times daily. ) 30 mL 0  . Lancets MISC by Does not apply route.      . loratadine (CLARITIN) 10 MG tablet Take 10 mg by mouth every morning.     . metFORMIN (GLUCOPHAGE) 500 MG tablet Take 2 tablets (1,000 mg total) by mouth 2 (two) times daily with a meal.  (Patient taking differently: Take 500 mg by mouth 2 (two) times daily with a meal. ) 60 tablet 3  . methocarbamol (ROBAXIN) 500 MG tablet Take 1 tablet (500 mg total) by mouth 2 (two) times daily. 20 tablet 0  . metoprolol (LOPRESSOR) 100 MG tablet Take 100 mg by mouth 2 (two) times daily.  0  . metoprolol (LOPRESSOR) 50 MG tablet Take 1 tablet (50 mg total) by mouth 2 (two) times daily. (Patient not taking: Reported on 10/19/2016) 30 tablet 0  . nitroGLYCERIN (NITROSTAT) 0.4 MG SL tablet Place 0.4 mg under the tongue every 5 (five) minutes as needed for chest pain.   0  . omeprazole (PRILOSEC) 20 MG capsule Take 20 mg by mouth every morning.  0  . ondansetron (ZOFRAN ODT) 4 MG disintegrating tablet 35m ODT q4 hours prn nausea/vomit (Patient taking differently: Take 4 mg by mouth every 8 (eight)  hours as needed for nausea or vomiting. 7m ODT q4 hours prn nausea/vomit) 20 tablet 0  . pantoprazole (PROTONIX) 40 MG tablet Take 1 tablet (40 mg total) by mouth daily. 30 mins prior to breakfast. 30 tablet 6  . polyethylene glycol (MIRALAX / GLYCOLAX) packet Take 17 g by mouth daily. (Patient taking differently: Take 17 g by mouth every morning. ) 14 each 0  . potassium chloride (K-DUR) 10 MEQ tablet Take 1 tablet (10 mEq total) by mouth 3 (three) times daily. 45 tablet 0  . promethazine (PHENERGAN) 25 MG tablet Take 0.5 tablets (12.5 mg total) by mouth every 6 (six) hours as needed for nausea or vomiting. (Patient taking differently: Take 25 mg by mouth every 6 (six) hours as needed for nausea or vomiting. ) 30 tablet 0  . RESTASIS 0.05 % ophthalmic emulsion Place 1 drop into both eyes 2 (two) times daily.   0  . simvastatin (ZOCOR) 40 MG tablet Take 0.5 tablets (20 mg total) by mouth at bedtime. (Patient taking differently: Take 40 mg by mouth at bedtime. ) 30 tablet 0  . SUBOXONE 8-2 MG FILM Take 1 Film by mouth 2 (two) times daily.  0  . tobramycin (TOBREX) 0.3 % ophthalmic ointment Place 1 application  into both eyes 4 (four) times daily.     . travoprost, benzalkonium, (TRAVATAN) 0.004 % ophthalmic solution Place 1 drop into both eyes at bedtime. 2.5 mL 12   No current facility-administered medications on file prior to visit.    Allergies  Allergen Reactions  . Ace Inhibitors     UNSPECIFIED REACTION   . Latex Rash  . Percocet [Oxycodone-Acetaminophen] Itching         Recent Results (from the past 2160 hour(s))  Urinalysis, Routine w reflex microscopic     Status: Abnormal   Collection Time: 03/11/17  7:06 PM  Result Value Ref Range   Color, Urine YELLOW YELLOW   APPearance HAZY (A) CLEAR   Specific Gravity, Urine 1.026 1.005 - 1.030   pH 5.0 5.0 - 8.0   Glucose, UA NEGATIVE NEGATIVE mg/dL   Hgb urine dipstick NEGATIVE NEGATIVE   Bilirubin Urine NEGATIVE NEGATIVE   Ketones, ur NEGATIVE NEGATIVE mg/dL   Protein, ur 100 (A) NEGATIVE mg/dL   Nitrite NEGATIVE NEGATIVE   Leukocytes, UA LARGE (A) NEGATIVE   RBC / HPF 6-30 0 - 5 RBC/hpf   WBC, UA TOO NUMEROUS TO COUNT 0 - 5 WBC/hpf   Bacteria, UA RARE (A) NONE SEEN   Squamous Epithelial / LPF 0-5 (A) NONE SEEN   Mucous PRESENT    Hyaline Casts, UA PRESENT    Non Squamous Epithelial 0-5 (A) NONE SEEN  Lipase, blood     Status: None   Collection Time: 03/11/17  7:43 PM  Result Value Ref Range   Lipase 16 11 - 51 U/L  Comprehensive metabolic panel     Status: Abnormal   Collection Time: 03/11/17  7:43 PM  Result Value Ref Range   Sodium 140 135 - 145 mmol/L   Potassium 3.7 3.5 - 5.1 mmol/L   Chloride 105 101 - 111 mmol/L   CO2 25 22 - 32 mmol/L   Glucose, Bld 152 (H) 65 - 99 mg/dL   BUN 13 6 - 20 mg/dL   Creatinine, Ser 0.90 0.44 - 1.00 mg/dL   Calcium 8.6 (L) 8.9 - 10.3 mg/dL   Total Protein 8.7 (H) 6.5 - 8.1 g/dL   Albumin 4.3 3.5 - 5.0  g/dL   AST 9 (L) 15 - 41 U/L   ALT 12 (L) 14 - 54 U/L   Alkaline Phosphatase 65 38 - 126 U/L   Total Bilirubin 0.4 0.3 - 1.2 mg/dL   GFR calc non Af Amer >60 >60 mL/min   GFR calc  Af Amer >60 >60 mL/min    Comment: (NOTE) The eGFR has been calculated using the CKD EPI equation. This calculation has not been validated in all clinical situations. eGFR's persistently <60 mL/min signify possible Chronic Kidney Disease.    Anion gap 10 5 - 15  CBC     Status: Abnormal   Collection Time: 03/11/17  7:43 PM  Result Value Ref Range   WBC 10.4 4.0 - 10.5 K/uL   RBC 5.13 (H) 3.87 - 5.11 MIL/uL   Hemoglobin 10.2 (L) 12.0 - 15.0 g/dL   HCT 33.6 (L) 36.0 - 46.0 %   MCV 65.5 (L) 78.0 - 100.0 fL   MCH 19.9 (L) 26.0 - 34.0 pg   MCHC 30.4 30.0 - 36.0 g/dL   RDW 20.5 (H) 11.5 - 15.5 %   Platelets 318 150 - 400 K/uL    Objective: General: Patient is awake, alert, and oriented x 3 and in no acute distress.  Integument: Skin is warm, dry and supple bilateral. Nails are tender, long, thickened and dystrophic with subungual debris, consistent with onychomycosis, 1-5 bilateral. No signs of infection. No open lesions or preulcerative lesions present bilateral. Remaining integument unremarkable.  Vasculature:  Dorsalis Pedis pulse 1/4 bilateral. Posterior Tibial pulse  1/4 bilateral. Capillary fill time <3 sec 1-5 bilateral. Scant hair growth to the level of the digits.Temperature gradient within normal limits. No varicosities present bilateral. No edema present bilateral.   Neurology: The patient has intact sensation measured with a 5.07/10g Semmes Weinstein Monofilament at all pedal sites bilateral . Vibratory sensation diminished bilateral with tuning fork. No Babinski sign present bilateral. Subjective numbness bilateral.  Musculoskeletal: No symptomatic pedal deformities noted bilateral. Muscular strength 5/5 in all lower extremity muscular groups bilateral without pain on range of motion . No tenderness with calf compression bilateral.  Assessment and Plan: Problem List Items Addressed This Visit    None    Visit Diagnoses    Pain due to onychomycosis of nail    -  Primary    Diabetic polyneuropathy associated with type 2 diabetes mellitus ([place])         -Examined patient. -Discussed and educated patient on diabetic foot care, especially with  regards to the vascular, neurological and musculoskeletal systems.  -Stressed the importance of good glycemic control and the detriment of not controlling glucose levels in relation to the foot. -Mechanically debrided all nails 1-5 bilateral using sterile nail nipper and filed with dremel without incident  -Continued with Gabapentin -Answered all patient questions -Patient to return  in 3 months for at risk foot care -Patient advised to call the office if any problems or questions arise in the meantime.  Landis Martins, DPM

## 2017-04-15 ENCOUNTER — Ambulatory Visit (HOSPITAL_COMMUNITY)
Admission: EM | Admit: 2017-04-15 | Discharge: 2017-04-15 | Disposition: A | Discharge status: Home / Self Care | Payer: Medicaid Other | Attending: Internal Medicine | Admitting: Internal Medicine

## 2017-04-15 ENCOUNTER — Encounter (HOSPITAL_COMMUNITY): Payer: Self-pay | Admitting: Emergency Medicine

## 2017-04-15 DIAGNOSIS — E119 Type 2 diabetes mellitus without complications: Secondary | ICD-10-CM

## 2017-04-15 DIAGNOSIS — Z794 Long term (current) use of insulin: Secondary | ICD-10-CM

## 2017-04-15 DIAGNOSIS — Z760 Encounter for issue of repeat prescription: Secondary | ICD-10-CM | POA: Diagnosis not present

## 2017-04-15 DIAGNOSIS — I10 Essential (primary) hypertension: Secondary | ICD-10-CM | POA: Diagnosis not present

## 2017-04-15 MED ORDER — METFORMIN HCL 500 MG PO TABS: ORAL_TABLET | ORAL | 0 refills | Status: DC

## 2017-04-15 MED ORDER — POTASSIUM CHLORIDE ER 10 MEQ PO TBCR: 10.0000 meq | EXTENDED_RELEASE_TABLET | Freq: Every day | ORAL | 0 refills | Status: DC

## 2017-04-15 MED ORDER — AMLODIPINE BESYLATE 10 MG PO TABS: 10.0000 mg | ORAL_TABLET | Freq: Every day | ORAL | 0 refills | Status: DC

## 2017-04-15 MED ORDER — CITALOPRAM HYDROBROMIDE 10 MG PO TABS: 10.0000 mg | ORAL_TABLET | Freq: Every day | ORAL | 0 refills | Status: DC

## 2017-04-15 MED ORDER — INSULIN NPH ISOPHANE & REGULAR (70-30) 100 UNIT/ML ~~LOC~~ SUSP: SUBCUTANEOUS | 1 refills | Status: DC

## 2017-04-15 MED ORDER — METOPROLOL TARTRATE 50 MG PO TABS: 50.0000 mg | ORAL_TABLET | Freq: Two times a day (BID) | ORAL | 0 refills | Status: DC

## 2017-04-15 NOTE — Discharge Instructions (Signed)
He must check your blood sugars daily and frequently and administer the correct amount of insulin based on your past amount of insulin administered and your blood sugar. There is a risk of taking medications prescribed by a health care provider without obtaining a complete history, labwork or proper physical exam, etc. This cannot be completed adequately at an urgent care. There can be multiple problems, some serious,  associated with medications and undetermined conditions of your health status. This action is performed as a last resort in order to supply you with medication as we are not a primary care facility. By receiving these prescriptions you are ackowleging and accepting these risks and will not hold the prescriber or any agent of [facility] as responsible for any adverse outcomes.

## 2017-04-15 NOTE — ED Provider Notes (Signed)
CSN: [id number]     Arrival date & time 04/15/17  1612 History   None    Chief Complaint  Patient presents with  . Medication Refill   (Consider location/radiation/quality/duration/timing/severity/associated sxs/prior Treatment) 61 year old female with history of type 2 diabetes mellitus, hypertension, metabolic syndrome, asthma, sickle cell trait, OSA several other acute recurring problems. She states she has been out of her medicines for 3 months and that her doctor was crazy and would not feel for her and she is here now for refill of her medicines. We counted 40 medication she was on and we are narrowing it down to the most essential.      Past Medical History:  Diagnosis Date  . Abnormal findings-skull or head    "/Dr. 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 Anginal pain ([place])    LAST CP TODAY  . Anxiety   . Asthma   . Blood dyscrasia   . CAD (coronary artery disease)   . [facility]   . CHF (congestive heart failure) ([place])   . Chronic bronchitis ([place])    "get it q year"  . Chronic lower back pain   . Colon polyp   . Complication of anesthesia    "couldn't get me woke when they put m3e to sleep for my teeth"  . Dental caries    periodontitis, lesion left mandible  . Depression 11/07   Hospitalization required  . Diverticulosis   . Dyspnea    W/ EXERTION      . Esophageal stricture   . Fibromyalgia   . GERD (gastroesophageal reflux disease)   . Gout   . Headache    "weekly" (08/26/2015)     LITTLE BETTER  09/18/16  . Hemorrhoids   . History of hiatal hernia   . Hyperlipidemia   . Hypertension   . IBS (irritable bowel syndrome)   . Lupus   . Microcytosis   . Myocardial infarction ([place])    "slight one years ago"  . Obesity   . OSA on CPAP    CPAP AT NIGHT W/ O2 PRN  . Osteoarthritis    "all over"  . Pneumonia "several times"  . PONV (postoperative nausea and vomiting)   . Seizures ([place])   . Sickle-cell trait ([place])    . Sleep disorder   . Spinal headache   . Thyroid disease    "? high or low" (08/26/2015)  . Type II diabetes mellitus ([place])    Past Surgical History:  Procedure Laterality Date  . ABDOMINAL HYSTERECTOMY    . CARDIAC CATHETERIZATION N/A 09/20/2016   Procedure: Left Heart Cath and Coronary Angiography;  Surgeon: Dixie Dials, MD;  Location: [facility];  Service: Cardiovascular;  Laterality: N/A;  . COLONOSCOPY    . CORONARY ANGIOPLASTY WITH STENT PLACEMENT     "I''ve had alot"  . DILATION AND CURETTAGE OF UTERUS    . ESOPHAGOGASTRODUODENOSCOPY (EGD) WITH ESOPHAGEAL DILATION  "all the time"  . EYE SURGERY Bilateral    "had surgery on the sides of my eyes to keep them popping out"  . head surgery     metal plate placed  . MULTIPLE EXTRACTIONS WITH ALVEOLOPLASTY N/A 10/19/2016   Procedure: MULTIPLE EXTRACTION WITH ALVEOLOPLASTY;  Surgeon: Diona Browner, DDS;  Location: [facility];  Service: Oral Surgery;  Laterality: N/A;  . MULTIPLE TOOTH EXTRACTIONS  ?1980's   "& cut into my jaw"  . TONSILLECTOMY    . TUBAL  LIGATION    . UPPER GASTROINTESTINAL ENDOSCOPY     Family History  Problem Relation Age of Onset  . Rectal cancer Sister   . Lung cancer Father   . Esophageal cancer Father   . Breast cancer Sister 32  . Breast cancer Mother 50  . Colon cancer Other        Aunt, Uncle  . Liver cancer Brother        Nephew  . Diabetes Sister   . Diabetes Brother   . Diabetes Other        son & daughter  . Heart disease Sister   . Heart disease Brother   . Kidney disease Sister   . Breast cancer Maternal Aunt        patient reports 11 mat aunts with breast cancer  . Breast cancer Paternal Aunt 85  . Breast cancer Maternal Grandmother        unk age   Social History  Substance Use Topics  . Smoking status: Never Smoker  . Smokeless tobacco: Never Used  . Alcohol use No   OB History    No data available     Review of Systems  Constitutional:       "I hurt all over"  nothing specific.  Respiratory: Negative.   Cardiovascular: Negative.   All other systems reviewed and are negative.   Allergies  Ace inhibitors; Latex; and Percocet [oxycodone-acetaminophen]  Home Medications   Prior to Admission medications   Medication Sig Start Date End Date Taking? Authorizing Provider  albuterol (PROVENTIL,VENTOLIN) 90 MCG/ACT inhaler Inhale 2 puffs into the lungs every 6 (six) hours as needed for shortness of breath. Wheezing or shortness of breath    [provider]  amLODipine (NORVASC) 10 MG tablet Take 1 tablet (10 mg total) by mouth daily. 04/15/17   Janne Napoleon, NP  antipyrine-benzocaine Toniann Fail) otic solution Place 3-4 drops into both ears every 2 (two) hours as needed for ear pain. 02/15/17   Patrecia Pour, MD  aspirin 81 MG tablet Take 81 mg by mouth every morning.     [provider]  baclofen (LIORESAL) 20 MG tablet Take 20 mg by mouth 2 (two) times daily. 03/06/17   [provider]  citalopram (CELEXA) 10 MG tablet Take 1 tablet (10 mg total) by mouth daily. 04/15/17   Janne Napoleon, NP  colchicine 0.6 MG tablet Take 0.6 mg by mouth every morning.  07/16/16   [provider]  COMBIGAN 0.2-0.5 % ophthalmic solution Place 1 drop into both eyes every 12 (twelve) hours.  02/19/17   [provider]  diclofenac sodium (VOLTAREN) 1 % GEL Apply 2 g topically 4 (four) times daily as needed (pain).     [provider]  docusate sodium (COLACE) 100 MG capsule Take 1 capsule (100 mg total) by mouth 2 (two) times daily as needed for mild constipation. 09/14/16   Julianne Rice, MD  ergocalciferol (VITAMIN D2) 50000 UNITS capsule Take 1 capsule (50,000 Units total) by mouth once a week. Usually takes on Saturday or fridays 08/28/15   Charlynne Cousins, MD  famotidine (PEPCID) 20 MG tablet Take 20 mg by mouth every morning.     [provider]  fenofibrate (TRICOR) 145 MG tablet Take 1 tablet (145 mg total) by  mouth daily. 08/28/15   Charlynne Cousins, MD  ferrous sulfate 325 (65 FE) MG tablet Take 1 tablet (325 mg total) by mouth daily with breakfast. 08/28/15  Charlynne Cousins, MD  fluticasone [facility]) 50 MCG/ACT nasal spray Place 1 spray into both nostrils 2 (two) times daily. 08/28/15   Charlynne Cousins, MD  fluticasone-salmeterol (ADVAIR HFA) [phone] MCG/ACT inhaler Inhale 2 puffs into the lungs 2 (two) times daily. 08/28/15   Charlynne Cousins, MD  insulin NPH-regular Human (NOVOLIN 70/30) (70-30) 100 UNIT/ML injection Inject 60 units BID 04/15/17   Janne Napoleon, NP  ipratropium (ATROVENT) 0.02 % nebulizer solution Take 2.5 mLs (0.5 mg total) by nebulization 3 (three) times daily as needed for wheezing or shortness of breath. 08/28/15   Charlynne Cousins, MD  ipratropium (ATROVENT) 0.03 % nasal spray Place 2 sprays into both nostrils every 12 (twelve) hours. Patient taking differently: Place 2 sprays into both nostrils 4 (four) times daily.  02/15/17   Patrecia Pour, MD  Lancets MISC by Does not apply route.      [provider]  loratadine (CLARITIN) 10 MG tablet Take 10 mg by mouth every morning.     [provider]  metFORMIN (GLUCOPHAGE) 500 MG tablet 1 tab po bid with meals 04/15/17   Janne Napoleon, NP  methocarbamol (ROBAXIN) 500 MG tablet Take 1 tablet (500 mg total) by mouth 2 (two) times daily. 03/22/14   Antonietta Breach, PA-C  metoprolol tartrate (LOPRESSOR) 50 MG tablet Take 1 tablet (50 mg total) by mouth 2 (two) times daily. 04/15/17   Janne Napoleon, NP  nitroGLYCERIN (NITROSTAT) 0.4 MG SL tablet Place 0.4 mg under the tongue every 5 (five) minutes as needed for chest pain.  09/07/16   [provider]  omeprazole (PRILOSEC) 20 MG capsule Take 20 mg by mouth every morning. 03/06/17   [provider]  pantoprazole (PROTONIX) 40 MG tablet Take 1 tablet (40 mg total) by mouth daily. 30 mins prior to breakfast. 12/21/15   Hvozdovic, Lori P, PA-C   polyethylene glycol (MIRALAX / GLYCOLAX) packet Take 17 g by mouth daily. Patient taking differently: Take 17 g by mouth every morning.  09/14/16   Julianne Rice, MD  potassium chloride (K-DUR) 10 MEQ tablet Take 1 tablet (10 mEq total) by mouth daily. 04/15/17   Janne Napoleon, NP  promethazine (PHENERGAN) 25 MG tablet Take 0.5 tablets (12.5 mg total) by mouth every 6 (six) hours as needed for nausea or vomiting. Patient taking differently: Take 25 mg by mouth every 6 (six) hours as needed for nausea or vomiting.  03/11/14   Dixie Dials, MD  RESTASIS 0.05 % ophthalmic emulsion Place 1 drop into both eyes 2 (two) times daily.  09/10/16   [provider]  simvastatin (ZOCOR) 40 MG tablet Take 0.5 tablets (20 mg total) by mouth at bedtime. Patient taking differently: Take 40 mg by mouth at bedtime.  08/28/15   Charlynne Cousins, MD  SUBOXONE 8-2 MG FILM Take 1 Film by mouth 2 (two) times daily. 03/08/17   [provider]  tobramycin (TOBREX) 0.3 % ophthalmic ointment Place 1 application into both eyes 4 (four) times daily.     [provider]  travoprost, benzalkonium, (TRAVATAN) 0.004 % ophthalmic solution Place 1 drop into both eyes at bedtime. 08/28/15   Charlynne Cousins, MD   Meds Ordered and Administered this Visit  Medications - No data to display  BP (!) 158/91 (BP Location: Right Arm) Comment (BP Location): large cuff  Pulse 71   Temp 98.5 F (36.9 C) (Oral)   Resp 18   SpO2 99%  No data found.  Physical Exam  Constitutional: She is oriented to person, place, and time. She appears well-developed and well-nourished. No distress.  Eyes: EOM are normal.  Neck: Normal range of motion. Neck supple.  Cardiovascular: Normal rate.   Pulmonary/Chest: Effort normal. No respiratory distress.  Musculoskeletal: She exhibits no edema.  Neurological: She is alert and oriented to person, place, and time. She exhibits normal muscle tone.  Skin: Skin is warm and  dry.  Psychiatric: She has a normal mood and affect.  Nursing note and vitals reviewed.   Urgent Care Course     Procedures (including critical care time)  Labs Review Labs Reviewed - No data to display  Imaging Review No results found.   Visual Acuity Review  Right Eye Distance:   Left Eye Distance:   Bilateral Distance:    Right Eye Near:   Left Eye Near:    Bilateral Near:         MDM   1. Medication refill   2. Type 2 diabetes mellitus without complication, with long-term current use of insulin ([place])   3. Essential hypertension    There is a risk of taking medications prescribed by a health care provider without obtaining a complete history, labwork or proper physical exam, etc. This cannot be completed adequately at an urgent care. There can be multiple problems, some serious,  associated with medications and undetermined conditions of your health status. This action is performed as a last resort in order to supply you with medication as we are not a primary care facility. By receiving these prescriptions you are ackowleging and accepting these risks and will not hold the prescriber or any agent of [facility] as responsible for any adverse outcomes.   Meds ordered this encounter  Medications  . insulin NPH-regular Human (NOVOLIN 70/30) (70-30) 100 UNIT/ML injection    Sig: Inject 60 units BID    Dispense:  10 mL    Refill:  1    Order Specific Question:   Supervising Provider    Answer:   Sherlene Shams [[id number]]  . amLODipine (NORVASC) 10 MG tablet    Sig: Take 1 tablet (10 mg total) by mouth daily.    Dispense:  30 tablet    Refill:  0    Order Specific Question:   Supervising Provider    Answer:   Sherlene Shams [[id number]]  . metFORMIN (GLUCOPHAGE) 500 MG tablet    Sig: 1 tab po bid with meals    Dispense:  60 tablet    Refill:  0    Order Specific Question:   Supervising Provider    Answer:   Sherlene Shams [[id number]]   . metoprolol tartrate (LOPRESSOR) 50 MG tablet    Sig: Take 1 tablet (50 mg total) by mouth 2 (two) times daily.    Dispense:  60 tablet    Refill:  0    Order Specific Question:   Supervising Provider    Answer:   Sherlene Shams [[id number]]  . potassium chloride (K-DUR) 10 MEQ tablet    Sig: Take 1 tablet (10 mEq total) by mouth daily.    Dispense:  30 tablet    Refill:  0    Order Specific Question:   Supervising Provider    Answer:   Sherlene Shams [[id number]]  . citalopram (CELEXA) 10 MG tablet    Sig: Take 1 tablet (10 mg total) by mouth daily.    Dispense:  30 tablet    Refill:  0    Order Specific Question:   Supervising Provider    Answer:   Sherlene Shams [[id number]]       Janne Napoleon, NP 04/15/17 1933

## 2017-04-15 NOTE — ED Triage Notes (Signed)
Patient says she is out of medications.  Patient is completely out of cardiac medicines.  Some will run out soon.  Has an appt with a new provider on June 16

## 2017-05-20 ENCOUNTER — Encounter: Payer: Self-pay | Admitting: Internal Medicine

## 2017-05-20 ENCOUNTER — Ambulatory Visit (INDEPENDENT_AMBULATORY_CARE_PROVIDER_SITE_OTHER): Payer: Self-pay | Admitting: Internal Medicine

## 2017-05-20 VITALS — BP 160/100 | HR 76 | Resp 12 | Ht 64.0 in | Wt 278.0 lb

## 2017-05-20 DIAGNOSIS — H0520 Unspecified exophthalmos: Secondary | ICD-10-CM | POA: Diagnosis not present

## 2017-05-20 DIAGNOSIS — I10 Essential (primary) hypertension: Secondary | ICD-10-CM | POA: Diagnosis not present

## 2017-05-20 DIAGNOSIS — E118 Type 2 diabetes mellitus with unspecified complications: Secondary | ICD-10-CM

## 2017-05-20 DIAGNOSIS — K219 Gastro-esophageal reflux disease without esophagitis: Secondary | ICD-10-CM

## 2017-05-20 DIAGNOSIS — Z794 Long term (current) use of insulin: Secondary | ICD-10-CM | POA: Diagnosis not present

## 2017-05-20 DIAGNOSIS — E782 Mixed hyperlipidemia: Secondary | ICD-10-CM

## 2017-05-20 DIAGNOSIS — R55 Syncope and collapse: Secondary | ICD-10-CM | POA: Diagnosis not present

## 2017-05-20 LAB — GLUCOSE, POCT (MANUAL RESULT ENTRY): POC GLUCOSE: 133 mg/dL — AB (ref 70–99)

## 2017-05-20 MED ORDER — IPRATROPIUM BROMIDE 0.03 % NA SOLN: 2.0000 | Freq: Two times a day (BID) | NASAL | 11 refills | Status: DC

## 2017-05-20 MED ORDER — CITALOPRAM HYDROBROMIDE 10 MG PO TABS: 10.0000 mg | ORAL_TABLET | Freq: Every day | ORAL | 11 refills | Status: DC

## 2017-05-20 MED ORDER — ERGOCALCIFEROL 1.25 MG (50000 UT) PO CAPS: 50000.0000 [IU] | ORAL_CAPSULE | ORAL | 0 refills | Status: DC

## 2017-05-20 MED ORDER — COMBIGAN 0.2-0.5 % OP SOLN: 1.0000 [drp] | Freq: Two times a day (BID) | OPHTHALMIC | 11 refills | Status: DC

## 2017-05-20 MED ORDER — FERROUS SULFATE 325 (65 FE) MG PO TABS: 325.0000 mg | ORAL_TABLET | Freq: Every day | ORAL | 3 refills | Status: DC

## 2017-05-20 MED ORDER — DICLOFENAC SODIUM 1 % TD GEL: 2.0000 g | Freq: Four times a day (QID) | TRANSDERMAL | 6 refills | Status: DC | PRN

## 2017-05-20 MED ORDER — FLUTICASONE PROPIONATE 50 MCG/ACT NA SUSP: NASAL | 11 refills | Status: DC

## 2017-05-20 MED ORDER — METFORMIN HCL 500 MG PO TABS: ORAL_TABLET | ORAL | 0 refills | Status: DC

## 2017-05-20 MED ORDER — LORATADINE 10 MG PO TABS: 10.0000 mg | ORAL_TABLET | ORAL | 11 refills | Status: DC

## 2017-05-20 MED ORDER — POLYETHYLENE GLYCOL 3350 17 G PO PACK: 17.0000 g | PACK | Freq: Every day | ORAL | 11 refills | Status: DC

## 2017-05-20 MED ORDER — PANTOPRAZOLE SODIUM 40 MG PO TBEC: 40.0000 mg | DELAYED_RELEASE_TABLET | Freq: Every day | ORAL | 6 refills | Status: DC

## 2017-05-20 MED ORDER — AMLODIPINE BESYLATE 10 MG PO TABS: 10.0000 mg | ORAL_TABLET | Freq: Every day | ORAL | 11 refills | Status: AC

## 2017-05-20 MED ORDER — SIMVASTATIN 40 MG PO TABS: 40.0000 mg | ORAL_TABLET | Freq: Every day | ORAL | 11 refills | Status: DC

## 2017-05-20 MED ORDER — POTASSIUM CHLORIDE ER 10 MEQ PO TBCR: 10.0000 meq | EXTENDED_RELEASE_TABLET | Freq: Every day | ORAL | 11 refills | Status: DC

## 2017-05-20 MED ORDER — IPRATROPIUM BROMIDE 0.02 % IN SOLN: 0.5000 mg | Freq: Three times a day (TID) | RESPIRATORY_TRACT | 11 refills | Status: DC

## 2017-05-20 MED ORDER — ALLOPURINOL 100 MG PO TABS: 100.0000 mg | ORAL_TABLET | Freq: Every day | ORAL | 11 refills | Status: DC

## 2017-05-20 MED ORDER — FENOFIBRATE 145 MG PO TABS: 145.0000 mg | ORAL_TABLET | Freq: Every day | ORAL | 11 refills | Status: DC

## 2017-05-20 MED ORDER — LANCETS MISC: 11 refills | Status: AC

## 2017-05-20 MED ORDER — DOCUSATE SODIUM 100 MG PO CAPS: 100.0000 mg | ORAL_CAPSULE | Freq: Every day | ORAL | 11 refills | Status: DC

## 2017-05-20 MED ORDER — METOPROLOL TARTRATE 50 MG PO TABS: 50.0000 mg | ORAL_TABLET | Freq: Two times a day (BID) | ORAL | 0 refills | Status: DC

## 2017-05-20 MED ORDER — FLUTICASONE-SALMETEROL 115-21 MCG/ACT IN AERO: 2.0000 | INHALATION_SPRAY | Freq: Two times a day (BID) | RESPIRATORY_TRACT | 12 refills | Status: AC

## 2017-05-20 MED ORDER — PROMETHAZINE HCL 25 MG PO TABS: 12.5000 mg | ORAL_TABLET | Freq: Four times a day (QID) | ORAL | 0 refills | Status: DC | PRN

## 2017-05-20 MED ORDER — INSULIN NPH ISOPHANE & REGULAR (70-30) 100 UNIT/ML ~~LOC~~ SUSP: SUBCUTANEOUS | 11 refills | Status: DC
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
## 2017-05-23 ENCOUNTER — Other Ambulatory Visit: Payer: Self-pay | Admitting: Cardiovascular Disease

## 2017-05-23 DIAGNOSIS — M7989 Other specified soft tissue disorders: Secondary | ICD-10-CM

## 2017-05-23 DIAGNOSIS — M79604 Pain in right leg: Secondary | ICD-10-CM

## 2017-05-24 ENCOUNTER — Telehealth: Payer: Self-pay | Admitting: Internal Medicine

## 2017-05-24 NOTE — Telephone Encounter (Signed)
error 

## 2017-05-27 ENCOUNTER — Ambulatory Visit: Payer: Medicaid Other

## 2017-05-27 ENCOUNTER — Other Ambulatory Visit: Payer: Self-pay | Admitting: Cardiovascular Disease

## 2017-05-27 ENCOUNTER — Ambulatory Visit (HOSPITAL_COMMUNITY)
Admission: RE | Admit: 2017-05-27 | Discharge: 2017-05-27 | Disposition: A | Discharge status: Home / Self Care | Payer: Medicaid Other | Source: Ambulatory Visit | Attending: Cardiovascular Disease | Admitting: Cardiovascular Disease

## 2017-05-27 DIAGNOSIS — M79661 Pain in right lower leg: Secondary | ICD-10-CM | POA: Diagnosis not present

## 2017-05-27 DIAGNOSIS — M79604 Pain in right leg: Secondary | ICD-10-CM | POA: Diagnosis not present

## 2017-05-27 DIAGNOSIS — M7989 Other specified soft tissue disorders: Principal | ICD-10-CM

## 2017-05-27 NOTE — Progress Notes (Signed)
*  Preliminary Results* Right lower extremity venous duplex completed. Visualized veins of the right lower extremity are negative for deep vein thrombosis. There is no evidence of right Baker's cyst.  Preliminary results discussed with Dr. Doylene Canard.  05/27/2017 8:57 AM  Maudry Mayhew, BS, RVT, RDCS, RDMS

## 2017-06-03 ENCOUNTER — Other Ambulatory Visit: Payer: Self-pay | Admitting: Sports Medicine

## 2017-06-03 DIAGNOSIS — E1142 Type 2 diabetes mellitus with diabetic polyneuropathy: Secondary | ICD-10-CM

## 2017-06-19 ENCOUNTER — Other Ambulatory Visit: Payer: Self-pay | Admitting: Internal Medicine

## 2017-06-22 ENCOUNTER — Emergency Department (HOSPITAL_COMMUNITY): Payer: Medicaid Other

## 2017-06-22 ENCOUNTER — Encounter (HOSPITAL_COMMUNITY): Payer: Self-pay | Admitting: *Deleted

## 2017-06-22 ENCOUNTER — Observation Stay (HOSPITAL_COMMUNITY)
Admission: EM | Admit: 2017-06-22 | Discharge: 2017-06-23 | Disposition: A | Discharge status: Home / Self Care | Payer: Medicaid Other | Attending: Family Medicine | Admitting: Family Medicine

## 2017-06-22 DIAGNOSIS — K589 Irritable bowel syndrome without diarrhea: Secondary | ICD-10-CM | POA: Diagnosis not present

## 2017-06-22 DIAGNOSIS — I10 Essential (primary) hypertension: Secondary | ICD-10-CM | POA: Diagnosis present

## 2017-06-22 DIAGNOSIS — M329 Systemic lupus erythematosus, unspecified: Secondary | ICD-10-CM | POA: Diagnosis not present

## 2017-06-22 DIAGNOSIS — I110 Hypertensive heart disease with heart failure: Secondary | ICD-10-CM | POA: Diagnosis not present

## 2017-06-22 DIAGNOSIS — D509 Iron deficiency anemia, unspecified: Secondary | ICD-10-CM | POA: Diagnosis not present

## 2017-06-22 DIAGNOSIS — I2510 Atherosclerotic heart disease of native coronary artery without angina pectoris: Secondary | ICD-10-CM | POA: Insufficient documentation

## 2017-06-22 DIAGNOSIS — M797 Fibromyalgia: Secondary | ICD-10-CM | POA: Insufficient documentation

## 2017-06-22 DIAGNOSIS — M109 Gout, unspecified: Secondary | ICD-10-CM | POA: Insufficient documentation

## 2017-06-22 DIAGNOSIS — Z6841 Body Mass Index (BMI) 40.0 and over, adult: Secondary | ICD-10-CM | POA: Insufficient documentation

## 2017-06-22 DIAGNOSIS — G4733 Obstructive sleep apnea (adult) (pediatric): Secondary | ICD-10-CM | POA: Diagnosis not present

## 2017-06-22 DIAGNOSIS — I252 Old myocardial infarction: Secondary | ICD-10-CM | POA: Insufficient documentation

## 2017-06-22 DIAGNOSIS — Z885 Allergy status to narcotic agent status: Secondary | ICD-10-CM | POA: Insufficient documentation

## 2017-06-22 DIAGNOSIS — R079 Chest pain, unspecified: Secondary | ICD-10-CM | POA: Diagnosis present

## 2017-06-22 DIAGNOSIS — H0520 Unspecified exophthalmos: Secondary | ICD-10-CM | POA: Insufficient documentation

## 2017-06-22 DIAGNOSIS — I7389 Other specified peripheral vascular diseases: Secondary | ICD-10-CM | POA: Diagnosis not present

## 2017-06-22 DIAGNOSIS — Z888 Allergy status to other drugs, medicaments and biological substances status: Secondary | ICD-10-CM | POA: Insufficient documentation

## 2017-06-22 DIAGNOSIS — Z7982 Long term (current) use of aspirin: Secondary | ICD-10-CM | POA: Insufficient documentation

## 2017-06-22 DIAGNOSIS — J341 Cyst and mucocele of nose and nasal sinus: Secondary | ICD-10-CM | POA: Insufficient documentation

## 2017-06-22 DIAGNOSIS — J45909 Unspecified asthma, uncomplicated: Secondary | ICD-10-CM | POA: Insufficient documentation

## 2017-06-22 DIAGNOSIS — R51 Headache: Secondary | ICD-10-CM | POA: Insufficient documentation

## 2017-06-22 DIAGNOSIS — E785 Hyperlipidemia, unspecified: Secondary | ICD-10-CM | POA: Insufficient documentation

## 2017-06-22 DIAGNOSIS — D573 Sickle-cell trait: Secondary | ICD-10-CM | POA: Diagnosis not present

## 2017-06-22 DIAGNOSIS — IMO0002 Reserved for concepts with insufficient information to code with codable children: Secondary | ICD-10-CM | POA: Diagnosis present

## 2017-06-22 DIAGNOSIS — M1990 Unspecified osteoarthritis, unspecified site: Secondary | ICD-10-CM | POA: Diagnosis not present

## 2017-06-22 DIAGNOSIS — Z794 Long term (current) use of insulin: Secondary | ICD-10-CM | POA: Insufficient documentation

## 2017-06-22 DIAGNOSIS — I509 Heart failure, unspecified: Secondary | ICD-10-CM | POA: Diagnosis not present

## 2017-06-22 DIAGNOSIS — F419 Anxiety disorder, unspecified: Secondary | ICD-10-CM | POA: Insufficient documentation

## 2017-06-22 DIAGNOSIS — J0100 Acute maxillary sinusitis, unspecified: Secondary | ICD-10-CM | POA: Insufficient documentation

## 2017-06-22 DIAGNOSIS — Z91040 Latex allergy status: Secondary | ICD-10-CM | POA: Insufficient documentation

## 2017-06-22 DIAGNOSIS — R0789 Other chest pain: Principal | ICD-10-CM | POA: Insufficient documentation

## 2017-06-22 DIAGNOSIS — R200 Anesthesia of skin: Secondary | ICD-10-CM | POA: Insufficient documentation

## 2017-06-22 DIAGNOSIS — Z9989 Dependence on other enabling machines and devices: Secondary | ICD-10-CM | POA: Insufficient documentation

## 2017-06-22 DIAGNOSIS — E1165 Type 2 diabetes mellitus with hyperglycemia: Secondary | ICD-10-CM | POA: Diagnosis not present

## 2017-06-22 DIAGNOSIS — Z86010 Personal history of colonic polyps: Secondary | ICD-10-CM | POA: Insufficient documentation

## 2017-06-22 DIAGNOSIS — Z79899 Other long term (current) drug therapy: Secondary | ICD-10-CM | POA: Insufficient documentation

## 2017-06-22 DIAGNOSIS — K219 Gastro-esophageal reflux disease without esophagitis: Secondary | ICD-10-CM | POA: Insufficient documentation

## 2017-06-22 LAB — BASIC METABOLIC PANEL
Anion gap: 11 (ref 5–15)
BUN: 19 mg/dL (ref 6–20)
CO2: 25 mmol/L (ref 22–32)
CREATININE: 1.09 mg/dL — AB (ref 0.44–1.00)
Calcium: 9 mg/dL (ref 8.9–10.3)
Chloride: 98 mmol/L — ABNORMAL LOW (ref 101–111)
GFR calc Af Amer: >60 mL/min (ref >60)
GFR, EST NON AFRICAN AMERICAN: 54 mL/min — AB (ref >60)
Glucose, Bld: 236 mg/dL — ABNORMAL HIGH (ref 65–99)
POTASSIUM: 4.1 mmol/L (ref 3.5–5.1)
SODIUM: 134 mmol/L — AB (ref 135–145)

## 2017-06-22 LAB — CBC
HCT: 32 % — ABNORMAL LOW (ref 36.0–46.0)
Hemoglobin: 9.9 g/dL — ABNORMAL LOW (ref 12.0–15.0)
MCH: 19.7 pg — ABNORMAL LOW (ref 26.0–34.0)
MCHC: 30.9 g/dL (ref 30.0–36.0)
MCV: 63.7 fL — ABNORMAL LOW (ref 78.0–100.0)
PLATELETS: 346 10*3/uL (ref 150–400)
RBC: 5.02 MIL/uL (ref 3.87–5.11)
RDW: 21.9 % — AB (ref 11.5–15.5)
WBC: 7.4 10*3/uL (ref 4.0–10.5)

## 2017-06-22 LAB — I-STAT TROPONIN, ED
TROPONIN I, POC: 0 ng/mL (ref 0.00–0.08)
TROPONIN I, POC: 0 ng/mL (ref 0.00–0.08)

## 2017-06-22 MED ORDER — ACETAMINOPHEN 500 MG PO TABS
1000.0000 mg | ORAL_TABLET | Freq: Once | ORAL | Status: AC
  Administered 2017-06-22: 1000 mg via ORAL
  Filled 2017-06-22: qty 2

## 2017-06-22 MED ORDER — NITROGLYCERIN 0.4 MG SL SUBL
0.4000 mg | SUBLINGUAL_TABLET | SUBLINGUAL | Status: AC | PRN
  Administered 2017-06-23 (×3): 0.4 mg via SUBLINGUAL
  Filled 2017-06-22 (×3): qty 1

## 2017-06-22 MED ORDER — ASPIRIN 81 MG PO CHEW
324.0000 mg | CHEWABLE_TABLET | Freq: Once | ORAL | Status: AC
  Administered 2017-06-22: 324 mg via ORAL
  Filled 2017-06-22: qty 4

## 2017-06-22 NOTE — ED Triage Notes (Signed)
The pt is c/o chest pain and a headache for over a week

## 2017-06-22 NOTE — ED Provider Notes (Signed)
TIME SEEN: 11:02 PM  CHIEF COMPLAINT: Chest pain, headache, left-sided numbness  HPI: Patient is a 61 year old female with history of CAD status post stent, hypertension, diabetes, hyperlipidemia who presents emergency department with complaints of intermittent left sided chest pressure for the past week. Pain is worse with exertion and better with rest. States she has tried nitroglycerin with some relief. Is on aspirin 81 mg daily. Had a intermediate stress test in November 2017. Reports her last cardiac catheterization was "years ago". She states she does have associated shortness of breath, nausea vomiting, diaphoresis and dizziness with these episodes. No fevers or cough. No lower extremity swelling or pain.  Patient also complaining of posterior bilateral headache. Described as throbbing, moderate to severe. Gradual onset. No head injury. Reports numbness in her left face, arm and leg for the past week. No focal weakness. No other antiplatelets or anticoagulants other than aspirin. Has had similar headaches before but has never had left-sided numbness.  ROS: See HPI Constitutional: no fever  Eyes: no drainage  ENT: no runny nose   Cardiovascular:   chest pain  Resp:  SOB  GI: no vomiting GU: no dysuria Integumentary: no rash  Allergy: no hives  Musculoskeletal: no leg swelling  Neurological: no slurred speech ROS otherwise negative  PAST MEDICAL HISTORY/PAST SURGICAL HISTORY:  Past Medical History:  Diagnosis Date  . Abnormal findings-skull or head    "/Dr. 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 Anginal pain ([place])    LAST CP TODAY  . Anxiety   . Asthma   . Blood dyscrasia   . CAD (coronary artery disease)   . Cataract   . CHF (congestive heart failure) ([place])   . Chronic bronchitis ([place])    "get it q year"  . Chronic lower back pain   . Colon polyp   . Complication of anesthesia    "couldn't get me woke when they put m3e to sleep  for my teeth"  . Dental caries    periodontitis, lesion left mandible  . Depression 11/07   Hospitalization required  . Diverticulosis   . Dyspnea    W/ EXERTION      . Esophageal stricture   . Fibromyalgia   . GERD (gastroesophageal reflux disease)   . Gout   . Headache    "weekly" (08/26/2015)     LITTLE BETTER  09/18/16  . Hemorrhoids   . History of hiatal hernia   . Hyperlipidemia   . Hypertension   . IBS (irritable bowel syndrome)   . Lupus   . Microcytosis   . Myocardial infarction ([place])    "slight one years ago"  . Obesity   . OSA on CPAP    CPAP AT NIGHT W/ O2 PRN  . Osteoarthritis    "all over"  . Pneumonia "several times"  . PONV (postoperative nausea and vomiting)   . Seizures ([place])   . Sickle-cell trait ([place])   . Sleep disorder   . Spinal headache   . Thyroid disease    "? high or low" (08/26/2015)  . Type II diabetes mellitus ([facility])     MEDICATIONS:  Prior to Admission medications   Medication Sig Start Date End Date Taking? Authorizing Provider  albuterol (PROVENTIL,VENTOLIN) 90 MCG/ACT inhaler Inhale 2 puffs into the lungs every 6 (six) hours as needed for shortness of breath. Wheezing or shortness of breath    [provider]  allopurinol (ZYLOPRIM) 100 MG tablet  Take 1 tablet (100 mg total) by mouth daily. 05/20/17   Mack Hook, MD  amLODipine (NORVASC) 10 MG tablet Take 1 tablet (10 mg total) by mouth daily. 05/20/17   Mack Hook, MD  antipyrine-benzocaine Toniann Fail) otic solution Place 3-4 drops into both ears every 2 (two) hours as needed for ear pain. 02/15/17   Patrecia Pour, MD  aspirin 81 MG tablet Take 81 mg by mouth every morning.     [provider]  baclofen (LIORESAL) 20 MG tablet Take 20 mg by mouth 2 (two) times daily. 03/06/17   [provider]  citalopram (CELEXA) 10 MG tablet Take 1 tablet (10 mg total) by mouth daily. 05/20/17   Mack Hook, MD  COMBIGAN 0.2-0.5 % ophthalmic solution  Place 1 drop into both eyes every 12 (twelve) hours. 05/20/17   Mack Hook, MD  diclofenac sodium (VOLTAREN) 1 % GEL Apply 2 g topically 4 (four) times daily as needed (pain). 05/20/17   Mack Hook, MD  docusate sodium (COLACE) 100 MG capsule Take 1 capsule (100 mg total) by mouth daily. 05/20/17   Mack Hook, MD  ergocalciferol (VITAMIN D2) 50000 units capsule Take 1 capsule (50,000 Units total) by mouth once a week. Usually takes on Saturday or fridays 05/20/17   Mack Hook, MD  fenofibrate (TRICOR) 145 MG tablet Take 1 tablet (145 mg total) by mouth daily. 05/20/17   Mack Hook, MD  ferrous sulfate 325 (65 FE) MG tablet Take 1 tablet (325 mg total) by mouth daily with breakfast. 05/20/17   Mack Hook, MD  fluticasone Asencion Islam) 50 MCG/ACT nasal spray 2 sprays each nostril once daily 05/20/17   Mack Hook, MD  fluticasone-salmeterol (ADVAIR HFA) [phone] MCG/ACT inhaler Inhale 2 puffs into the lungs 2 (two) times daily. 05/20/17   Mack Hook, MD  insulin NPH-regular Human (NOVOLIN 70/30) (70-30) 100 UNIT/ML injection Inject 60 units BID 05/20/17   Mack Hook, MD  ipratropium (ATROVENT) 0.02 % nebulizer solution Take 2.5 mLs (0.5 mg total) by nebulization 3 (three) times daily. Nebulize 0.5 mg three times daily 05/20/17   Mack Hook, MD  ipratropium (ATROVENT) 0.03 % nasal spray Place 2 sprays into both nostrils every 12 (twelve) hours. 05/20/17   Mack Hook, MD  Lancets MISC Check sugars twice daily before meals 05/20/17   Mack Hook, MD  Latanoprostene Bunod (VYZULTA) 0.024 % SOLN Apply to eye. 1 drop each eye twice a day    [provider]  loratadine (CLARITIN) 10 MG tablet Take 1 tablet (10 mg total) by mouth every morning. 05/20/17   Mack Hook, MD  metFORMIN (GLUCOPHAGE) 500 MG tablet 1 tab po bid with meals 05/20/17   Mack Hook, MD  methocarbamol (ROBAXIN) 500 MG tablet Take  1 tablet (500 mg total) by mouth 2 (two) times daily. 03/22/14   Antonietta Breach, PA-C  metoprolol tartrate (LOPRESSOR) 50 MG tablet Take 1 tablet (50 mg total) by mouth 2 (two) times daily. 05/20/17   Mack Hook, MD  nitroGLYCERIN (NITROSTAT) 0.4 MG SL tablet Place 0.4 mg under the tongue every 5 (five) minutes as needed for chest pain.  09/07/16   [provider]  pantoprazole (PROTONIX) 40 MG tablet Take 1 tablet (40 mg total) by mouth daily. 30 mins prior to breakfast. 05/20/17   Mack Hook, MD  polyethylene glycol (MIRALAX / GLYCOLAX) packet Take 17 g by mouth daily. 05/20/17   Mack Hook, MD  potassium chloride (K-DUR) 10 MEQ tablet Take 1 tablet (10 mEq total)  by mouth daily. 05/20/17   Mack Hook, MD  promethazine (PHENERGAN) 25 MG tablet take 1/2 tablet by mouth every 6 hours if needed for nausea or vomiting 06/19/17   Mack Hook, MD  RESTASIS 0.05 % ophthalmic emulsion Place 1 drop into both eyes 2 (two) times daily.  09/10/16   [provider]  simvastatin (ZOCOR) 40 MG tablet Take 1 tablet (40 mg total) by mouth at bedtime. 05/20/17   Mack Hook, MD  SUBOXONE 8-2 MG FILM Take 1 Film by mouth 2 (two) times daily. 03/08/17   [provider]  tobramycin (TOBREX) 0.3 % ophthalmic ointment Place 1 application into both eyes 4 (four) times daily.     [provider]  travoprost, benzalkonium, (TRAVATAN) 0.004 % ophthalmic solution Place 1 drop into both eyes at bedtime. 08/28/15   Charlynne Cousins, MD    ALLERGIES:  Allergies  Allergen Reactions  . Ace Inhibitors Cough    UNSPECIFIED REACTION   . Latex Rash  . Percocet [Oxycodone-Acetaminophen] Itching         SOCIAL HISTORY:  Social History  Substance Use Topics  . Smoking status: Never Smoker  . Smokeless tobacco: Never Used  . Alcohol use No    FAMILY HISTORY: Family History  Problem Relation Age of Onset  . Rectal cancer Sister   . Lung  cancer Father   . Esophageal cancer Father   . Breast cancer Sister 41  . Breast cancer Mother 79  . Colon cancer Other        Aunt, Uncle  . Liver cancer Brother        Nephew  . Diabetes Sister   . Diabetes Brother   . Diabetes Other        son & daughter  . Heart disease Sister   . Heart disease Brother   . Kidney disease Sister   . Breast cancer Maternal Aunt        patient reports 11 mat aunts with breast cancer  . Breast cancer Paternal Aunt 85  . Breast cancer Maternal Grandmother        unk age    EXAM: BP 129/69 (BP Location: Left Arm)   Pulse 70   Temp 98.8 F (37.1 C) (Oral)   Resp 14   Ht 5\' 7"  (1.702 m)   Wt 133.8 kg (295 lb)   SpO2 100%   BMI 46.20 kg/m  CONSTITUTIONAL: Alert and oriented and responds appropriately to questions. Well-appearing; well-nourished, Obese HEAD: Normocephalic EYES: Conjunctivae clear, pupils appear equal, EOMI ENT: normal nose; moist mucous membranes NECK: Supple, no meningismus, no nuchal rigidity, no LAD  CARD: RRR; S1 and S2 appreciated; no murmurs, no clicks, no rubs, no gallops RESP: Normal chest excursion without splinting or tachypnea; breath sounds clear and equal bilaterally; no wheezes, no rhonchi, no rales, no hypoxia or respiratory distress, speaking full sentences ABD/GI: Normal bowel sounds; non-distended; soft, non-tender, no rebound, no guarding, no peritoneal signs, no hepatosplenomegaly BACK:  The back appears normal and is non-tender to palpation, there is no CVA tenderness EXT: Normal ROM in all joints; non-tender to palpation; no edema; normal capillary refill; no cyanosis, no calf tenderness or swelling    SKIN: Normal color for age and race; warm; no rash NEURO: Moves all extremities equally, diminished sensation in left face, arm and leg. No pronator drift. Normal speech. Radial nerves II through XII intact. PSYCH: The patient's mood and manner are appropriate. Grooming and personal hygiene are  appropriate.  MEDICAL DECISION MAKING: Patient here with exertional chest pain with multiple risk factors including history of cardiac disease with a stent. In November had an intermediate stress test. She has had 2 negative troponins here but continues to have some chest pain. We'll give aspirin, nitroglycerin. Chest x-ray is clear. She is hemodynamically stable without tachycardia, hypoxia, tachypnea. Have low suspicion that this is a pulmonary embolus. Doubt dissection at this time. She is also complaining of headache but has had similar headaches before. Is now having left-sided numbness that is new for her. This has been present for the past week. She is outside of TPA window. Will obtain head CT. Will discuss with neurology for consultation and anticipate admission to medicine.  ED PROGRESS: CT head shows no acute abnormality. We'll discuss with hospitalist for admission.  12:29 AM Discussed patient's case with hospitalist, Dr. Tamala Julian.  I have recommended admission and patient (and family if present) agree with this plan. Admitting physician will place admission orders.   I reviewed all nursing notes, vitals, pertinent previous records, EKGs, lab and urine results, imaging (as available).     EKG Interpretation  Date/Time:  Saturday June 22 2017 18:18:28 EDT Ventricular Rate:  75 PR Interval:  168 QRS Duration: 84 QT Interval:  396 QTC Calculation: 442 R Axis:   -3 Text Interpretation:  Normal sinus rhythm Normal ECG Confirmed by Elnora Morrison [phone]) on 06/22/2017 10:25:38 PM         Laiylah Roettger, Delice Bison, DO 06/23/17 0030

## 2017-06-22 NOTE — ED Notes (Signed)
Nurse currently starting IV and will draw labs. 

## 2017-06-23 ENCOUNTER — Observation Stay (HOSPITAL_COMMUNITY): Payer: Medicaid Other

## 2017-06-23 DIAGNOSIS — R079 Chest pain, unspecified: Secondary | ICD-10-CM | POA: Diagnosis present

## 2017-06-23 DIAGNOSIS — I10 Essential (primary) hypertension: Secondary | ICD-10-CM

## 2017-06-23 DIAGNOSIS — J0101 Acute recurrent maxillary sinusitis: Secondary | ICD-10-CM

## 2017-06-23 DIAGNOSIS — E0800 Diabetes mellitus due to underlying condition with hyperosmolarity without nonketotic hyperglycemic-hyperosmolar coma (NKHHC): Secondary | ICD-10-CM | POA: Diagnosis not present

## 2017-06-23 DIAGNOSIS — R200 Anesthesia of skin: Secondary | ICD-10-CM | POA: Diagnosis present

## 2017-06-23 DIAGNOSIS — Z794 Long term (current) use of insulin: Secondary | ICD-10-CM

## 2017-06-23 LAB — CBC WITH DIFFERENTIAL/PLATELET
BASOS ABS: 0 10*3/uL (ref 0.0–0.1)
Basophils Relative: 0 %
EOS ABS: 0.1 10*3/uL (ref 0.0–0.7)
Eosinophils Relative: 1 %
HEMATOCRIT: 30.9 % — AB (ref 36.0–46.0)
Hemoglobin: 9.7 g/dL — ABNORMAL LOW (ref 12.0–15.0)
LYMPHS ABS: 3.1 10*3/uL (ref 0.7–4.0)
Lymphocytes Relative: 46 %
MCH: 19.9 pg — ABNORMAL LOW (ref 26.0–34.0)
MCHC: 31.4 g/dL (ref 30.0–36.0)
MCV: 63.4 fL — ABNORMAL LOW (ref 78.0–100.0)
MONO ABS: 0.3 10*3/uL (ref 0.1–1.0)
MONOS PCT: 5 %
NEUTROS ABS: 3.3 10*3/uL (ref 1.7–7.7)
Neutrophils Relative %: 48 %
PLATELETS: 290 10*3/uL (ref 150–400)
RBC: 4.87 MIL/uL (ref 3.87–5.11)
RDW: 21.6 % — AB (ref 11.5–15.5)
WBC: 6.8 10*3/uL (ref 4.0–10.5)

## 2017-06-23 LAB — LIPID PANEL
Cholesterol: 179 mg/dL (ref 0–200)
HDL: 33 mg/dL — ABNORMAL LOW (ref >40)
LDL CALC: 112 mg/dL — AB (ref 0–99)
TRIGLYCERIDES: 170 mg/dL — AB (ref <150)
Total CHOL/HDL Ratio: 5.4 RATIO
VLDL: 34 mg/dL (ref 0–40)

## 2017-06-23 LAB — IRON AND TIBC
Iron: 32 ug/dL (ref 28–170)
Saturation Ratios: 8 % — ABNORMAL LOW (ref 10.4–31.8)
TIBC: 398 ug/dL (ref 250–450)
UIBC: 366 ug/dL

## 2017-06-23 LAB — TROPONIN I

## 2017-06-23 LAB — GLUCOSE, CAPILLARY
GLUCOSE-CAPILLARY: 210 mg/dL — AB (ref 65–99)
Glucose-Capillary: 201 mg/dL — ABNORMAL HIGH (ref 65–99)
Glucose-Capillary: 279 mg/dL — ABNORMAL HIGH (ref 65–99)

## 2017-06-23 LAB — MRSA PCR SCREENING: MRSA by PCR: INVALID — AB

## 2017-06-23 MED ORDER — DOXYCYCLINE HYCLATE 100 MG PO CAPS: 100.0000 mg | ORAL_CAPSULE | Freq: Two times a day (BID) | ORAL | 0 refills | Status: DC

## 2017-06-23 MED ORDER — BRIMONIDINE TARTRATE-TIMOLOL 0.2-0.5 % OP SOLN: 1.0000 [drp] | Freq: Two times a day (BID) | OPHTHALMIC | Status: DC

## 2017-06-23 MED ORDER — ALLOPURINOL 100 MG PO TABS
100.0000 mg | ORAL_TABLET | Freq: Every day | ORAL | Status: DC
  Administered 2017-06-23: 100 mg via ORAL
  Filled 2017-06-23: qty 1

## 2017-06-23 MED ORDER — ONDANSETRON HCL 4 MG/2ML IJ SOLN: 4.0000 mg | Freq: Four times a day (QID) | INTRAMUSCULAR | Status: DC | PRN

## 2017-06-23 MED ORDER — MOMETASONE FURO-FORMOTEROL FUM 200-5 MCG/ACT IN AERO
2.0000 | INHALATION_SPRAY | Freq: Two times a day (BID) | RESPIRATORY_TRACT | Status: DC
  Administered 2017-06-23: 2 via RESPIRATORY_TRACT
  Filled 2017-06-23: qty 8.8

## 2017-06-23 MED ORDER — TIMOLOL MALEATE 0.5 % OP SOLN
1.0000 [drp] | Freq: Two times a day (BID) | OPHTHALMIC | Status: DC
  Administered 2017-06-23: 1 [drp] via OPHTHALMIC
  Filled 2017-06-23: qty 5

## 2017-06-23 MED ORDER — DICLOFENAC SODIUM 1 % TD GEL
2.0000 g | Freq: Four times a day (QID) | TRANSDERMAL | Status: DC | PRN
  Filled 2017-06-23: qty 100

## 2017-06-23 MED ORDER — CHLORHEXIDINE GLUCONATE CLOTH 2 % EX PADS
6.0000 | MEDICATED_PAD | Freq: Every day | CUTANEOUS | Status: DC
  Administered 2017-06-23: 6 via TOPICAL

## 2017-06-23 MED ORDER — ACETAMINOPHEN 325 MG PO TABS
650.0000 mg | ORAL_TABLET | ORAL | Status: DC | PRN
  Administered 2017-06-23: 650 mg via ORAL
  Filled 2017-06-23: qty 2

## 2017-06-23 MED ORDER — BRIMONIDINE TARTRATE 0.2 % OP SOLN
1.0000 [drp] | Freq: Two times a day (BID) | OPHTHALMIC | Status: DC
  Administered 2017-06-23: 1 [drp] via OPHTHALMIC
  Filled 2017-06-23: qty 5

## 2017-06-23 MED ORDER — LATANOPROSTENE BUNOD 0.024 % OP SOLN: 1.0000 [drp] | Freq: Two times a day (BID) | OPHTHALMIC | Status: DC

## 2017-06-23 MED ORDER — AMLODIPINE BESYLATE 5 MG PO TABS
10.0000 mg | ORAL_TABLET | Freq: Every day | ORAL | Status: DC
  Administered 2017-06-23: 10 mg via ORAL
  Filled 2017-06-23: qty 2

## 2017-06-23 MED ORDER — IPRATROPIUM BROMIDE 0.06 % NA SOLN
2.0000 | Freq: Two times a day (BID) | NASAL | Status: DC
  Administered 2017-06-23: 2 via NASAL
  Filled 2017-06-23: qty 15

## 2017-06-23 MED ORDER — MUPIROCIN 2 % EX OINT
1.0000 "application " | TOPICAL_OINTMENT | Freq: Two times a day (BID) | CUTANEOUS | Status: DC
  Administered 2017-06-23: 1 via NASAL
  Filled 2017-06-23: qty 22

## 2017-06-23 MED ORDER — SODIUM CHLORIDE 0.9 % IV SOLN
INTRAVENOUS | Status: DC
  Administered 2017-06-23: 05:00:00 via INTRAVENOUS

## 2017-06-23 MED ORDER — INSULIN ASPART PROT & ASPART (70-30 MIX) 100 UNIT/ML ~~LOC~~ SUSP
30.0000 [IU] | Freq: Two times a day (BID) | SUBCUTANEOUS | Status: DC
  Administered 2017-06-23: 30 [IU] via SUBCUTANEOUS
  Filled 2017-06-23: qty 10

## 2017-06-23 MED ORDER — GI COCKTAIL ~~LOC~~
30.0000 mL | Freq: Four times a day (QID) | ORAL | Status: DC | PRN
  Administered 2017-06-23: 30 mL via ORAL
  Filled 2017-06-23: qty 30

## 2017-06-23 MED ORDER — ENOXAPARIN SODIUM 40 MG/0.4ML ~~LOC~~ SOLN
40.0000 mg | SUBCUTANEOUS | Status: DC
  Administered 2017-06-23: 40 mg via SUBCUTANEOUS
  Filled 2017-06-23: qty 0.4

## 2017-06-23 MED ORDER — FENTANYL CITRATE (PF) 100 MCG/2ML IJ SOLN
50.0000 ug | Freq: Once | INTRAMUSCULAR | Status: AC
  Administered 2017-06-23: 50 ug via INTRAVENOUS
  Filled 2017-06-23: qty 2

## 2017-06-23 MED ORDER — FENTANYL CITRATE (PF) 100 MCG/2ML IJ SOLN: 25.0000 ug | INTRAMUSCULAR | Status: DC | PRN

## 2017-06-23 NOTE — Progress Notes (Signed)
Patient arrived on the unit from the ER assessment completed see flowsheet, placed on tele ccmd notified, patient oriented to room and staff, bed in lowest position, call bell within reach will continue to monitor.

## 2017-06-23 NOTE — Progress Notes (Signed)
Patient given discharge instructions medication list and prescriptions sent to personal pharmacy, follow up appointments also reviewed.all questions were answered IV and tele dcd. Will discharge home as ordered. Jameyah Fennewald, Bettina Gavia rN

## 2017-06-23 NOTE — ED Notes (Signed)
Pt ambulatory to restroom with cane, steady gait noted

## 2017-06-23 NOTE — ED Notes (Signed)
Admitting MD at bedside.

## 2017-06-23 NOTE — ED Notes (Signed)
Pt. Reports a decrease in pain after 3 nitroglycerin. Pt. States a pain of 7 on a scale of 0 to 10.

## 2017-06-23 NOTE — Discharge Summary (Addendum)
Physician Discharge Summary  Chelsea Lester YKD:[id number] DOB: 27-Feb-1956 DOA: 06/22/2017  PCP: Mack Hook, MD Cardiologist: Dr. Doylene Canard  Admit date: 06/22/2017 Discharge date: 06/23/2017  Admitted From: HOME  Disposition: HOME   Recommendations for Outpatient Follow-up:  1. Follow up with PCP in 1 weeks 2. Please obtain BMP/CBC in one week 3. Echocardiogram deferred to Dr. Doylene Canard if study needed  Discharge Condition: STABLE   CODE STATUS: FULL    Brief Hospitalization Summary: Please see all hospital notes, images, labs for full details of the hospitalization. HPI: Chelsea Lester is a 61 y.o. female with medical history significant of HTN, HLD, CAD s/p stent, diabetes mellitus type 2, morbid obesity, OSA on CPAP  Who presents with complaints of chest pain over the last 1 week. Patient reports having left-sided chest pain that she describes as dull to sharp with radiation down the left arm. Symptoms can occur at rest and with activity and lasts for minutes. Initially patient was utilizing nitroglycerin, but reported no change in symptoms. Because of the nitroglycerin patient complains of having a headache. Associated symptoms include shortness of breath, fatigue, diaphoresis, nausea, and at least one episode of vomiting. Patient reports having right lower extremity swelling for years evaluated with a Doppler ultrasound on 05/27/2017 negative for signs of a DVT. Denies having any blood in stools, loss of consciousness, palpitations, change in vision, or slurred speech. Followed by Dr. Doylene Canard of cardiology.  Pt had a cath with normal coronary arteries in November 2017 as well as a stress test at that time.   Chest pain: Acute. Patient reports having chest pain at rest and with exertion. Initial workup and negative troponins 2 and EKG without any signs of any ischemic changes. Heart score 5. - troponin negative x 3 for acute  - Unable to get echo done in hospital in reasonable  time, would defer to outpatient cardiologist for study if needed. Since symptoms resolved now, I think patient is stable to have test done outpatient.    Left-sided numbness and headache: Acute. Symptoms totally resolved.  - Neuro checks negative - MRI brain negative, except for acute left maxillary sinusitis  Left maxillary sinusitis acute - doxycycline x 7 days, follow up with PCP  Essential hypertension - Continue amlodipine  Microcytic microchromic anemia: Chronic. Hemoglobin 9.9 on admission with low MCV and MCH. Patient's baseline hemoglobin is 9-10. - repeat Hg 9.7 - stable, follow up with PCP.   Diabetes mellitus type 2, uncontrolled: Last hemoglobin A1c on file was noted to be 8.1 back in 10/2016. Patient presents with a blood sugar elevated to 236 on admission. - Hypoglycemic protocols  - follow up with PCP for further management.   Morbid Obesity BMI 46.3 - Pt could benefit from weight loss surgery, would defer discussion to PCP.   OSA on CPAP - CPAP per RT  - Pt met with care manager to assist her with getting appropriate cleaning solution for device and equipment  DVT prophylaxis: lovenox  Code Status: Full  Family Communication:  patient's family present at bedside.  Disposition Plan: Discharge home Consults called: none  Discharge Diagnoses:  Principal Problem:   Chest pain RESOLVED Active Problems:   Left sided numbness - Resolved   SINUSITIS, MAXILLARY, ACUTE   Uncontrolled diabetes mellitus (HCC)   Obesity, Class III, BMI 40-49.9 (morbid obesity) ([place])   Hypertension  Discharge Instructions: Discharge Instructions    (HEART FAILURE PATIENTS) Call MD:  Anytime you have any of the following symptoms: 1)  3 pound weight gain in 24 hours or 5 pounds in 1 week 2) shortness of breath, with or without a dry hacking cough 3) swelling in the hands, feet or stomach 4) if you have to sleep on extra pillows at night in order to breathe.    Complete by:  As  directed    Call MD for:  difficulty breathing, headache or visual disturbances    Complete by:  As directed    Call MD for:  extreme fatigue    Complete by:  As directed    Call MD for:  hives    Complete by:  As directed    Call MD for:  persistant dizziness or light-headedness    Complete by:  As directed    Call MD for:  persistant nausea and vomiting    Complete by:  As directed    Call MD for:  severe uncontrolled pain    Complete by:  As directed    Call MD for:  temperature >100.4    Complete by:  As directed    Increase activity slowly    Complete by:  As directed      Allergies as of 06/23/2017      Reactions   Ace Inhibitors Cough   UNSPECIFIED REACTION    Latex Rash   Percocet [oxycodone-acetaminophen] Itching          Medication List    TAKE these medications   albuterol 90 MCG/ACT inhaler Commonly known as:  PROVENTIL,VENTOLIN Inhale 2 puffs into the lungs every 6 (six) hours as needed for shortness of breath. Wheezing or shortness of breath   allopurinol 100 MG tablet Commonly known as:  ZYLOPRIM Take 1 tablet (100 mg total) by mouth daily.   amLODipine 10 MG tablet Commonly known as:  NORVASC Take 1 tablet (10 mg total) by mouth daily.   antipyrine-benzocaine OTIC solution Commonly known as:  AURALGAN Place 3-4 drops into both ears every 2 (two) hours as needed for ear pain.   aspirin 81 MG tablet Take 81 mg by mouth every morning.   baclofen 20 MG tablet Commonly known as:  LIORESAL Take 20 mg by mouth 2 (two) times daily.   citalopram 10 MG tablet Commonly known as:  CELEXA Take 1 tablet (10 mg total) by mouth daily.   COMBIGAN 0.2-0.5 % ophthalmic solution Generic drug:  brimonidine-timolol Place 1 drop into both eyes every 12 (twelve) hours.   diclofenac sodium 1 % Gel Commonly known as:  VOLTAREN Apply 2 g topically 4 (four) times daily as needed (pain).   docusate sodium 100 MG capsule Commonly known as:  COLACE Take 1 capsule  (100 mg total) by mouth daily.   doxycycline 100 MG capsule Commonly known as:  VIBRAMYCIN Take 1 capsule (100 mg total) by mouth 2 (two) times daily.   ergocalciferol 50000 units capsule Commonly known as:  VITAMIN D2 Take 1 capsule (50,000 Units total) by mouth once a week. Usually takes on Saturday or fridays   fenofibrate 145 MG tablet Commonly known as:  TRICOR Take 1 tablet (145 mg total) by mouth daily.   ferrous sulfate 325 (65 FE) MG tablet Take 1 tablet (325 mg total) by mouth daily with breakfast.   fluticasone 50 MCG/ACT nasal spray Commonly known as:  FLONASE 2 sprays each nostril once daily   fluticasone-salmeterol 115-21 MCG/ACT inhaler Commonly known as:  ADVAIR HFA Inhale 2 puffs into the lungs 2 (two) times daily.   insulin NPH-regular  Human (70-30) 100 UNIT/ML injection Commonly known as:  NOVOLIN 70/30 Inject 60 units BID   ipratropium 0.02 % nebulizer solution Commonly known as:  ATROVENT Take 2.5 mLs (0.5 mg total) by nebulization 3 (three) times daily. Nebulize 0.5 mg three times daily   ipratropium 0.03 % nasal spray Commonly known as:  ATROVENT Place 2 sprays into both nostrils every 12 (twelve) hours.   Lancets Misc Check sugars twice daily before meals   loratadine 10 MG tablet Commonly known as:  CLARITIN Take 1 tablet (10 mg total) by mouth every morning.   metFORMIN 500 MG tablet Commonly known as:  GLUCOPHAGE 1 tab po bid with meals   methocarbamol 500 MG tablet Commonly known as:  ROBAXIN Take 1 tablet (500 mg total) by mouth 2 (two) times daily.   metoprolol tartrate 50 MG tablet Commonly known as:  LOPRESSOR Take 1 tablet (50 mg total) by mouth 2 (two) times daily.   nitroGLYCERIN 0.4 MG SL tablet Commonly known as:  NITROSTAT Place 0.4 mg under the tongue every 5 (five) minutes as needed for chest pain.   pantoprazole 40 MG tablet Commonly known as:  PROTONIX Take 1 tablet (40 mg total) by mouth daily. 30 mins prior to  breakfast.   polyethylene glycol packet Commonly known as:  MIRALAX / GLYCOLAX Take 17 g by mouth daily. What changed:  when to take this  reasons to take this   potassium chloride 10 MEQ tablet Commonly known as:  K-DUR Take 1 tablet (10 mEq total) by mouth daily.   promethazine 25 MG tablet Commonly known as:  PHENERGAN take 1/2 tablet by mouth every 6 hours if needed for nausea or vomiting   RESTASIS 0.05 % ophthalmic emulsion Generic drug:  cycloSPORINE Place 1 drop into both eyes 2 (two) times daily.   simvastatin 40 MG tablet Commonly known as:  ZOCOR Take 1 tablet (40 mg total) by mouth at bedtime.   SUBOXONE 8-2 MG Film Generic drug:  Buprenorphine HCl-Naloxone HCl Take 1 Film by mouth 2 (two) times daily.   tobramycin 0.3 % ophthalmic ointment Commonly known as:  TOBREX Place 1 application into both eyes 4 (four) times daily.   travoprost (benzalkonium) 0.004 % ophthalmic solution Commonly known as:  TRAVATAN Place 1 drop into both eyes at bedtime.   VYZULTA 0.024 % Soln Generic drug:  Latanoprostene Bunod Apply to eye. 1 drop each eye twice a day      Follow-up [place] Follow up.   Why:  Please call [facility] for help with getting cleaning solution for cpap at home- use ext. [phone] for assistance at the main # Contact information: [place] [address] [phone]        Mack Hook, MD. Go on 06/25/2017.   Specialty:  Internal Medicine Why:  As already scheduled for Hospital Follow Up  Contact information: [place] [address] [phone]        Dixie Dials, MD. Schedule an appointment as soon as possible for a visit in 1 week(s).   Specialty:  Cardiology Why:  Hospital Follow Up  Contact information: [place] [address] [phone]          Allergies  Allergen Reactions  . Ace Inhibitors Cough    UNSPECIFIED REACTION   .  Latex Rash  . Percocet [Oxycodone-Acetaminophen] Itching        Current Discharge Medication List  START taking these medications   Details  doxycycline (VIBRAMYCIN) 100 MG capsule Take 1 capsule (100 mg total) by mouth 2 (two) times daily. Qty: 14 capsule, Refills: 0      CONTINUE these medications which have NOT CHANGED   Details  albuterol (PROVENTIL,VENTOLIN) 90 MCG/ACT inhaler Inhale 2 puffs into the lungs every 6 (six) hours as needed for shortness of breath. Wheezing or shortness of breath    allopurinol (ZYLOPRIM) 100 MG tablet Take 1 tablet (100 mg total) by mouth daily. Qty: 30 tablet, Refills: 11    amLODipine (NORVASC) 10 MG tablet Take 1 tablet (10 mg total) by mouth daily. Qty: 30 tablet, Refills: 11    antipyrine-benzocaine (AURALGAN) otic solution Place 3-4 drops into both ears every 2 (two) hours as needed for ear pain. Qty: 10 mL, Refills: 0    aspirin 81 MG tablet Take 81 mg by mouth every morning.     baclofen (LIORESAL) 20 MG tablet Take 20 mg by mouth 2 (two) times daily. Refills: 0    citalopram (CELEXA) 10 MG tablet Take 1 tablet (10 mg total) by mouth daily. Qty: 30 tablet, Refills: 11    COMBIGAN 0.2-0.5 % ophthalmic solution Place 1 drop into both eyes every 12 (twelve) hours. Qty: 15 mL, Refills: 11    diclofenac sodium (VOLTAREN) 1 % GEL Apply 2 g topically 4 (four) times daily as needed (pain). Qty: 100 g, Refills: 6    docusate sodium (COLACE) 100 MG capsule Take 1 capsule (100 mg total) by mouth daily. Qty: 30 capsule, Refills: 11    ergocalciferol (VITAMIN D2) 50000 units capsule Take 1 capsule (50,000 Units total) by mouth once a week. Usually takes on Saturday or fridays Qty: 30 capsule, Refills: 0    fenofibrate (TRICOR) 145 MG tablet Take 1 tablet (145 mg total) by mouth daily. Qty: 30 tablet, Refills: 11    ferrous sulfate 325 (65 FE) MG tablet Take 1 tablet (325 mg total) by mouth daily with breakfast. Qty: 90 tablet, Refills:  3    fluticasone (FLONASE) 50 MCG/ACT nasal spray 2 sprays each nostril once daily Qty: 1 g, Refills: 11    fluticasone-salmeterol (ADVAIR HFA) 115-21 MCG/ACT inhaler Inhale 2 puffs into the lungs 2 (two) times daily. Qty: 1 Inhaler, Refills: 12    insulin NPH-regular Human (NOVOLIN 70/30) (70-30) 100 UNIT/ML injection Inject 60 units BID Qty: 10 mL, Refills: 11    ipratropium (ATROVENT) 0.02 % nebulizer solution Take 2.5 mLs (0.5 mg total) by nebulization 3 (three) times daily. Nebulize 0.5 mg three times daily Qty: 225 mL, Refills: 11    ipratropium (ATROVENT) 0.03 % nasal spray Place 2 sprays into both nostrils every 12 (twelve) hours. Qty: 30 mL, Refills: 11    Lancets MISC Check sugars twice daily before meals Qty: 100 each, Refills: 11    Latanoprostene Bunod (VYZULTA) 0.024 % SOLN Apply to eye. 1 drop each eye twice a day    loratadine (CLARITIN) 10 MG tablet Take 1 tablet (10 mg total) by mouth every morning. Qty: 30 tablet, Refills: 11    metFORMIN (GLUCOPHAGE) 500 MG tablet 1 tab po bid with meals Qty: 60 tablet, Refills: 0    methocarbamol (ROBAXIN) 500 MG tablet Take 1 tablet (500 mg total) by mouth 2 (two) times daily. Qty: 20 tablet, Refills: 0    metoprolol tartrate (LOPRESSOR) 50 MG tablet Take 1 tablet (50 mg total) by mouth 2 (two) times daily. Qty: 60 tablet, Refills: 0  nitroGLYCERIN (NITROSTAT) 0.4 MG SL tablet Place 0.4 mg under the tongue every 5 (five) minutes as needed for chest pain.  Refills: 0    pantoprazole (PROTONIX) 40 MG tablet Take 1 tablet (40 mg total) by mouth daily. 30 mins prior to breakfast. Qty: 30 tablet, Refills: 6    polyethylene glycol (MIRALAX / GLYCOLAX) packet Take 17 g by mouth daily. Qty: 14 each, Refills: 11    potassium chloride (K-DUR) 10 MEQ tablet Take 1 tablet (10 mEq total) by mouth daily. Qty: 30 tablet, Refills: 11    promethazine (PHENERGAN) 25 MG tablet take 1/2 tablet by mouth every 6 hours if needed for  nausea or vomiting Qty: 30 tablet, Refills: 0    RESTASIS 0.05 % ophthalmic emulsion Place 1 drop into both eyes 2 (two) times daily.  Refills: 0    simvastatin (ZOCOR) 40 MG tablet Take 1 tablet (40 mg total) by mouth at bedtime. Qty: 30 tablet, Refills: 11    SUBOXONE 8-2 MG FILM Take 1 Film by mouth 2 (two) times daily. Refills: 0    tobramycin (TOBREX) 0.3 % ophthalmic ointment Place 1 application into both eyes 4 (four) times daily.     travoprost, benzalkonium, (TRAVATAN) 0.004 % ophthalmic solution Place 1 drop into both eyes at bedtime. Qty: 2.5 mL, Refills: 12        Procedures/Studies: Dg Chest 2 View  Result Date: 06/22/2017 CLINICAL DATA:  Chest pain and shortness of Breath EXAM: CHEST  2 VIEW COMPARISON:  09/13/2016 FINDINGS: Cardiac shadow is at the upper limits of normal in size but stable. The lungs are well aerated bilaterally. No focal infiltrate or sizable effusion is seen. No acute bony abnormality is noted. IMPRESSION: No active cardiopulmonary disease. Electronically Signed   By: Inez Catalina M.D.   On: 06/22/2017 19:17   Ct Head Wo Contrast  Result Date: 06/22/2017 CLINICAL DATA:  Subacute onset of headache.  Initial encounter. EXAM: CT HEAD WITHOUT CONTRAST TECHNIQUE: Contiguous axial images were obtained from the base of the skull through the vertex without intravenous contrast. COMPARISON:  CT of the head performed 03/11/2014 FINDINGS: Brain: No evidence of acute infarction, hemorrhage, hydrocephalus, extra-axial collection or mass lesion/mass effect. Mild periventricular white matter change likely reflects small vessel ischemic microangiopathy. The posterior fossa, including the cerebellum, brainstem and fourth ventricle, is within normal limits. The third and lateral ventricles, and basal ganglia are unremarkable in appearance. The cerebral hemispheres are symmetric in appearance, with normal gray-white differentiation. No mass effect or midline shift is seen.  Vascular: No hyperdense vessel or unexpected calcification. Skull: There is no evidence of fracture; visualized osseous structures are unremarkable in appearance. Sinuses/Orbits: Bilateral proptosis is noted. A mucus retention cyst or polyp is noted at the right maxillary sinus. The remaining paranasal sinuses and mastoid air cells are well-aerated. Other: No significant soft tissue abnormalities are seen. IMPRESSION: 1. No acute intracranial pathology seen on CT. 2. Mild small vessel ischemic microangiopathy. 3. Bilateral proptosis. 4. Mucus retention cyst or polyp at the right maxillary sinus. Electronically Signed   By: Garald Balding M.D.   On: 06/22/2017 23:46   Mr Brain Wo Contrast  Result Date: 06/23/2017 CLINICAL DATA:  Headache.  Left-sided numbness. EXAM: MRI HEAD WITHOUT CONTRAST TECHNIQUE: Multiplanar, multiecho pulse sequences of the brain and surrounding structures were obtained without intravenous contrast. COMPARISON:  CT 06/22/2017.  MRI 09/22/2010. FINDINGS: Brain: Diffusion imaging does not show any acute or subacute infarction. The brainstem and cerebellum are normal. Cerebral  hemispheres show minimal chronic small-vessel ischemic changes of the hemispheric white matter. There is a chronic cavernous angioma in the right frontal lobe without evidence of acute hemorrhage or edema. No neoplastic mass lesion, hydrocephalus or extra-axial collection. Vascular: Major vessels at the base of the brain show flow. Skull and upper cervical spine: Negative Sinuses/Orbits: There are inflammatory changes of the left division of the sphenoid sinus that could be associated with headache. Incidental retention cyst right maxillary sinus. Small mastoid effusions. Other: None IMPRESSION: No evidence of acute infarction or other acute intracranial pathology. Mild chronic small-vessel ischemic change of the cerebral hemispheric white matter. 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 Left  maxillary sinus inflammatory changes that could be associated with headache. Electronically Signed   By: Nelson Chimes M.D.   On: 06/23/2017 16:05     Subjective: Pt awake, alert, NAD, feeling much better, numbness resolved  Discharge Exam: Vitals:   06/23/17 1200 06/23/17 1614  BP: 129/80 (!) 154/73  Pulse: 77 78  Resp: (!) 25 14  Temp:  98.9 F (37.2 C)  SpO2: 100% 100%   Vitals:   06/23/17 0800 06/23/17 0850 06/23/17 1200 06/23/17 1614  BP: 123/71 113/90 129/80 (!) 154/73  Pulse:   77 78  Resp: 13 17 (!) 25 14  Temp:    98.9 F (37.2 C)  TempSrc:    Oral  SpO2:   100% 100%  Weight:      Height:       General: Pt is alert, awake, not in acute distress HEENT: tender right maxillary sinus Cardiovascular: RRR, S1/S2 +, no rubs, no gallops Respiratory: CTA bilaterally, no wheezing, no rhonchi Abdominal: Soft, NT, ND, bowel sounds + Extremities: no edema, no cyanosis Neurological: Nonfocal   The results of significant diagnostics from this hospitalization (including imaging, microbiology, ancillary and laboratory) are listed below for reference.     Microbiology: Recent Results (from the past 240 hour(s))  MRSA PCR Screening     Status: Abnormal   Collection Time: 06/23/17  4:59 AM  Result Value Ref Range Status   MRSA by PCR INVALID RESULTS, SPECIMEN SENT FOR CULTURE (A) NEGATIVE Final    Comment: K. CLOER [id number] 08.19.2018 N. MORRIS        The GeneXpert MRSA Assay (FDA approved for NASAL specimens only), is one component of a comprehensive MRSA colonization surveillance program. It is not intended to diagnose MRSA infection nor to guide or monitor treatment for MRSA infections.      Labs: BNP (last 3 results)  Recent Labs  09/13/16 1858  BNP 09.4   Basic Metabolic Panel:  Recent Labs Lab 06/22/17 1825  NA 134*  K 4.1  CL 98*  CO2 25  GLUCOSE 236*  BUN 19  CREATININE 1.09*  CALCIUM 9.0   Liver Function Tests: No results for input(s): AST,  ALT, ALKPHOS, BILITOT, PROT, ALBUMIN in the last 168 hours. No results for input(s): LIPASE, AMYLASE in the last 168 hours. No results for input(s): AMMONIA in the last 168 hours. CBC:  Recent Labs Lab 06/22/17 1825 06/23/17 0434  WBC 7.4 6.8  NEUTROABS  --  3.3  HGB 9.9* 9.7*  HCT 32.0* 30.9*  MCV 63.7* 63.4*  PLT 346 290   Cardiac Enzymes:  Recent Labs Lab 06/23/17 0147 06/23/17 0434  TROPONINI <0.03 <0.03  <0.03   BNP: Invalid input(s): POCBNP CBG:  Recent Labs Lab 06/23/17 0435 06/23/17 0802 06/23/17 1601  GLUCAP 201* 210* 279*  D-Dimer No results for input(s): DDIMER in the last 72 hours. Hgb A1c No results for input(s): HGBA1C in the last 72 hours. Lipid Profile  Recent Labs  06/23/17 0434  CHOL 179  HDL 33*  LDLCALC 112*  TRIG 170*  CHOLHDL 5.4   Thyroid function studies No results for input(s): TSH, T4TOTAL, T3FREE, THYROIDAB in the last 72 hours.  Invalid input(s): FREET3 Anemia work up  Recent Labs  06/23/17 0434  TIBC 398  IRON 32   Urinalysis    Component Value Date/Time   COLORURINE YELLOW 03/11/2017 1906   APPEARANCEUR HAZY (A) 03/11/2017 1906   LABSPEC 1.026 03/11/2017 1906   PHURINE 5.0 03/11/2017 1906   GLUCOSEU NEGATIVE 03/11/2017 1906   GLUCOSEU NEG mg/dL 10/28/2007 1123   HGBUR NEGATIVE 03/11/2017 1906   HGBUR negative 10/28/2007 [place] 03/11/2017 1906   KETONESUR NEGATIVE 03/11/2017 1906   PROTEINUR 100 (A) 03/11/2017 1906   UROBILINOGEN 0.2 08/26/2015 2120   NITRITE NEGATIVE 03/11/2017 1906   LEUKOCYTESUR LARGE (A) 03/11/2017 1906   Sepsis Labs Invalid input(s): PROCALCITONIN,  WBC,  LACTICIDVEN Microbiology Recent Results (from the past 240 hour(s))  MRSA PCR Screening     Status: Abnormal   Collection Time: 06/23/17  4:59 AM  Result Value Ref Range Status   MRSA by PCR INVALID RESULTS, SPECIMEN SENT FOR CULTURE (A) NEGATIVE Final    Comment: K. CLOER [id number] 08.19.2018 N. MORRIS         The GeneXpert MRSA Assay (FDA approved for NASAL specimens only), is one component of a comprehensive MRSA colonization surveillance program. It is not intended to diagnose MRSA infection nor to guide or monitor treatment for MRSA infections.    Time coordinating discharge:  SIGNED:  Irwin Brakeman, MD  [facility] 06/23/2017, 4:57 PM Pager [phone]  If 7PM-7AM, please contact night-coverage [email or web address] Password [email or web address]

## 2017-06-23 NOTE — Care Management Note (Signed)
Case Management Note Marvetta Gibbons RN, BSN Unit 4E-Case Manager [phone]  Patient Details  Name: Chelsea Lester MRN: [id number] Date of Birth: 07-Feb-1956  Subjective/Objective:     Pt presented with chest pain, placed in observation               Action/Plan: PTA pt lived at home- has cpap at home with [facility]- requesting assistance with getting cleaner solution for cpap- spoke with Leroy Sea at [facility]- pt will need to call [facility] when she gets home to ask for assistance in getting cleaner solution for home cpap- have placed # to call with ext. On AVS for pt to f/u when she gets home. CM will continue to follow.   Expected Discharge Date:                  Expected Discharge Plan:  Home/Self Care  In-House Referral:     Discharge planning Services  CM Consult, Other - See comment  Post Acute Care Choice:  NA Choice offered to:  NA  DME Arranged:    DME Agency:     HH Arranged:    HH Agency:     Status of Service:  In process, will continue to follow  If discussed at Long Length of Stay Meetings, dates discussed:    Discharge Disposition:   Additional Comments:  Dawayne Patricia, RN 06/23/2017, 9:43 AM

## 2017-06-23 NOTE — Discharge Instructions (Signed)
Follow with Primary MD  Mack Hook, MD  and other consultant's as instructed your Hospitalist MD  Please get a complete blood count and chemistry panel checked by your Primary MD at your next visit, and again as instructed by your Primary MD.  Get Medicines reviewed and adjusted: Please take all your medications with you for your next visit with your Primary MD  Laboratory/radiological data: Please request your Primary MD to go over all hospital tests and procedure/radiological results at the follow up, please ask your Primary MD to get all Hospital records sent to his/her office.  In some cases, they will be blood work, cultures and biopsy results pending at the time of your discharge. Please request that your primary care M.D. follows up on these results.  Also Note the following: If you experience worsening of your admission symptoms, develop shortness of breath, life threatening emergency, suicidal or homicidal thoughts you must seek medical attention immediately by calling 911 or calling your MD immediately  if symptoms less severe.  You must read complete instructions/literature along with all the possible adverse reactions/side effects for all the Medicines you take and that have been prescribed to you. Take any new Medicines after you have completely understood and accpet all the possible adverse reactions/side effects.   Do not drive when taking Pain medications or sleeping medications (Benzodaizepines)  Do not take more than prescribed Pain, Sleep and Anxiety Medications. It is not advisable to combine anxiety,sleep and pain medications without talking with your primary care practitioner  Special Instructions: If you have smoked or chewed Tobacco  in the last 2 yrs please stop smoking, stop any regular Alcohol  and or any Recreational drug use.  Wear Seat belts while driving.  Please note: You were cared for by a hospitalist during your hospital stay. Once you are  discharged, your primary care physician will handle any further medical issues. Please note that NO REFILLS for any discharge medications will be authorized once you are discharged, as it is imperative that you return to your primary care physician (or establish a relationship with a primary care physician if you do not have one) for your post hospital discharge needs so that they can reassess your need for medications and monitor your lab values.

## 2017-06-23 NOTE — H&P (Signed)
History and Physical    Chelsea Lester NWG:[id number] DOB: 08/04/1956 DOA: 06/22/2017  Referring MD/NP/PA:Dr. Ward  PCP: Mack Hook, MD  Patient coming from:  Home   Chief Complaint: Chest pain  HPI: Chelsea Lester is a 61 y.o. female with medical history significant of HTN, HLD, CAD s/p stent, diabetes mellitus type 2, morbid obesity, OSA on CPAP  Who presents with complaints of chest pain over the last 1 week. Patient reports having left-sided chest pain that she describes as dull to sharp with radiation down the left arm. Symptoms can occur at rest and with activity and lasts for minutes. Initially patient was utilizing nitroglycerin, but reported no change in symptoms. Because of the nitroglycerin patient complains of having a headache. Associated symptoms include shortness of breath, fatigue, diaphoresis, nausea, and at least one episode of vomiting. Patient reports having right lower extremity swelling for years evaluated with a Doppler ultrasound on 05/27/2017 negative for signs of a DVT. Denies having any blood in stools, loss of consciousness, palpitations, change in vision, or slurred speech. Followed by Dr. Doylene Canard of cardiology.  ED Course:      Review of Systems: ROS  Past Medical History:  Diagnosis Date  . Abnormal findings-skull or head    "/Dr. 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 Anginal pain ([place])    LAST CP TODAY  . Anxiety   . Asthma   . Blood dyscrasia   . CAD (coronary artery disease)   . Cataract   . CHF (congestive heart failure) ([place])   . Chronic bronchitis ([place])    "get it q year"  . Chronic lower back pain   . Colon polyp   . Complication of anesthesia    "couldn't get me woke when they put m3e to sleep for my teeth"  . Dental caries    periodontitis, lesion left mandible  . Depression 11/07   Hospitalization required  . Diverticulosis   . Dyspnea    W/ EXERTION      . Esophageal stricture    . Fibromyalgia   . GERD (gastroesophageal reflux disease)   . Gout   . Headache    "weekly" (08/26/2015)     LITTLE BETTER  09/18/16  . Hemorrhoids   . History of hiatal hernia   . Hyperlipidemia   . Hypertension   . IBS (irritable bowel syndrome)   . Lupus   . Microcytosis   . Myocardial infarction ([place])    "slight one years ago"  . Obesity   . OSA on CPAP    CPAP AT NIGHT W/ O2 PRN  . Osteoarthritis    "all over"  . Pneumonia "several times"  . PONV (postoperative nausea and vomiting)   . Seizures ([place])   . Sickle-cell trait ([place])   . Sleep disorder   . Spinal headache   . Thyroid disease    "? high or low" (08/26/2015)  . Type II diabetes mellitus ([place])     Past Surgical History:  Procedure Laterality Date  . ABDOMINAL HYSTERECTOMY    . CARDIAC CATHETERIZATION N/A 09/20/2016   Procedure: Left Heart Cath and Coronary Angiography;  Surgeon: Dixie Dials, MD;  Location: [facility];  Service: Cardiovascular;  Laterality: N/A;  . COLONOSCOPY    . CORONARY ANGIOPLASTY WITH STENT PLACEMENT     "I''ve had alot"  . DILATION AND CURETTAGE OF UTERUS    . ESOPHAGOGASTRODUODENOSCOPY (EGD) WITH ESOPHAGEAL DILATION  "  all the time"  . EYE SURGERY Bilateral    "had surgery on the sides of my eyes to keep them popping out"  . head surgery     metal plate placed  . MULTIPLE EXTRACTIONS WITH ALVEOLOPLASTY N/A 10/19/2016   Procedure: MULTIPLE EXTRACTION WITH ALVEOLOPLASTY;  Surgeon: Diona Browner, DDS;  Location: [facility];  Service: Oral Surgery;  Laterality: N/A;  . MULTIPLE TOOTH EXTRACTIONS  ?1980's   "& cut into my jaw"  . TONSILLECTOMY    . TUBAL LIGATION    . UPPER GASTROINTESTINAL ENDOSCOPY       reports that she has never smoked. She has never used smokeless tobacco. She reports that she does not drink alcohol or use drugs.  Allergies  Allergen Reactions  . Ace Inhibitors Cough    UNSPECIFIED REACTION   . Latex Rash  . Percocet [Oxycodone-Acetaminophen]  Itching         Family History  Problem Relation Age of Onset  . Rectal cancer Sister   . Lung cancer Father   . Esophageal cancer Father   . Breast cancer Sister 66  . Breast cancer Mother 56  . Colon cancer Other        Aunt, Uncle  . Liver cancer Brother        Nephew  . Diabetes Sister   . Diabetes Brother   . Diabetes Other        son & daughter  . Heart disease Sister   . Heart disease Brother   . Kidney disease Sister   . Breast cancer Maternal Aunt        patient reports 11 mat aunts with breast cancer  . Breast cancer Paternal Aunt 85  . Breast cancer Maternal Grandmother        unk age    Prior to Admission medications   Medication Sig Start Date End Date Taking? Authorizing Provider  albuterol (PROVENTIL,VENTOLIN) 90 MCG/ACT inhaler Inhale 2 puffs into the lungs every 6 (six) hours as needed for shortness of breath. Wheezing or shortness of breath   Yes [provider]  allopurinol (ZYLOPRIM) 100 MG tablet Take 1 tablet (100 mg total) by mouth daily. 05/20/17  Yes Mack Hook, MD  amLODipine (NORVASC) 10 MG tablet Take 1 tablet (10 mg total) by mouth daily. 05/20/17  Yes Mack Hook, MD  antipyrine-benzocaine Toniann Fail) otic solution Place 3-4 drops into both ears every 2 (two) hours as needed for ear pain. 02/15/17  Yes Patrecia Pour, MD  aspirin 81 MG tablet Take 81 mg by mouth every morning.    Yes [provider]  baclofen (LIORESAL) 20 MG tablet Take 20 mg by mouth 2 (two) times daily. 03/06/17  Yes [provider]  citalopram (CELEXA) 10 MG tablet Take 1 tablet (10 mg total) by mouth daily. 05/20/17  Yes Mack Hook, MD  COMBIGAN 0.2-0.5 % ophthalmic solution Place 1 drop into both eyes every 12 (twelve) hours. 05/20/17  Yes Mack Hook, MD  diclofenac sodium (VOLTAREN) 1 % GEL Apply 2 g topically 4 (four) times daily as needed (pain). 05/20/17  Yes Mack Hook, MD  docusate sodium (COLACE) 100 MG  capsule Take 1 capsule (100 mg total) by mouth daily. 05/20/17  Yes Mack Hook, MD  ergocalciferol (VITAMIN D2) 50000 units capsule Take 1 capsule (50,000 Units total) by mouth once a week. Usually takes on Saturday or fridays 05/20/17  Yes Mack Hook, MD  fenofibrate (TRICOR) 145 MG tablet Take 1 tablet (145 mg  total) by mouth daily. 05/20/17  Yes Mack Hook, MD  ferrous sulfate 325 (65 FE) MG tablet Take 1 tablet (325 mg total) by mouth daily with breakfast. 05/20/17  Yes Mack Hook, MD  fluticasone Asencion Islam) 50 MCG/ACT nasal spray 2 sprays each nostril once daily 05/20/17  Yes Mack Hook, MD  fluticasone-salmeterol (ADVAIR HFA) 115-21 MCG/ACT inhaler Inhale 2 puffs into the lungs 2 (two) times daily. 05/20/17  Yes Mack Hook, MD  insulin NPH-regular Human (NOVOLIN 70/30) (70-30) 100 UNIT/ML injection Inject 60 units BID 05/20/17  Yes Mack Hook, MD  ipratropium (ATROVENT) 0.02 % nebulizer solution Take 2.5 mLs (0.5 mg total) by nebulization 3 (three) times daily. Nebulize 0.5 mg three times daily 05/20/17  Yes Mack Hook, MD  ipratropium (ATROVENT) 0.03 % nasal spray Place 2 sprays into both nostrils every 12 (twelve) hours. 05/20/17  Yes Mack Hook, MD  Lancets MISC Check sugars twice daily before meals 05/20/17  Yes Mack Hook, MD  Latanoprostene Bunod (VYZULTA) 0.024 % SOLN Apply to eye. 1 drop each eye twice a day   Yes [provider]  loratadine (CLARITIN) 10 MG tablet Take 1 tablet (10 mg total) by mouth every morning. 05/20/17  Yes Mack Hook, MD  metFORMIN (GLUCOPHAGE) 500 MG tablet 1 tab po bid with meals 05/20/17  Yes Mack Hook, MD  methocarbamol (ROBAXIN) 500 MG tablet Take 1 tablet (500 mg total) by mouth 2 (two) times daily. 03/22/14  Yes Antonietta Breach, PA-C  metoprolol tartrate (LOPRESSOR) 50 MG tablet Take 1 tablet (50 mg total) by mouth 2 (two) times daily. 05/20/17  Yes Mack Hook, MD  nitroGLYCERIN (NITROSTAT) 0.4 MG SL tablet Place 0.4 mg under the tongue every 5 (five) minutes as needed for chest pain.  09/07/16  Yes [provider]  pantoprazole (PROTONIX) 40 MG tablet Take 1 tablet (40 mg total) by mouth daily. 30 mins prior to breakfast. 05/20/17  Yes Mack Hook, MD  polyethylene glycol (MIRALAX / GLYCOLAX) packet Take 17 g by mouth daily. Patient taking differently: Take 17 g by mouth daily as needed for mild constipation.  05/20/17  Yes Mack Hook, MD  potassium chloride (K-DUR) 10 MEQ tablet Take 1 tablet (10 mEq total) by mouth daily. 05/20/17  Yes Mack Hook, MD  promethazine (PHENERGAN) 25 MG tablet take 1/2 tablet by mouth every 6 hours if needed for nausea or vomiting 06/19/17  Yes Mack Hook, MD  RESTASIS 0.05 % ophthalmic emulsion Place 1 drop into both eyes 2 (two) times daily.  09/10/16  Yes [provider]  simvastatin (ZOCOR) 40 MG tablet Take 1 tablet (40 mg total) by mouth at bedtime. 05/20/17  Yes Mack Hook, MD  SUBOXONE 8-2 MG FILM Take 1 Film by mouth 2 (two) times daily. 03/08/17  Yes [provider]  tobramycin (TOBREX) 0.3 % ophthalmic ointment Place 1 application into both eyes 4 (four) times daily.    Yes [provider]  travoprost, benzalkonium, (TRAVATAN) 0.004 % ophthalmic solution Place 1 drop into both eyes at bedtime. 08/28/15  Yes Charlynne Cousins, MD    Physical Exam:  Constitutional:Morbidly obese female NAD, calm, comfortable Vitals:   06/23/17 0012 06/23/17 0023 06/23/17 0025 06/23/17 0035  BP: 135/81 138/87 (!) 143/85 131/86  Pulse:      Resp:      Temp:      TempSrc:      SpO2:      Weight:      Height:  Eyes: PERRL, lids and conjunctivae normal ENMT: Mucous membranes are moist. Posterior pharynx clear of any exudate or lesions.Normal dentition.  Neck: normal, supple, no masses, no thyromegaly Respiratory: clear to auscultation  bilaterally, no wheezing, no crackles. Normal respiratory effort. No accessory muscle use.  Cardiovascular: Regular rate and rhythm, no murmurs / rubs / gallops. Right lower extremity twice size of left lower extremity. 2+ pedal pulses. No carotid bruits.  Tenderness to palpation of the left-side of the upper chest wall, but reports not similar to chest pain symptoms Abdomen: no tenderness, no masses palpated. No hepatosplenomegaly. Bowel sounds positive.  Musculoskeletal: no clubbing / cyanosis. No joint deformity upper and lower extremities. Good ROM, no contractures. Normal muscle tone.  Skin: no rashes, lesions, ulcers. No induration Neurologic: CN 2-12 grossly intact. Sensation abnormal, DTR normal. Strength 5/5 in all 4 with no appreciable difference in strength Psychiatric: Normal judgment and insight. Alert and oriented x 3. Normal mood.     Labs on Admission: I have personally reviewed following labs and imaging studies  CBC:  Recent Labs Lab 06/22/17 1825  WBC 7.4  HGB 9.9*  HCT 32.0*  MCV 63.7*  PLT 259   Basic Metabolic Panel:  Recent Labs Lab 06/22/17 1825  NA 134*  K 4.1  CL 98*  CO2 25  GLUCOSE 236*  BUN 19  CREATININE 1.09*  CALCIUM 9.0   GFR: Estimated Creatinine Clearance: 77.4 mL/min (A) (by C-G formula based on SCr of 1.09 mg/dL (H)). Liver Function Tests: No results for input(s): AST, ALT, ALKPHOS, BILITOT, PROT, ALBUMIN in the last 168 hours. No results for input(s): LIPASE, AMYLASE in the last 168 hours. No results for input(s): AMMONIA in the last 168 hours. Coagulation Profile: No results for input(s): INR, PROTIME in the last 168 hours. Cardiac Enzymes: No results for input(s): CKTOTAL, CKMB, CKMBINDEX, TROPONINI in the last 168 hours. BNP (last 3 results) No results for input(s): PROBNP in the last 8760 hours. HbA1C: No results for input(s): HGBA1C in the last 72 hours. CBG: No results for input(s): GLUCAP in the last 168 hours. Lipid  Profile: No results for input(s): CHOL, HDL, LDLCALC, TRIG, CHOLHDL, LDLDIRECT in the last 72 hours. Thyroid Function Tests: No results for input(s): TSH, T4TOTAL, FREET4, T3FREE, THYROIDAB in the last 72 hours. Anemia Panel: No results for input(s): VITAMINB12, FOLATE, FERRITIN, TIBC, IRON, RETICCTPCT in the last 72 hours. Urine analysis:    Component Value Date/Time   COLORURINE YELLOW 03/11/2017 1906   APPEARANCEUR HAZY (A) 03/11/2017 1906   LABSPEC 1.026 03/11/2017 1906   PHURINE 5.0 03/11/2017 1906   GLUCOSEU NEGATIVE 03/11/2017 1906   GLUCOSEU NEG mg/dL 10/28/2007 1123   HGBUR NEGATIVE 03/11/2017 1906   HGBUR negative 10/28/2007 1023   BILIRUBINUR NEGATIVE 03/11/2017 1906   KETONESUR NEGATIVE 03/11/2017 1906   PROTEINUR 100 (A) 03/11/2017 1906   UROBILINOGEN 0.2 08/26/2015 2120   NITRITE NEGATIVE 03/11/2017 1906   LEUKOCYTESUR LARGE (A) 03/11/2017 1906   Sepsis Labs: No results found for this or any previous visit (from the past 240 hour(s)).   Radiological Exams on Admission: Dg Chest 2 View  Result Date: 06/22/2017 CLINICAL DATA:  Chest pain and shortness of Breath EXAM: CHEST  2 VIEW COMPARISON:  09/13/2016 FINDINGS: Cardiac shadow is at the upper limits of normal in size but stable. The lungs are well aerated bilaterally. No focal infiltrate or sizable effusion is seen. No acute bony abnormality is noted. IMPRESSION: No active cardiopulmonary disease. Electronically Signed   By:  Inez Catalina M.D.   On: 06/22/2017 19:17   Ct Head Wo Contrast  Result Date: 06/22/2017 CLINICAL DATA:  Subacute onset of headache.  Initial encounter. EXAM: CT HEAD WITHOUT CONTRAST TECHNIQUE: Contiguous axial images were obtained from the base of the skull through the vertex without intravenous contrast. COMPARISON:  CT of the head performed 03/11/2014 FINDINGS: Brain: No evidence of acute infarction, hemorrhage, hydrocephalus, extra-axial collection or mass lesion/mass effect. Mild  periventricular white matter change likely reflects small vessel ischemic microangiopathy. The posterior fossa, including the cerebellum, brainstem and fourth ventricle, is within normal limits. The third and lateral ventricles, and basal ganglia are unremarkable in appearance. The cerebral hemispheres are symmetric in appearance, with normal gray-white differentiation. No mass effect or midline shift is seen. Vascular: No hyperdense vessel or unexpected calcification. Skull: There is no evidence of fracture; visualized osseous structures are unremarkable in appearance. Sinuses/Orbits: Bilateral proptosis is noted. A mucus retention cyst or polyp is noted at the right maxillary sinus. The remaining paranasal sinuses and mastoid air cells are well-aerated. Other: No significant soft tissue abnormalities are seen. IMPRESSION: 1. No acute intracranial pathology seen on CT. 2. Mild small vessel ischemic microangiopathy. 3. Bilateral proptosis. 4. Mucus retention cyst or polyp at the right maxillary sinus. Electronically Signed   By: Garald Balding M.D.   On: 06/22/2017 23:46    EKG: Independently reviewed. Normal sinus rhythm  Assessment/Plan Chest pain: Acute. Patient reports having chest pain at rest and with exertion. Initial workup and negative troponins 2 and EKG without any signs of any ischemic changes. Heart score 5. - Admit to a stepdown bed - Checking cardiac enzymes - fentanyl prn pain - Check echocardiogram in a.m. - Formally consult cardiology, if warranted   Left-sided numbness and headache: Acute. Could be related to chest pain symptoms versus possibility of stroke. - Neuro checks - Follow-up MRI, and will consult neurology if needed  Essential hypertension - Continue amlodipine  Microcytic microchromic anemia: Chronic. Hemoglobin 9.9 on admission with low MCV and MCH. Patient's baseline hemoglobin is 9-10. - Check CBC, iron, and TIBC in a.m.  Diabetes mellitus type 2,  uncontrolled: Last hemoglobin A1c on file was noted to be 8.1 back in 10/2016. Patient presents with a blood sugar elevated to 236 on admission. - Hypoglycemic protocols  -  continue 70/30 insulin at half home dose 30 units subcutaneous twice a day while NPO. - Adjust insulin regimen back to normal when able to eat  normally  Morbid Obesity BMI 46.3  OSA on CPAP - CPAP per RT    DVT prophylaxis: lovenox  Code Status: Full  Family Communication: Discuss plan of care with the patient's family present at bedside.  Disposition Plan: Discharge home if workup negative  Consults called: none Admission status: Observation   Norval Morton MD [facility] Pager [phone]   If 7PM-7AM, please contact night-coverage [email or web address] Password [email or web address]  06/23/2017, 1:08 AM

## 2017-06-24 LAB — MRSA CULTURE: CULTURE: NOT DETECTED

## 2017-06-25 ENCOUNTER — Encounter: Payer: Self-pay | Admitting: Internal Medicine

## 2017-06-25 ENCOUNTER — Ambulatory Visit (INDEPENDENT_AMBULATORY_CARE_PROVIDER_SITE_OTHER): Payer: Medicaid Other | Admitting: Internal Medicine

## 2017-06-25 VITALS — BP 140/90 | HR 78 | Resp 14 | Ht 64.0 in | Wt 282.0 lb

## 2017-06-25 DIAGNOSIS — Z794 Long term (current) use of insulin: Secondary | ICD-10-CM

## 2017-06-25 DIAGNOSIS — R55 Syncope and collapse: Secondary | ICD-10-CM | POA: Diagnosis not present

## 2017-06-25 DIAGNOSIS — E118 Type 2 diabetes mellitus with unspecified complications: Secondary | ICD-10-CM | POA: Diagnosis not present

## 2017-06-25 DIAGNOSIS — G4733 Obstructive sleep apnea (adult) (pediatric): Secondary | ICD-10-CM | POA: Diagnosis not present

## 2017-06-25 DIAGNOSIS — G8929 Other chronic pain: Secondary | ICD-10-CM | POA: Diagnosis not present

## 2017-06-25 DIAGNOSIS — I10 Essential (primary) hypertension: Secondary | ICD-10-CM

## 2017-06-25 DIAGNOSIS — E782 Mixed hyperlipidemia: Secondary | ICD-10-CM

## 2017-06-25 LAB — GLUCOSE, POCT (MANUAL RESULT ENTRY): POC GLUCOSE: 260 mg/dL — AB (ref 70–99)

## 2017-06-25 MED ORDER — FERROUS SULFATE 325 (65 FE) MG PO TABS: ORAL_TABLET | ORAL | 11 refills | Status: DC
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
## 2017-06-26 LAB — HGB A1C W/O EAG: Hgb A1c MFr Bld: 7.8 % — ABNORMAL HIGH (ref 4.8–5.6)

## 2017-07-02 ENCOUNTER — Telehealth: Payer: Self-pay

## 2017-07-02 NOTE — Telephone Encounter (Signed)
Holly with [facility] called asking for a new referral for Chelsea Lester to go to [facility] for pain management. States patient was seeing someone else for her pain medication, but he is not working for the patient. Would like referral Faxed to [phone]. Earnest Bailey states she spoke with someone at [facility] and they are willing to see the patient for pain management. Earnest Bailey would like a return call once this has been done. Holly's number is [phone]. To Dr. Amil Amen for further direction.

## 2017-07-04 NOTE — Telephone Encounter (Signed)
[facility] returned call earlier in week, stated Dr. Primus Bravo had ordered an MRI, which was completed and was not moving on the results.   Patient and husband feel they have done all that he has asked and yet not receiving pain control medication. Discussed much of her history is contradictory and does not make sense.   I was not aware she had had an MRI done through his office. I also am not aware of what her source of pain is and will need to discuss with Dr. Primus Bravo, but would not have a problem with a pain referral if needed to another clinic.  Discussed I would call Dr. Ethel Rana office to clarify what his plans are.  Called and left message with Dr. Ethel Rana office.   Received return call from Dr. Primus Bravo:  He is not finding a definitive source of chronic pain with the patient and has not been able to get a good history from her. He did not order an MRI, but I did note she had had an MRI of her head with a recent ED visit. It did not show a metal plate in her skull as she apparently documented in [place] intake paperwork with Dr. Primus Bravo.  Discussed not clear if disinformation is purposeful or if patient is confabulating as other medical info has been very contradictory recently as well.    Left a message with Earnest Bailey again to call and discuss so I can get her input.   Holding on any further pain clinic referral.

## 2017-07-05 ENCOUNTER — Other Ambulatory Visit: Payer: Self-pay | Admitting: Internal Medicine

## 2017-07-09 ENCOUNTER — Ambulatory Visit (INDEPENDENT_AMBULATORY_CARE_PROVIDER_SITE_OTHER): Payer: Medicaid Other | Admitting: Sports Medicine

## 2017-07-09 DIAGNOSIS — B351 Tinea unguium: Secondary | ICD-10-CM | POA: Diagnosis not present

## 2017-07-09 DIAGNOSIS — M79676 Pain in unspecified toe(s): Secondary | ICD-10-CM | POA: Diagnosis not present

## 2017-07-09 DIAGNOSIS — E1142 Type 2 diabetes mellitus with diabetic polyneuropathy: Secondary | ICD-10-CM

## 2017-07-09 DIAGNOSIS — M79609 Pain in unspecified limb: Principal | ICD-10-CM

## 2017-07-09 NOTE — Progress Notes (Signed)
Patient ID: Chelsea Lester, female   DOB: 07-22-1956, 61 y.o.   MRN: [id number] Subjective: Chelsea Lester is a 61 y.o. female patient with history of diabetes who returns to office today complaining of long, painful nails  while ambulating in shoes; unable to trim. Patient states that the glucose reading this morning was "good", 86. Admits that she is getting dark areas on sides of feet. Patient denies any other issues.   To see PCP next week.   Patient Active Problem List   Diagnosis Date Noted  . Chest pain 06/23/2017  . Left sided numbness 06/23/2017  . Chest pain, precordial 09/18/2016    Class: Acute  . Precordial chest pain 09/18/2016  . Acute encephalopathy   . Hyperglycemia 08/26/2015  . Chronic high back pain 08/26/2015  . AKI (acute kidney injury) ([place]) 08/26/2015  . Prolonged Q-T interval on ECG 08/26/2015  . OSA (obstructive sleep apnea) 08/26/2015  . Chest pain at rest 03/09/2014  . Family history of breast cancer 03/23/2013  . Family history of GI tract cancer 03/23/2013  . Dyspnea 05/02/2012  . Pulmonary infiltrates 05/02/2012  . Cough 05/02/2012  . Family history of malignant neoplasm of gastrointestinal tract 07/25/2011  . Abdominal pain, epigastric 07/25/2011  . UNSPECIFIED IRON DEFICIENCY ANEMIA 03/23/2009  . Arthritis, degenerative 01/05/2009  . HEMORRHOIDS, INTERNAL 10/05/2008  . RHINITIS 01/14/2008  . HYPOTENSION 11/14/2007  . CELLULITIS AND ABSCESS OF TRUNK 11/14/2007  . HYPOKALEMIA 10/28/2007  . UTI 10/28/2007  . DIZZINESS 10/28/2007  . Irritable bowel syndrome 08/13/2007  . EXOPHTHALMOS NOS 05/26/2007  . BILIRUBINURIA 05/26/2007  . Bulging eyes 05/26/2007  . DRUG ABUSE 04/14/2007  . SINUSITIS, MAXILLARY, ACUTE 03/24/2007  . ANXIETY DISORDER, GENERALIZED 03/04/2007  . CALF PAIN 03/04/2007  . BENIGN NEOPLASM [place] DIGESTIVE SYSTEM 01/17/2007  . ESOPHAGEAL STRICTURE 01/17/2007  . HYPERLIPIDEMIA 01/14/2007  . INSOMNIA 12/03/2006  .  POLYP, COLON 09/12/2006  . Uncontrolled diabetes mellitus ([place]) 09/12/2006  . Obesity, Class III, BMI 40-49.9 (morbid obesity) ([place]) 09/12/2006  . Hypertension 09/12/2006  . Asthma 09/12/2006  . GERD 09/12/2006  . OSTEOARTHRITIS 09/12/2006  . MICROCYTOSIS 09/12/2006  . HYSTERECTOMY, HX OF 09/12/2006  . Essential (primary) hypertension 09/12/2006  . Type 2 diabetes mellitus ([place]) 09/12/2006   Current Outpatient Prescriptions on File Prior to Visit  Medication Sig Dispense Refill  . albuterol (PROVENTIL,VENTOLIN) 90 MCG/ACT inhaler Inhale 2 puffs into the lungs every 6 (six) hours as needed for shortness of breath. Wheezing or shortness of breath    . allopurinol (ZYLOPRIM) 100 MG tablet Take 1 tablet (100 mg total) by mouth daily. 30 tablet 11  . amLODipine (NORVASC) 10 MG tablet Take 1 tablet (10 mg total) by mouth daily. 30 tablet 11  . aspirin 81 MG tablet Take 81 mg by mouth every morning.     . baclofen (LIORESAL) 20 MG tablet Take 20 mg by mouth 2 (two) times daily.  0  . citalopram (CELEXA) 10 MG tablet Take 1 tablet (10 mg total) by mouth daily. 30 tablet 11  . COMBIGAN 0.2-0.5 % ophthalmic solution Place 1 drop into both eyes every 12 (twelve) hours. 15 mL 11  . diclofenac sodium (VOLTAREN) 1 % GEL Apply 2 g topically 4 (four) times daily as needed (pain). 100 g 6  . docusate sodium (COLACE) 100 MG capsule Take 1 capsule (100 mg total) by mouth daily. 30 capsule 11  . ergocalciferol (VITAMIN D2) 50000 units capsule Take 1 capsule (50,000 Units total) by  mouth once a week. Usually takes on Saturday or fridays 30 capsule 0  . fenofibrate (TRICOR) 145 MG tablet Take 1 tablet (145 mg total) by mouth daily. 30 tablet 11  . ferrous sulfate 325 (65 FE) MG tablet 1 tab twice daily with half and orange. 180 tablet 11  . fluticasone (FLONASE) 50 MCG/ACT nasal spray 2 sprays each nostril once daily 1 g 11  . fluticasone-salmeterol (ADVAIR HFA) 115-21 MCG/ACT inhaler Inhale 2 puffs into the  lungs 2 (two) times daily. 1 Inhaler 12  . insulin NPH-regular Human (NOVOLIN 70/30) (70-30) 100 UNIT/ML injection Inject 60 units BID 10 mL 11  . ipratropium (ATROVENT) 0.02 % nebulizer solution Take 2.5 mLs (0.5 mg total) by nebulization 3 (three) times daily. Nebulize 0.5 mg three times daily 225 mL 11  . ipratropium (ATROVENT) 0.03 % nasal spray Place 2 sprays into both nostrils every 12 (twelve) hours. 30 mL 11  . Lancets MISC Check sugars twice daily before meals 100 each 11  . Latanoprostene Bunod (VYZULTA) 0.024 % SOLN Apply to eye. 1 drop each eye twice a day    . loratadine (CLARITIN) 10 MG tablet Take 1 tablet (10 mg total) by mouth every morning. 30 tablet 11  . metFORMIN (GLUCOPHAGE) 500 MG tablet take 1 tablet by mouth twice a day with food 60 tablet 0  . metoprolol tartrate (LOPRESSOR) 50 MG tablet take 1 tablet by mouth twice a day 60 tablet 0  . nitroGLYCERIN (NITROSTAT) 0.4 MG SL tablet Place 0.4 mg under the tongue every 5 (five) minutes as needed for chest pain.   0  . pantoprazole (PROTONIX) 40 MG tablet Take 1 tablet (40 mg total) by mouth daily. 30 mins prior to breakfast. 30 tablet 6  . polyethylene glycol (MIRALAX / GLYCOLAX) packet Take 17 g by mouth daily. (Patient taking differently: Take 17 g by mouth daily as needed for mild constipation. ) 14 each 11  . potassium chloride (K-DUR) 10 MEQ tablet Take 1 tablet (10 mEq total) by mouth daily. 30 tablet 11  . promethazine (PHENERGAN) 25 MG tablet take 1/2 tablet by mouth every 6 hours if needed for nausea or vomiting 30 tablet 0  . RESTASIS 0.05 % ophthalmic emulsion Place 1 drop into both eyes 2 (two) times daily.   0  . simvastatin (ZOCOR) 40 MG tablet Take 1 tablet (40 mg total) by mouth at bedtime. 30 tablet 11  . travoprost, benzalkonium, (TRAVATAN) 0.004 % ophthalmic solution Place 1 drop into both eyes at bedtime. 2.5 mL 12   No current facility-administered medications on file prior to visit.    Allergies   Allergen Reactions  . Suboxone [Buprenorphine Hcl-Naloxone Hcl]     Itchy rash--difficult history.  . Ace Inhibitors Cough    UNSPECIFIED REACTION   . Latex Rash  . Percocet [Oxycodone-Acetaminophen] Itching         Recent Results (from the past 2160 hour(s))  POCT Glucose (CBG)-Manual entry (CPT [phone])     Status: Abnormal   Collection Time: 05/20/17 10:13 AM  Result Value Ref Range   POC Glucose 133 (A) 70 - 99 mg/dl  Basic metabolic panel     Status: Abnormal   Collection Time: 06/22/17  6:25 PM  Result Value Ref Range   Sodium 134 (L) 135 - 145 mmol/L   Potassium 4.1 3.5 - 5.1 mmol/L   Chloride 98 (L) 101 - 111 mmol/L   CO2 25 22 - 32 mmol/L   Glucose, Bld  236 (H) 65 - 99 mg/dL   BUN 19 6 - 20 mg/dL   Creatinine, Ser 1.09 (H) 0.44 - 1.00 mg/dL   Calcium 9.0 8.9 - 10.3 mg/dL   GFR calc non Af Amer 54 (L) >60 mL/min   GFR calc Af Amer >60 >60 mL/min    Comment: (NOTE) The eGFR has been calculated using the CKD EPI equation. This calculation has not been validated in all clinical situations. eGFR's persistently <60 mL/min signify possible Chronic Kidney Disease.    Anion gap 11 5 - 15  CBC     Status: Abnormal   Collection Time: 06/22/17  6:25 PM  Result Value Ref Range   WBC 7.4 4.0 - 10.5 K/uL   RBC 5.02 3.87 - 5.11 MIL/uL   Hemoglobin 9.9 (L) 12.0 - 15.0 g/dL   HCT 32.0 (L) 36.0 - 46.0 %   MCV 63.7 (L) 78.0 - 100.0 fL   MCH 19.7 (L) 26.0 - 34.0 pg   MCHC 30.9 30.0 - 36.0 g/dL   RDW 21.9 (H) 11.5 - 15.5 %   Platelets 346 150 - 400 K/uL  I-stat troponin, ED     Status: None   Collection Time: 06/22/17  6:34 PM  Result Value Ref Range   Troponin i, poc 0.00 0.00 - 0.08 ng/mL   Comment 3            Comment: Due to the release kinetics of cTnI, a negative result within the first hours of the onset of symptoms does not rule out myocardial infarction with certainty. If myocardial infarction is still suspected, repeat the test at appropriate intervals.    I-stat troponin, ED     Status: None   Collection Time: 06/22/17 10:54 PM  Result Value Ref Range   Troponin i, poc 0.00 0.00 - 0.08 ng/mL   Comment 3            Comment: Due to the release kinetics of cTnI, a negative result within the first hours of the onset of symptoms does not rule out myocardial infarction with certainty. If myocardial infarction is still suspected, repeat the test at appropriate intervals.   Troponin I-serum (0, 3, 6 hours)     Status: None   Collection Time: 06/23/17  1:47 AM  Result Value Ref Range   Troponin I <0.03 <0.03 ng/mL  Troponin I-serum (0, 3, 6 hours)     Status: None   Collection Time: 06/23/17  4:34 AM  Result Value Ref Range   Troponin I <0.03 <0.03 ng/mL  Troponin I-serum (0, 3, 6 hours)     Status: None   Collection Time: 06/23/17  4:34 AM  Result Value Ref Range   Troponin I <0.03 <0.03 ng/mL  CBC with Differential/Platelet     Status: Abnormal   Collection Time: 06/23/17  4:34 AM  Result Value Ref Range   WBC 6.8 4.0 - 10.5 K/uL   RBC 4.87 3.87 - 5.11 MIL/uL   Hemoglobin 9.7 (L) 12.0 - 15.0 g/dL   HCT 30.9 (L) 36.0 - 46.0 %   MCV 63.4 (L) 78.0 - 100.0 fL   MCH 19.9 (L) 26.0 - 34.0 pg   MCHC 31.4 30.0 - 36.0 g/dL   RDW 21.6 (H) 11.5 - 15.5 %   Platelets 290 150 - 400 K/uL    Comment: REPEATED TO VERIFY   Neutrophils Relative % 48 %   Lymphocytes Relative 46 %   Monocytes Relative 5 %   Eosinophils Relative  1 %   Basophils Relative 0 %   Neutro Abs 3.3 1.7 - 7.7 K/uL   Lymphs Abs 3.1 0.7 - 4.0 K/uL   Monocytes Absolute 0.3 0.1 - 1.0 K/uL   Eosinophils Absolute 0.1 0.0 - 0.7 K/uL   Basophils Absolute 0.0 0.0 - 0.1 K/uL   RBC Morphology TARGET CELLS   Iron and TIBC     Status: Abnormal   Collection Time: 06/23/17  4:34 AM  Result Value Ref Range   Iron 32 28 - 170 ug/dL   TIBC 398 250 - 450 ug/dL   Saturation Ratios 8 (L) 10.4 - 31.8 %   UIBC 366 ug/dL  Lipid panel     Status: Abnormal   Collection Time: 06/23/17  4:34  AM  Result Value Ref Range   Cholesterol 179 0 - 200 mg/dL   Triglycerides 170 (H) <150 mg/dL   HDL 33 (L) >40 mg/dL   Total CHOL/HDL Ratio 5.4 RATIO   VLDL 34 0 - 40 mg/dL   LDL Cholesterol 112 (H) 0 - 99 mg/dL    Comment:        Total Cholesterol/HDL:CHD Risk Coronary Heart Disease Risk Table                     Men   Women  1/2 Average Risk   3.4   3.3  Average Risk       5.0   4.4  2 X Average Risk   9.6   7.1  3 X Average Risk  23.4   11.0        Use the calculated Patient Ratio above and the CHD Risk Table to determine the patient's CHD Risk.        ATP III CLASSIFICATION (LDL):  <100     mg/dL   Optimal  100-129  mg/dL   Near or Above                    Optimal  130-159  mg/dL   Borderline  160-189  mg/dL   High  >190     mg/dL   Very High   Glucose, capillary     Status: Abnormal   Collection Time: 06/23/17  4:35 AM  Result Value Ref Range   Glucose-Capillary 201 (H) 65 - 99 mg/dL  MRSA PCR Screening     Status: Abnormal   Collection Time: 06/23/17  4:59 AM  Result Value Ref Range   MRSA by PCR INVALID RESULTS, SPECIMEN SENT FOR CULTURE (A) NEGATIVE    Comment: K. CLOER 1258 08.19.2018 N. MORRIS        The GeneXpert MRSA Assay (FDA approved for NASAL specimens only), is one component of a comprehensive MRSA colonization surveillance program. It is not intended to diagnose MRSA infection nor to guide or monitor treatment for MRSA infections.   MRSA culture     Status: None   Collection Time: 06/23/17  4:59 AM  Result Value Ref Range   Specimen Description NASAL SWAB    Special Requests NONE    Culture NO MRSA DETECTED    Report Status 06/24/2017 FINAL   Glucose, capillary     Status: Abnormal   Collection Time: 06/23/17  8:02 AM  Result Value Ref Range   Glucose-Capillary 210 (H) 65 - 99 mg/dL   Comment 1 Notify RN    Comment 2 Document in Chart   Glucose, capillary     Status: Abnormal   Collection  Time: 06/23/17  4:01 PM  Result Value Ref Range    Glucose-Capillary 279 (H) 65 - 99 mg/dL   Comment 1 Notify RN    Comment 2 Document in Chart   POCT Glucose (CBG)-Manual entry (CPT [phone])     Status: Abnormal   Collection Time: 06/25/17  9:34 AM  Result Value Ref Range   POC Glucose 260 (A) 70 - 99 mg/dl    Comment: non-fasting  Hgb A1c w/o eAG     Status: Abnormal   Collection Time: 06/25/17 11:55 AM  Result Value Ref Range   Hgb A1c MFr Bld 7.8 (H) 4.8 - 5.6 %    Comment:          Prediabetes: 5.7 - 6.4          Diabetes: >6.4          Glycemic control for adults with diabetes: <7.0     Objective: General: Patient is awake, alert, and oriented x 3 and in no acute distress.  Integument: Skin is warm, dry and supple bilateral. Nails are tender, long, thickened and dystrophic with subungual debris, consistent with onychomycosis, 1-5 bilateral. No signs of infection. No open lesions or preulcerative lesions present bilateral. Remaining integument unremarkable.  Vasculature:  Dorsalis Pedis pulse 1/4 bilateral. Posterior Tibial pulse  1/4 bilateral. Capillary fill time <3 sec 1-5 bilateral. Scant hair growth to the level of the digits.Temperature gradient within normal limits. No varicosities present bilateral. Trace edema present bilateral. Venous hyperpigmentation bilateral.   Neurology: The patient has intact sensation measured with a 5.07/10g Semmes Weinstein Monofilament at all pedal sites bilateral . Vibratory sensation diminished bilateral with tuning fork. No Babinski sign present bilateral. Subjective numbness bilateral.  Musculoskeletal: No symptomatic pedal deformities noted bilateral. Muscular strength 5/5 in all lower extremity muscular groups bilateral without pain on range of motion . No tenderness with calf compression bilateral.  Assessment and Plan: Problem List Items Addressed This Visit    None    Visit Diagnoses    Pain due to onychomycosis of nail    -  Primary   Diabetic polyneuropathy associated with type  2 diabetes mellitus ([place])         -Examined patient. -Discussed and educated patient on diabetic foot care, especially with  regards to the vascular, neurological and musculoskeletal systems.  -Stressed the importance of good glycemic control and the detriment of not controlling glucose levels in relation to the foot. -Mechanically debrided all nails 1-5 bilateral using sterile nail nipper and filed with dremel without incident  -Answered all patient questions -Patient to return  in 3 months for at risk foot care -Patient advised to call the office if any problems or questions arise in the meantime.  Landis Martins, DPM

## 2017-07-10 ENCOUNTER — Other Ambulatory Visit (INDEPENDENT_AMBULATORY_CARE_PROVIDER_SITE_OTHER): Payer: Medicaid Other

## 2017-07-10 VITALS — BP 140/80 | HR 90

## 2017-07-10 DIAGNOSIS — I10 Essential (primary) hypertension: Secondary | ICD-10-CM

## 2017-07-10 NOTE — Progress Notes (Signed)
Per Dr.Mulberry have patient continue on current dose of medication and will recheck at next OV. Patient informed.

## 2017-07-12 NOTE — Telephone Encounter (Signed)
Spoke with Chelsea Lester again on Monday of this week (5 days ago) and shared my discussion with Dr. Primus Bravo and that I would not be referring Chelsea Lester to another pain clinic until we can get to know each other better and figure out what her pain consists of. Chelsea Lester agreed with the plan

## 2017-07-14 ENCOUNTER — Encounter: Payer: Self-pay | Admitting: Internal Medicine

## 2017-07-21 ENCOUNTER — Other Ambulatory Visit: Payer: Self-pay | Admitting: Internal Medicine

## 2017-07-24 ENCOUNTER — Telehealth: Payer: Self-pay | Admitting: Internal Medicine

## 2017-07-24 NOTE — Telephone Encounter (Signed)
Pat received a call from [facility].  The request for authorization to treat patient Chelsea Lester for pain management.  After speaking with the Dr. Amil Amen, authorization was not given.  Pat notified the caller, who stated they would over ride the authorization.

## 2017-09-04 ENCOUNTER — Other Ambulatory Visit: Payer: Self-pay | Admitting: Family Medicine

## 2017-09-04 DIAGNOSIS — Z1231 Encounter for screening mammogram for malignant neoplasm of breast: Secondary | ICD-10-CM

## 2017-09-16 ENCOUNTER — Encounter: Payer: Self-pay | Admitting: Physical Therapy

## 2017-09-16 ENCOUNTER — Ambulatory Visit: Payer: Medicaid Other | Attending: Family Medicine | Admitting: Physical Therapy

## 2017-09-16 DIAGNOSIS — M25561 Pain in right knee: Secondary | ICD-10-CM | POA: Diagnosis present

## 2017-09-16 DIAGNOSIS — R262 Difficulty in walking, not elsewhere classified: Secondary | ICD-10-CM | POA: Diagnosis present

## 2017-09-16 DIAGNOSIS — G8929 Other chronic pain: Secondary | ICD-10-CM | POA: Insufficient documentation

## 2017-09-16 DIAGNOSIS — M545 Low back pain: Secondary | ICD-10-CM | POA: Diagnosis not present

## 2017-09-16 DIAGNOSIS — M25562 Pain in left knee: Secondary | ICD-10-CM | POA: Insufficient documentation

## 2017-09-16 NOTE — Therapy (Signed)
[place], [place], [address] Phone: [phone]   Fax:  [phone]  Physical Therapy Evaluation  Patient Details  Name: Chelsea Lester MRN: [id number] Date of Birth: 1956-10-28 Referring Provider: Elenore Paddy, FNP   Encounter Date: 09/16/2017  PT End of Session - 09/16/17 1012    Visit Number  1    Number of Visits  4    Date for PT Re-Evaluation  10/18/17    Authorization Type  MCD- 3 visits in first auth, ERO at 2nd treat    PT Start Time  1012    PT Stop Time  1055    PT Time Calculation (min)  43 min    Equipment Utilized During Treatment  Gait belt    Activity Tolerance  Patient tolerated treatment well    Behavior During Therapy  [facility] for tasks assessed/performed       Past Medical History:  Diagnosis Date  . Anginal pain ([place])    LAST CP TODAY  . Anxiety   . Asthma   . Blood dyscrasia   . Cataract   . Chronic bronchitis ([place])    "get it q year"  . Chronic lower back pain   . Colon polyp   . Complication of anesthesia    "couldn't get me woke when they put m3e to sleep for my teeth"  . Dental caries    periodontitis, lesion left mandible  . Depression 11/07   Hospitalization required  . Diverticulosis   . Dyspnea    W/ EXERTION      . Esophageal stricture   . Fibromyalgia   . GERD (gastroesophageal reflux disease)   . Gout   . Headache    "weekly" (08/26/2015)     LITTLE BETTER  09/18/16  . Hemorrhoids   . History of hiatal hernia   . Hyperlipidemia   . Hypertension   . IBS (irritable bowel syndrome)   . Lupus   . Microcytosis   . Myocardial infarction ([place])    "slight one years ago"  Not clear where this information comes from--has had multiple stress tests and caths showing no CAD  . Obesity   . OSA on CPAP    CPAP AT NIGHT W/ O2 PRN  . Osteoarthritis    "all over"  . Pneumonia "several times"  . PONV (postoperative nausea and vomiting)   . Sickle-cell  trait ([place])   . Sleep disorder   . Spinal headache   . Type II diabetes mellitus ([place])     Past Surgical History:  Procedure Laterality Date  . ABDOMINAL HYSTERECTOMY    . COLONOSCOPY    . DILATION AND CURETTAGE OF UTERUS    . ESOPHAGOGASTRODUODENOSCOPY (EGD) WITH ESOPHAGEAL DILATION  "all the time"  . EYE SURGERY Bilateral    "had surgery on the sides of my eyes to keep them popping out"  . MULTIPLE TOOTH EXTRACTIONS  ?1980's   "& cut into my jaw"  . TONSILLECTOMY    . TUBAL LIGATION    . UPPER GASTROINTESTINAL ENDOSCOPY      There were no vitals filed for this visit.   Subjective Assessment - 09/16/17 1015    Subjective  Back pain began years ago of insidious onset. sometimes it is hard to move around. bilateral knee pain as well. Sometimes has to use walker at home, has a cane in the car but does not want to use it unil she has to. Fell 2 days  ago- it might have been my blood pressure/sugar. Husband had to help her up. Pt denies regular exercise.     How long can you sit comfortably?  30 min    How long can you walk comfortably?  good day- 10 min, bad day- unable    Patient Stated Goals  decrease pain, walk & stand better, be able to walk to light at [facility] from clinic    Currently in Pain?  Yes    Pain Score  7     Pain Location  Back    Pain Orientation  Lower;Mid    Pain Descriptors / Indicators  Pressure    Pain Onset  More than a month ago    Pain Frequency  Intermittent    Aggravating Factors   walking (sometimes), standing, being in a rush    Pain Relieving Factors  sit down         [facility] PT Assessment - 09/16/17 0001      Assessment   Medical Diagnosis  Degeneration, lumbar/umbosacral disc    Referring Provider  Elenore Paddy, FNP    Onset Date/Surgical Date  -- years ago    Hand Dominance  Right    Prior Therapy  -- a long time ago, was helpful      Precautions   Precautions  Fall      Restrictions   Weight Bearing Restrictions  No       Balance Screen   Has the patient fallen in the past 6 months  Yes    How many times?  3    Has the patient had a decrease in activity level because of a fear of falling?   Yes    Is the patient reluctant to leave their home because of a fear of falling?   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      Cognition   Overall Cognitive Status  Within Functional Limits for tasks assessed      Observation/Other Assessments   Focus on Therapeutic Outcomes (FOTO)   -- MCD      Sensation   Additional Comments  reports occasional bilat N/T, was more in R, now more in L      Posture/Postural Control   Posture Comments  anterior pelvic tilt, lacking knee extension bilat      ROM / Strength   AROM / PROM / Strength  Strength      Strength   Strength Assessment Site  Hip;Knee    Right/Left Hip  Right;Left    Right Hip Flexion  4+/5    Right Hip ABduction  4/5    Left Hip Flexion  4-/5    Left Hip ABduction  4/5    Right/Left Knee  Right;Left    Right Knee Flexion  5/5    Right Knee Extension  5/5    Left Knee Flexion  5/5    Left Knee Extension  5/5      Ambulation/Gait   Gait Comments  104.6 ft before seated rest break             Objective measurements completed on examination: See above findings.  [facility] Adult PT Treatment/Exercise - 09/16/17 0001      Exercises   Exercises  Lumbar      Lumbar Exercises: Stretches   Passive Hamstring Stretch Limitations  seated in chair    Single Knee to Chest Stretch  4 reps    Lower Trunk Rotation  4 reps      Lumbar Exercises: Supine   Bridge Limitations  pillow bw knees    Straight Leg Raises Limitations  x10 neutral, x10 ER             PT Education - 09/16/17  1201    Education provided  Yes    Education Details  anatomy of condition, POC, HEP, exercise form/rationale    Person(s) Educated  Patient    Methods  Explanation;Demonstration;Tactile cues;Verbal cues;Handout    Comprehension  Verbalized understanding;Need further instruction;Returned demonstration;Verbal cues required;Tactile cues required       PT Short Term Goals - 09/16/17 1205      PT SHORT TERM GOAL #1   Title  Pt will verbalize independence in HEP for management of pain throughout the day    Baseline  began establishing at eval    Time  5    Period  Weeks    Status  New    Target Date  10/18/17      PT SHORT TERM GOAL #2   Title  Pt to verbalize decreased pain to average 6/10 with daily activities    Baseline  7/10 at eval    Time  5    Period  Weeks    Status  New    Target Date  10/18/17      PT SHORT TERM GOAL #3   Title  Pt will walk for at least 150 ft without rest break to demo improving endurance    Baseline  104 ft at eval    Time  5    Period  Weeks    Status  New    Target Date  10/18/17        PT Long Term Goals - 09/16/17 1209      PT LONG TERM GOAL #1   Title  Pt will demo gross hip strength 5/5 for proper support to lumbopelvic & LE biomechanical chain    Baseline  see flowsheet    Time  11    Period  Weeks    Status  New    Target Date  12/06/17      PT LONG TERM GOAL #2   Title  Pt will ambulate without rest break for at least 300 ft    Baseline  104 at eval    Time  11    Period  Weeks    Status  New    Target Date  12/06/17      PT LONG TERM GOAL #3   Title  Pt will verbalize 50% improvement in ability to stand throughout her day    Baseline  severely limited at eval, unable to stand comfortably on a "bad day"    Time  11    Period  Weeks    Status  New    Target Date  12/06/17      PT LONG TERM GOAL #4   Title  Pt will be independent in final HEP for long term care at eval    Baseline  will progress as appropriate    Time   11    Period  Weeks  Status  New    Target Date  12/06/17             Plan - 09/16/17 1055    Clinical Impression Statement  Pt presents to PT with c/o LBP and bilateral knee pain of insidious onset that began years ago. Pt is significantly limited in ambulation endurance and flexibility. Standing posture presents with lacking knee and hip extension bilaterally creating poor biomechanical chain effects on low back. pt will benefit from skilled PT in order to improve gross strength and flexibility to increase ambulation endurance. discussed pt looking in to gyms that she would be able to join after d/c in order to continue exercise.     History and Personal Factors relevant to plan of care:  see history    Clinical Presentation  Stable    Clinical Decision Making  Low    Rehab Potential  Good    PT Frequency  -- 1/week 3 weeks, followed by 2/week 6 weeks    PT Treatment/Interventions  ADLs/Self Care Home Management;Cryotherapy;Electrical Stimulation;Ultrasound;Traction;Moist Heat;Iontophoresis 4mg /ml Dexamethasone;Gait training;Stair training;Functional mobility training;Therapeutic activities;Therapeutic exercise;Balance training;Patient/family education;Neuromuscular re-education;Manual techniques;Passive range of motion;Taping;Dry needling    PT Next Visit Plan  nu step, core engagement, quad strengthening    PT Home Exercise Plan  SKTC, LTR, bridge with pillow, SLR neutral & ER    Consulted and Agree with Plan of Care  Patient       Patient will benefit from skilled therapeutic intervention in order to improve the following deficits and impairments:  Improper body mechanics, Pain, Postural dysfunction, Increased muscle spasms, Decreased mobility, Decreased activity tolerance, Decreased endurance, Decreased range of motion, Decreased strength, Obesity, Impaired flexibility, Difficulty walking  Visit Diagnosis: Chronic midline low back pain without sciatica - Plan: PT plan of care  cert/re-cert  Chronic pain of right knee - Plan: PT plan of care cert/re-cert  Chronic pain of left knee - Plan: PT plan of care cert/re-cert  Difficulty in walking, not elsewhere classified - Plan: PT plan of care cert/re-cert     Problem List Patient Active Problem List   Diagnosis Date Noted  . Chest pain 06/23/2017  . Left sided numbness 06/23/2017  . Chest pain, precordial 09/18/2016    Class: Acute  . Precordial chest pain 09/18/2016  . Acute encephalopathy   . Hyperglycemia 08/26/2015  . Chronic high back pain 08/26/2015  . AKI (acute kidney injury) ([place]) 08/26/2015  . Prolonged Q-T interval on ECG 08/26/2015  . OSA (obstructive sleep apnea) 08/26/2015  . Chest pain at rest 03/09/2014  . Family history of breast cancer 03/23/2013  . Family history of GI tract cancer 03/23/2013  . Dyspnea 05/02/2012  . Pulmonary infiltrates 05/02/2012  . Cough 05/02/2012  . Family history of malignant neoplasm of gastrointestinal tract 07/25/2011  . Abdominal pain, epigastric 07/25/2011  . UNSPECIFIED IRON DEFICIENCY ANEMIA 03/23/2009  . Arthritis, degenerative 01/05/2009  . HEMORRHOIDS, INTERNAL 10/05/2008  . RHINITIS 01/14/2008  . HYPOTENSION 11/14/2007  . CELLULITIS AND ABSCESS OF TRUNK 11/14/2007  . HYPOKALEMIA 10/28/2007  . UTI 10/28/2007  . DIZZINESS 10/28/2007  . Irritable bowel syndrome 08/13/2007  . Exophthalmos 05/26/2007  . BILIRUBINURIA 05/26/2007  . DRUG ABUSE 04/14/2007  . SINUSITIS, MAXILLARY, ACUTE 03/24/2007  . ANXIETY DISORDER, GENERALIZED 03/04/2007  . CALF PAIN 03/04/2007  . BENIGN NEOPLASM [place] DIGESTIVE SYSTEM 01/17/2007  . ESOPHAGEAL STRICTURE 01/17/2007  . Hyperlipidemia 01/14/2007  . INSOMNIA 12/03/2006  . POLYP, COLON 09/12/2006  . Uncontrolled diabetes mellitus ([place]) 09/12/2006  .  Obesity, Class III, BMI 40-49.9 (morbid obesity) ([place]) 09/12/2006  . Asthma 09/12/2006  . GERD 09/12/2006  . OSTEOARTHRITIS 09/12/2006  . MICROCYTOSIS  09/12/2006  . HYSTERECTOMY, HX OF 09/12/2006  . Essential (primary) hypertension 09/12/2006  . Type 2 diabetes mellitus ([place]) 09/12/2006   Kalisi Bevill C. Rashawna Scoles PT, DPT 09/16/17 12:17 PM   [facility] [address] [place], [place], [address] Phone: [phone]   Fax:  [phone]  Name: Chelsea Lester MRN: [id number] Date of Birth: 1956/06/13

## 2017-09-25 ENCOUNTER — Ambulatory Visit: Payer: Medicaid Other | Admitting: Internal Medicine

## 2017-09-30 ENCOUNTER — Ambulatory Visit: Payer: Medicaid Other | Admitting: Physical Therapy

## 2017-10-07 ENCOUNTER — Encounter: Payer: Self-pay | Admitting: Physical Therapy

## 2017-10-07 ENCOUNTER — Ambulatory Visit: Payer: Medicaid Other | Attending: Family Medicine | Admitting: Physical Therapy

## 2017-10-07 DIAGNOSIS — R262 Difficulty in walking, not elsewhere classified: Secondary | ICD-10-CM | POA: Insufficient documentation

## 2017-10-07 DIAGNOSIS — M25562 Pain in left knee: Secondary | ICD-10-CM | POA: Diagnosis present

## 2017-10-07 DIAGNOSIS — G8929 Other chronic pain: Secondary | ICD-10-CM | POA: Diagnosis present

## 2017-10-07 DIAGNOSIS — M545 Low back pain, unspecified: Secondary | ICD-10-CM

## 2017-10-07 DIAGNOSIS — M25561 Pain in right knee: Secondary | ICD-10-CM | POA: Diagnosis present

## 2017-10-07 NOTE — Therapy (Addendum)
[place], [place], [address] Phone: [phone]   Fax:  [phone]  Physical Therapy Treatment/Discharge Summary  Patient Details  Name: SUDIKSHA VICTOR MRN: [id number] Date of Birth: August 24, 1956 Referring Provider: Elenore Paddy, FNP   Encounter Date: 10/07/2017  PT End of Session - 10/07/17 0936    Visit Number  2    Number of Visits  4    Date for PT Re-Evaluation  10/18/17    Authorization Type  MCD- 3 visits in first [place], ERO at 2nd treat    PT Start Time  0936 pt arrived late    PT Stop Time  1012    PT Time Calculation (min)  36 min    Activity Tolerance  Patient tolerated treatment well    Behavior During Therapy  [facility] for tasks assessed/performed       Past Medical History:  Diagnosis Date  . Anginal pain ([place])    LAST CP TODAY  . Anxiety   . Asthma   . Blood dyscrasia   . Cataract   . Chronic bronchitis ([place])    "get it q year"  . Chronic lower back pain   . Colon polyp   . Complication of anesthesia    "couldn't get me woke when they put m3e to sleep for my teeth"  . Dental caries    periodontitis, lesion left mandible  . Depression 11/07   Hospitalization required  . Diverticulosis   . Dyspnea    W/ EXERTION      . Esophageal stricture   . Fibromyalgia   . GERD (gastroesophageal reflux disease)   . Gout   . Headache    "weekly" (08/26/2015)     LITTLE BETTER  09/18/16  . Hemorrhoids   . History of hiatal hernia   . Hyperlipidemia   . Hypertension   . IBS (irritable bowel syndrome)   . Lupus   . Microcytosis   . Myocardial infarction ([place])    "slight one years ago"  Not clear where this information comes from--has had multiple stress tests and caths showing no CAD  . Obesity   . OSA on CPAP    CPAP AT NIGHT W/ O2 PRN  . Osteoarthritis    "all over"  . Pneumonia "several times"  . PONV (postoperative nausea and vomiting)   . Sickle-cell trait ([place])   . Sleep  disorder   . Spinal headache   . Type II diabetes mellitus ([place])     Past Surgical History:  Procedure Laterality Date  . ABDOMINAL HYSTERECTOMY    . CARDIAC CATHETERIZATION N/A 09/20/2016   Procedure: Left Heart Cath and Coronary Angiography;  Surgeon: Dixie Dials, MD;  Location: [facility];  Service: Cardiovascular;  Laterality: N/A; Normal coronaries  . COLONOSCOPY    . DILATION AND CURETTAGE OF UTERUS    . ESOPHAGOGASTRODUODENOSCOPY (EGD) WITH ESOPHAGEAL DILATION  "all the time"  . EYE SURGERY Bilateral    "had surgery on the sides of my eyes to keep them popping out"  . MULTIPLE EXTRACTIONS WITH ALVEOLOPLASTY N/A 10/19/2016   Procedure: MULTIPLE EXTRACTION WITH ALVEOLOPLASTY;  Surgeon: Diona Browner, DDS;  Location: [facility];  Service: Oral Surgery;  Laterality: N/A;  . MULTIPLE TOOTH EXTRACTIONS  ?1980's   "& cut into my jaw"  . TONSILLECTOMY    . TUBAL LIGATION    . UPPER GASTROINTESTINAL ENDOSCOPY      There were no vitals filed for this visit.  Subjective Assessment - 10/07/17 0936    Subjective  It hurts. It makes me not want to walk but my sugar has also been up. HEP from eval went well, it was able to decrease some tightness in back.     Patient Stated Goals  decrease pain, walk & stand better, be able to walk to light at [facility] from clinic    Currently in Pain?  Yes    Pain Score  6     Pain Location  Back    Pain Orientation  Lower    Pain Descriptors / Indicators  Aching                      [facility] Adult PT Treatment/Exercise - 10/07/17 0001      Lumbar Exercises: Stretches   Passive Hamstring Stretch Limitations  seated EOB      Lumbar Exercises: Aerobic   Stationary Bike  nu step 5 min L6      Lumbar Exercises: Standing   Wall Slides  20 reps    Other Standing Lumbar Exercises  UE flx pulling on red tband      Lumbar Exercises: Seated   Long Arc Quad on Chair  Both;10 reps 3s holds with slow lower    Other Seated Lumbar  Exercises  iso core press physioball    Other Seated Lumbar Exercises  row blue tband      Lumbar Exercises: Supine   Dead Bug  10 reps both    Other Supine Lumbar Exercises  leg lengthener               PT Short Term Goals - 09/16/17 1205      PT SHORT TERM GOAL #1   Title  Pt will verbalize independence in HEP for management of pain throughout the day    Baseline  began establishing at eval    Time  5    Period  Weeks    Status  New    Target Date  10/18/17      PT SHORT TERM GOAL #2   Title  Pt to verbalize decreased pain to average 6/10 with daily activities    Baseline  7/10 at eval    Time  5    Period  Weeks    Status  New    Target Date  10/18/17      PT SHORT TERM GOAL #3   Title  Pt will walk for at least 150 ft without rest break to demo improving endurance    Baseline  104 ft at eval    Time  5    Period  Weeks    Status  New    Target Date  10/18/17        PT Long Term Goals - 09/16/17 1209      PT LONG TERM GOAL #1   Title  Pt will demo gross hip strength 5/5 for proper support to lumbopelvic & LE biomechanical chain    Baseline  see flowsheet    Time  11    Period  Weeks    Status  New    Target Date  12/06/17      PT LONG TERM GOAL #2   Title  Pt will ambulate without rest break for at least 300 ft    Baseline  104 at eval    Time  11    Period  Weeks    Status  New  Target Date  12/06/17      PT LONG TERM GOAL #3   Title  Pt will verbalize 50% improvement in ability to stand throughout her day    Baseline  severely limited at eval, unable to stand comfortably on a "bad day"    Time  11    Period  Weeks    Status  New    Target Date  12/06/17      PT LONG TERM GOAL #4   Title  Pt will be independent in final HEP for long term care at eval    Baseline  will progress as appropriate    Time  11    Period  Weeks    Status  New    Target Date  12/06/17            Plan - 10/07/17 1013    Clinical Impression Statement   Good tolerance to exercise and reported improvements in back and knee pain. Encouraged pt to join [employer]. Will perform ERO for MCD extension at next visit.     PT Treatment/Interventions  ADLs/Self Care Home Management;Cryotherapy;Electrical Stimulation;Ultrasound;Traction;Moist Heat;Iontophoresis 20m/ml Dexamethasone;Gait training;Stair training;Functional mobility training;Therapeutic activities;Therapeutic exercise;Balance training;Patient/family education;Neuromuscular re-education;Manual techniques;Passive range of motion;Taping;Dry needling    PT Next Visit Plan  nu step, core engagement, quad strengthening- ERO & ask for auth    PT Home Exercise Plan  SKTC, LTR, bridge with pillow, SLR neutral & ER, seated HSS, LAQ, seated pelvic tilt, wall slides, leg lengthener, dead bug, standing shoulder flexion with band    Consulted and Agree with Plan of Care  Patient       Patient will benefit from skilled therapeutic intervention in order to improve the following deficits and impairments:  Improper body mechanics, Pain, Postural dysfunction, Increased muscle spasms, Decreased mobility, Decreased activity tolerance, Decreased endurance, Decreased range of motion, Decreased strength, Obesity, Impaired flexibility, Difficulty walking  Visit Diagnosis: Chronic midline low back pain without sciatica  Chronic pain of right knee  Chronic pain of left knee  Difficulty in walking, not elsewhere classified     Problem List Patient Active Problem List   Diagnosis Date Noted  . Chest pain 06/23/2017  . Left sided numbness 06/23/2017  . Chest pain, precordial 09/18/2016    Class: Acute  . Precordial chest pain 09/18/2016  . Acute encephalopathy   . Hyperglycemia 08/26/2015  . Chronic high back pain 08/26/2015  . AKI (acute kidney injury) ([place] 08/26/2015  . Prolonged Q-T interval on ECG 08/26/2015  . OSA (obstructive sleep apnea) 08/26/2015  . Chest pain at rest 03/09/2014  . Family history of  breast cancer 03/23/2013  . Family history of GI tract cancer 03/23/2013  . Dyspnea 05/02/2012  . Pulmonary infiltrates 05/02/2012  . Cough 05/02/2012  . Family history of malignant neoplasm of gastrointestinal tract 07/25/2011  . Abdominal pain, epigastric 07/25/2011  . UNSPECIFIED IRON DEFICIENCY ANEMIA 03/23/2009  . Arthritis, degenerative 01/05/2009  . HEMORRHOIDS, INTERNAL 10/05/2008  . RHINITIS 01/14/2008  . HYPOTENSION 11/14/2007  . CELLULITIS AND ABSCESS OF TRUNK 11/14/2007  . HYPOKALEMIA 10/28/2007  . UTI 10/28/2007  . DIZZINESS 10/28/2007  . Irritable bowel syndrome 08/13/2007  . Exophthalmos 05/26/2007  . BILIRUBINURIA 05/26/2007  . DRUG ABUSE 04/14/2007  . SINUSITIS, MAXILLARY, ACUTE 03/24/2007  . ANXIETY DISORDER, GENERALIZED 03/04/2007  . CALF PAIN 03/04/2007  . BENIGN NEOPLASM [place] 01/17/2007  . ESOPHAGEAL STRICTURE 01/17/2007  . Hyperlipidemia 01/14/2007  . INSOMNIA 12/03/2006  . POLYP, COLON 09/12/2006  .  Uncontrolled diabetes mellitus ([place]) 09/12/2006  . Obesity, Class III, BMI 40-49.9 (morbid obesity) ([place]) 09/12/2006  . Asthma 09/12/2006  . GERD 09/12/2006  . OSTEOARTHRITIS 09/12/2006  . MICROCYTOSIS 09/12/2006  . HYSTERECTOMY, HX OF 09/12/2006  . Essential (primary) hypertension 09/12/2006  . Type 2 diabetes mellitus ([place]) 09/12/2006   Jemal Miskell C. Domanik [place] PT, DPT 10/07/17 12:54 PM   [facility] [address] [place], [place], [address] Phone: [phone]   Fax:  [phone]  Name: JAYLYNNE BIRKHEAD MRN: [id number] Date of Birth: [birth year]  PHYSICAL THERAPY DISCHARGE SUMMARY  Visits from Start of Care: 2  Current functional level related to goals / functional outcomes: See above   Remaining deficits: See above   Education / Equipment: Anatomy of condition, POC, HEP, exercise form/rationale  Plan: Patient agrees to discharge.  Patient goals were not met.  Patient is being discharged due to not returning since the last visit.  ?????    Pt did not return since last visit and is now outside of [employer] authorization.   Sabre Leonetti C. Chance Munter PT, DPT 11/14/17 9:02 AM

## 2017-10-08 ENCOUNTER — Ambulatory Visit: Payer: Medicaid Other | Admitting: Sports Medicine

## 2017-10-09 ENCOUNTER — Ambulatory Visit: Payer: Self-pay

## 2017-10-14 ENCOUNTER — Ambulatory Visit: Payer: Medicaid Other | Admitting: Physical Therapy

## 2017-11-25 ENCOUNTER — Other Ambulatory Visit: Payer: Self-pay | Admitting: Internal Medicine

## 2017-11-27 ENCOUNTER — Other Ambulatory Visit: Payer: Self-pay | Admitting: Internal Medicine

## 2018-05-14 ENCOUNTER — Other Ambulatory Visit: Payer: Self-pay

## 2018-05-14 ENCOUNTER — Encounter (HOSPITAL_COMMUNITY): Payer: Self-pay | Admitting: General Practice

## 2018-05-14 ENCOUNTER — Observation Stay (HOSPITAL_COMMUNITY)
Admission: AD | Admit: 2018-05-14 | Discharge: 2018-05-17 | Disposition: A | Discharge status: Home / Self Care | Payer: Medicaid Other | Source: Ambulatory Visit | Attending: Cardiovascular Disease | Admitting: Cardiovascular Disease

## 2018-05-14 DIAGNOSIS — Z8249 Family history of ischemic heart disease and other diseases of the circulatory system: Secondary | ICD-10-CM | POA: Insufficient documentation

## 2018-05-14 DIAGNOSIS — K589 Irritable bowel syndrome without diarrhea: Secondary | ICD-10-CM | POA: Insufficient documentation

## 2018-05-14 DIAGNOSIS — D509 Iron deficiency anemia, unspecified: Secondary | ICD-10-CM | POA: Insufficient documentation

## 2018-05-14 DIAGNOSIS — D571 Sickle-cell disease without crisis: Secondary | ICD-10-CM | POA: Diagnosis not present

## 2018-05-14 DIAGNOSIS — Z888 Allergy status to other drugs, medicaments and biological substances status: Secondary | ICD-10-CM | POA: Insufficient documentation

## 2018-05-14 DIAGNOSIS — M797 Fibromyalgia: Secondary | ICD-10-CM | POA: Diagnosis not present

## 2018-05-14 DIAGNOSIS — I110 Hypertensive heart disease with heart failure: Secondary | ICD-10-CM | POA: Diagnosis not present

## 2018-05-14 DIAGNOSIS — I509 Heart failure, unspecified: Secondary | ICD-10-CM | POA: Insufficient documentation

## 2018-05-14 DIAGNOSIS — Z7982 Long term (current) use of aspirin: Secondary | ICD-10-CM | POA: Diagnosis not present

## 2018-05-14 DIAGNOSIS — G4733 Obstructive sleep apnea (adult) (pediatric): Secondary | ICD-10-CM | POA: Insufficient documentation

## 2018-05-14 DIAGNOSIS — Z833 Family history of diabetes mellitus: Secondary | ICD-10-CM | POA: Insufficient documentation

## 2018-05-14 DIAGNOSIS — M329 Systemic lupus erythematosus, unspecified: Secondary | ICD-10-CM | POA: Insufficient documentation

## 2018-05-14 DIAGNOSIS — Z794 Long term (current) use of insulin: Secondary | ICD-10-CM | POA: Insufficient documentation

## 2018-05-14 DIAGNOSIS — E66813 Obesity, class 3: Secondary | ICD-10-CM | POA: Diagnosis present

## 2018-05-14 DIAGNOSIS — Z6841 Body Mass Index (BMI) 40.0 and over, adult: Secondary | ICD-10-CM | POA: Insufficient documentation

## 2018-05-14 DIAGNOSIS — Z955 Presence of coronary angioplasty implant and graft: Secondary | ICD-10-CM | POA: Insufficient documentation

## 2018-05-14 DIAGNOSIS — F419 Anxiety disorder, unspecified: Secondary | ICD-10-CM | POA: Diagnosis not present

## 2018-05-14 DIAGNOSIS — I252 Old myocardial infarction: Secondary | ICD-10-CM | POA: Diagnosis not present

## 2018-05-14 DIAGNOSIS — Z841 Family history of disorders of kidney and ureter: Secondary | ICD-10-CM | POA: Diagnosis not present

## 2018-05-14 DIAGNOSIS — Z90710 Acquired absence of both cervix and uterus: Secondary | ICD-10-CM | POA: Insufficient documentation

## 2018-05-14 DIAGNOSIS — M109 Gout, unspecified: Secondary | ICD-10-CM | POA: Insufficient documentation

## 2018-05-14 DIAGNOSIS — Z91040 Latex allergy status: Secondary | ICD-10-CM | POA: Insufficient documentation

## 2018-05-14 DIAGNOSIS — IMO0002 Reserved for concepts with insufficient information to code with codable children: Secondary | ICD-10-CM | POA: Diagnosis present

## 2018-05-14 DIAGNOSIS — E1165 Type 2 diabetes mellitus with hyperglycemia: Secondary | ICD-10-CM | POA: Insufficient documentation

## 2018-05-14 DIAGNOSIS — J449 Chronic obstructive pulmonary disease, unspecified: Secondary | ICD-10-CM | POA: Diagnosis not present

## 2018-05-14 DIAGNOSIS — Z7951 Long term (current) use of inhaled steroids: Secondary | ICD-10-CM | POA: Insufficient documentation

## 2018-05-14 DIAGNOSIS — J45909 Unspecified asthma, uncomplicated: Secondary | ICD-10-CM | POA: Diagnosis present

## 2018-05-14 DIAGNOSIS — M1990 Unspecified osteoarthritis, unspecified site: Secondary | ICD-10-CM | POA: Insufficient documentation

## 2018-05-14 DIAGNOSIS — Z79899 Other long term (current) drug therapy: Secondary | ICD-10-CM | POA: Insufficient documentation

## 2018-05-14 DIAGNOSIS — R0789 Other chest pain: Secondary | ICD-10-CM | POA: Diagnosis present

## 2018-05-14 DIAGNOSIS — E039 Hypothyroidism, unspecified: Secondary | ICD-10-CM | POA: Diagnosis not present

## 2018-05-14 DIAGNOSIS — Z9889 Other specified postprocedural states: Secondary | ICD-10-CM | POA: Insufficient documentation

## 2018-05-14 DIAGNOSIS — K219 Gastro-esophageal reflux disease without esophagitis: Secondary | ICD-10-CM | POA: Insufficient documentation

## 2018-05-14 DIAGNOSIS — H0520 Unspecified exophthalmos: Secondary | ICD-10-CM | POA: Diagnosis present

## 2018-05-14 DIAGNOSIS — R072 Precordial pain: Secondary | ICD-10-CM | POA: Diagnosis present

## 2018-05-14 DIAGNOSIS — F329 Major depressive disorder, single episode, unspecified: Secondary | ICD-10-CM | POA: Diagnosis not present

## 2018-05-14 DIAGNOSIS — E785 Hyperlipidemia, unspecified: Secondary | ICD-10-CM | POA: Diagnosis present

## 2018-05-14 DIAGNOSIS — Z885 Allergy status to narcotic agent status: Secondary | ICD-10-CM | POA: Insufficient documentation

## 2018-05-14 DIAGNOSIS — I249 Acute ischemic heart disease, unspecified: Secondary | ICD-10-CM | POA: Diagnosis present

## 2018-05-14 HISTORY — DX: Anemia, unspecified: D64.9

## 2018-05-14 HISTORY — DX: Bipolar disorder, unspecified: F31.9

## 2018-05-14 HISTORY — DX: Acute embolism and thrombosis of unspecified deep veins of unspecified lower extremity: I82.409

## 2018-05-14 LAB — COMPREHENSIVE METABOLIC PANEL
ALT: 16 U/L (ref 0–44)
AST: 14 U/L — ABNORMAL LOW (ref 15–41)
Albumin: 3.4 g/dL — ABNORMAL LOW (ref 3.5–5.0)
Alkaline Phosphatase: 46 U/L (ref 38–126)
Anion gap: 9 (ref 5–15)
BILIRUBIN TOTAL: 0.4 mg/dL (ref 0.3–1.2)
BUN: 16 mg/dL (ref 8–23)
CALCIUM: 9 mg/dL (ref 8.9–10.3)
CHLORIDE: 101 mmol/L (ref 98–111)
CO2: 29 mmol/L (ref 22–32)
Creatinine, Ser: 1.17 mg/dL — ABNORMAL HIGH (ref 0.44–1.00)
GFR, EST AFRICAN AMERICAN: 57 mL/min — AB (ref >60)
GFR, EST NON AFRICAN AMERICAN: 49 mL/min — AB (ref >60)
Glucose, Bld: 69 mg/dL — ABNORMAL LOW (ref 70–99)
Potassium: 3.7 mmol/L (ref 3.5–5.1)
Sodium: 139 mmol/L (ref 135–145)
TOTAL PROTEIN: 6.7 g/dL (ref 6.5–8.1)

## 2018-05-14 LAB — GLUCOSE, CAPILLARY
GLUCOSE-CAPILLARY: 133 mg/dL — AB (ref 70–99)
Glucose-Capillary: 143 mg/dL — ABNORMAL HIGH (ref 70–99)
Glucose-Capillary: 264 mg/dL — ABNORMAL HIGH (ref 70–99)

## 2018-05-14 LAB — PROTIME-INR
INR: 1.03
Prothrombin Time: 13.4 seconds (ref 11.4–15.2)

## 2018-05-14 LAB — APTT: aPTT: 26 seconds (ref 24–36)

## 2018-05-14 LAB — TROPONIN I

## 2018-05-14 LAB — HEPARIN LEVEL (UNFRACTIONATED): Heparin Unfractionated: 0.14 IU/mL — ABNORMAL LOW (ref 0.30–0.70)

## 2018-05-14 LAB — MRSA PCR SCREENING: MRSA by PCR: NEGATIVE

## 2018-05-14 LAB — HEMOGLOBIN A1C
Hgb A1c MFr Bld: 12.7 % — ABNORMAL HIGH (ref 4.8–5.6)
Mean Plasma Glucose: 317.79 mg/dL

## 2018-05-14 MED ORDER — NITROGLYCERIN 0.4 MG SL SUBL: 0.4000 mg | SUBLINGUAL_TABLET | SUBLINGUAL | Status: DC | PRN

## 2018-05-14 MED ORDER — ASPIRIN 81 MG PO CHEW
324.0000 mg | CHEWABLE_TABLET | ORAL | Status: AC
  Administered 2018-05-14: 324 mg via ORAL
  Filled 2018-05-14: qty 4

## 2018-05-14 MED ORDER — POTASSIUM CHLORIDE CRYS ER 10 MEQ PO TBCR
10.0000 meq | EXTENDED_RELEASE_TABLET | Freq: Two times a day (BID) | ORAL | Status: DC
  Administered 2018-05-14 – 2018-05-16 (×5): 10 meq via ORAL
  Filled 2018-05-14 (×7): qty 1

## 2018-05-14 MED ORDER — LORATADINE 10 MG PO TABS
10.0000 mg | ORAL_TABLET | ORAL | Status: DC
  Administered 2018-05-16 – 2018-05-17 (×2): 10 mg via ORAL
  Filled 2018-05-14 (×3): qty 1

## 2018-05-14 MED ORDER — IPRATROPIUM BROMIDE 0.02 % IN SOLN
0.5000 mg | Freq: Three times a day (TID) | RESPIRATORY_TRACT | Status: DC
  Administered 2018-05-14 – 2018-05-16 (×4): 0.5 mg via RESPIRATORY_TRACT
  Filled 2018-05-14 (×5): qty 2.5

## 2018-05-14 MED ORDER — ASPIRIN EC 81 MG PO TBEC
81.0000 mg | DELAYED_RELEASE_TABLET | Freq: Every day | ORAL | Status: DC
  Administered 2018-05-15 – 2018-05-16 (×2): 81 mg via ORAL
  Filled 2018-05-14 (×2): qty 1

## 2018-05-14 MED ORDER — BACLOFEN 20 MG PO TABS
20.0000 mg | ORAL_TABLET | Freq: Two times a day (BID) | ORAL | Status: DC
  Administered 2018-05-15 – 2018-05-16 (×5): 20 mg via ORAL
  Filled 2018-05-14 (×5): qty 1

## 2018-05-14 MED ORDER — BRIMONIDINE TARTRATE 0.2 % OP SOLN
1.0000 [drp] | Freq: Two times a day (BID) | OPHTHALMIC | Status: DC
  Administered 2018-05-14 – 2018-05-17 (×6): 1 [drp] via OPHTHALMIC
  Filled 2018-05-14: qty 5

## 2018-05-14 MED ORDER — DOCUSATE SODIUM 100 MG PO CAPS
100.0000 mg | ORAL_CAPSULE | Freq: Every day | ORAL | Status: DC
  Administered 2018-05-16: 100 mg via ORAL
  Filled 2018-05-14 (×2): qty 1

## 2018-05-14 MED ORDER — CYCLOSPORINE 0.05 % OP EMUL
1.0000 [drp] | Freq: Two times a day (BID) | OPHTHALMIC | Status: DC
  Administered 2018-05-14 – 2018-05-16 (×4): 1 [drp] via OPHTHALMIC
  Filled 2018-05-14 (×6): qty 1

## 2018-05-14 MED ORDER — TIMOLOL MALEATE 0.5 % OP SOLN
1.0000 [drp] | Freq: Two times a day (BID) | OPHTHALMIC | Status: DC
Start: 2018-05-14 — End: 2018-05-17
  Administered 2018-05-15 – 2018-05-17 (×6): 1 [drp] via OPHTHALMIC
  Filled 2018-05-14: qty 5

## 2018-05-14 MED ORDER — HYDROCODONE-ACETAMINOPHEN 5-325 MG PO TABS
1.0000 | ORAL_TABLET | Freq: Four times a day (QID) | ORAL | Status: DC | PRN
  Administered 2018-05-14 – 2018-05-16 (×4): 1 via ORAL
  Filled 2018-05-14 (×4): qty 1

## 2018-05-14 MED ORDER — LATANOPROST 0.005 % OP SOLN
1.0000 [drp] | Freq: Every day | OPHTHALMIC | Status: DC
  Administered 2018-05-14 – 2018-05-16 (×3): 1 [drp] via OPHTHALMIC
  Filled 2018-05-14: qty 2.5

## 2018-05-14 MED ORDER — ASPIRIN 300 MG RE SUPP
300.0000 mg | RECTAL | Status: AC
  Filled 2018-05-14: qty 1

## 2018-05-14 MED ORDER — MAGNESIUM OXIDE 400 (241.3 MG) MG PO TABS
400.0000 mg | ORAL_TABLET | Freq: Every day | ORAL | Status: DC
  Administered 2018-05-15 – 2018-05-16 (×2): 400 mg via ORAL
  Filled 2018-05-14 (×2): qty 1

## 2018-05-14 MED ORDER — HEPARIN BOLUS VIA INFUSION
2000.0000 [IU] | Freq: Once | INTRAVENOUS | Status: AC
  Administered 2018-05-14: 2000 [IU] via INTRAVENOUS
  Filled 2018-05-14: qty 2000

## 2018-05-14 MED ORDER — METFORMIN HCL 500 MG PO TABS: 500.0000 mg | ORAL_TABLET | Freq: Two times a day (BID) | ORAL | Status: DC

## 2018-05-14 MED ORDER — TRAVOPROST 0.004 % OP SOLN: 1.0000 [drp] | Freq: Every day | OPHTHALMIC | Status: DC

## 2018-05-14 MED ORDER — IPRATROPIUM BROMIDE 0.06 % NA SOLN
2.0000 | Freq: Two times a day (BID) | NASAL | Status: DC
  Administered 2018-05-16 (×2): 2 via NASAL
  Filled 2018-05-14: qty 15
  Filled 2018-05-14 (×2): qty 30
  Filled 2018-05-14: qty 15

## 2018-05-14 MED ORDER — FENOFIBRATE 160 MG PO TABS
160.0000 mg | ORAL_TABLET | Freq: Every day | ORAL | Status: DC
  Administered 2018-05-15 – 2018-05-16 (×2): 160 mg via ORAL
  Filled 2018-05-14 (×2): qty 1

## 2018-05-14 MED ORDER — HEPARIN BOLUS VIA INFUSION
4000.0000 [IU] | Freq: Once | INTRAVENOUS | Status: AC
  Administered 2018-05-14: 4000 [IU] via INTRAVENOUS
  Filled 2018-05-14: qty 4000

## 2018-05-14 MED ORDER — FERROUS SULFATE 325 (65 FE) MG PO TABS: 325.0000 mg | ORAL_TABLET | Freq: Every day | ORAL | Status: DC

## 2018-05-14 MED ORDER — ACETAMINOPHEN 325 MG PO TABS: 650.0000 mg | ORAL_TABLET | ORAL | Status: DC | PRN

## 2018-05-14 MED ORDER — METOCLOPRAMIDE HCL 10 MG PO TABS
10.0000 mg | ORAL_TABLET | Freq: Two times a day (BID) | ORAL | Status: DC
  Administered 2018-05-14 – 2018-05-16 (×5): 10 mg via ORAL
  Filled 2018-05-14 (×5): qty 1

## 2018-05-14 MED ORDER — BRIMONIDINE TARTRATE-TIMOLOL 0.2-0.5 % OP SOLN: 1.0000 [drp] | Freq: Two times a day (BID) | OPHTHALMIC | Status: DC

## 2018-05-14 MED ORDER — ALLOPURINOL 100 MG PO TABS
100.0000 mg | ORAL_TABLET | Freq: Every day | ORAL | Status: DC
  Administered 2018-05-15 – 2018-05-16 (×2): 100 mg via ORAL
  Filled 2018-05-14 (×2): qty 1

## 2018-05-14 MED ORDER — INSULIN ASPART PROT & ASPART (70-30 MIX) 100 UNIT/ML ~~LOC~~ SUSP
40.0000 [IU] | Freq: Two times a day (BID) | SUBCUTANEOUS | Status: DC
  Filled 2018-05-14: qty 10

## 2018-05-14 MED ORDER — POLYETHYLENE GLYCOL 3350 17 G PO PACK
17.0000 g | PACK | Freq: Every day | ORAL | Status: DC
Start: 2018-05-15 — End: 2018-05-17
  Filled 2018-05-14 (×2): qty 1

## 2018-05-14 MED ORDER — HEPARIN (PORCINE) IN NACL 100-0.45 UNIT/ML-% IJ SOLN
1700.0000 [IU]/h | INTRAMUSCULAR | Status: DC
  Administered 2018-05-14 (×2): 1300 [IU]/h via INTRAVENOUS
  Administered 2018-05-15: 1600 [IU]/h via INTRAVENOUS
  Administered 2018-05-15: 1700 [IU]/h via INTRAVENOUS
  Filled 2018-05-14 (×3): qty 250

## 2018-05-14 MED ORDER — AMLODIPINE BESYLATE 5 MG PO TABS
5.0000 mg | ORAL_TABLET | Freq: Every day | ORAL | Status: DC
  Administered 2018-05-15 – 2018-05-16 (×2): 5 mg via ORAL
  Filled 2018-05-14 (×2): qty 1

## 2018-05-14 NOTE — Progress Notes (Signed)
[place] for heparin Indication: chest pain/ACS  Heparin Dosing Weight: 96 kg  Labs: No results for input(s): HGB, HCT, PLT, APTT, LABPROT, INR, HEPARINUNFRC, HEPRLOWMOCWT, CREATININE, CKTOTAL, CKMB, TROPONINI in the last 72 hours.  Assessment: 21 yof presenting with CP. Pharmacy consulted to dose heparin. Not on anticoagulation PTA. CBC pending. No bleed documented.  Goal of Therapy:  Heparin level 0.3-0.7 units/ml Monitor platelets by anticoagulation protocol: Yes   Plan:  Heparin 4000 unit bolus Start heparin at 1300 units/h 6h heparin level Daily heparin level/CBC Monitor s/sx bleeding   Elicia Lamp, PharmD, BCPS Clinical Pharmacist 05/14/2018 1:41 PM

## 2018-05-14 NOTE — Progress Notes (Signed)
[place] for heparin Indication: chest pain/ACS  Heparin Dosing Weight: 96 kg  Labs: Recent Labs    05/14/18 1349 05/14/18 2000  APTT 26  --   LABPROT 13.4  --   INR 1.03  --   HEPARINUNFRC  --  0.14*  CREATININE 1.17*  --   TROPONINI <0.03 <0.03    Assessment: 80 yof presenting with CP. Pharmacy consulted to dose heparin. Not on anticoagulation PTA.  -Initial heparin level= 0.14 on 1300 units/hr  Goal of Therapy:  Heparin level 0.3-0.7 units/ml Monitor platelets by anticoagulation protocol: Yes   Plan:  -Heparin 2000 unit bolus -Increase heparin to 1600 units/hr -Heparin level in 6 hours and daily wth CBC daily  Hildred Laser, PharmD Clinical Pharmacist Please check Amion for pharmacy contact number    05/14/2018 9:39 PM

## 2018-05-14 NOTE — H&P (Signed)
Referring Physician:  [name] is an 62 y.o. female.                       Chief Complaint: Chest pain  HPI: 62 year old female with multiple medical conditions has left precordial chest pain, increased with activity and decreased with rest and SL NTG use. She denies fever, chills or cough. She has PMH of Asthma, CAD, CHF, OSA, Dyslipidemia, hyperthyroidism with Exophthalmus, Obesity and GERD.  Past Medical History:  Diagnosis Date  . Anginal pain ([place])    LAST CP TODAY  . Anxiety   . Asthma   . Blood dyscrasia   . Cataract   . Chronic bronchitis ([place])    "get it q year"  . Chronic lower back pain   . Colon polyp   . Complication of anesthesia    "couldn't get me woke when they put m3e to sleep for my teeth"  . Dental caries    periodontitis, lesion left mandible  . Depression 11/07   Hospitalization required  . Diverticulosis   . Dyspnea    W/ EXERTION      . Esophageal stricture   . Fibromyalgia   . GERD (gastroesophageal reflux disease)   . Gout   . Headache    "weekly" (08/26/2015)     LITTLE BETTER  09/18/16  . Hemorrhoids   . History of hiatal hernia   . Hyperlipidemia   . Hypertension   . IBS (irritable bowel syndrome)   . Lupus   . Microcytosis   . Myocardial infarction ([place])    "slight one years ago"  Not clear where this information comes from--has had multiple stress tests and caths showing no CAD  . Obesity   . OSA on CPAP    CPAP AT NIGHT W/ O2 PRN  . Osteoarthritis    "all over"  . Pneumonia "several times"  . PONV (postoperative nausea and vomiting)   . Sickle-cell trait ([place])   . Sleep disorder   . Spinal headache   . Type II diabetes mellitus ([place])       Past Surgical History:  Procedure Laterality Date  . ABDOMINAL HYSTERECTOMY    . CARDIAC CATHETERIZATION N/A 09/20/2016   Procedure: Left Heart Cath and Coronary Angiography;  Surgeon: Dixie Dials, MD;  Location: [facility];  Service: Cardiovascular;  Laterality: N/A;  Normal coronaries  . COLONOSCOPY    . DILATION AND CURETTAGE OF UTERUS    . ESOPHAGOGASTRODUODENOSCOPY (EGD) WITH ESOPHAGEAL DILATION  "all the time"  . EYE SURGERY Bilateral    "had surgery on the sides of my eyes to keep them popping out"  . MULTIPLE EXTRACTIONS WITH ALVEOLOPLASTY N/A 10/19/2016   Procedure: MULTIPLE EXTRACTION WITH ALVEOLOPLASTY;  Surgeon: Diona Browner, DDS;  Location: [facility];  Service: Oral Surgery;  Laterality: N/A;  . MULTIPLE TOOTH EXTRACTIONS  ?1980's   "& cut into my jaw"  . TONSILLECTOMY    . TUBAL LIGATION    . UPPER GASTROINTESTINAL ENDOSCOPY      Family History  Problem Relation Age of Onset  . Rectal cancer Sister   . Lung cancer Father   . Esophageal cancer Father   . Breast cancer Sister 61  . Breast cancer Mother 56  . Colon cancer Other        Aunt, Uncle  . Liver cancer Brother        Nephew  . Diabetes Sister   . Diabetes Brother   .  Diabetes Other        son & daughter  . Heart disease Sister   . Heart disease Brother   . Kidney disease Sister   . Breast cancer Maternal Aunt        patient reports 11 mat aunts with breast cancer  . Breast cancer Paternal Aunt 85  . Breast cancer Maternal Grandmother        unk age   Social History:  reports that she has never smoked. She has never used smokeless tobacco. She reports that she has current or past drug history. Drug: Hydrocodone. She reports that she does not drink alcohol.  Allergies:  Allergies  Allergen Reactions  . Suboxone [Buprenorphine Hcl-Naloxone Hcl]     Itchy rash--difficult history.  . Ace Inhibitors Cough    UNSPECIFIED REACTION   . Latex Rash  . Percocet [Oxycodone-Acetaminophen] Itching         Medications Prior to Admission  Medication Sig Dispense Refill  . albuterol (PROVENTIL,VENTOLIN) 90 MCG/ACT inhaler Inhale 2 puffs into the lungs every 6 (six) hours as needed for shortness of breath. Wheezing or shortness of breath    . allopurinol (ZYLOPRIM) 100 MG  tablet Take 1 tablet (100 mg total) by mouth daily. 30 tablet 11  . amLODipine (NORVASC) 10 MG tablet Take 1 tablet (10 mg total) by mouth daily. 30 tablet 11  . aspirin 81 MG tablet Take 81 mg by mouth every morning.     . baclofen (LIORESAL) 20 MG tablet Take 20 mg by mouth 2 (two) times daily.  0  . citalopram (CELEXA) 10 MG tablet Take 1 tablet (10 mg total) by mouth daily. 30 tablet 11  . COMBIGAN 0.2-0.5 % ophthalmic solution Place 1 drop into both eyes every 12 (twelve) hours. 15 mL 11  . diclofenac sodium (VOLTAREN) 1 % GEL Apply 2 g topically 4 (four) times daily as needed (pain). 100 g 6  . docusate sodium (COLACE) 100 MG capsule Take 1 capsule (100 mg total) by mouth daily. 30 capsule 11  . ergocalciferol (VITAMIN D2) 50000 units capsule Take 1 capsule (50,000 Units total) by mouth once a week. Usually takes on Saturday or fridays 30 capsule 0  . fenofibrate (TRICOR) 145 MG tablet Take 1 tablet (145 mg total) by mouth daily. 30 tablet 11  . ferrous sulfate 325 (65 FE) MG tablet 1 tab twice daily with half and orange. 180 tablet 11  . fluticasone (FLONASE) 50 MCG/ACT nasal spray 2 sprays each nostril once daily 1 g 11  . fluticasone-salmeterol (ADVAIR HFA) 115-21 MCG/ACT inhaler Inhale 2 puffs into the lungs 2 (two) times daily. 1 Inhaler 12  . insulin NPH-regular Human (NOVOLIN 70/30) (70-30) 100 UNIT/ML injection Inject 60 units BID 10 mL 11  . ipratropium (ATROVENT) 0.02 % nebulizer solution Take 2.5 mLs (0.5 mg total) by nebulization 3 (three) times daily. Nebulize 0.5 mg three times daily 225 mL 11  . ipratropium (ATROVENT) 0.03 % nasal spray Place 2 sprays into both nostrils every 12 (twelve) hours. 30 mL 11  . Lancets MISC Check sugars twice daily before meals 100 each 11  . Latanoprostene Bunod (VYZULTA) 0.024 % SOLN Apply to eye. 1 drop each eye twice a day    . loratadine (CLARITIN) 10 MG tablet Take 1 tablet (10 mg total) by mouth every morning. 30 tablet 11  . metFORMIN  (GLUCOPHAGE) 500 MG tablet take 1 tablet by mouth twice a day with food 60 tablet 0  .  metFORMIN (GLUCOPHAGE) 500 MG tablet take 1 tablet by mouth twice a day with food 60 tablet 0  . metoprolol tartrate (LOPRESSOR) 50 MG tablet take 1 tablet by mouth twice a day 60 tablet 0  . metoprolol tartrate (LOPRESSOR) 50 MG tablet take 1 tablet by mouth twice a day 60 tablet 0  . nitroGLYCERIN (NITROSTAT) 0.4 MG SL tablet Place 0.4 mg under the tongue every 5 (five) minutes as needed for chest pain.   0  . pantoprazole (PROTONIX) 40 MG tablet Take 1 tablet (40 mg total) by mouth daily. 30 mins prior to breakfast. 30 tablet 6  . polyethylene glycol (MIRALAX / GLYCOLAX) packet Take 17 g by mouth daily. (Patient taking differently: Take 17 g by mouth daily as needed for mild constipation. ) 14 each 11  . potassium chloride (K-DUR) 10 MEQ tablet Take 1 tablet (10 mEq total) by mouth daily. 30 tablet 11  . promethazine (PHENERGAN) 25 MG tablet take 1/2 tablet by mouth every 6 hours if needed for nausea and vomiting 30 tablet 0  . RESTASIS 0.05 % ophthalmic emulsion Place 1 drop into both eyes 2 (two) times daily.   0  . simvastatin (ZOCOR) 40 MG tablet Take 1 tablet (40 mg total) by mouth at bedtime. 30 tablet 11  . travoprost, benzalkonium, (TRAVATAN) 0.004 % ophthalmic solution Place 1 drop into both eyes at bedtime. 2.5 mL 12    Results for orders placed or performed during the hospital encounter of 05/14/18 (from the past 48 hour(s))  Glucose, capillary     Status: Abnormal   Collection Time: 05/14/18 12:04 PM  Result Value Ref Range   Glucose-Capillary 133 (H) 70 - 99 mg/dL   Comment 1 Notify RN    Comment 2 Document in Chart    No results found.  Review Of Systems Constitutional: No fever, chills, Chronic weight gain. Eyes: No vision change, wears glasses. No discharge or pain. Ears: No hearing loss, No tinnitus. Respiratory: Positive asthma, COPD, pneumonias. Positive shortness of breath. No  hemoptysis. Cardiovascular: Positive chest pain, palpitation, leg edema. Gastrointestinal: No nausea, vomiting, diarrhea, constipation. No GI bleed. No hepatitis. Genitourinary: No dysuria, hematuria, kidney stone. No incontinance. Neurological: Positive headache, no stroke, no seizures.  Psychiatry: No psych facility admission for anxiety, depression, suicide. No detox. Skin: No rash. Musculoskeletal: Positive joint pain, fibromyalgia, neck pain and back pain. Lymphadenopathy: No lymphadenopathy. Hematology: No anemia or easy bruising.   Blood pressure 116/68, pulse 66, temperature 98.5 F (36.9 C), temperature source Oral, resp. rate 20, height 5\' 7"  (1.702 m), weight (!) 140.3 kg (309 lb 4.8 oz), SpO2 98 %. Body mass index is 48.44 kg/m. General appearance: alert, cooperative, appears stated age and mild respiratory distress Head: Normocephalic, atraumatic. Eyes: Brown eyes, pink conjunctiva, corneas clear. PERRL, EOM's intact. Neck: No adenopathy, no carotid bruit, no JVD, supple, symmetrical, trachea midline and thyroid not enlarged. Resp: Clear to auscultation bilaterally. Cardio: Regular rate and rhythm, S1, S2 normal, II/VI systolic murmur, no click, rub or gallop GI: Soft, non-tender; bowel sounds normal; no organomegaly. Extremities: No edema, cyanosis or clubbing. Skin: Warm and dry.  Neurologic: Alert and oriented X 3, normal strength. Normal coordination and gait.  Assessment/Plan Acute coronary syndrome CAD Asthma Obesity Hypertension Hypothyroidism Type 2 DM  Place in observation. R/O MI. Nuclear stress test in AM.  [name], MD  05/14/2018, 1:39 PM

## 2018-05-15 ENCOUNTER — Observation Stay (HOSPITAL_COMMUNITY): Payer: Medicaid Other

## 2018-05-15 DIAGNOSIS — R0789 Other chest pain: Secondary | ICD-10-CM | POA: Diagnosis not present

## 2018-05-15 LAB — BASIC METABOLIC PANEL
Anion gap: 9 (ref 5–15)
BUN: 15 mg/dL (ref 8–23)
CO2: 29 mmol/L (ref 22–32)
CREATININE: 1.45 mg/dL — AB (ref 0.44–1.00)
Calcium: 8.6 mg/dL — ABNORMAL LOW (ref 8.9–10.3)
Chloride: 99 mmol/L (ref 98–111)
GFR, EST AFRICAN AMERICAN: 44 mL/min — AB (ref >60)
GFR, EST NON AFRICAN AMERICAN: 38 mL/min — AB (ref >60)
Glucose, Bld: 306 mg/dL — ABNORMAL HIGH (ref 70–99)
Potassium: 4.2 mmol/L (ref 3.5–5.1)
SODIUM: 137 mmol/L (ref 135–145)

## 2018-05-15 LAB — HEPARIN LEVEL (UNFRACTIONATED)
HEPARIN UNFRACTIONATED: 0.48 [IU]/mL (ref 0.30–0.70)
Heparin Unfractionated: 0.3 IU/mL (ref 0.30–0.70)

## 2018-05-15 LAB — CBC
HEMATOCRIT: 32.3 % — AB (ref 36.0–46.0)
Hemoglobin: 9.8 g/dL — ABNORMAL LOW (ref 12.0–15.0)
MCH: 21.4 pg — AB (ref 26.0–34.0)
MCHC: 30.3 g/dL (ref 30.0–36.0)
MCV: 70.5 fL — ABNORMAL LOW (ref 78.0–100.0)
PLATELETS: 247 10*3/uL (ref 150–400)
RBC: 4.58 MIL/uL (ref 3.87–5.11)
RDW: 18.4 % — ABNORMAL HIGH (ref 11.5–15.5)
WBC: 6.1 10*3/uL (ref 4.0–10.5)

## 2018-05-15 LAB — GLUCOSE, CAPILLARY
GLUCOSE-CAPILLARY: 357 mg/dL — AB (ref 70–99)
Glucose-Capillary: 253 mg/dL — ABNORMAL HIGH (ref 70–99)
Glucose-Capillary: 325 mg/dL — ABNORMAL HIGH (ref 70–99)
Glucose-Capillary: 276 mg/dL — ABNORMAL HIGH (ref 70–99)

## 2018-05-15 LAB — TROPONIN I

## 2018-05-15 LAB — LIPID PANEL
CHOL/HDL RATIO: 4.5 ratio
CHOLESTEROL: 139 mg/dL (ref 0–200)
HDL: 31 mg/dL — ABNORMAL LOW (ref >40)
LDL Cholesterol: 51 mg/dL (ref 0–99)
TRIGLYCERIDES: 286 mg/dL — AB (ref <150)
VLDL: 57 mg/dL — ABNORMAL HIGH (ref 0–40)

## 2018-05-15 LAB — HIV ANTIBODY (ROUTINE TESTING W REFLEX): HIV Screen 4th Generation wRfx: NONREACTIVE

## 2018-05-15 MED ORDER — INSULIN ASPART PROT & ASPART (70-30 MIX) 100 UNIT/ML ~~LOC~~ SUSP
50.0000 [IU] | Freq: Two times a day (BID) | SUBCUTANEOUS | Status: DC
  Administered 2018-05-16 – 2018-05-17 (×2): 50 [IU] via SUBCUTANEOUS
  Filled 2018-05-15: qty 10

## 2018-05-15 MED ORDER — INSULIN ASPART 100 UNIT/ML ~~LOC~~ SOLN: 6.0000 [IU] | Freq: Three times a day (TID) | SUBCUTANEOUS | Status: DC

## 2018-05-15 MED ORDER — REGADENOSON 0.4 MG/5ML IV SOLN
0.4000 mg | Freq: Once | INTRAVENOUS | Status: AC
  Administered 2018-05-15: 0.4 mg via INTRAVENOUS
  Filled 2018-05-15: qty 5

## 2018-05-15 MED ORDER — INSULIN ASPART PROT & ASPART (70-30 MIX) 100 UNIT/ML ~~LOC~~ SUSP
40.0000 [IU] | Freq: Two times a day (BID) | SUBCUTANEOUS | Status: DC
  Administered 2018-05-15 (×2): 40 [IU] via SUBCUTANEOUS
  Filled 2018-05-15 (×2): qty 10

## 2018-05-15 MED ORDER — FERROUS SULFATE 325 (65 FE) MG PO TABS
325.0000 mg | ORAL_TABLET | Freq: Every day | ORAL | Status: DC
  Administered 2018-05-16 – 2018-05-17 (×3): 325 mg via ORAL
  Filled 2018-05-15 (×3): qty 1

## 2018-05-15 MED ORDER — REGADENOSON 0.4 MG/5ML IV SOLN
INTRAVENOUS | Status: AC
  Administered 2018-05-15: 0.4 mg via INTRAVENOUS
  Filled 2018-05-15: qty 5

## 2018-05-15 MED ORDER — METFORMIN HCL 500 MG PO TABS: 500.0000 mg | ORAL_TABLET | Freq: Two times a day (BID) | ORAL | Status: DC

## 2018-05-15 MED ORDER — TECHNETIUM TC 99M TETROFOSMIN IV KIT
30.0000 | PACK | Freq: Once | INTRAVENOUS | Status: AC | PRN
  Administered 2018-05-15: 30 via INTRAVENOUS

## 2018-05-15 MED ORDER — INSULIN ASPART 100 UNIT/ML ~~LOC~~ SOLN: 0.0000 [IU] | Freq: Three times a day (TID) | SUBCUTANEOUS | Status: DC

## 2018-05-15 MED ORDER — INSULIN ASPART PROT & ASPART (70-30 MIX) 100 UNIT/ML ~~LOC~~ SUSP
40.0000 [IU] | Freq: Two times a day (BID) | SUBCUTANEOUS | Status: DC
  Filled 2018-05-15: qty 10

## 2018-05-15 MED ORDER — METFORMIN HCL 500 MG PO TABS
500.0000 mg | ORAL_TABLET | Freq: Two times a day (BID) | ORAL | Status: DC
  Filled 2018-05-15: qty 1

## 2018-05-15 NOTE — Progress Notes (Signed)
[place] for heparin Indication: chest pain/ACS  Heparin Dosing Weight: 96 kg  Labs: Recent Labs    05/14/18 1349 05/14/18 2000 05/15/18 0129 05/15/18 0555  HGB  --   --  9.8*  --   HCT  --   --  32.3*  --   PLT  --   --  247  --   APTT 26  --   --   --   LABPROT 13.4  --   --   --   INR 1.03  --   --   --   HEPARINUNFRC  --  0.14*  --  0.30  CREATININE 1.17*  --  1.45*  --   TROPONINI <0.03 <0.03 <0.03  --     Assessment: 45 yof presenting with CP. Pharmacy consulted to dose heparin. Not on anticoagulation PTA.  -Heparin level now 0.3 units/ml, low end of therapeutic range  Goal of Therapy:  Heparin level 0.3-0.7 units/ml Monitor platelets by anticoagulation protocol: Yes   Plan:  -Increase heparin to 1700 units/hr -Heparin level in 6 hours and daily wth CBC daily  Thanks for allowing pharmacy to be a part of this patient's care.  Excell Seltzer, PharmD Clinical Pharmacist    05/15/2018 7:35 AM

## 2018-05-15 NOTE — H&P (View-Only) (Signed)
Ref: Mack Hook, MD   Subjective:  Awake. Mild chest pain with NM myocardial perfusion stress test. Resting images are pending. Uncontrolled DM with 12.7 Hgb A1C.  Objective:  Vital Signs in the last 24 hours: Temp:  [98.1 F (36.7 C)-98.5 F (36.9 C)] 98.3 F (36.8 C) (07/11 1213) Pulse Rate:  [67-76] 72 (07/11 1213) Cardiac Rhythm: Normal sinus rhythm (07/11 0700) Resp:  [18] 18 (07/11 0933) BP: (108-147)/(52-96) 120/61 (07/11 1213) SpO2:  [95 %-100 %] 98 % (07/11 1430) Weight:  [140.6 kg (310 lb)] 140.6 kg (310 lb) (07/11 0511)  Physical Exam: BP Readings from Last 1 Encounters:  05/15/18 120/61     Wt Readings from Last 1 Encounters:  05/15/18 (!) 140.6 kg (310 lb)    Weight change:  Body mass index is 48.55 kg/m. HEENT: [place]/AT, Eyes-Brown, Conjunctiva-Pale pink, Sclera-Non-icteric Neck: No JVD, No bruit, Trachea midline. Lungs:  Clear, Bilateral. Cardiac:  Regular rhythm, normal S1 and S2, no S3. II/VI systolic murmur. Abdomen:  Soft, non-tender. BS present. Extremities:  Trace edema present. No cyanosis. No clubbing. CNS: AxOx3, Cranial nerves grossly intact, moves all 4 extremities.  Skin: Warm and dry.   Intake/Output from previous day: 07/10 0701 - 07/11 0700 In: 652.5 [P.O.:480; I.V.:172.5] Out: 1650 [Urine:1650]    Lab Results: BMET    Component Value Date/Time   NA 137 05/15/2018 0129   NA 139 05/14/2018 1349   NA 134 (L) 06/22/2017 1825   K 4.2 05/15/2018 0129   K 3.7 05/14/2018 1349   K 4.1 06/22/2017 1825   CL 99 05/15/2018 0129   CL 101 05/14/2018 1349   CL 98 (L) 06/22/2017 1825   CO2 29 05/15/2018 0129   CO2 29 05/14/2018 1349   CO2 25 06/22/2017 1825   GLUCOSE 306 (H) 05/15/2018 0129   GLUCOSE 69 (L) 05/14/2018 1349   GLUCOSE 236 (H) 06/22/2017 1825   BUN 15 05/15/2018 0129   BUN 16 05/14/2018 1349   BUN 19 06/22/2017 1825   CREATININE 1.45 (H) 05/15/2018 0129   CREATININE 1.17 (H) 05/14/2018 1349   CREATININE 1.09 (H)  06/22/2017 1825   CALCIUM 8.6 (L) 05/15/2018 0129   CALCIUM 9.0 05/14/2018 1349   CALCIUM 9.0 06/22/2017 1825   GFRNONAA 38 (L) 05/15/2018 0129   GFRNONAA 49 (L) 05/14/2018 1349   GFRNONAA 54 (L) 06/22/2017 1825   GFRAA 44 (L) 05/15/2018 0129   GFRAA 57 (L) 05/14/2018 1349   GFRAA >60 06/22/2017 1825   CBC    Component Value Date/Time   WBC 6.1 05/15/2018 0129   RBC 4.58 05/15/2018 0129   HGB 9.8 (L) 05/15/2018 0129   HCT 32.3 (L) 05/15/2018 0129   PLT 247 05/15/2018 0129   MCV 70.5 (L) 05/15/2018 0129   MCH 21.4 (L) 05/15/2018 0129   MCHC 30.3 05/15/2018 0129   RDW 18.4 (H) 05/15/2018 0129   LYMPHSABS 3.1 06/23/2017 0434   MONOABS 0.3 06/23/2017 0434   EOSABS 0.1 06/23/2017 0434   BASOSABS 0.0 06/23/2017 0434   HEPATIC Function Panel Recent Labs    05/14/18 1349  PROT 6.7   HEMOGLOBIN A1C No components found for: HGA1C,  MPG CARDIAC ENZYMES Lab Results  Component Value Date   CKTOTAL 117 09/13/2016   CKMB 2.2 04/28/2011   TROPONINI <0.03 05/15/2018   TROPONINI <0.03 05/14/2018   TROPONINI <0.03 05/14/2018   BNP No results for input(s): PROBNP in the last 8760 hours. TSH No results for input(s): TSH in the last 8760 hours.  CHOLESTEROL Recent Labs    06/23/17 0434 05/15/18 0129  CHOL 179 139    Scheduled Meds: . allopurinol  100 mg Oral Daily  . amLODipine  5 mg Oral Daily  . aspirin EC  81 mg Oral Daily  . baclofen  20 mg Oral BID  . brimonidine  1 drop Both Eyes Q12H   And  . timolol  1 drop Both Eyes BID  . cycloSPORINE  1 drop Both Eyes BID  . docusate sodium  100 mg Oral Daily  . fenofibrate  160 mg Oral Daily  . ferrous sulfate  325 mg Oral Q breakfast  . [START ON 05/16/2018] insulin aspart  0-20 Units Subcutaneous TID WC  . [START ON 05/16/2018] insulin aspart  6 Units Subcutaneous TID WC  . [START ON 05/16/2018] insulin aspart protamine- aspart  50 Units Subcutaneous BID WC  . ipratropium  2 spray Each Nare Q12H  . ipratropium  0.5 mg  Nebulization TID  . latanoprost  1 drop Both Eyes QHS  . loratadine  10 mg Oral BH-q7a  . magnesium oxide  400 mg Oral Daily  . [START ON 05/18/2018] metFORMIN  500 mg Oral BID WC  . metoCLOPramide  10 mg Oral BID  . polyethylene glycol  17 g Oral Daily  . potassium chloride  10 mEq Oral BID   Continuous Infusions: . heparin 1,700 Units/hr (05/15/18 1744)   PRN Meds:.acetaminophen, HYDROcodone-acetaminophen, nitroGLYCERIN  Assessment/Plan: Acute coronary syndrome CAD Asthma Obesity Type 2 DM, uncontrolled Hypertension Hypothyroidism  Increase insulin dose and meal time coverage.   LOS: 0 days    Dixie Dials  MD  05/15/2018, 6:00 PM

## 2018-05-15 NOTE — Progress Notes (Addendum)
Spoke with patient about her diabetes. Was diagnosed about 20 years ago. States that she takes Novolin [facility] insulin 70/30 60 units BID and Metformin at home. States that she checks her blood sugars 2-3 times per day and usually gets results of 100's.  Noted that HgbA1C is 12.7% which is equal to greater than 300 mg/dl on an average. States that she does take her insulin every day. States that she "gets her insulin for free."  Recommend that she follow up with her PCP at discharge. Discussed and given information on her numbers, A1C, and general diabetes care for review.  Harvel Ricks RN BSN CDE Diabetes Coordinator Pager: [phone]  8am-5pm

## 2018-05-15 NOTE — Progress Notes (Signed)
Ref: Mack Hook, MD   Subjective:  Awake. Mild chest pain with NM myocardial perfusion stress test. Resting images are pending. Uncontrolled DM with 12.7 Hgb A1C.  Objective:  Vital Signs in the last 24 hours: Temp:  [98.1 F (36.7 C)-98.5 F (36.9 C)] 98.3 F (36.8 C) (07/11 1213) Pulse Rate:  [67-76] 72 (07/11 1213) Cardiac Rhythm: Normal sinus rhythm (07/11 0700) Resp:  [18] 18 (07/11 0933) BP: (108-147)/(52-96) 120/61 (07/11 1213) SpO2:  [95 %-100 %] 98 % (07/11 1430) Weight:  [140.6 kg (310 lb)] 140.6 kg (310 lb) (07/11 0511)  Physical Exam: BP Readings from Last 1 Encounters:  05/15/18 120/61     Wt Readings from Last 1 Encounters:  05/15/18 (!) 140.6 kg (310 lb)    Weight change:  Body mass index is 48.55 kg/m. HEENT: [place]/AT, Eyes-Brown, Conjunctiva-Pale pink, Sclera-Non-icteric Neck: No JVD, No bruit, Trachea midline. Lungs:  Clear, Bilateral. Cardiac:  Regular rhythm, normal S1 and S2, no S3. II/VI systolic murmur. Abdomen:  Soft, non-tender. BS present. Extremities:  Trace edema present. No cyanosis. No clubbing. CNS: AxOx3, Cranial nerves grossly intact, moves all 4 extremities.  Skin: Warm and dry.   Intake/Output from previous day: 07/10 0701 - 07/11 0700 In: 652.5 [P.O.:480; I.V.:172.5] Out: 1650 [Urine:1650]    Lab Results: BMET    Component Value Date/Time   NA 137 05/15/2018 0129   NA 139 05/14/2018 1349   NA 134 (L) 06/22/2017 1825   K 4.2 05/15/2018 0129   K 3.7 05/14/2018 1349   K 4.1 06/22/2017 1825   CL 99 05/15/2018 0129   CL 101 05/14/2018 1349   CL 98 (L) 06/22/2017 1825   CO2 29 05/15/2018 0129   CO2 29 05/14/2018 1349   CO2 25 06/22/2017 1825   GLUCOSE 306 (H) 05/15/2018 0129   GLUCOSE 69 (L) 05/14/2018 1349   GLUCOSE 236 (H) 06/22/2017 1825   BUN 15 05/15/2018 0129   BUN 16 05/14/2018 1349   BUN 19 06/22/2017 1825   CREATININE 1.45 (H) 05/15/2018 0129   CREATININE 1.17 (H) 05/14/2018 1349   CREATININE 1.09 (H)  06/22/2017 1825   CALCIUM 8.6 (L) 05/15/2018 0129   CALCIUM 9.0 05/14/2018 1349   CALCIUM 9.0 06/22/2017 1825   GFRNONAA 38 (L) 05/15/2018 0129   GFRNONAA 49 (L) 05/14/2018 1349   GFRNONAA 54 (L) 06/22/2017 1825   GFRAA 44 (L) 05/15/2018 0129   GFRAA 57 (L) 05/14/2018 1349   GFRAA >60 06/22/2017 1825   CBC    Component Value Date/Time   WBC 6.1 05/15/2018 0129   RBC 4.58 05/15/2018 0129   HGB 9.8 (L) 05/15/2018 0129   HCT 32.3 (L) 05/15/2018 0129   PLT 247 05/15/2018 0129   MCV 70.5 (L) 05/15/2018 0129   MCH 21.4 (L) 05/15/2018 0129   MCHC 30.3 05/15/2018 0129   RDW 18.4 (H) 05/15/2018 0129   LYMPHSABS 3.1 06/23/2017 0434   MONOABS 0.3 06/23/2017 0434   EOSABS 0.1 06/23/2017 0434   BASOSABS 0.0 06/23/2017 0434   HEPATIC Function Panel Recent Labs    05/14/18 1349  PROT 6.7   HEMOGLOBIN A1C No components found for: HGA1C,  MPG CARDIAC ENZYMES Lab Results  Component Value Date   CKTOTAL 117 09/13/2016   CKMB 2.2 04/28/2011   TROPONINI <0.03 05/15/2018   TROPONINI <0.03 05/14/2018   TROPONINI <0.03 05/14/2018   BNP No results for input(s): PROBNP in the last 8760 hours. TSH No results for input(s): TSH in the last 8760 hours.  CHOLESTEROL Recent Labs    06/23/17 0434 05/15/18 0129  CHOL 179 139    Scheduled Meds: . allopurinol  100 mg Oral Daily  . amLODipine  5 mg Oral Daily  . aspirin EC  81 mg Oral Daily  . baclofen  20 mg Oral BID  . brimonidine  1 drop Both Eyes Q12H   And  . timolol  1 drop Both Eyes BID  . cycloSPORINE  1 drop Both Eyes BID  . docusate sodium  100 mg Oral Daily  . fenofibrate  160 mg Oral Daily  . ferrous sulfate  325 mg Oral Q breakfast  . [START ON 05/16/2018] insulin aspart  0-20 Units Subcutaneous TID WC  . [START ON 05/16/2018] insulin aspart  6 Units Subcutaneous TID WC  . [START ON 05/16/2018] insulin aspart protamine- aspart  50 Units Subcutaneous BID WC  . ipratropium  2 spray Each Nare Q12H  . ipratropium  0.5 mg  Nebulization TID  . latanoprost  1 drop Both Eyes QHS  . loratadine  10 mg Oral BH-q7a  . magnesium oxide  400 mg Oral Daily  . [START ON 05/18/2018] metFORMIN  500 mg Oral BID WC  . metoCLOPramide  10 mg Oral BID  . polyethylene glycol  17 g Oral Daily  . potassium chloride  10 mEq Oral BID   Continuous Infusions: . heparin 1,700 Units/hr (05/15/18 1744)   PRN Meds:.acetaminophen, HYDROcodone-acetaminophen, nitroGLYCERIN  Assessment/Plan: Acute coronary syndrome CAD Asthma Obesity Type 2 DM, uncontrolled Hypertension Hypothyroidism  Increase insulin dose and meal time coverage.   LOS: 0 days    Dixie Dials  MD  05/15/2018, 6:00 PM

## 2018-05-15 NOTE — Progress Notes (Signed)
[place] for heparin Indication: chest pain/ACS  Heparin Dosing Weight: 96 kg  Labs: Recent Labs    05/14/18 1349 05/14/18 2000 05/15/18 0129 05/15/18 0555 05/15/18 1312  HGB  --   --  9.8*  --   --   HCT  --   --  32.3*  --   --   PLT  --   --  247  --   --   APTT 26  --   --   --   --   LABPROT 13.4  --   --   --   --   INR 1.03  --   --   --   --   HEPARINUNFRC  --  0.14*  --  0.30 0.48  CREATININE 1.17*  --  1.45*  --   --   TROPONINI <0.03 <0.03 <0.03  --   --     Assessment: 66 yof presenting with CP. Pharmacy consulted to dose heparin for ACS. Not on anticoagulation PTA. Heparin level remains therapeutic. Hg 9.8, plt wnl. No bleed documented.  Goal of Therapy:  Heparin level 0.3-0.7 units/ml Monitor platelets by anticoagulation protocol: Yes   Plan:  Continue heparin at 1700 units/h Daily heparin level/CBC Monitor s/sx bleeding Nuc stress test 7/11 - f/u Cardiology plans  Elicia Lamp, PharmD, BCPS Clinical Pharmacist Clinical phone [phone] Please check AMION for all [facility] contact numbers 05/15/2018 1:52 PM

## 2018-05-15 NOTE — Progress Notes (Signed)
Inpatient Diabetes Program Recommendations  AACE/ADA: New Consensus Statement on Inpatient Glycemic Control (2015)  Target Ranges:  Prepandial:   less than 140 mg/dL      Peak postprandial:   less than 180 mg/dL (1-2 hours)      Critically ill patients:  140 - 180 mg/dL   Lab Results  Component Value Date   GLUCAP 253 (H) 05/15/2018   HGBA1C 12.7 (H) 05/14/2018    Review of Glycemic Control  Diabetes history: Type 2 Outpatient Diabetes medications: Novolin 70/30 60 units BID, Metformin Current orders for Inpatient glycemic control: 70/30 40 units BID, Metformin  Inpatient Diabetes Program Recommendations:   Noted that blood sugars continue to be greater than 180 mg/dl.  Recommend adding Novolog SENSITIVE correction scale TID & HS while in the hospital if blood sugars continue to be elevated.   Harvel Ricks RN BSN CDE Diabetes Coordinator Pager: [phone]  8am-5pm

## 2018-05-16 ENCOUNTER — Encounter (HOSPITAL_COMMUNITY)
Admission: AD | Disposition: A | Discharge status: Home / Self Care | Payer: Self-pay | Source: Ambulatory Visit | Attending: Cardiovascular Disease

## 2018-05-16 ENCOUNTER — Encounter (HOSPITAL_COMMUNITY): Payer: Medicaid Other

## 2018-05-16 DIAGNOSIS — D571 Sickle-cell disease without crisis: Secondary | ICD-10-CM | POA: Diagnosis not present

## 2018-05-16 DIAGNOSIS — I110 Hypertensive heart disease with heart failure: Secondary | ICD-10-CM | POA: Diagnosis not present

## 2018-05-16 DIAGNOSIS — I509 Heart failure, unspecified: Secondary | ICD-10-CM | POA: Diagnosis not present

## 2018-05-16 DIAGNOSIS — R0789 Other chest pain: Secondary | ICD-10-CM | POA: Diagnosis not present

## 2018-05-16 HISTORY — PX: LEFT HEART CATH AND CORONARY ANGIOGRAPHY: CATH118249

## 2018-05-16 LAB — CBC
HEMATOCRIT: 32.3 % — AB (ref 36.0–46.0)
HEMOGLOBIN: 9.7 g/dL — AB (ref 12.0–15.0)
MCH: 21.2 pg — AB (ref 26.0–34.0)
MCHC: 30 g/dL (ref 30.0–36.0)
MCV: 70.5 fL — AB (ref 78.0–100.0)
Platelets: 263 10*3/uL (ref 150–400)
RBC: 4.58 MIL/uL (ref 3.87–5.11)
RDW: 18.3 % — ABNORMAL HIGH (ref 11.5–15.5)
WBC: 5.6 10*3/uL (ref 4.0–10.5)

## 2018-05-16 LAB — GLUCOSE, CAPILLARY
GLUCOSE-CAPILLARY: 216 mg/dL — AB (ref 70–99)
GLUCOSE-CAPILLARY: 209 mg/dL — AB (ref 70–99)
GLUCOSE-CAPILLARY: 275 mg/dL — AB (ref 70–99)
Glucose-Capillary: 193 mg/dL — ABNORMAL HIGH (ref 70–99)

## 2018-05-16 LAB — HEPARIN LEVEL (UNFRACTIONATED): Heparin Unfractionated: 0.34 IU/mL (ref 0.30–0.70)

## 2018-05-16 SURGERY — LEFT HEART CATH AND CORONARY ANGIOGRAPHY
Anesthesia: LOCAL

## 2018-05-16 MED ORDER — LIDOCAINE HCL (PF) 1 % IJ SOLN
INTRAMUSCULAR | Status: AC
  Filled 2018-05-16: qty 30

## 2018-05-16 MED ORDER — MIDAZOLAM HCL 2 MG/2ML IJ SOLN
INTRAMUSCULAR | Status: AC
  Filled 2018-05-16: qty 2

## 2018-05-16 MED ORDER — SODIUM CHLORIDE 0.9 % IV SOLN: INTRAVENOUS | Status: DC

## 2018-05-16 MED ORDER — IOHEXOL 350 MG/ML SOLN
INTRAVENOUS | Status: DC | PRN
  Administered 2018-05-16: 35 mL via INTRAVENOUS

## 2018-05-16 MED ORDER — SODIUM CHLORIDE 0.9% FLUSH: 3.0000 mL | INTRAVENOUS | Status: DC | PRN

## 2018-05-16 MED ORDER — HEPARIN (PORCINE) IN NACL 1000-0.9 UT/500ML-% IV SOLN
INTRAVENOUS | Status: AC
  Filled 2018-05-16: qty 1000

## 2018-05-16 MED ORDER — INSULIN ASPART 100 UNIT/ML ~~LOC~~ SOLN
6.0000 [IU] | Freq: Three times a day (TID) | SUBCUTANEOUS | Status: DC
  Administered 2018-05-16 – 2018-05-17 (×3): 6 [IU] via SUBCUTANEOUS

## 2018-05-16 MED ORDER — IPRATROPIUM BROMIDE 0.02 % IN SOLN: 0.5000 mg | Freq: Four times a day (QID) | RESPIRATORY_TRACT | Status: DC | PRN

## 2018-05-16 MED ORDER — HEPARIN (PORCINE) IN NACL 1000-0.9 UT/500ML-% IV SOLN
INTRAVENOUS | Status: DC | PRN
  Administered 2018-05-16 (×2): 500 mL

## 2018-05-16 MED ORDER — LIDOCAINE HCL (PF) 1 % IJ SOLN
INTRAMUSCULAR | Status: DC | PRN
  Administered 2018-05-16: 20 mL

## 2018-05-16 MED ORDER — MIDAZOLAM HCL 2 MG/2ML IJ SOLN
INTRAMUSCULAR | Status: DC | PRN
  Administered 2018-05-16: 1 mg via INTRAVENOUS

## 2018-05-16 MED ORDER — SODIUM CHLORIDE 0.9 % IV SOLN
INTRAVENOUS | Status: AC | PRN
  Administered 2018-05-16: 100 mL/h via INTRAVENOUS

## 2018-05-16 MED ORDER — INSULIN ASPART 100 UNIT/ML ~~LOC~~ SOLN
0.0000 [IU] | Freq: Three times a day (TID) | SUBCUTANEOUS | Status: DC
  Administered 2018-05-16 – 2018-05-17 (×3): 7 [IU] via SUBCUTANEOUS

## 2018-05-16 MED ORDER — SODIUM CHLORIDE 0.9% FLUSH: 3.0000 mL | Freq: Two times a day (BID) | INTRAVENOUS | Status: DC

## 2018-05-16 MED ORDER — SODIUM CHLORIDE 0.9 % IV SOLN
INTRAVENOUS | Status: AC
  Administered 2018-05-16: 15:00:00 via INTRAVENOUS

## 2018-05-16 MED ORDER — TECHNETIUM TC 99M TETROFOSMIN IV KIT
30.0000 | PACK | Freq: Once | INTRAVENOUS | Status: AC | PRN
  Administered 2018-05-16: 30 via INTRAVENOUS

## 2018-05-16 MED ORDER — SODIUM CHLORIDE 0.9 % IV SOLN: 250.0000 mL | INTRAVENOUS | Status: DC | PRN

## 2018-05-16 MED ORDER — HYDRALAZINE HCL 20 MG/ML IJ SOLN
INTRAMUSCULAR | Status: DC | PRN
  Administered 2018-05-16 (×2): 10 mg via INTRAVENOUS

## 2018-05-16 MED ORDER — FENTANYL CITRATE (PF) 100 MCG/2ML IJ SOLN
INTRAMUSCULAR | Status: AC
  Filled 2018-05-16: qty 2

## 2018-05-16 MED ORDER — FENTANYL CITRATE (PF) 100 MCG/2ML IJ SOLN
INTRAMUSCULAR | Status: DC | PRN
  Administered 2018-05-16: 25 ug via INTRAVENOUS

## 2018-05-16 SURGICAL SUPPLY — 9 items
CATH INFINITI 5FR MULTPACK ANG (CATHETERS) ×1 IMPLANT
HOVERMATT SINGLE USE (MISCELLANEOUS) ×1 IMPLANT
KIT HEART LEFT (KITS) ×2 IMPLANT
NDL SMART 18GX3.5CM LONG (NEEDLE) IMPLANT
NEEDLE SMART 18GX3.5CM LONG (NEEDLE) ×2 IMPLANT
PACK CARDIAC CATHETERIZATION (CUSTOM PROCEDURE TRAY) ×2 IMPLANT
SHEATH PINNACLE 5F 10CM (SHEATH) ×1 IMPLANT
TRANSDUCER W/STOPCOCK (MISCELLANEOUS) ×2 IMPLANT
WIRE EMERALD 3MM-J .035X150CM (WIRE) ×1 IMPLANT

## 2018-05-16 NOTE — Progress Notes (Addendum)
Spoke with Md about pt experiencing chest pain and wants to eat lunch md will wait for results from stress imaging.

## 2018-05-16 NOTE — Progress Notes (Signed)
Results for SHELITHA, MAGLEY (MRN [id number]) as of 05/16/2018 12:11  Ref. Range 05/15/2018 12:12 05/15/2018 16:19 05/15/2018 21:28 05/16/2018 07:41 05/16/2018 11:36  Glucose-Capillary Latest Ref Range: 70 - 99 mg/dL 325 (H) 357 (H) 276 (H) 193 (H) 216 (H)  Noted that patient has not had 70/30 insulin this am due to having a procedure. Spoke with Diplomatic Services operational officer. States that patient just got back from procedure. Recommend that patient get only Novolog correction scale for 12 noon blood sugar, hold 70/30 insulin now and give the 70/30 insulin 50 units at supper time.   Also, recommend discontinuing the Novolog 6 units TID with meals since the 70/30 insulin includes the meal coverage. Continue Novolog RESISTANT correction scale TID & HS as ordered.   Harvel Ricks RN BSN CDE Diabetes Coordinator Pager: [phone]  8am-5pm

## 2018-05-16 NOTE — Progress Notes (Signed)
Patient is experiencing Chest pain with touch to the left side with is on going, laughing and talking on the phone with family, asked for pain relieve medication. And a soda to belch. EKG to be performed.

## 2018-05-16 NOTE — Progress Notes (Signed)
Patient is alert and orient Cath site level "0" with no pain. Up from 4 hour bed restriction with no problems.

## 2018-05-16 NOTE — Progress Notes (Signed)
Patient is alert and oriented denies pain. Heparin is d/c per Doylene Canard

## 2018-05-16 NOTE — Interval H&P Note (Signed)
History and Physical Interval Note:  05/16/2018 1:34 PM  Chelsea Lester  has presented today for surgery, with the diagnosis of CP  The various methods of treatment have been discussed with the patient and family. After consideration of risks, benefits and other options for treatment, the patient has consented to  Procedure(s): LEFT HEART CATH AND CORONARY ANGIOGRAPHY (N/A) as a surgical intervention .  The patient's history has been reviewed, patient examined, no change in status, stable for surgery.  I have reviewed the patient's chart and labs.  Questions were answered to the patient's satisfaction.     [name]

## 2018-05-16 NOTE — Progress Notes (Signed)
Patient NPO for part 2 Stress test

## 2018-05-16 NOTE — Progress Notes (Signed)
Site area: rt groin fa sheath Site Prior to Removal:  Level 0 Pressure Applied For: 20 minutes Manual:   yes Patient Status During Pull:  stable Post Pull Site:  Level 0 Post Pull Instructions Given:  yes Post Pull Pulses Present: rt dp dopplered Dressing Applied:  Gauze and tegaderm Bedrest begins @ 3614 Comments:

## 2018-05-16 NOTE — Progress Notes (Signed)
Patient will remain NPO and aware waiting on orders for consent per MD.

## 2018-05-17 DIAGNOSIS — I509 Heart failure, unspecified: Secondary | ICD-10-CM | POA: Diagnosis not present

## 2018-05-17 DIAGNOSIS — I110 Hypertensive heart disease with heart failure: Secondary | ICD-10-CM | POA: Diagnosis not present

## 2018-05-17 DIAGNOSIS — D571 Sickle-cell disease without crisis: Secondary | ICD-10-CM | POA: Diagnosis not present

## 2018-05-17 DIAGNOSIS — R0789 Other chest pain: Secondary | ICD-10-CM | POA: Diagnosis not present

## 2018-05-17 LAB — BASIC METABOLIC PANEL
Anion gap: 9 (ref 5–15)
BUN: 16 mg/dL (ref 8–23)
CHLORIDE: 103 mmol/L (ref 98–111)
CO2: 27 mmol/L (ref 22–32)
Calcium: 9.2 mg/dL (ref 8.9–10.3)
Creatinine, Ser: 1.11 mg/dL — ABNORMAL HIGH (ref 0.44–1.00)
GFR calc Af Amer: >60 mL/min (ref >60)
GFR calc non Af Amer: 52 mL/min — ABNORMAL LOW (ref >60)
Glucose, Bld: 227 mg/dL — ABNORMAL HIGH (ref 70–99)
POTASSIUM: 4.7 mmol/L (ref 3.5–5.1)
SODIUM: 139 mmol/L (ref 135–145)

## 2018-05-17 LAB — CBC
HEMATOCRIT: 33.6 % — AB (ref 36.0–46.0)
HEMOGLOBIN: 10.2 g/dL — AB (ref 12.0–15.0)
MCH: 21.3 pg — ABNORMAL LOW (ref 26.0–34.0)
MCHC: 30.4 g/dL (ref 30.0–36.0)
MCV: 70 fL — ABNORMAL LOW (ref 78.0–100.0)
Platelets: 270 10*3/uL (ref 150–400)
RBC: 4.8 MIL/uL (ref 3.87–5.11)
RDW: 18.6 % — ABNORMAL HIGH (ref 11.5–15.5)
WBC: 6.7 10*3/uL (ref 4.0–10.5)

## 2018-05-17 LAB — GLUCOSE, CAPILLARY: GLUCOSE-CAPILLARY: 217 mg/dL — AB (ref 70–99)

## 2018-05-17 MED ORDER — FERROUS SULFATE 325 (65 FE) MG PO TABS: 325.0000 mg | ORAL_TABLET | Freq: Every day | ORAL | Status: AC

## 2018-05-17 MED ORDER — METFORMIN HCL 500 MG PO TABS: 500.0000 mg | ORAL_TABLET | Freq: Two times a day (BID) | ORAL | Status: DC

## 2018-05-17 MED ORDER — POTASSIUM CHLORIDE ER 10 MEQ PO TBCR: 10.0000 meq | EXTENDED_RELEASE_TABLET | Freq: Two times a day (BID) | ORAL | Status: DC

## 2018-05-17 NOTE — Progress Notes (Signed)
Pt escorted off floor via wheelchair, by [facility] staff. No complaints/concerns at discharge. Pt states she will take daily meds when she arrives at home.

## 2018-05-17 NOTE — Progress Notes (Signed)
Call placed to [facility] to notify of telemetry monitoring d/c.   

## 2018-05-17 NOTE — Progress Notes (Signed)
Patient sleeping during shift report.      

## 2018-05-17 NOTE — Discharge Summary (Signed)
Physician Discharge Summary  Patient ID: Chelsea Lester MRN: [id number] DOB/AGE: 01-21-1956 62 y.o.  Admit date: 05/14/2018 Discharge date: 05/17/2018  Admission Diagnoses: Acute coronary syndrome CAD Asthma Obesity Hypertension Hypothyroidism Type 2 diabetes mellitus  Discharge Diagnoses:  Principal Problem:   Chest pain, precordial Active Problems:   Uncontrolled diabetes mellitus (HCC)   Hyperlipidemia   Obesity, Class III, BMI 40-49.9 (morbid obesity) (HCC)   Exophthalmos   Asthma   Hypertension   Hypothyroidism   Chronic iron deficiency anemia  Discharged Condition: good  Hospital Course: 62 year old female with past medical history of asthma, CAD, CHF, obstructive sleep apnea, dyslipidemia, hyperthyroidism with exophthalmos, obesity and gastroesophageal reflux disease had a recurrent left precordial chest pain increased with activity and decreased with rest and sublingual nitroglycerin use.   Patient underwent nuclear stress test that was suggestive of anterior wall ischemia.  She underwent cardiac catheterization that showed diffuse narrowing of the distal LAD without apparent stenosis.  Patient was advised to start lifestyle modification and see me in 1 week and primary care physician in 1 month.  Consults: cardiology  Significant Diagnostic Studies: labs: Normal WBC and Platelets count. Hgb low at 9.8. Normal Cholesterol levels with elevated Triglycerides level of 286 mg. Troponin-I were normal x 3. Hgb A1C of 12.7  EKG-NSR, non-specific T wave changes.  Nuclear stress test: Possible anterior wall ischemia.  Treatments: cardiac meds: metoprolol, HCTZ, potassium, amlodipine and aspirin.   Discharge Exam: Blood pressure 135/69, pulse 78, temperature 98.7 F (37.1 C), temperature source Oral, resp. rate 18, height 5\' 7"  (1.702 m), weight (!) 139.8 kg (308 lb 3.2 oz), SpO2 100 %. General appearance: alert, cooperative and appears stated age. Head: Normocephalic,  atraumatic. Eyes: Brown eyes, Pale pink conjunctiva, corneas clear. Positive exophthalmos.  Neck: No adenopathy, no carotid bruit, no JVD, supple, symmetrical, trachea midline and thyroid not enlarged. Resp: Clear to auscultation bilaterally. Cardio: Regular rate and rhythm, S1, S2 normal, II/VI systolic murmur, no click, rub or gallop. GI: Soft, non-tender; bowel sounds normal; no organomegaly. Extremities: No edema, cyanosis or clubbing. No right groin hematoma. Skin: Warm and dry.  Neurologic: Alert and oriented X 3, normal strength and tone. Normal coordination and gait.  Disposition: Discharge disposition: 01-Home or Self Care        Allergies as of 05/17/2018      Reactions   Suboxone [buprenorphine Hcl-naloxone Hcl] Other (See Comments)   Itchy rash--difficult history.   Ace Inhibitors Cough   UNSPECIFIED REACTION    Latex Rash   Percocet [oxycodone-acetaminophen] Itching          Medication List    STOP taking these medications   baclofen 20 MG tablet Commonly known as:  LIORESAL   furosemide 20 MG tablet Commonly known as:  LASIX   Oxycodone HCl 10 MG Tabs   pantoprazole 40 MG tablet Commonly known as:  PROTONIX   travoprost (benzalkonium) 0.004 % ophthalmic solution Commonly known as:  TRAVATAN     TAKE these medications   albuterol 90 MCG/ACT inhaler Commonly known as:  PROVENTIL,VENTOLIN Inhale 2 puffs into the lungs every 6 (six) hours as needed for shortness of breath. Wheezing or shortness of breath   allopurinol 100 MG tablet Commonly known as:  ZYLOPRIM Take 1 tablet (100 mg total) by mouth daily.   amLODipine 10 MG tablet Commonly known as:  NORVASC Take 1 tablet (10 mg total) by mouth daily. What changed:  how much to take   aspirin 81 MG tablet Take  81 mg by mouth every morning.   citalopram 10 MG tablet Commonly known as:  CELEXA Take 1 tablet (10 mg total) by mouth daily.   COMBIGAN 0.2-0.5 % ophthalmic solution Generic drug:   brimonidine-timolol Place 1 drop into both eyes every 12 (twelve) hours.   diclofenac sodium 1 % Gel Commonly known as:  VOLTAREN Apply 2 g topically 4 (four) times daily as needed (pain).   docusate sodium 100 MG capsule Commonly known as:  COLACE Take 1 capsule (100 mg total) by mouth daily.   ergocalciferol 50000 units capsule Commonly known as:  VITAMIN D2 Take 1 capsule (50,000 Units total) by mouth once a week. Usually takes on Saturday or fridays   fenofibrate 145 MG tablet Commonly known as:  TRICOR Take 1 tablet (145 mg total) by mouth daily.   ferrous sulfate 325 (65 FE) MG tablet Take 1 tablet (325 mg total) by mouth daily with breakfast.   fluticasone 50 MCG/ACT nasal spray Commonly known as:  FLONASE 2 sprays each nostril once daily What changed:    how much to take  how to take this  when to take this  additional instructions   fluticasone-salmeterol 115-21 MCG/ACT inhaler Commonly known as:  ADVAIR HFA Inhale 2 puffs into the lungs 2 (two) times daily.   hydrochlorothiazide 25 MG tablet Commonly known as:  HYDRODIURIL Take 25 mg by mouth daily.   HYDROcodone-acetaminophen 10-325 MG tablet Commonly known as:  NORCO Take 1 tablet by mouth 5 (five) times daily as needed for pain.   insulin NPH-regular Human (70-30) 100 UNIT/ML injection Commonly known as:  NOVOLIN 70/30 Inject 60 units BID What changed:    how much to take  how to take this  when to take this  additional instructions   ipratropium 0.02 % nebulizer solution Commonly known as:  ATROVENT Take 2.5 mLs (0.5 mg total) by nebulization 3 (three) times daily. Nebulize 0.5 mg three times daily   ipratropium 0.03 % nasal spray Commonly known as:  ATROVENT Place 2 sprays into both nostrils every 12 (twelve) hours.   Lancets Misc Check sugars twice daily before meals   loratadine 10 MG tablet Commonly known as:  CLARITIN Take 1 tablet (10 mg total) by mouth every morning.    Magnesium Oxide -Mg Supplement 250 MG Tabs Take 1 tablet by mouth daily.   metFORMIN 500 MG tablet Commonly known as:  GLUCOPHAGE Take 1 tablet (500 mg total) by mouth 2 (two) times daily with a meal. No metformin for 2 days then resume. What changed:  additional instructions   metoCLOPramide 10 MG tablet Commonly known as:  REGLAN Take 10 mg by mouth 2 (two) times daily.   metoprolol tartrate 50 MG tablet Commonly known as:  LOPRESSOR take 1 tablet by mouth twice a day   nitroGLYCERIN 0.4 MG SL tablet Commonly known as:  NITROSTAT Place 0.4 mg under the tongue every 5 (five) minutes as needed for chest pain.   omeprazole 40 MG capsule Commonly known as:  PRILOSEC Take 40 mg by mouth daily.   polyethylene glycol packet Commonly known as:  MIRALAX / GLYCOLAX Take 17 g by mouth daily. What changed:    when to take this  reasons to take this   potassium chloride 10 MEQ tablet Commonly known as:  K-DUR Take 1 tablet (10 mEq total) by mouth 2 (two) times daily.   promethazine 25 MG tablet Commonly known as:  PHENERGAN take 1/2 tablet by mouth every 6  hours if needed for nausea and vomiting What changed:  See the new instructions.   RESTASIS 0.05 % ophthalmic emulsion Generic drug:  cycloSPORINE Place 1 drop into both eyes 2 (two) times daily.   traZODone 100 MG tablet Commonly known as:  DESYREL Take 100 mg by mouth at bedtime.   VYZULTA 0.024 % Soln Generic drug:  Latanoprostene Bunod Apply 1 drop to eye 2 (two) times daily.      Follow-up Information    Mack Hook, MD. Schedule an appointment as soon as possible for a visit in 1 month(s).   Specialty:  Internal Medicine Contact information: [place] [address] [phone]        Dixie Dials, MD. Schedule an appointment as soon as possible for a visit in 1 week(s).   Specialty:  Cardiology Contact information: [place]  [id number] [phone]           Signed: [name] 05/17/2018, 9:31 AM

## 2018-05-19 ENCOUNTER — Encounter (HOSPITAL_COMMUNITY): Payer: Self-pay | Admitting: Cardiovascular Disease

## 2018-06-02 ENCOUNTER — Other Ambulatory Visit: Payer: Self-pay | Admitting: Internal Medicine

## 2018-06-30 ENCOUNTER — Other Ambulatory Visit: Payer: Self-pay | Admitting: Internal Medicine

## 2018-10-09 ENCOUNTER — Other Ambulatory Visit: Payer: Self-pay | Admitting: Family Medicine

## 2018-10-09 DIAGNOSIS — Z1231 Encounter for screening mammogram for malignant neoplasm of breast: Secondary | ICD-10-CM

## 2018-11-21 ENCOUNTER — Ambulatory Visit
Admission: RE | Admit: 2018-11-21 | Discharge: 2018-11-21 | Disposition: A | Discharge status: Home / Self Care | Payer: Medicaid Other | Source: Ambulatory Visit | Attending: Family Medicine | Admitting: Family Medicine

## 2018-11-21 DIAGNOSIS — Z1231 Encounter for screening mammogram for malignant neoplasm of breast: Secondary | ICD-10-CM

## 2019-05-05 ENCOUNTER — Emergency Department (HOSPITAL_COMMUNITY): Payer: Medicaid Other

## 2019-05-05 ENCOUNTER — Encounter (HOSPITAL_COMMUNITY): Payer: Self-pay | Admitting: Emergency Medicine

## 2019-05-05 ENCOUNTER — Emergency Department (HOSPITAL_COMMUNITY)
Admission: EM | Admit: 2019-05-05 | Discharge: 2019-05-05 | Disposition: A | Discharge status: Home / Self Care | Payer: Medicaid Other | Attending: Emergency Medicine | Admitting: Emergency Medicine

## 2019-05-05 ENCOUNTER — Other Ambulatory Visit: Payer: Self-pay

## 2019-05-05 DIAGNOSIS — Z91040 Latex allergy status: Secondary | ICD-10-CM | POA: Insufficient documentation

## 2019-05-05 DIAGNOSIS — R739 Hyperglycemia, unspecified: Secondary | ICD-10-CM

## 2019-05-05 DIAGNOSIS — I252 Old myocardial infarction: Secondary | ICD-10-CM | POA: Diagnosis not present

## 2019-05-05 DIAGNOSIS — Z7982 Long term (current) use of aspirin: Secondary | ICD-10-CM | POA: Insufficient documentation

## 2019-05-05 DIAGNOSIS — R079 Chest pain, unspecified: Secondary | ICD-10-CM | POA: Diagnosis present

## 2019-05-05 DIAGNOSIS — R42 Dizziness and giddiness: Secondary | ICD-10-CM

## 2019-05-05 DIAGNOSIS — Z20828 Contact with and (suspected) exposure to other viral communicable diseases: Secondary | ICD-10-CM | POA: Insufficient documentation

## 2019-05-05 DIAGNOSIS — Z794 Long term (current) use of insulin: Secondary | ICD-10-CM | POA: Diagnosis not present

## 2019-05-05 DIAGNOSIS — Z79899 Other long term (current) drug therapy: Secondary | ICD-10-CM | POA: Diagnosis not present

## 2019-05-05 DIAGNOSIS — I10 Essential (primary) hypertension: Secondary | ICD-10-CM | POA: Insufficient documentation

## 2019-05-05 DIAGNOSIS — E1165 Type 2 diabetes mellitus with hyperglycemia: Secondary | ICD-10-CM | POA: Diagnosis not present

## 2019-05-05 LAB — PROTIME-INR
INR: 1.1 (ref 0.8–1.2)
Prothrombin Time: 13.6 seconds (ref 11.4–15.2)

## 2019-05-05 LAB — COMPREHENSIVE METABOLIC PANEL
ALT: 30 U/L (ref 0–44)
AST: 19 U/L (ref 15–41)
Albumin: 3.6 g/dL (ref 3.5–5.0)
Alkaline Phosphatase: 82 U/L (ref 38–126)
Anion gap: 13 (ref 5–15)
BUN: 9 mg/dL (ref 8–23)
CO2: 32 mmol/L (ref 22–32)
Calcium: 8.5 mg/dL — ABNORMAL LOW (ref 8.9–10.3)
Chloride: 94 mmol/L — ABNORMAL LOW (ref 98–111)
Creatinine, Ser: 0.92 mg/dL (ref 0.44–1.00)
GFR calc Af Amer: >60 mL/min (ref >60)
GFR calc non Af Amer: >60 mL/min (ref >60)
Glucose, Bld: 296 mg/dL — ABNORMAL HIGH (ref 70–99)
Potassium: 3 mmol/L — ABNORMAL LOW (ref 3.5–5.1)
Sodium: 139 mmol/L (ref 135–145)
Total Bilirubin: 0.4 mg/dL (ref 0.3–1.2)
Total Protein: 7.2 g/dL (ref 6.5–8.1)

## 2019-05-05 LAB — CBC
HCT: 35.3 % — ABNORMAL LOW (ref 36.0–46.0)
Hemoglobin: 10.7 g/dL — ABNORMAL LOW (ref 12.0–15.0)
MCH: 20.8 pg — ABNORMAL LOW (ref 26.0–34.0)
MCHC: 30.3 g/dL (ref 30.0–36.0)
MCV: 68.7 fL — ABNORMAL LOW (ref 80.0–100.0)
Platelets: 268 10*3/uL (ref 150–400)
RBC: 5.14 MIL/uL — ABNORMAL HIGH (ref 3.87–5.11)
RDW: 19.2 % — ABNORMAL HIGH (ref 11.5–15.5)
WBC: 8.3 10*3/uL (ref 4.0–10.5)
nRBC: 0 % (ref 0.0–0.2)

## 2019-05-05 LAB — URINALYSIS, ROUTINE W REFLEX MICROSCOPIC
Bilirubin Urine: NEGATIVE
Glucose, UA: NEGATIVE mg/dL
Hgb urine dipstick: NEGATIVE
Ketones, ur: 5 mg/dL — AB
Leukocytes,Ua: NEGATIVE
Nitrite: NEGATIVE
Protein, ur: NEGATIVE mg/dL
Specific Gravity, Urine: 1.015 (ref 1.005–1.030)
pH: 5 (ref 5.0–8.0)

## 2019-05-05 LAB — CBG MONITORING, ED: Glucose-Capillary: 276 mg/dL — ABNORMAL HIGH (ref 70–99)

## 2019-05-05 LAB — LIPASE, BLOOD: Lipase: 28 U/L (ref 11–51)

## 2019-05-05 LAB — TROPONIN I (HIGH SENSITIVITY)
Troponin I (High Sensitivity): 3 ng/L (ref <18)
Troponin I (High Sensitivity): 2 ng/L (ref <18)

## 2019-05-05 LAB — SARS CORONAVIRUS 2 BY RT PCR (HOSPITAL ORDER, PERFORMED IN ~~LOC~~ HOSPITAL LAB): SARS Coronavirus 2: NEGATIVE

## 2019-05-05 MED ORDER — ASPIRIN 81 MG PO CHEW
324.0000 mg | CHEWABLE_TABLET | Freq: Once | ORAL | Status: AC
  Administered 2019-05-05: 324 mg via ORAL
  Filled 2019-05-05: qty 4

## 2019-05-05 MED ORDER — POTASSIUM CHLORIDE CRYS ER 20 MEQ PO TBCR
40.0000 meq | EXTENDED_RELEASE_TABLET | Freq: Once | ORAL | Status: AC
  Administered 2019-05-05: 13:00:00 40 meq via ORAL
  Filled 2019-05-05: qty 2

## 2019-05-05 NOTE — Discharge Instructions (Signed)
Continue to monitor your blood sugar.  Continue your current medications.  Follow up with your doctor to be rechecked in the next week or so.  Return as needed for worsening symptoms

## 2019-05-05 NOTE — ED Triage Notes (Addendum)
To ED via [facility] from home, c/o mid back pain, and mid chest pain started last night- worse with laying down in bed-- vomited this am x 5-- diarrhea x 4 yesterday.  States had a dizzy spell, could not get up, called husband, and had help getting to the bathroom after throwing up.

## 2019-05-05 NOTE — ED Notes (Signed)
Pt ambulated unassisted around the hallway. Pt said she felt tired and out of breath upon returning to the room. HR 110 and pulse oxygen at 100% when returned to her room.

## 2019-05-05 NOTE — ED Notes (Signed)
Patient verbalizes understanding of discharge instructions. Opportunity for questioning and answers were provided. Armband removed by staff, pt discharged from ED.  

## 2019-05-05 NOTE — ED Provider Notes (Signed)
[facility] EMERGENCY DEPARTMENT Provider Note   CSN: [id number] Arrival date & time: 05/05/19  0906    History   Chief Complaint Chief Complaint  Patient presents with   Back Pain   Hyperglycemia    HPI Keaisha Denisia Harpole is a 63 y.o. female.  HPI: A 63 year old patient with a history of treated diabetes, hypertension, hypercholesterolemia and obesity presents for evaluation of chest pain. Initial onset of pain was more than 6 hours ago. The patient's chest pain is sharp and is not worse with exertion. The patient complains of nausea and reports some diaphoresis. The patient's chest pain is middle- or left-sided, is not well-localized, is not described as heaviness/pressure/tightness and does radiate to the arms/jaw/neck. The patient has no history of stroke, has no history of peripheral artery disease, has not smoked in the past 90 days and has no relevant family history of coronary artery disease (first degree relative at less than age 63).  Patient states her symptoms started last evening.  She was having sharp pain in her chest and back.  It did radiate to her arms.  She was not able to sleep as it bothered her most of the night.  This morning when she tried to get up she felt very lightheaded.  She felt like the room was spinning.  Patient was initially not able to get up.  She eventually did make it to the bathroom but felt unsteady.  She experienced a couple episodes of vomiting this morning.  She has been having diarrhea recently.  She is also had pain in her upper abdomen.  She denies any fevers or chills.  No cough or shortness of breath.  Patient does have multiple medical problems as listed below.  She states she does have history of heart disease and has multiple stents.  However, upon reviewing the medical records, the patient has had cardiac catheterizations but does not have any stents.  Her last catheterization in July 2019 showed narrowing of the distal  LAD without stenosis. HPI Past Medical History:  Diagnosis Date   Anemia    Anginal pain ([place])    LAST CP TODAY   Anxiety    Asthma    Bipolar disorder ([place])    Blood dyscrasia    Cataract    Chronic bronchitis ([place])    "q other year" (05/14/2018)   Chronic lower back pain    Colon polyp    Complication of anesthesia    "couldn't get me woke when they put me to sleep for my teeth"   Dental caries    periodontitis, lesion left mandible   Depression 11/07   Hospitalization required   Diverticulosis    DVT (deep venous thrombosis) ([place])    "LLE"   Dyspnea    W/ EXERTION       Esophageal stricture    Fibromyalgia    GERD (gastroesophageal reflux disease)    Gout    Headache    "weekly" (08/26/2015)     LITTLE BETTER  09/18/16   Hemorrhoids    History of hiatal hernia    Hyperlipidemia    Hypertension    IBS (irritable bowel syndrome)    Microcytosis    Myocardial infarction ([place])    "slight one years ago"  Not clear where this information comes from--has had multiple stress tests and caths showing no CAD   Obesity    OSA on CPAP    CPAP AT NIGHT W/ O2 PRN  Osteoarthritis    "all over; joints" (05/14/2018)   Pneumonia "several times"   PONV (postoperative nausea and vomiting)    Sickle-cell trait (HCC)    Sleep disorder    Spinal headache    Type II diabetes mellitus ([place])     Patient Active Problem List   Diagnosis Date Noted   Acute coronary syndrome ([place]) 05/14/2018   Chest pain 06/23/2017   Left sided numbness 06/23/2017   Chest pain, precordial 09/18/2016    Class: Acute   Precordial chest pain 09/18/2016   Acute encephalopathy    Hyperglycemia 08/26/2015   Chronic high back pain 08/26/2015   AKI (acute kidney injury) ([place]) 08/26/2015   Prolonged Q-T interval on ECG 08/26/2015   OSA (obstructive sleep apnea) 08/26/2015   Chest pain at rest 03/09/2014   Family history of breast cancer 03/23/2013    Family history of GI tract cancer 03/23/2013   Dyspnea 05/02/2012   Pulmonary infiltrates 05/02/2012   Cough 05/02/2012   Family history of malignant neoplasm of gastrointestinal tract 07/25/2011   Abdominal pain, epigastric 07/25/2011   UNSPECIFIED IRON DEFICIENCY ANEMIA 03/23/2009   Arthritis, degenerative 01/05/2009   HEMORRHOIDS, INTERNAL 10/05/2008   RHINITIS 01/14/2008   HYPOTENSION 11/14/2007   CELLULITIS AND ABSCESS OF TRUNK 11/14/2007   HYPOKALEMIA 10/28/2007   UTI 10/28/2007   DIZZINESS 10/28/2007   Irritable bowel syndrome 08/13/2007   Exophthalmos 05/26/2007   BILIRUBINURIA 05/26/2007   DRUG ABUSE 04/14/2007   SINUSITIS, MAXILLARY, ACUTE 03/24/2007   ANXIETY DISORDER, GENERALIZED 03/04/2007   CALF PAIN 03/04/2007   BENIGN NEOPLASM [place] SITE DIGESTIVE SYSTEM 01/17/2007   ESOPHAGEAL STRICTURE 01/17/2007   Hyperlipidemia 01/14/2007   INSOMNIA 12/03/2006   POLYP, COLON 09/12/2006   Uncontrolled diabetes mellitus ([place]) 09/12/2006   Obesity, Class III, BMI 40-49.9 (morbid obesity) ([place]) 09/12/2006   Asthma 09/12/2006   GERD 09/12/2006   OSTEOARTHRITIS 09/12/2006   MICROCYTOSIS 09/12/2006   HYSTERECTOMY, HX OF 09/12/2006   Essential (primary) hypertension 09/12/2006   Type 2 diabetes mellitus ([place]) 09/12/2006    Past Surgical History:  Procedure Laterality Date   ABDOMINAL HYSTERECTOMY     CARDIAC CATHETERIZATION N/A 09/20/2016   Procedure: Left Heart Cath and Coronary Angiography;  Surgeon: Dixie Dials, MD;  Location: [facility];  Service: Cardiovascular;  Laterality: N/A; Normal coronaries   COLONOSCOPY     DILATION AND CURETTAGE OF UTERUS     ESOPHAGOGASTRODUODENOSCOPY (EGD) WITH ESOPHAGEAL DILATION  "all the time"   ESOPHAGOGASTRODUODENOSCOPY (EGD) WITH ESOPHAGEAL DILATION     "a whole lot of times" (05/14/2018)   EYE SURGERY Bilateral    "had surgery on the sides of my eyes to keep them popping out"    LEFT HEART CATH AND CORONARY ANGIOGRAPHY N/A 05/16/2018   Procedure: LEFT HEART CATH AND CORONARY ANGIOGRAPHY;  Surgeon: Dixie Dials, MD;  Location: [facility];  Service: Cardiovascular;  Laterality: N/A;   MULTIPLE EXTRACTIONS WITH ALVEOLOPLASTY N/A 10/19/2016   Procedure: MULTIPLE EXTRACTION WITH ALVEOLOPLASTY;  Surgeon: Diona Browner, DDS;  Location: [facility];  Service: Oral Surgery;  Laterality: N/A;   MULTIPLE TOOTH EXTRACTIONS  ?1980's   "& cut into my jaw"   TONSILLECTOMY     TUBAL LIGATION     UPPER GASTROINTESTINAL ENDOSCOPY       OB History   No obstetric history on file.      Home Medications    Prior to Admission medications   Medication Sig Start Date End Date Taking? Authorizing Provider  albuterol (PROVENTIL,VENTOLIN) 90 MCG/ACT inhaler Inhale 2 puffs into the lungs every 6 (six) hours as needed for shortness of breath. Wheezing or shortness of breath    [provider]  allopurinol (ZYLOPRIM) 100 MG tablet Take 1 tablet (100 mg total) by mouth daily. 05/20/17   Mack Hook, MD  amLODipine (NORVASC) 10 MG tablet Take 1 tablet (10 mg total) by mouth daily. Patient taking differently: Take 5 mg by mouth daily.  05/20/17   Mack Hook, MD  aspirin 81 MG tablet Take 81 mg by mouth every morning.     [provider]  citalopram (CELEXA) 10 MG tablet Take 1 tablet (10 mg total) by mouth daily. 05/20/17   Mack Hook, MD  COMBIGAN 0.2-0.5 % ophthalmic solution Place 1 drop into both eyes every 12 (twelve) hours. 05/20/17   Mack Hook, MD  diclofenac sodium (VOLTAREN) 1 % GEL Apply 2 g topically 4 (four) times daily as needed (pain). 05/20/17   Mack Hook, MD  docusate sodium (COLACE) 100 MG capsule Take 1 capsule (100 mg total) by mouth daily. 05/20/17   Mack Hook, MD  ergocalciferol (VITAMIN D2) 50000 units capsule Take 1 capsule (50,000 Units total) by mouth once a week. Usually takes on Saturday or  fridays 05/20/17   Mack Hook, MD  fenofibrate (TRICOR) 145 MG tablet Take 1 tablet (145 mg total) by mouth daily. 05/20/17   Mack Hook, MD  ferrous sulfate 325 (65 FE) MG tablet Take 1 tablet (325 mg total) by mouth daily with breakfast. 05/17/18   Dixie Dials, MD  fluticasone (FLONASE) 50 MCG/ACT nasal spray 2 sprays each nostril once daily Patient taking differently: Place 2 sprays into both nostrils daily.  05/20/17   Mack Hook, MD  fluticasone-salmeterol (ADVAIR HFA) [phone] MCG/ACT inhaler Inhale 2 puffs into the lungs 2 (two) times daily. 05/20/17   Mack Hook, MD  hydrochlorothiazide (HYDRODIURIL) 25 MG tablet Take 25 mg by mouth daily. 05/01/18   [provider]  HYDROcodone-acetaminophen (NORCO) 10-325 MG tablet Take 1 tablet by mouth 5 (five) times daily as needed for pain. 03/07/18   [provider]  insulin NPH-regular Human (NOVOLIN 70/30) (70-30) 100 UNIT/ML injection Inject 60 units BID Patient taking differently: Inject 60 Units into the skin 2 (two) times daily with a meal.  05/20/17   Mack Hook, MD  ipratropium (ATROVENT) 0.02 % nebulizer solution Take 2.5 mLs (0.5 mg total) by nebulization 3 (three) times daily. Nebulize 0.5 mg three times daily 05/20/17   Mack Hook, MD  ipratropium (ATROVENT) 0.03 % nasal spray Place 2 sprays into both nostrils every 12 (twelve) hours. 05/20/17   Mack Hook, MD  Lancets MISC Check sugars twice daily before meals 05/20/17   Mack Hook, MD  Latanoprostene Bunod (VYZULTA) 0.024 % SOLN Apply 1 drop to eye 2 (two) times daily.     [provider]  loratadine (CLARITIN) 10 MG tablet Take 1 tablet (10 mg total) by mouth every morning. 05/20/17   Mack Hook, MD  Magnesium Oxide -Mg Supplement 250 MG TABS Take 1 tablet by mouth daily. 04/14/18   [provider]  metFORMIN (GLUCOPHAGE) 500 MG tablet Take 1 tablet (500 mg total) by mouth 2 (two) times  daily with a meal. No metformin for 2 days then resume. 05/17/18   Dixie Dials, MD  metoCLOPramide (REGLAN) 10 MG tablet Take 10 mg by mouth 2 (two) times daily. 04/28/18   [provider]  metoprolol tartrate (LOPRESSOR) 50 MG tablet take  1 tablet by mouth twice a day 07/05/17   Mack Hook, MD  nitroGLYCERIN (NITROSTAT) 0.4 MG SL tablet Place 0.4 mg under the tongue every 5 (five) minutes as needed for chest pain.  09/07/16   [provider]  omeprazole (PRILOSEC) 40 MG capsule Take 40 mg by mouth daily. 04/07/18   [provider]  polyethylene glycol (MIRALAX / GLYCOLAX) packet Take 17 g by mouth daily. Patient taking differently: Take 17 g by mouth daily as needed for mild constipation.  05/20/17   Mack Hook, MD  potassium chloride (K-DUR) 10 MEQ tablet Take 1 tablet (10 mEq total) by mouth 2 (two) times daily. 05/17/18   Dixie Dials, MD  promethazine (PHENERGAN) 25 MG tablet take 1/2 tablet by mouth every 6 hours if needed for nausea and vomiting Patient taking differently: take 25mg  by mouth every 6 hours if needed for nausea and vomiting 07/22/17   Mack Hook, MD  RESTASIS 0.05 % ophthalmic emulsion Place 1 drop into both eyes 2 (two) times daily.  09/10/16   [provider]  traZODone (DESYREL) 100 MG tablet Take 100 mg by mouth at bedtime. 02/05/18   [provider]    Family History Family History  Problem Relation Age of Onset   Rectal cancer Sister    Lung cancer Father    Esophageal cancer Father    Breast cancer Sister 24   Breast cancer Mother 59   Colon cancer Other        72, Uncle   Liver cancer Brother        Nephew   Diabetes Sister    Diabetes Brother    Diabetes Other        son & daughter   Heart disease Sister    Heart disease Brother    Kidney disease Sister    Breast cancer Maternal Aunt        patient reports 70 mat aunts with breast cancer   Breast cancer Paternal Aunt 36     Breast cancer Maternal Grandmother        unk age    Social History Social History   Tobacco Use   Smoking status: Never Smoker   Smokeless tobacco: Never Used  Substance Use Topics   Alcohol use: No   Drug use: Yes    Types: Hydrocodone     Allergies   Suboxone [buprenorphine hcl-naloxone hcl], Ace inhibitors, Latex, and Percocet [oxycodone-acetaminophen]   Review of Systems Review of Systems   Physical Exam Updated Vital Signs BP (!) 164/97    Pulse 81    Temp 97.9 F (36.6 C) (Oral)    Resp 17    Ht 1.702 m (5\' 7" )    Wt (!) 142.9 kg    SpO2 100%    BMI 49.34 kg/m   Physical Exam Vitals signs and nursing note reviewed.  Constitutional:      General: She is not in acute distress.    Appearance: She is well-developed. She is obese.  HENT:     Head: Normocephalic and atraumatic.     Right Ear: External ear normal.     Left Ear: External ear normal.  Eyes:     General: No scleral icterus.       Right eye: No discharge.        Left eye: No discharge.     Conjunctiva/sclera: Conjunctivae normal.  Neck:     Musculoskeletal: Neck supple.     Trachea: No tracheal deviation.  Cardiovascular:     Rate and Rhythm: Normal rate and regular rhythm.  Pulmonary:     Effort: Pulmonary effort is normal. No respiratory distress.     Breath sounds: Normal breath sounds. No stridor. No wheezing or rales.  Abdominal:     General: Bowel sounds are normal. There is no distension.     Palpations: Abdomen is soft.     Tenderness: There is abdominal tenderness. There is no guarding or rebound.     Comments: Mild epigastric region  Musculoskeletal:        General: No tenderness.  Skin:    General: Skin is warm and dry.     Findings: No rash.  Neurological:     Mental Status: She is alert and oriented to person, place, and time.     Cranial Nerves: No cranial nerve deficit (No facial droop, extraocular movements intact, tongue midline ).     Sensory: No sensory deficit.      Motor: No abnormal muscle tone or seizure activity.     Coordination: Coordination normal.     Comments: No pronator drift bilateral upper extrem, able to hold both legs off bed for 5 seconds, sensation intact in all extremities, no visual field cuts, no left or right sided neglect, normal finger-nose exam bilaterally, no nystagmus noted       ED Treatments / Results  Labs (all labs ordered are listed, but only abnormal results are displayed) Labs Reviewed  CBC - Abnormal; Notable for the following components:      Result Value   RBC 5.14 (*)    Hemoglobin 10.7 (*)    HCT 35.3 (*)    MCV 68.7 (*)    MCH 20.8 (*)    RDW 19.2 (*)    All other components within normal limits  COMPREHENSIVE METABOLIC PANEL - Abnormal; Notable for the following components:   Potassium 3.0 (*)    Chloride 94 (*)    Glucose, Bld 296 (*)    Calcium 8.5 (*)    All other components within normal limits  URINALYSIS, ROUTINE W REFLEX MICROSCOPIC - Abnormal; Notable for the following components:   Ketones, ur 5 (*)    All other components within normal limits  CBG MONITORING, ED - Abnormal; Notable for the following components:   Glucose-Capillary 276 (*)    All other components within normal limits  SARS CORONAVIRUS 2 (HOSPITAL ORDER, [facility])  TROPONIN I (HIGH SENSITIVITY)  TROPONIN I (HIGH SENSITIVITY)  LIPASE, BLOOD  PROTIME-INR    EKG EKG Interpretation  Date/Time:  Tuesday May 05 2019 09:15:00 EDT Ventricular Rate:  76 PR Interval:    QRS Duration: 98 QT Interval:  458 QTC Calculation: 515 R Axis:   -6 Text Interpretation:  Sinus rhythm Ventricular premature complex , new since last tracing Abnormal R-wave progression, early transition Nonspecific T abnormalities, lateral leads Prolonged QT interval , new since last tracing Confirmed by Dorie Rank [phone]) on 05/05/2019 9:21:38 AM   Radiology Ct Head Wo Contrast  Result Date: 05/05/2019 CLINICAL DATA:   Ataxia EXAM: CT HEAD WITHOUT CONTRAST TECHNIQUE: Contiguous axial images were obtained from the base of the skull through the vertex without intravenous contrast. COMPARISON:  Head CT June 22, 2017 and brain MRI June 23, 2017 FINDINGS: Brain: The ventricles are normal in size and configuration. There is no intracranial mass, hemorrhage, extra-axial fluid collection, or midline shift. The brain parenchyma appears unremarkable. No acute infarct is demonstrable. Vascular: There  is no hyperdense vessel. No vascular calcifications are evident. Skull: The bony calvarium appears intact. Sinuses/Orbits: There is a retention cyst in the right maxillary antrum. A much smaller retention cyst is noted in the anterior left maxillary antrum. Other paranasal sinuses are clear. There is again noted a degree of orbital proptosis. Orbits appear symmetric bilaterally. Other: Mastoid air cells are clear. IMPRESSION: Unremarkable appearing brain parenchyma. No mass or hemorrhage. No evident acute infarct. Stable symmetric orbital proptosis. Retention cysts in the maxillary antra. Electronically Signed   By: Lowella Grip III M.D.   On: 05/05/2019 10:38   Dg Chest Portable 1 View  Result Date: 05/05/2019 CLINICAL DATA:  LEFT chest pain into LEFT arm, shortness of breath, dizziness, nausea, vomiting, and diarrhea for 1 week, history diabetes mellitus, hypertension, asthma, prior MI EXAM: PORTABLE CHEST 1 VIEW COMPARISON:  Portable exam 0935 hours compared to 06/22/2017 FINDINGS: Enlargement of cardiac silhouette. Mediastinal contours and pulmonary vascularity normal. Artifacts traverse lower chest. No definite infiltrate, pleural effusion or pneumothorax. Bones unremarkable. IMPRESSION: Enlargement of cardiac silhouette. No acute abnormalities. Electronically Signed   By: Lavonia Dana M.D.   On: 05/05/2019 10:02    Procedures Procedures (including critical care time)  Medications Ordered in ED Medications  aspirin  chewable tablet 324 mg (324 mg Oral Given 05/05/19 1113)  potassium chloride SA (K-DUR) CR tablet 40 mEq (40 mEq Oral Given 05/05/19 1250)     Initial Impression / Assessment and Plan / ED Course  I have reviewed the triage vital signs and the nursing notes.  Pertinent labs & imaging results that were available during my care of the patient were reviewed by me and considered in my medical decision making (see chart for details).  Clinical Course as of May 05 1331  Tue May 05, 2019  1322 Laboratory work-up is reassuring.   [JK]  1323 Anemia is stable.  Hyperglycemia but no signs of any acute complications.  Serial troponins are negative.   [JK]    Clinical Course User Index [JK] Dorie Rank, MD    HEAR Score: 5  Patient presented with several complaints.  She was having chest pain, dizziness, abdominal pain, vomiting and diarrhea.   Patient's evaluation is reassuring her symptoms are not typical for coronary artery disease.  She had serial troponins that are normal.  I reviewed her old records and the patient has also had a reassuring cardiac cath in the last year.  I think she is stable for outpatient follow-up regarding this issue.  Patient has complained of some dizziness.  She does not have any focal neurologic deficits.  CT scan is negative.  I doubt stroke or any acute abnormality.  Patient is not having any vomiting or diarrhea here in the ED.  Is possible this may be a viral illness and she was dehydrated.  Did recommend she monitor her blood sugar.  Outpatient follow-up with her primary care doctor.  Warning signs or precautions discussed.  Final Clinical Impressions(s) / ED Diagnoses   Final diagnoses:  Hyperglycemia  Chest pain, unspecified type  Dizziness    ED Discharge Orders    None       Dorie Rank, MD 05/05/19 [phone]

## 2019-05-05 NOTE — ED Notes (Signed)
Patient transported to CT 

## 2019-08-05 ENCOUNTER — Other Ambulatory Visit (HOSPITAL_COMMUNITY): Payer: Self-pay | Admitting: Geriatric Medicine

## 2019-08-05 ENCOUNTER — Other Ambulatory Visit: Payer: Self-pay | Admitting: Geriatric Medicine

## 2019-08-05 DIAGNOSIS — K219 Gastro-esophageal reflux disease without esophagitis: Secondary | ICD-10-CM

## 2019-08-14 ENCOUNTER — Ambulatory Visit (HOSPITAL_COMMUNITY)
Admission: RE | Admit: 2019-08-14 | Discharge: 2019-08-14 | Disposition: A | Discharge status: Home / Self Care | Payer: Medicaid Other | Source: Ambulatory Visit | Attending: Geriatric Medicine | Admitting: Geriatric Medicine

## 2019-08-14 ENCOUNTER — Other Ambulatory Visit: Payer: Self-pay

## 2019-08-14 DIAGNOSIS — K219 Gastro-esophageal reflux disease without esophagitis: Secondary | ICD-10-CM | POA: Diagnosis not present

## 2019-11-05 ENCOUNTER — Other Ambulatory Visit: Payer: Self-pay

## 2019-11-05 DIAGNOSIS — Z20822 Contact with and (suspected) exposure to covid-19: Secondary | ICD-10-CM

## 2019-11-07 LAB — NOVEL CORONAVIRUS, NAA: SARS-CoV-2, NAA: NOT DETECTED

## 2020-01-29 ENCOUNTER — Ambulatory Visit: Payer: Medicaid Other | Attending: Internal Medicine

## 2020-02-01 ENCOUNTER — Ambulatory Visit: Payer: Medicaid Other

## 2020-03-28 ENCOUNTER — Other Ambulatory Visit: Payer: Self-pay | Admitting: Internal Medicine

## 2020-03-28 ENCOUNTER — Other Ambulatory Visit: Payer: Self-pay | Admitting: Cardiovascular Disease

## 2020-03-28 DIAGNOSIS — R072 Precordial pain: Secondary | ICD-10-CM

## 2020-04-06 ENCOUNTER — Other Ambulatory Visit: Payer: Self-pay

## 2020-04-06 ENCOUNTER — Encounter (HOSPITAL_COMMUNITY)
Admission: RE | Admit: 2020-04-06 | Discharge: 2020-04-06 | Disposition: A | Discharge status: Home / Self Care | Payer: Medicaid Other | Source: Ambulatory Visit | Attending: Cardiovascular Disease | Admitting: Cardiovascular Disease

## 2020-04-06 DIAGNOSIS — R072 Precordial pain: Secondary | ICD-10-CM | POA: Diagnosis not present

## 2020-04-06 MED ORDER — TECHNETIUM TC 99M TETROFOSMIN IV KIT
28.6000 | PACK | Freq: Once | INTRAVENOUS | Status: AC | PRN
  Administered 2020-04-06: 28.6 via INTRAVENOUS

## 2020-04-06 MED ORDER — REGADENOSON 0.4 MG/5ML IV SOLN
0.4000 mg | Freq: Once | INTRAVENOUS | Status: AC
  Administered 2020-04-06: 0.4 mg via INTRAVENOUS

## 2020-04-06 MED ORDER — REGADENOSON 0.4 MG/5ML IV SOLN
INTRAVENOUS | Status: AC
  Filled 2020-04-06: qty 5

## 2020-04-07 ENCOUNTER — Encounter (HOSPITAL_COMMUNITY)
Admission: RE | Admit: 2020-04-07 | Discharge: 2020-04-07 | Disposition: A | Discharge status: Home / Self Care | Payer: Medicaid Other | Source: Ambulatory Visit | Attending: Cardiovascular Disease | Admitting: Cardiovascular Disease

## 2020-04-07 MED ORDER — TECHNETIUM TC 99M TETROFOSMIN IV KIT
32.1000 | PACK | Freq: Once | INTRAVENOUS | Status: AC | PRN
  Administered 2020-04-07: 32.1 via INTRAVENOUS

## 2020-11-07 ENCOUNTER — Observation Stay (HOSPITAL_COMMUNITY)
Admission: EM | Admit: 2020-11-07 | Discharge: 2020-11-09 | Disposition: A | Discharge status: Home / Self Care | Payer: Medicare Other | Attending: Internal Medicine | Admitting: Internal Medicine

## 2020-11-07 ENCOUNTER — Other Ambulatory Visit: Payer: Self-pay

## 2020-11-07 DIAGNOSIS — Z9861 Coronary angioplasty status: Secondary | ICD-10-CM | POA: Insufficient documentation

## 2020-11-07 DIAGNOSIS — E1100 Type 2 diabetes mellitus with hyperosmolarity without nonketotic hyperglycemic-hyperosmolar coma (NKHHC): Principal | ICD-10-CM | POA: Diagnosis present

## 2020-11-07 DIAGNOSIS — Z7984 Long term (current) use of oral hypoglycemic drugs: Secondary | ICD-10-CM | POA: Diagnosis not present

## 2020-11-07 DIAGNOSIS — Z7982 Long term (current) use of aspirin: Secondary | ICD-10-CM | POA: Insufficient documentation

## 2020-11-07 DIAGNOSIS — R739 Hyperglycemia, unspecified: Secondary | ICD-10-CM

## 2020-11-07 DIAGNOSIS — Z79899 Other long term (current) drug therapy: Secondary | ICD-10-CM | POA: Diagnosis not present

## 2020-11-07 DIAGNOSIS — N179 Acute kidney failure, unspecified: Secondary | ICD-10-CM | POA: Diagnosis not present

## 2020-11-07 DIAGNOSIS — Z91040 Latex allergy status: Secondary | ICD-10-CM | POA: Diagnosis not present

## 2020-11-07 DIAGNOSIS — J45909 Unspecified asthma, uncomplicated: Secondary | ICD-10-CM | POA: Insufficient documentation

## 2020-11-07 DIAGNOSIS — E039 Hypothyroidism, unspecified: Secondary | ICD-10-CM | POA: Diagnosis not present

## 2020-11-07 DIAGNOSIS — Z20822 Contact with and (suspected) exposure to covid-19: Secondary | ICD-10-CM | POA: Insufficient documentation

## 2020-11-07 DIAGNOSIS — G4733 Obstructive sleep apnea (adult) (pediatric): Secondary | ICD-10-CM | POA: Diagnosis present

## 2020-11-07 DIAGNOSIS — Z794 Long term (current) use of insulin: Secondary | ICD-10-CM | POA: Diagnosis not present

## 2020-11-07 DIAGNOSIS — K219 Gastro-esophageal reflux disease without esophagitis: Secondary | ICD-10-CM | POA: Diagnosis present

## 2020-11-07 DIAGNOSIS — I10 Essential (primary) hypertension: Secondary | ICD-10-CM | POA: Diagnosis not present

## 2020-11-07 DIAGNOSIS — E1165 Type 2 diabetes mellitus with hyperglycemia: Secondary | ICD-10-CM | POA: Diagnosis present

## 2020-11-07 LAB — CBG MONITORING, ED
Glucose-Capillary: 570 mg/dL (ref 70–99)
Glucose-Capillary: 483 mg/dL — ABNORMAL HIGH (ref 70–99)

## 2020-11-07 LAB — BASIC METABOLIC PANEL
Anion gap: 19 — ABNORMAL HIGH (ref 5–15)
BUN: 21 mg/dL (ref 8–23)
CO2: 23 mmol/L (ref 22–32)
Calcium: 9.4 mg/dL (ref 8.9–10.3)
Chloride: 89 mmol/L — ABNORMAL LOW (ref 98–111)
Creatinine, Ser: 1.76 mg/dL — ABNORMAL HIGH (ref 0.44–1.00)
GFR, Estimated: 32 mL/min — ABNORMAL LOW (ref >60)
Glucose, Bld: 545 mg/dL (ref 70–99)
Potassium: 3.6 mmol/L (ref 3.5–5.1)
Sodium: 131 mmol/L — ABNORMAL LOW (ref 135–145)

## 2020-11-07 LAB — CBC
HCT: 39 % (ref 36.0–46.0)
Hemoglobin: 12.1 g/dL (ref 12.0–15.0)
MCH: 23.3 pg — ABNORMAL LOW (ref 26.0–34.0)
MCHC: 31 g/dL (ref 30.0–36.0)
MCV: 75 fL — ABNORMAL LOW (ref 80.0–100.0)
Platelets: 314 10*3/uL (ref 150–400)
RBC: 5.2 MIL/uL — ABNORMAL HIGH (ref 3.87–5.11)
RDW: 17.1 % — ABNORMAL HIGH (ref 11.5–15.5)
WBC: 6.8 10*3/uL (ref 4.0–10.5)
nRBC: 0 % (ref 0.0–0.2)

## 2020-11-07 NOTE — ED Triage Notes (Signed)
Pt with hx of diabetes reports hyperglycemia for several days. Has not checked CBG in two days because she has been feeling poorly. Reports needing two kinds of insulin but only has rx for one.

## 2020-11-07 NOTE — Progress Notes (Addendum)
Inpatient Diabetes Program Recommendations  AACE/ADA: New Consensus Statement on Inpatient Glycemic Control (2015)  Target Ranges:  Prepandial:   less than 140 mg/dL      Peak postprandial:   less than 180 mg/dL (1-2 hours)      Critically ill patients:  140 - 180 mg/dL   Lab Results  Component Value Date   GLUCAP 570 (HH) 11/07/2020   HGBA1C 12.7 (H) 05/14/2018    Review of Glycemic Control  Diabetes history: DM2 Outpatient Diabetes medications: Novolin NPH 70/30 60 units bid Current orders for Inpatient glycemic control: None at present  HgbA1C - 12.7%  CBG 570 mg/dL  Inpatient Diabetes Program Recommendations:     IV insulin per [facility] for DKA.  Need updated HgbA1C. Last one on 05/14/18 - 12.7%.  Thank you. Ailene Ards, RD, LDN, CDE Inpatient Diabetes Coordinator [phone]

## 2020-11-07 NOTE — ED Notes (Signed)
CBG 570 

## 2020-11-08 ENCOUNTER — Observation Stay (HOSPITAL_COMMUNITY): Payer: Medicare Other

## 2020-11-08 ENCOUNTER — Encounter (HOSPITAL_COMMUNITY): Payer: Self-pay | Admitting: Internal Medicine

## 2020-11-08 DIAGNOSIS — N179 Acute kidney failure, unspecified: Secondary | ICD-10-CM

## 2020-11-08 DIAGNOSIS — E1100 Type 2 diabetes mellitus with hyperosmolarity without nonketotic hyperglycemic-hyperosmolar coma (NKHHC): Secondary | ICD-10-CM | POA: Diagnosis present

## 2020-11-08 DIAGNOSIS — E039 Hypothyroidism, unspecified: Secondary | ICD-10-CM | POA: Diagnosis present

## 2020-11-08 DIAGNOSIS — K219 Gastro-esophageal reflux disease without esophagitis: Secondary | ICD-10-CM | POA: Diagnosis not present

## 2020-11-08 LAB — CBG MONITORING, ED
Glucose-Capillary: 535 mg/dL (ref 70–99)
Glucose-Capillary: 542 mg/dL (ref 70–99)
Glucose-Capillary: 557 mg/dL (ref 70–99)
Glucose-Capillary: 581 mg/dL (ref 70–99)
Glucose-Capillary: 497 mg/dL — ABNORMAL HIGH (ref 70–99)
Glucose-Capillary: 462 mg/dL — ABNORMAL HIGH (ref 70–99)
Glucose-Capillary: 425 mg/dL — ABNORMAL HIGH (ref 70–99)
Glucose-Capillary: 385 mg/dL — ABNORMAL HIGH (ref 70–99)
Glucose-Capillary: 359 mg/dL — ABNORMAL HIGH (ref 70–99)
Glucose-Capillary: 319 mg/dL — ABNORMAL HIGH (ref 70–99)
Glucose-Capillary: 303 mg/dL — ABNORMAL HIGH (ref 70–99)
Glucose-Capillary: 282 mg/dL — ABNORMAL HIGH (ref 70–99)
Glucose-Capillary: 354 mg/dL — ABNORMAL HIGH (ref 70–99)
Glucose-Capillary: 379 mg/dL — ABNORMAL HIGH (ref 70–99)
Glucose-Capillary: 282 mg/dL — ABNORMAL HIGH (ref 70–99)

## 2020-11-08 LAB — BASIC METABOLIC PANEL
Anion gap: 14 (ref 5–15)
Anion gap: 17 — ABNORMAL HIGH (ref 5–15)
Anion gap: 11 (ref 5–15)
Anion gap: 10 (ref 5–15)
BUN: 27 mg/dL — ABNORMAL HIGH (ref 8–23)
BUN: 27 mg/dL — ABNORMAL HIGH (ref 8–23)
BUN: 22 mg/dL (ref 8–23)
BUN: 21 mg/dL (ref 8–23)
CO2: 27 mmol/L (ref 22–32)
CO2: 23 mmol/L (ref 22–32)
CO2: 27 mmol/L (ref 22–32)
CO2: 28 mmol/L (ref 22–32)
Calcium: 8.6 mg/dL — ABNORMAL LOW (ref 8.9–10.3)
Calcium: 8.6 mg/dL — ABNORMAL LOW (ref 8.9–10.3)
Calcium: 8.8 mg/dL — ABNORMAL LOW (ref 8.9–10.3)
Calcium: 8.2 mg/dL — ABNORMAL LOW (ref 8.9–10.3)
Chloride: 89 mmol/L — ABNORMAL LOW (ref 98–111)
Chloride: 93 mmol/L — ABNORMAL LOW (ref 98–111)
Chloride: 96 mmol/L — ABNORMAL LOW (ref 98–111)
Chloride: 97 mmol/L — ABNORMAL LOW (ref 98–111)
Creatinine, Ser: 1.92 mg/dL — ABNORMAL HIGH (ref 0.44–1.00)
Creatinine, Ser: 1.79 mg/dL — ABNORMAL HIGH (ref 0.44–1.00)
Creatinine, Ser: 1.5 mg/dL — ABNORMAL HIGH (ref 0.44–1.00)
Creatinine, Ser: 1.44 mg/dL — ABNORMAL HIGH (ref 0.44–1.00)
GFR, Estimated: 29 mL/min — ABNORMAL LOW (ref >60)
GFR, Estimated: 31 mL/min — ABNORMAL LOW (ref >60)
GFR, Estimated: 39 mL/min — ABNORMAL LOW (ref >60)
GFR, Estimated: 41 mL/min — ABNORMAL LOW (ref >60)
Glucose, Bld: 546 mg/dL (ref 70–99)
Glucose, Bld: 488 mg/dL — ABNORMAL HIGH (ref 70–99)
Glucose, Bld: 326 mg/dL — ABNORMAL HIGH (ref 70–99)
Glucose, Bld: 349 mg/dL — ABNORMAL HIGH (ref 70–99)
Potassium: 3.8 mmol/L (ref 3.5–5.1)
Potassium: 3.3 mmol/L — ABNORMAL LOW (ref 3.5–5.1)
Potassium: 3.2 mmol/L — ABNORMAL LOW (ref 3.5–5.1)
Potassium: 3.4 mmol/L — ABNORMAL LOW (ref 3.5–5.1)
Sodium: 130 mmol/L — ABNORMAL LOW (ref 135–145)
Sodium: 133 mmol/L — ABNORMAL LOW (ref 135–145)
Sodium: 134 mmol/L — ABNORMAL LOW (ref 135–145)
Sodium: 135 mmol/L (ref 135–145)

## 2020-11-08 LAB — RESP PANEL BY RT-PCR (FLU A&B, COVID) ARPGX2
Influenza A by PCR: NEGATIVE
Influenza B by PCR: NEGATIVE
SARS Coronavirus 2 by RT PCR: NEGATIVE

## 2020-11-08 LAB — URINALYSIS, ROUTINE W REFLEX MICROSCOPIC
Bacteria, UA: NONE SEEN
Bilirubin Urine: NEGATIVE
Glucose, UA: >=500 mg/dL — AB
Hgb urine dipstick: NEGATIVE
Ketones, ur: 5 mg/dL — AB
Nitrite: NEGATIVE
Protein, ur: NEGATIVE mg/dL
Specific Gravity, Urine: 1.026 (ref 1.005–1.030)
pH: 5 (ref 5.0–8.0)

## 2020-11-08 LAB — CBC WITH DIFFERENTIAL/PLATELET
Abs Immature Granulocytes: 0.03 10*3/uL (ref 0.00–0.07)
Basophils Absolute: 0 10*3/uL (ref 0.0–0.1)
Basophils Relative: 0 %
Eosinophils Absolute: 0.1 10*3/uL (ref 0.0–0.5)
Eosinophils Relative: 1 %
HCT: 34.2 % — ABNORMAL LOW (ref 36.0–46.0)
Hemoglobin: 11.1 g/dL — ABNORMAL LOW (ref 12.0–15.0)
Immature Granulocytes: 0 %
Lymphocytes Relative: 15 %
Lymphs Abs: 1.6 10*3/uL (ref 0.7–4.0)
MCH: 23.7 pg — ABNORMAL LOW (ref 26.0–34.0)
MCHC: 32.5 g/dL (ref 30.0–36.0)
MCV: 72.9 fL — ABNORMAL LOW (ref 80.0–100.0)
Monocytes Absolute: 0.4 10*3/uL (ref 0.1–1.0)
Monocytes Relative: 4 %
Neutro Abs: 8.5 10*3/uL — ABNORMAL HIGH (ref 1.7–7.7)
Neutrophils Relative %: 80 %
Platelets: 293 10*3/uL (ref 150–400)
RBC: 4.69 MIL/uL (ref 3.87–5.11)
RDW: 17.2 % — ABNORMAL HIGH (ref 11.5–15.5)
WBC: 10.6 10*3/uL — ABNORMAL HIGH (ref 4.0–10.5)
nRBC: 0 % (ref 0.0–0.2)

## 2020-11-08 LAB — I-STAT VENOUS BLOOD GAS, ED
Acid-Base Excess: 5 mmol/L — ABNORMAL HIGH (ref 0.0–2.0)
Bicarbonate: 31.2 mmol/L — ABNORMAL HIGH (ref 20.0–28.0)
Calcium, Ion: 1.12 mmol/L — ABNORMAL LOW (ref 1.15–1.40)
HCT: 37 % (ref 36.0–46.0)
Hemoglobin: 12.6 g/dL (ref 12.0–15.0)
O2 Saturation: 79 %
Potassium: 3.8 mmol/L (ref 3.5–5.1)
Sodium: 133 mmol/L — ABNORMAL LOW (ref 135–145)
TCO2: 33 mmol/L — ABNORMAL HIGH (ref 22–32)
pCO2, Ven: 53.1 mmHg (ref 44.0–60.0)
pH, Ven: 7.376 (ref 7.250–7.430)
pO2, Ven: 46 mmHg — ABNORMAL HIGH (ref 32.0–45.0)

## 2020-11-08 LAB — HIV ANTIBODY (ROUTINE TESTING W REFLEX): HIV Screen 4th Generation wRfx: NONREACTIVE

## 2020-11-08 LAB — LIPASE, BLOOD: Lipase: 19 U/L (ref 11–51)

## 2020-11-08 LAB — TSH: TSH: 0.598 u[IU]/mL (ref 0.350–4.500)

## 2020-11-08 LAB — OSMOLALITY: Osmolality: 299 mOsm/kg — ABNORMAL HIGH (ref 275–295)

## 2020-11-08 LAB — BETA-HYDROXYBUTYRIC ACID: Beta-Hydroxybutyric Acid: 4.25 mmol/L — ABNORMAL HIGH (ref 0.05–0.27)

## 2020-11-08 LAB — HEMOGLOBIN A1C
Hgb A1c MFr Bld: 11.1 % — ABNORMAL HIGH (ref 4.8–5.6)
Mean Plasma Glucose: 271.87 mg/dL

## 2020-11-08 LAB — GLUCOSE, CAPILLARY
Glucose-Capillary: 267 mg/dL — ABNORMAL HIGH (ref 70–99)
Glucose-Capillary: 180 mg/dL — ABNORMAL HIGH (ref 70–99)

## 2020-11-08 MED ORDER — INSULIN ASPART 100 UNIT/ML ~~LOC~~ SOLN
10.0000 [IU] | Freq: Once | SUBCUTANEOUS | Status: AC
  Administered 2020-11-08: 10 [IU] via INTRAVENOUS

## 2020-11-08 MED ORDER — ACETAMINOPHEN 325 MG PO TABS
650.0000 mg | ORAL_TABLET | ORAL | Status: DC | PRN
  Administered 2020-11-08: 650 mg via ORAL
  Filled 2020-11-08: qty 2

## 2020-11-08 MED ORDER — LACTATED RINGERS IV BOLUS: 20.0000 mL/kg | Freq: Once | INTRAVENOUS | Status: AC

## 2020-11-08 MED ORDER — LACTATED RINGERS IV BOLUS
1000.0000 mL | Freq: Once | INTRAVENOUS | Status: AC
  Administered 2020-11-08: 1000 mL via INTRAVENOUS

## 2020-11-08 MED ORDER — MELATONIN 5 MG PO TABS
10.0000 mg | ORAL_TABLET | Freq: Every evening | ORAL | Status: DC | PRN
  Filled 2020-11-08: qty 2

## 2020-11-08 MED ORDER — INSULIN ASPART 100 UNIT/ML ~~LOC~~ SOLN: 0.0000 [IU] | Freq: Every day | SUBCUTANEOUS | Status: DC

## 2020-11-08 MED ORDER — LACTATED RINGERS IV SOLN: INTRAVENOUS | Status: DC

## 2020-11-08 MED ORDER — PROCHLORPERAZINE EDISYLATE 10 MG/2ML IJ SOLN
10.0000 mg | Freq: Four times a day (QID) | INTRAMUSCULAR | Status: DC | PRN
  Filled 2020-11-08: qty 2

## 2020-11-08 MED ORDER — DEXTROSE 50 % IV SOLN: 0.0000 mL | INTRAVENOUS | Status: DC | PRN

## 2020-11-08 MED ORDER — INSULIN ASPART PROT & ASPART (70-30 MIX) 100 UNIT/ML ~~LOC~~ SUSP
60.0000 [IU] | Freq: Two times a day (BID) | SUBCUTANEOUS | Status: DC
  Administered 2020-11-08 – 2020-11-09 (×2): 60 [IU] via SUBCUTANEOUS
  Filled 2020-11-08 (×2): qty 10

## 2020-11-08 MED ORDER — ENOXAPARIN SODIUM 40 MG/0.4ML ~~LOC~~ SOLN
40.0000 mg | Freq: Every day | SUBCUTANEOUS | Status: DC
  Administered 2020-11-08 – 2020-11-09 (×2): 40 mg via SUBCUTANEOUS
  Filled 2020-11-08 (×2): qty 0.4

## 2020-11-08 MED ORDER — DEXTROSE IN LACTATED RINGERS 5 % IV SOLN: INTRAVENOUS | Status: DC

## 2020-11-08 MED ORDER — POTASSIUM CHLORIDE 10 MEQ/100ML IV SOLN
10.0000 meq | INTRAVENOUS | Status: AC
  Filled 2020-11-08: qty 100

## 2020-11-08 MED ORDER — INSULIN REGULAR(HUMAN) IN NACL 100-0.9 UT/100ML-% IV SOLN
INTRAVENOUS | Status: DC
  Administered 2020-11-08: 5 [IU]/h via INTRAVENOUS
  Filled 2020-11-08: qty 100

## 2020-11-08 MED ORDER — POTASSIUM CHLORIDE CRYS ER 20 MEQ PO TBCR: 40.0000 meq | EXTENDED_RELEASE_TABLET | Freq: Once | ORAL | Status: DC

## 2020-11-08 MED ORDER — INSULIN ASPART 100 UNIT/ML ~~LOC~~ SOLN
0.0000 [IU] | Freq: Three times a day (TID) | SUBCUTANEOUS | Status: DC
  Administered 2020-11-09: 15 [IU] via SUBCUTANEOUS

## 2020-11-08 MED ORDER — ALBUTEROL SULFATE HFA 108 (90 BASE) MCG/ACT IN AERS: 2.0000 | INHALATION_SPRAY | RESPIRATORY_TRACT | Status: DC | PRN

## 2020-11-08 NOTE — Progress Notes (Signed)
This is a no charge note.  Please see details by my partner Dr. Leafy Half from earlier today.  Patient admitted with partially ketotic hyperglycemic crisis.  Gap closed a while ago, but sugars very challenging to manage even on drip.  Continue drip until this evening, will transition to her home 70/30 tonight with anticipation of going home tomorrow.

## 2020-11-08 NOTE — Progress Notes (Signed)
Patient arrived to room in NAD, VS stable and patient free from pain. Patient oriented to room. Belongings at bedside include, clothes, boots, can and cell phone and charger.

## 2020-11-08 NOTE — ED Notes (Signed)
Lunch Tray Ordered @ 1034. 

## 2020-11-08 NOTE — ED Notes (Signed)
Maintained rate of insulin at 5.5units/hr per endotool. Next bg check [phone]

## 2020-11-08 NOTE — ED Notes (Signed)
Insulin gtt titrated per endotool

## 2020-11-08 NOTE — ED Notes (Signed)
Blood resent to lab

## 2020-11-08 NOTE — ED Notes (Signed)
First contact. Change of shift. Pt resting in bed. AFVSS. Awaiting BMP from lab

## 2020-11-08 NOTE — Progress Notes (Signed)
Inpatient Diabetes Program Recommendations  AACE/ADA: New Consensus Statement on Inpatient Glycemic Control (2015)  Target Ranges:  Prepandial:   less than 140 mg/dL      Peak postprandial:   less than 180 mg/dL (1-2 hours)      Critically ill patients:  140 - 180 mg/dL   Lab Results  Component Value Date   GLUCAP 379 (H) 11/08/2020   HGBA1C 11.1 (H) 11/08/2020    Review of Glycemic Control   Current orders for Inpatient glycemic control: IV insulin per [facility]  DKA resolved. Ag 10, CO2 28 Insulin drip rate 13  Inpatient Diabetes Program Recommendations:     Transition to SQ insulin recs:  Levemir 40 units bid (give Levemir 2H prior to discontinuation of drip) Novolog 0-20 units Q4H x 12H, then tidwc and hs Novolog 12 units tidwc for meal coverage insulin if eating > 50% meal  Will follow-up in am.   Thank you. Ailene Ards, RD, LDN, CDE Inpatient Diabetes Coordinator [phone]

## 2020-11-08 NOTE — ED Provider Notes (Signed)
[facility]   CSN: [id number] Arrival date & time: 11/07/20  1112     History Chief Complaint  Patient presents with  . Hyperglycemia    [facility] is a [age] y.o. female.  Patient states compliance with her medications as scribed.  She states that she went to her doctor today because her glucoses were consistently high.  She was in the 500s.  Her physician gave her some insulin and her blood sugar went up to 700 something and this was concerning so sent her here for further evaluation.  She states that she has felt generally unwell recently but no specific complaint.  She says that she has been urinating quite a bit.  She is also but not compliant with her diet.  No specific infectious symptoms.  Blood sugars been high for the last couple days.   Hyperglycemia      Past Medical History:  Diagnosis Date  . Anemia   . Anginal pain (HCC)    LAST CP TODAY  . Anxiety   . Asthma   . Bipolar disorder (HCC)   . Blood dyscrasia   . Cataract   . Chronic bronchitis (HCC)    "q other year" (05/14/2018)  . Chronic lower back pain   . Colon polyp   . Complication of anesthesia    "couldn't get me woke when they put me to sleep for my teeth"  . Dental caries    periodontitis, lesion left mandible  . Depression 11/07   Hospitalization required  . Diverticulosis   . DVT (deep venous thrombosis) (HCC)    "LLE"  . Dyspnea    W/ EXERTION      . Esophageal stricture   . Fibromyalgia   . GERD (gastroesophageal reflux disease)   . Gout   . Headache    "weekly" (08/26/2015)     LITTLE BETTER  09/18/16  . Hemorrhoids   . History of hiatal hernia   . Hyperlipidemia   . Hypertension   . IBS (irritable bowel syndrome)   . Microcytosis   . Myocardial infarction (HCC)    "slight one years ago"  Not clear where this information comes from--has had multiple stress tests and caths showing no CAD  . Obesity   . OSA on CPAP     CPAP AT NIGHT W/ O2 PRN  . Osteoarthritis    "all over; joints" (05/14/2018)  . Pneumonia "several times"  . PONV (postoperative nausea and vomiting)   . Sickle-cell trait (HCC)   . Sleep disorder   . Spinal headache   . Type II diabetes mellitus [facility](HCC)     Patient Active Problem List   Diagnosis Date Noted  . Acute coronary syndrome (HCC) 05/14/2018  . Chest pain 06/23/2017  . Left sided numbness 06/23/2017  . Chest pain, precordial 09/18/2016    Class: Acute  . Precordial chest pain 09/18/2016  . Acute encephalopathy   . Hyperglycemia 08/26/2015  . Chronic high back pain 08/26/2015  . AKI (acute kidney injury) (HCC) 08/26/2015  . Prolonged Q-T interval on ECG 08/26/2015  . OSA (obstructive sleep apnea) 08/26/2015  . Chest pain at rest 03/09/2014  . Family history of breast cancer 03/23/2013  . Family history of GI tract cancer 03/23/2013  . Dyspnea 05/02/2012  . Pulmonary infiltrates 05/02/2012  . Cough 05/02/2012  . Family history of malignant neoplasm of gastrointestinal tract 07/25/2011  . Abdominal pain, epigastric 07/25/2011  . UNSPECIFIED IRON  DEFICIENCY ANEMIA 03/23/2009  . Arthritis, degenerative 01/05/2009  . HEMORRHOIDS, INTERNAL 10/05/2008  . RHINITIS 01/14/2008  . HYPOTENSION 11/14/2007  . CELLULITIS AND ABSCESS OF TRUNK 11/14/2007  . HYPOKALEMIA 10/28/2007  . UTI 10/28/2007  . DIZZINESS 10/28/2007  . Irritable bowel syndrome 08/13/2007  . Exophthalmos 05/26/2007  . BILIRUBINURIA 05/26/2007  . DRUG ABUSE 04/14/2007  . SINUSITIS, MAXILLARY, ACUTE 03/24/2007  . ANXIETY DISORDER, GENERALIZED 03/04/2007  . CALF PAIN 03/04/2007  . BENIGN NEOPLASM [place] DIGESTIVE SYSTEM 01/17/2007  . ESOPHAGEAL STRICTURE 01/17/2007  . Hyperlipidemia 01/14/2007  . INSOMNIA 12/03/2006  . POLYP, COLON 09/12/2006  . Uncontrolled diabetes mellitus ([place]) 09/12/2006  . Obesity, Class III, BMI 40-49.9 (morbid obesity) ([place]) 09/12/2006  . Asthma 09/12/2006  . GERD  09/12/2006  . OSTEOARTHRITIS 09/12/2006  . MICROCYTOSIS 09/12/2006  . HYSTERECTOMY, HX OF 09/12/2006  . Essential (primary) hypertension 09/12/2006  . Type 2 diabetes mellitus ([place]) 09/12/2006    Past Surgical History:  Procedure Laterality Date  . ABDOMINAL HYSTERECTOMY    . CARDIAC CATHETERIZATION N/A 09/20/2016   Procedure: Left Heart Cath and Coronary Angiography;  Surgeon: Dixie Dials, MD;  Location: [facility];  Service: Cardiovascular;  Laterality: N/A; Normal coronaries  . COLONOSCOPY    . DILATION AND CURETTAGE OF UTERUS    . ESOPHAGOGASTRODUODENOSCOPY (EGD) WITH ESOPHAGEAL DILATION  "all the time"  . ESOPHAGOGASTRODUODENOSCOPY (EGD) WITH ESOPHAGEAL DILATION     "a whole lot of times" (05/14/2018)  . EYE SURGERY Bilateral    "had surgery on the sides of my eyes to keep them popping out"  . LEFT HEART CATH AND CORONARY ANGIOGRAPHY N/A 05/16/2018   Procedure: LEFT HEART CATH AND CORONARY ANGIOGRAPHY;  Surgeon: Dixie Dials, MD;  Location: [facility];  Service: Cardiovascular;  Laterality: N/A;  . MULTIPLE EXTRACTIONS WITH ALVEOLOPLASTY N/A 10/19/2016   Procedure: MULTIPLE EXTRACTION WITH ALVEOLOPLASTY;  Surgeon: Diona Browner, DDS;  Location: [facility];  Service: Oral Surgery;  Laterality: N/A;  . MULTIPLE TOOTH EXTRACTIONS  ?1980's   "& cut into my jaw"  . TONSILLECTOMY    . TUBAL LIGATION    . UPPER GASTROINTESTINAL ENDOSCOPY       OB History   No obstetric history on file.     Family History  Problem Relation Age of Onset  . Rectal cancer Sister   . Lung cancer Father   . Esophageal cancer Father   . Breast cancer Sister 9  . Breast cancer Mother 56  . Colon cancer Other        Aunt, Uncle  . Liver cancer Brother        Nephew  . Diabetes Sister   . Diabetes Brother   . Diabetes Other        son & daughter  . Heart disease Sister   . Heart disease Brother   . Kidney disease Sister   . Breast cancer Maternal Aunt        patient reports 11  mat aunts with breast cancer  . Breast cancer Paternal Aunt 85  . Breast cancer Maternal Grandmother        unk age    Social History   Tobacco Use  . Smoking status: Never Smoker  . Smokeless tobacco: Never Used  Vaping Use  . Vaping Use: Never used  Substance Use Topics  . Alcohol use: No  . Drug use: Yes    Types: Hydrocodone    Home Medications Prior to Admission medications   Medication  Sig Start Date End Date Taking? Authorizing Provider  albuterol (PROVENTIL,VENTOLIN) 90 MCG/ACT inhaler Inhale 2 puffs into the lungs every 6 (six) hours as needed for shortness of breath. Wheezing or shortness of breath    [provider]  allopurinol (ZYLOPRIM) 100 MG tablet Take 1 tablet (100 mg total) by mouth daily. 05/20/17   Julieanne Manson, MD  amLODipine (NORVASC) 10 MG tablet Take 1 tablet (10 mg total) by mouth daily. Patient taking differently: Take 5 mg by mouth daily.  05/20/17   Julieanne Manson, MD  aspirin 81 MG tablet Take 81 mg by mouth every morning.     [provider]  citalopram (CELEXA) 10 MG tablet Take 1 tablet (10 mg total) by mouth daily. 05/20/17   Julieanne Manson, MD  COMBIGAN 0.2-0.5 % ophthalmic solution Place 1 drop into both eyes every 12 (twelve) hours. 05/20/17   Julieanne Manson, MD  diclofenac sodium (VOLTAREN) 1 % GEL Apply 2 g topically 4 (four) times daily as needed (pain). 05/20/17   Julieanne Manson, MD  docusate sodium (COLACE) 100 MG capsule Take 1 capsule (100 mg total) by mouth daily. 05/20/17   Julieanne Manson, MD  ergocalciferol (VITAMIN D2) 50000 units capsule Take 1 capsule (50,000 Units total) by mouth once a week. Usually takes on Saturday or fridays 05/20/17   Julieanne Manson, MD  fenofibrate (TRICOR) 145 MG tablet Take 1 tablet (145 mg total) by mouth daily. 05/20/17   Julieanne Manson, MD  ferrous sulfate 325 (65 FE) MG tablet Take 1 tablet (325 mg total) by mouth daily with breakfast. 05/17/18   Orpah Cobb, MD  fluticasone (FLONASE) 50 MCG/ACT nasal spray 2 sprays each nostril once daily Patient taking differently: Place 2 sprays into both nostrils daily.  05/20/17   Julieanne Manson, MD  fluticasone-salmeterol (ADVAIR HFA) 8056049019 MCG/ACT inhaler Inhale 2 puffs into the lungs 2 (two) times daily. 05/20/17   Julieanne Manson, MD  hydrochlorothiazide (HYDRODIURIL) 25 MG tablet Take 25 mg by mouth daily. 05/01/18   [provider]  HYDROcodone-acetaminophen (NORCO) 10-325 MG tablet Take 1 tablet by mouth 5 (five) times daily as needed for pain. 03/07/18   [provider]  insulin NPH-regular Human (NOVOLIN 70/30) (70-30) 100 UNIT/ML injection Inject 60 units BID Patient taking differently: Inject 60 Units into the skin 2 (two) times daily with a meal.  05/20/17   Julieanne Manson, MD  ipratropium (ATROVENT) 0.02 % nebulizer solution Take 2.5 mLs (0.5 mg total) by nebulization 3 (three) times daily. Nebulize 0.5 mg three times daily 05/20/17   Julieanne Manson, MD  ipratropium (ATROVENT) 0.03 % nasal spray Place 2 sprays into both nostrils every 12 (twelve) hours. 05/20/17   Julieanne Manson, MD  Lancets MISC Check sugars twice daily before meals 05/20/17   Julieanne Manson, MD  Latanoprostene Bunod (VYZULTA) 0.024 % SOLN Apply 1 drop to eye 2 (two) times daily.     [provider]  loratadine (CLARITIN) 10 MG tablet Take 1 tablet (10 mg total) by mouth every morning. 05/20/17   Julieanne Manson, MD  Magnesium Oxide -Mg Supplement 250 MG TABS Take 1 tablet by mouth daily. 04/14/18   [provider]  metFORMIN (GLUCOPHAGE) 500 MG tablet Take 1 tablet (500 mg total) by mouth 2 (two) times daily with a meal. No metformin for 2 days then resume. 05/17/18   Orpah Cobb, MD  metoCLOPramide (REGLAN) 10 MG tablet Take 10 mg by mouth 2 (two) times daily. 04/28/18   [provider]  metoprolol tartrate (LOPRESSOR) 50 MG tablet take 1 tablet by mouth twice a  day 07/05/17   Mack Hook, MD  nitroGLYCERIN (NITROSTAT) 0.4 MG SL tablet Place 0.4 mg under the tongue every 5 (five) minutes as needed for chest pain.  09/07/16   [provider]  omeprazole (PRILOSEC) 40 MG capsule Take 40 mg by mouth daily. 04/07/18   [provider]  polyethylene glycol (MIRALAX / GLYCOLAX) packet Take 17 g by mouth daily. Patient taking differently: Take 17 g by mouth daily as needed for mild constipation.  05/20/17   Mack Hook, MD  potassium chloride (K-DUR) 10 MEQ tablet Take 1 tablet (10 mEq total) by mouth 2 (two) times daily. 05/17/18   Dixie Dials, MD  promethazine (PHENERGAN) 25 MG tablet take 1/2 tablet by mouth every 6 hours if needed for nausea and vomiting Patient taking differently: take 25mg  by mouth every 6 hours if needed for nausea and vomiting 07/22/17   Mack Hook, MD  RESTASIS 0.05 % ophthalmic emulsion Place 1 drop into both eyes 2 (two) times daily.  09/10/16   [provider]  traZODone (DESYREL) 100 MG tablet Take 100 mg by mouth at bedtime. 02/05/18   [provider]    Allergies    Suboxone [buprenorphine hcl-naloxone hcl], Ace inhibitors, Latex, and Percocet [oxycodone-acetaminophen]  Review of Systems   Review of Systems  All other systems reviewed and are negative.   Physical Exam Updated Vital Signs BP 136/76 (BP Location: Left Arm)   Pulse (!) 122   Temp 99.7 F (37.6 C) (Oral)   Resp 19   SpO2 96%   Physical Exam Vitals and nursing note reviewed.  Constitutional:      Appearance: She is well-developed and well-nourished.  HENT:     Head: Normocephalic and atraumatic.     Nose: Nose normal. No congestion or rhinorrhea.  Cardiovascular:     Rate and Rhythm: Normal rate and regular rhythm.  Pulmonary:     Effort: No respiratory distress.     Breath sounds: No stridor.  Abdominal:     General: Abdomen is flat. There is no distension.  Musculoskeletal:        General:  No swelling or tenderness. Normal range of motion.     Cervical back: Normal range of motion.  Skin:    General: Skin is warm and dry.     Coloration: Skin is not jaundiced or pale.  Neurological:     General: No focal deficit present.     Mental Status: She is alert.     ED Results / Procedures / Treatments   Labs (all labs ordered are listed, but only abnormal results are displayed) Labs Reviewed  BASIC METABOLIC PANEL - Abnormal; Notable for the following components:      Result Value   Sodium 131 (*)    Chloride 89 (*)    Glucose, Bld 545 (*)    Creatinine, Ser 1.76 (*)    GFR, Estimated 32 (*)    Anion gap 19 (*)    All other components within normal limits  CBC - Abnormal; Notable for the following components:   RBC 5.20 (*)    MCV 75.0 (*)    MCH 23.3 (*)    RDW 17.1 (*)    All other components within normal limits  BETA-HYDROXYBUTYRIC ACID - Abnormal; Notable for the following components:   Beta-Hydroxybutyric Acid 4.25 (*)    All other components within normal limits  CBG MONITORING,  ED - Abnormal; Notable for the following components:   Glucose-Capillary 570 (*)    All other components within normal limits  CBG MONITORING, ED - Abnormal; Notable for the following components:   Glucose-Capillary 483 (*)    All other components within normal limits  CBG MONITORING, ED - Abnormal; Notable for the following components:   Glucose-Capillary 535 (*)    All other components within normal limits  URINALYSIS, ROUTINE W REFLEX MICROSCOPIC  BASIC METABOLIC PANEL  BASIC METABOLIC PANEL  BASIC METABOLIC PANEL  BASIC METABOLIC PANEL  BASIC METABOLIC PANEL  CBC WITH DIFFERENTIAL/PLATELET  URINALYSIS, ROUTINE W REFLEX MICROSCOPIC  CBG MONITORING, ED  I-STAT VENOUS BLOOD GAS, ED    EKG None  Radiology No results found.  Procedures .Critical Care Performed by: Merrily Pew, MD Authorized by: Merrily Pew, MD   Critical care provider statement:    Critical  care time (minutes):  45   Critical care was necessary to treat or prevent imminent or life-threatening deterioration of the following conditions:  Endocrine crisis   Critical care was time spent personally by me on the following activities:  Discussions with consultants, evaluation of patient's response to treatment, examination of patient, ordering and performing treatments and interventions, ordering and review of laboratory studies, ordering and review of radiographic studies, pulse oximetry, re-evaluation of patient's condition, obtaining history from patient or surrogate and review of old charts   (including critical care time)  Medications Ordered in ED Medications  lactated ringers bolus 20 mL/kg (has no administration in time range)  insulin regular, human (MYXREDLIN) 100 units/ 100 mL infusion (has no administration in time range)  lactated ringers infusion (has no administration in time range)  dextrose 5 % in lactated ringers infusion (has no administration in time range)  dextrose 50 % solution 0-50 mL (has no administration in time range)  potassium chloride 10 mEq in 100 mL IVPB (has no administration in time range)  lactated ringers bolus 1,000 mL (has no administration in time range)  lactated ringers bolus 1,000 mL (0 mLs Intravenous Stopped 11/08/20 0139)  insulin aspart (novoLOG) injection 10 Units (10 Units Intravenous Given 11/08/20 0057)    ED Course  I have reviewed the triage vital signs and the nursing notes.  Pertinent labs & imaging results that were available during my care of the patient were reviewed by me and considered in my medical decision making (see chart for details).    MDM Rules/Calculators/A&P                         Patient with mild acidosis and hyperglycemia and AKI.  We will going give some fluids and insulin.  Check a beta hydroxybutyric acid.  If not significantly elevated will recheck labs with hopeful discharge.  Patient may end up needing  admission however. Labs are not entirely consistent.  Bicarb is normal however he does have an anion gap so beta hydroxybutyric acid was added which was elevated.  This is concerning for possible mild DKA.  Venous blood gas is still pending such as a urinalysis.  We will start some glucose stabilizer and admit to the [facility] with hospitalist for admission.  Final Clinical Impression(s) / ED Diagnoses Final diagnoses:  Hyperglycemia    Rx / DC Orders ED Discharge Orders    None       Maly Lemarr, Corene Cornea, MD 11/08/20 [phone]

## 2020-11-08 NOTE — ED Notes (Signed)
ED Provider at bedside. 

## 2020-11-08 NOTE — ED Notes (Signed)
IV team arrived at bedside  

## 2020-11-08 NOTE — H&P (Signed)
History and Physical    Chelsea Lester [id number] DOB: 01-02-1956 DOA: 11/07/2020  [facility]P: [facility] [place]  Patient coming from: [facility] office   Chief Complaint:  Chief Complaint  Patient presents with  . Hyperglycemia     HPI:    65 year old female with past medical history of hypertension, hypothyroidism, iron deficiency anemia, asthma, hyperlipidemia, gastroesophageal reflux disease, obstructive sleep apnea and diabetes mellitus type 2 who presents to [facility] emergency department at the direction of her outpatient provider.  Patient states that for the past several days she has been experiencing numerous complaints including malaise, myalgias, chills, bilateral earaches, headaches, dry nonproductive cough and generalized abdominal pain.  Patient is unable to provide further details specifying any of these complaints.  She states that she is compliant with her home insulin regimen however she then states that she has not checked her sugars in approximately 2 days because she was not feeling well.  Patient states that she followed up with her primary care provider on 1/3 for regularly scheduled appointment.  Patient was noted to have extremely high blood sugar in excess of 600.  Patient states that she was then instructed to go to the emergency room for further management.  Upon evaluation at [facility] emergency department patient was noted to be markedly hyperglycemic with initial blood sugar of 545.  Patient was also noted to have an anion gap on chemistry with elevated beta hydroxybutyrate of 4.25.  Emergency department vitals concern for possible DKA or hyperosmolar state and therefore initiated the patient on intravenous fluid bolus followed by initiation of insulin infusion.  The hospitalist group was then called to assess the patient for admission the hospital.  Review of Systems:   Review of Systems  Constitutional: Positive for malaise/fatigue.   HENT: Positive for ear pain.   Respiratory: Positive for cough.   Gastrointestinal: Positive for abdominal pain.  Musculoskeletal: Positive for myalgias.  Neurological: Positive for weakness.  All other systems reviewed and are negative.   Past Medical History:  Diagnosis Date  . Anemia   . Anginal pain ([place])    LAST CP TODAY  . Anxiety   . Asthma   . Bipolar disorder ([place])   . Blood dyscrasia   . Cataract   . Chronic bronchitis ([place])    "q other year" (05/14/2018)  . Chronic lower back pain   . Colon polyp   . Complication of anesthesia    "couldn't get me woke when they put me to sleep for my teeth"  . Dental caries    periodontitis, lesion left mandible  . Depression 11/07   Hospitalization required  . Diverticulosis   . DVT (deep venous thrombosis) (HCC)    "LLE"  . Dyspnea    W/ EXERTION      . Esophageal stricture   . Fibromyalgia   . GERD (gastroesophageal reflux disease)   . Gout   . Headache    "weekly" (08/26/2015)     LITTLE BETTER  09/18/16  . Hemorrhoids   . History of hiatal hernia   . Hyperlipidemia   . Hypertension   . IBS (irritable bowel syndrome)   . Microcytosis   . Myocardial infarction ([place])    "slight one years ago"  Not clear where this information comes from--has had multiple stress tests and caths showing no CAD  . Obesity   . OSA on CPAP    CPAP AT NIGHT W/ O2 PRN  . Osteoarthritis    "  all over; joints" (05/14/2018)  . Pneumonia "several times"  . PONV (postoperative nausea and vomiting)   . Sickle-cell trait ([place])   . Sleep disorder   . Spinal headache   . Type II diabetes mellitus ([place])     Past Surgical History:  Procedure Laterality Date  . ABDOMINAL HYSTERECTOMY    . CARDIAC CATHETERIZATION N/A 09/20/2016   Procedure: Left Heart Cath and Coronary Angiography;  Surgeon: Dixie Dials, MD;  Location: [place] [facility];  Service: Cardiovascular;  Laterality: N/A; Normal coronaries  . COLONOSCOPY    . DILATION AND CURETTAGE  OF UTERUS    . ESOPHAGOGASTRODUODENOSCOPY (EGD) WITH ESOPHAGEAL DILATION  "all the time"  . ESOPHAGOGASTRODUODENOSCOPY (EGD) WITH ESOPHAGEAL DILATION     "a whole lot of times" (05/14/2018)  . EYE SURGERY Bilateral    "had surgery on the sides of my eyes to keep them popping out"  . LEFT HEART CATH AND CORONARY ANGIOGRAPHY N/A 05/16/2018   Procedure: LEFT HEART CATH AND CORONARY ANGIOGRAPHY;  Surgeon: Dixie Dials, MD;  Location: [place] [facility];  Service: Cardiovascular;  Laterality: N/A;  . MULTIPLE EXTRACTIONS WITH ALVEOLOPLASTY N/A 10/19/2016   Procedure: MULTIPLE EXTRACTION WITH ALVEOLOPLASTY;  Surgeon: Diona Browner, DDS;  Location: [place];  Service: Oral Surgery;  Laterality: N/A;  . MULTIPLE TOOTH EXTRACTIONS  ?1980's   "& cut into my jaw"  . TONSILLECTOMY    . TUBAL LIGATION    . UPPER GASTROINTESTINAL ENDOSCOPY       reports that she has never smoked. She has never used smokeless tobacco. She reports current drug use. Drug: Hydrocodone. She reports that she does not drink alcohol.  Allergies  Allergen Reactions  . Suboxone [Buprenorphine Hcl-Naloxone Hcl] Other (See Comments)    Itchy rash--difficult history.  . Ace Inhibitors Cough    UNSPECIFIED REACTION   . Latex Rash  . Percocet [Oxycodone-Acetaminophen] Itching         Family History  Problem Relation Age of Onset  . Rectal cancer Sister   . Lung cancer Father   . Esophageal cancer Father   . Breast cancer Sister 55  . Breast cancer Mother 65  . Colon cancer Other        Aunt, Uncle  . Liver cancer Brother        Nephew  . Diabetes Sister   . Diabetes Brother   . Diabetes Other        son & daughter  . Heart disease Sister   . Heart disease Brother   . Kidney disease Sister   . Breast cancer Maternal Aunt        patient reports 11 mat aunts with breast cancer  . Breast cancer Paternal Aunt 85  . Breast cancer Maternal Grandmother        unk age     Prior to Admission medications   Medication Sig  Start Date End Date Taking? Authorizing Provider  albuterol (PROVENTIL,VENTOLIN) 90 MCG/ACT inhaler Inhale 2 puffs into the lungs every 6 (six) hours as needed for shortness of breath. Wheezing or shortness of breath    [provider]  allopurinol (ZYLOPRIM) 100 MG tablet Take 1 tablet (100 mg total) by mouth daily. 05/20/17   Mack Hook, MD  amLODipine (NORVASC) 10 MG tablet Take 1 tablet (10 mg total) by mouth daily. Patient taking differently: Take 5 mg by mouth daily.  05/20/17   Mack Hook, MD  aspirin 81 MG tablet Take 81 mg by mouth every morning.  [provider]  citalopram (CELEXA) 10 MG tablet Take 1 tablet (10 mg total) by mouth daily. 05/20/17   Julieanne MansonMulberry, Elizabeth, MD  COMBIGAN 0.2-0.5 % ophthalmic solution Place 1 drop into both eyes every 12 (twelve) hours. 05/20/17   Julieanne MansonMulberry, Elizabeth, MD  diclofenac sodium (VOLTAREN) 1 % GEL Apply 2 g topically 4 (four) times daily as needed (pain). 05/20/17   Julieanne MansonMulberry, Elizabeth, MD  docusate sodium (COLACE) 100 MG capsule Take 1 capsule (100 mg total) by mouth daily. 05/20/17   Julieanne MansonMulberry, Elizabeth, MD  ergocalciferol (VITAMIN D2) 50000 units capsule Take 1 capsule (50,000 Units total) by mouth once a week. Usually takes on Saturday or fridays 05/20/17   Julieanne MansonMulberry, Elizabeth, MD  fenofibrate (TRICOR) 145 MG tablet Take 1 tablet (145 mg total) by mouth daily. 05/20/17   Julieanne MansonMulberry, Elizabeth, MD  ferrous sulfate 325 (65 FE) MG tablet Take 1 tablet (325 mg total) by mouth daily with breakfast. 05/17/18   Orpah CobbKadakia, Ajay, MD  fluticasone (FLONASE) 50 MCG/ACT nasal spray 2 sprays each nostril once daily Patient taking differently: Place 2 sprays into both nostrils daily.  05/20/17   Julieanne MansonMulberry, Elizabeth, MD  fluticasone-salmeterol (ADVAIR HFA) [phone] MCG/ACT inhaler Inhale 2 puffs into the lungs 2 (two) times daily. 05/20/17   Julieanne MansonMulberry, Elizabeth, MD  hydrochlorothiazide (HYDRODIURIL) 25 MG tablet Take 25 mg by mouth daily.  05/01/18   [provider]  HYDROcodone-acetaminophen (NORCO) 10-325 MG tablet Take 1 tablet by mouth 5 (five) times daily as needed for pain. 03/07/18   [provider]  insulin NPH-regular Human (NOVOLIN 70/30) (70-30) 100 UNIT/ML injection Inject 60 units BID Patient taking differently: Inject 60 Units into the skin 2 (two) times daily with a meal.  05/20/17   Julieanne MansonMulberry, Elizabeth, MD  ipratropium (ATROVENT) 0.02 % nebulizer solution Take 2.5 mLs (0.5 mg total) by nebulization 3 (three) times daily. Nebulize 0.5 mg three times daily 05/20/17   Julieanne MansonMulberry, Elizabeth, MD  ipratropium (ATROVENT) 0.03 % nasal spray Place 2 sprays into both nostrils every 12 (twelve) hours. 05/20/17   Julieanne MansonMulberry, Elizabeth, MD  Lancets MISC Check sugars twice daily before meals 05/20/17   Julieanne MansonMulberry, Elizabeth, MD  Latanoprostene Bunod (VYZULTA) 0.024 % SOLN Apply 1 drop to eye 2 (two) times daily.     [provider]  loratadine (CLARITIN) 10 MG tablet Take 1 tablet (10 mg total) by mouth every morning. 05/20/17   Julieanne MansonMulberry, Elizabeth, MD  Magnesium Oxide -Mg Supplement 250 MG TABS Take 1 tablet by mouth daily. 04/14/18   [provider]  metFORMIN (GLUCOPHAGE) 500 MG tablet Take 1 tablet (500 mg total) by mouth 2 (two) times daily with a meal. No metformin for 2 days then resume. 05/17/18   Orpah CobbKadakia, Ajay, MD  metoCLOPramide (REGLAN) 10 MG tablet Take 10 mg by mouth 2 (two) times daily. 04/28/18   [provider]  metoprolol tartrate (LOPRESSOR) 50 MG tablet take 1 tablet by mouth twice a day 07/05/17   Julieanne MansonMulberry, Elizabeth, MD  nitroGLYCERIN (NITROSTAT) 0.4 MG SL tablet Place 0.4 mg under the tongue every 5 (five) minutes as needed for chest pain.  09/07/16   [provider]  omeprazole (PRILOSEC) 40 MG capsule Take 40 mg by mouth daily. 04/07/18   [provider]  polyethylene glycol (MIRALAX / GLYCOLAX) packet Take 17 g by mouth daily. Patient taking differently: Take 17 g by  mouth daily as needed for mild constipation.  05/20/17   Julieanne MansonMulberry, Elizabeth, MD  potassium chloride (K-DUR) 10 MEQ  tablet Take 1 tablet (10 mEq total) by mouth 2 (two) times daily. 05/17/18   Dixie Dials, MD  promethazine (PHENERGAN) 25 MG tablet take 1/2 tablet by mouth every 6 hours if needed for nausea and vomiting Patient taking differently: take 25mg  by mouth every 6 hours if needed for nausea and vomiting 07/22/17   Mack Hook, MD  RESTASIS 0.05 % ophthalmic emulsion Place 1 drop into both eyes 2 (two) times daily.  09/10/16   [provider]  traZODone (DESYREL) 100 MG tablet Take 100 mg by mouth at bedtime. 02/05/18   [provider]    Physical Exam: Vitals:   11/08/20 0508 11/08/20 0530 11/08/20 0600 11/08/20 0748  BP: 129/82 125/70 125/71   Pulse:  100 97   Resp: 19 (!) 21 (!) 32   Temp:    98.9 F (37.2 C)  TempSrc:    Oral  SpO2: 98% 97% 96%     Constitutional: Acute alert and oriented x3, no associated distress.   Skin: no rashes, no lesions, good skin turgor noted. Eyes: Pupils are equally reactive to light.  No evidence of scleral icterus or conjunctival pallor.  ENMT: Otoscopic examination revealed substantial cerumen in the bilateral ear canals and therefore I was unable to evaluate the tympanic membranes.  Moist mucous membranes noted.  Posterior pharynx clear of any exudate or lesions.   Neck: normal, supple, no masses, no thyromegaly.  No evidence of jugular venous distension.   Respiratory: clear to auscultation bilaterally, no wheezing, no crackles. Normal respiratory effort. No accessory muscle use.  Cardiovascular: Regular rate and rhythm, no murmurs / rubs / gallops. No extremity edema. 2+ pedal pulses. No carotid bruits.  Chest:   Nontender without crepitus or deformity.   Back:   Nontender without crepitus or deformity. Abdomen: Generalized abdominal tenderness noted.  Abdomen is soft however.  No evidence of intra-abdominal masses.   Positive bowel sounds noted in all quadrants.   Musculoskeletal: No joint deformity upper and lower extremities. Good ROM, no contractures. Normal muscle tone.  Neurologic: CN 2-12 grossly intact. Sensation intact.  Patient moving all 4 extremities spontaneously.  Patient is following all commands.  Patient is responsive to verbal stimuli.   Psychiatric: Patient exhibits depressed mood with flat affect.  Patient seems to possess insight as to their current situation.     Labs on Admission: I have personally reviewed following labs and imaging studies -   CBC: Recent Labs  Lab 11/07/20 1149 11/08/20 0351 11/08/20 0404  WBC 6.8 10.6*  --   NEUTROABS  --  8.5*  --   HGB 12.1 11.1* 12.6  HCT 39.0 34.2* 37.0  MCV 75.0* 72.9*  --   PLT 314 293  --    Basic Metabolic Panel: Recent Labs  Lab 11/07/20 1149 11/08/20 0351 11/08/20 0404  NA 131* 130* 133*  K 3.6 3.8 3.8  CL 89* 89*  --   CO2 23 27  --   GLUCOSE 545* 546*  --   BUN 21 27*  --   CREATININE 1.76* 1.92*  --   CALCIUM 9.4 8.6*  --    GFR: CrCl cannot be calculated (Unknown ideal weight.). Liver Function Tests: No results for input(s): AST, ALT, ALKPHOS, BILITOT, PROT, ALBUMIN in the last 168 hours. No results for input(s): LIPASE, AMYLASE in the last 168 hours. No results for input(s): AMMONIA in the last 168 hours. Coagulation Profile: No results for input(s): INR, PROTIME in the last 168 hours. Cardiac  Enzymes: No results for input(s): CKTOTAL, CKMB, CKMBINDEX, TROPONINI in the last 168 hours. BNP (last 3 results) No results for input(s): PROBNP in the last 8760 hours. HbA1C: Recent Labs    11/08/20 0351  HGBA1C 11.1*   CBG: Recent Labs  Lab 11/08/20 0431 11/08/20 0509 11/08/20 0610 11/08/20 0641 11/08/20 0731  GLUCAP 581* 557* 497* 462* 425*   Lipid Profile: No results for input(s): CHOL, HDL, LDLCALC, TRIG, CHOLHDL, LDLDIRECT in the last 72 hours. Thyroid Function Tests: No results for input(s):  TSH, T4TOTAL, FREET4, T3FREE, THYROIDAB in the last 72 hours. Anemia Panel: No results for input(s): VITAMINB12, FOLATE, FERRITIN, TIBC, IRON, RETICCTPCT in the last 72 hours. Urine analysis:    Component Value Date/Time   COLORURINE YELLOW 11/08/2020 0425   APPEARANCEUR CLEAR 11/08/2020 0425   LABSPEC 1.026 11/08/2020 0425   PHURINE 5.0 11/08/2020 0425   GLUCOSEU >=500 (A) 11/08/2020 0425   GLUCOSEU NEG mg/dL 32/99/2426 8341   HGBUR NEGATIVE 11/08/2020 0425   HGBUR negative 10/28/2007 1023   BILIRUBINUR NEGATIVE 11/08/2020 0425   KETONESUR 5 (A) 11/08/2020 0425   PROTEINUR NEGATIVE 11/08/2020 0425   UROBILINOGEN 0.2 08/26/2015 2120   NITRITE NEGATIVE 11/08/2020 0425   LEUKOCYTESUR TRACE (A) 11/08/2020 0425    Radiological Exams on Admission - Personally Reviewed: DG Chest Portable 1 View  Result Date: 11/08/2020 CLINICAL DATA:  Hyperglycemia for several days EXAM: PORTABLE CHEST 1 VIEW COMPARISON:  05/05/2019 FINDINGS: Cardiomegaly and low lung volumes with interstitial crowding at the bases, similar to prior. There is no edema, consolidation, effusion, or pneumothorax. IMPRESSION: Stable compared to prior.  No acute finding. Electronically Signed   By: Marnee Spring M.D.   On: 11/08/2020 04:22    EKG: Personally reviewed.  Rhythm is sinus tachycardia with heart rate of 106 bpm.  No dynamic ST segment changes appreciated.  Assessment/Plan Principal Problem:   Hyperosmolar non-ketotic state due to type 2 diabetes mellitus [facility])   Patient presenting with markedly elevated blood sugars in excess of 600 at her primary care office and 545 on arrival here.  While patient does have a slightly elevated beta hydroxybutyrate patient does not possess an anion gap on chemistry with normal pH on VBG.  Some patients do exhibit overlapping syndromes of hyperosmolar nonketotic state and diabetic ketoacidosis.  I feel the patient is mostly suffering from a mild hyperosmolar nonketotic state  with some degree of DKA.  Hydrating patient with multiple liters of intravenous isotonic fluids followed by fluid infusion.  Patient currently on insulin infusion  Will quickly transition patient back over to basal bolus insulin regimen once glycemic control has improved  While this is ongoing considering patient's numerous complaints we are performing a work-up for infection including chest x-ray, urinalysis and Covid testing and will treat accordingly.  Hemoglobin A1c pending.  I suspect poor medication compliance in the outpatient setting.  Active Problems:   AKI (acute kidney injury) (HCC)   Notable renal injury with creatinine of 1.76, up from 0.92 at baseline  This is likely secondary to relative volume depletion from poorly controlled diabetes  Hydrating patient with intravenous isotonic fluids  Strict input and output monitoring  Minimizing nephrotoxic agents if at all possible  Monitoring renal function and electrolytes with serial chemistries    Hypothyroidism   Documented history of hypothyroidism however patient is not on any thyroid replacement therapy per review of medication reconciliation (although this is yet to be updated by pharmacy)  Will obtain TSH, await final medication reconciliation  GERD without esophagitis   Daily PPI   Code Status:  Full code Family Communication: deferred   Status is: Observation  The patient remains OBS appropriate and will d/c before 2 midnights.  Dispo: The patient is from: Home              Anticipated d/c is to: Home              Anticipated d/c date is: 2 days              Patient currently is not medically stable to d/c.        Vernelle Emerald MD [facility] Pager [phone]  If 7PM-7AM, please contact night-coverage [email or web address] Use universal [place] password for that web site. If you do not have the password, please call the hospital operator.  11/08/2020, 7:52 AM

## 2020-11-08 NOTE — ED Notes (Signed)
Transition of care. Report to [place], Charity fundraiser

## 2020-11-08 NOTE — ED Notes (Signed)
Date and time results received: 11/08/20 0439   Test: GLucose Critical Value: 546  Name of Provider Notified: SHalhoub MD  Insulin drip ongoing

## 2020-11-09 DIAGNOSIS — G4733 Obstructive sleep apnea (adult) (pediatric): Secondary | ICD-10-CM

## 2020-11-09 DIAGNOSIS — K219 Gastro-esophageal reflux disease without esophagitis: Secondary | ICD-10-CM | POA: Diagnosis not present

## 2020-11-09 DIAGNOSIS — E1100 Type 2 diabetes mellitus with hyperosmolarity without nonketotic hyperglycemic-hyperosmolar coma (NKHHC): Secondary | ICD-10-CM | POA: Diagnosis not present

## 2020-11-09 DIAGNOSIS — N179 Acute kidney failure, unspecified: Secondary | ICD-10-CM | POA: Diagnosis not present

## 2020-11-09 DIAGNOSIS — E039 Hypothyroidism, unspecified: Secondary | ICD-10-CM | POA: Diagnosis not present

## 2020-11-09 LAB — BASIC METABOLIC PANEL
Anion gap: 11 (ref 5–15)
BUN: 17 mg/dL (ref 8–23)
CO2: 28 mmol/L (ref 22–32)
Calcium: 8.7 mg/dL — ABNORMAL LOW (ref 8.9–10.3)
Chloride: 97 mmol/L — ABNORMAL LOW (ref 98–111)
Creatinine, Ser: 1.15 mg/dL — ABNORMAL HIGH (ref 0.44–1.00)
GFR, Estimated: 53 mL/min — ABNORMAL LOW (ref >60)
Glucose, Bld: 165 mg/dL — ABNORMAL HIGH (ref 70–99)
Potassium: 3.1 mmol/L — ABNORMAL LOW (ref 3.5–5.1)
Sodium: 136 mmol/L (ref 135–145)

## 2020-11-09 LAB — GLUCOSE, CAPILLARY
Glucose-Capillary: 223 mg/dL — ABNORMAL HIGH (ref 70–99)
Glucose-Capillary: 306 mg/dL — ABNORMAL HIGH (ref 70–99)
Glucose-Capillary: 294 mg/dL — ABNORMAL HIGH (ref 70–99)

## 2020-11-09 MED ORDER — NOVOLIN 70/30 (70-30) 100 UNIT/ML ~~LOC~~ SUSP: SUBCUTANEOUS | Status: DC

## 2020-11-09 MED ORDER — POTASSIUM CHLORIDE CRYS ER 20 MEQ PO TBCR
40.0000 meq | EXTENDED_RELEASE_TABLET | Freq: Once | ORAL | Status: AC
  Administered 2020-11-09: 40 meq via ORAL
  Filled 2020-11-09: qty 2

## 2020-11-09 NOTE — Discharge Instructions (Signed)
Hyperglycemic Hyperosmolar State Hyperglycemic hyperosmolar state is a serious condition in which you experience an extreme increase in your blood sugar (glucose) level. This makes your body become extremely dehydrated, which can be life-threatening. This condition is a result of uncontrolled or undiagnosed diabetes. It occurs most often in people who have type 2 diabetes (type 2 diabetes mellitus). Certain hormones (insulin and glucagon) control the level of glucose that is in the blood. Insulin lowers blood glucose, and glucagon increases blood glucose. Hyperglycemia can result from having too little insulin in the bloodstream, or from the body not responding normally to insulin. Normally, the body gets rid of excess glucose through urine. If you do not drink enough fluids, or if you drink fluids that contain sugar, your body cannot get rid of excess glucose. This can result in hyperglycemic hyperosmolar state. What are the causes? This condition may be caused by:  Infection.  Medicines that cause you to become dehydrated or cause you to lose fluid.  Certain illnesses.  Not taking your diabetes medicine.  New onset or diagnosis of diabetes.  Cardiovascular disease (CVD). What increases the risk? The following factors make you more likely to develop this condition:  Older age.  Poor management of diabetes.  Inability to eat or drink normally.  Heart failure.  Infection.  Surgery.  Illness. What are the signs or symptoms? Symptoms of this condition include:  Extreme or increased thirst. This symptom may gradually disappear.  Needing to urinate more often than usual.  Dry mouth.  Warm, dry skin that does not sweat even in high temperatures.  High fever.  Sleepiness or confusion.  Vision problems or vision loss.  Seeing, hearing, tasting, smelling, or feeling things that are not real (hallucinations).  Weakness.  Weight loss.  Vomiting. How is this  diagnosed? Hyperglycemic hyperosmolar state is diagnosed based on your medical history, your symptoms, and a blood test to measure your blood glucose level. How is this treated? This condition is treated in the hospital. The goals of treatment are:  To correct dehydration by replacing fluids that you have lost. Fluids will be given through an IV tube.  To improve blood sugar levels using insulin or other medicines as needed.  To treat the cause of hyperglycemia, such as an infection, illness, or newly diagnosed diabetes. Follow these instructions at home: General instructions  Take over-the-counter and prescription medicines only as told by your health care provider.  Do not use any products that contain nicotine or tobacco, such as cigarettes and e-cigarettes. If you need help quitting, ask your health care provider.  Limit alcohol intake to no more than 1 drink a day for nonpregnant women and 2 drinks a day for men. One drink equals 12 oz of beer, 5 oz of wine, or 1 oz of hard liquor.  Stay hydrated, especially when you exercise, when you get sick, or when you spend time in hot temperatures.  Learn to manage stress. If you need help with this, ask your health care provider.  Keep all follow-up visits as told by your health care provider. This is important. Eating and drinking   Maintain a healthy weight.  Exercise regularly, as directed by your health care provider.  Eat healthy foods, such as: ? Lean proteins. ? Complex carbohydrates. ? Fresh fruits and vegetables. ? Low-fat dairy products. ? Healthy fats.  Drink enough fluid to keep your urine clear or pale yellow. If You Have Diabetes:   Make sure you know the early signs   and symptoms of hyperglycemia.  Follow your diabetes management plan, as told by your health care provider. Make sure you: ? Take your insulin and medicines as directed. ? Follow your exercise plan. ? Follow your meal plan. Eat on time, and do  not skip meals. ? Check your blood glucose as often as directed. Make sure to check your blood glucose before and after exercise. If you exercise longer or in a different way than usual, check your blood glucose more often. ? Follow your sick day plan whenever you cannot eat or drink normally. Make this plan in advance with your health care provider.  Share your diabetes management plan with people in your workplace, school, and household.  Check your urine for ketones when you are ill and as often as told by your health care provider.  Carry a medical alert card or wear medical alert jewelry. Contact a health care provider if:  You cannot eat or drink without throwing up.  You develop a fever. Get help right away if:  You develop symptoms of hyperglycemic hyperosmolar state. These symptoms may represent a serious problem that is an emergency. Do not wait to see if the symptoms will go away. Get medical help right away. Call your local emergency services (911 in the [place] Do not drive yourself to the hospital. Summary  Hyperglycemic hyperosmolar state is a serious condition in which you experience an extreme increase in your blood sugar (glucose) level. This makes your body become extremely dehydrated, which can be life-threatening.  This condition is a result of uncontrolled or undiagnosed diabetes. It occurs most often in people who have type 2 diabetes (type 2 diabetes mellitus).  This condition is treated in the hospital. Treatment may include fluids given through an IV tube and other medicines.  Make sure you know the early signs and symptoms of hyperglycemia.  Follow your diabetes management plan, as told by your health care provider. This information is not intended to replace advice given to you by your health care provider. Make sure you discuss any questions you have with your health care provider. Document Revised: 10/15/2016 Document Reviewed: 10/15/2016 Elsevier Patient  Education  2020 [employer]  

## 2020-11-09 NOTE — Progress Notes (Signed)
D/C instructions given and reviewed. No questions asked but encouraged to call with any concerns. Tele and IV's removed, tolerated well. 

## 2020-11-09 NOTE — Discharge Summary (Addendum)
Physician Discharge Summary  Chelsea Lester ZOX:[id number] DOB: 03/02/1956 DOA: 11/07/2020  PCP: [facility]  Admit date: 11/07/2020 Discharge date: 11/09/2020  Time spent: 45 minutes  Recommendations for Outpatient Follow-up:  Patient will be discharged to home.  Patient will need to follow up with primary care provider within one week of discharge, repeat BMP.  Patient should continue medications as prescribed.  Patient should follow a heart healthy/carb modified diet.   Discharge Diagnoses:  Hyperosmolar nonketotic state/diabetes mellitus, type II Acute kidney injury Hypothyroidism GERD without esophagitis Essential hypertension Hyperlipidemia  Discharge Condition: stable   Diet recommendation: heart healthy/Carb modified   Filed Weights   11/08/20 1854  Weight: 130.7 kg    History of present illness:  On 11/08/2020 by Dr. Shauna Hugh 65 year old female with past medical history of hypertension, hypothyroidism, iron deficiency anemia, asthma, hyperlipidemia, gastroesophageal reflux disease, obstructive sleep apnea and diabetes mellitus type 2 who presents to [facility] emergency department at the direction of her outpatient provider.  Patient states that for the past several days she has been experiencing numerous complaints including malaise, myalgias, chills, bilateral earaches, headaches, dry nonproductive cough and generalized abdominal pain.  Patient is unable to provide further details specifying any of these complaints.  She states that she is compliant with her home insulin regimen however she then states that she has not checked her sugars in approximately 2 days because she was not feeling well.  Patient states that she followed up with her primary care provider on 1/3 for regularly scheduled appointment.  Patient was noted to have extremely high blood sugar in excess of 600.  Patient states that she was then instructed to go to the  emergency room for further management.  Upon evaluation at [facility] emergency department patient was noted to be markedly hyperglycemic with initial blood sugar of 545.  Patient was also noted to have an anion gap on chemistry with elevated beta hydroxybutyrate of 4.25.  Emergency department vitals concern for possible DKA or hyperosmolar state and therefore initiated the patient on intravenous fluid bolus followed by initiation of insulin infusion.  The hospitalist group was then called to assess the patient for admission the hospital.   Hospital Course:  Hyperosmolar nonketotic state/diabetes mellitus, type II -Patient presented with elevated blood sugars in excess of 600 at her primary care office as well as 545 upon arrival to the emergency room.  She also had slightly elevated beta hydroxybutyrate but no anion gap and normal pH on VBG. -Patient was placed on insulin infusion and then was transitioned back to her home insulin regimen of 70/30 -Hemoglobin A1c 11.1 -Patient tells me that prior to admission, her doctor had decreased her 70/30 from 60 units to 50 units twice daily. -Will discharge patient back with 60 units twice daily with close follow-up with PCP. -Continue Ozempic, glipizide and Metformin on discharge   Acute kidney injury -Creatinine on admission was 1.76 -Suspect secondary to the above -Creatinine today 1.15 -Repeat BMP in 1 week  Hypothyroidism -Patient with documented history of hypothyroidism however not on any thyroid replacement medication -TSH 0.598  GERD without esophagitis -Continue PPI  Essential hypertension -Continue home medications on discharge  Hyperlipidemia -Continue statin  Procedures: None  Consultations: None  Discharge Exam: Vitals:   11/08/20 1854 11/08/20 1900  BP: 132/76 110/61  Pulse:  90  Resp: 12   Temp: 99.4 F (37.4 C) 98.3 F (36.8 C)  SpO2: 98%      General:  Well developed, well nourished, NAD, appears  stated age  67: NCAT, mucous membranes moist.  Cardiovascular: S1 S2 auscultated, RRR  Respiratory: Clear to auscultation bilaterally with equal chest rise  Abdomen: Soft, obese, nontender, nondistended, + bowel sounds  Extremities: warm dry without cyanosis clubbing or edema  Neuro: AAOx3, nonfocal  Psych: appropriate  Discharge Instructions Discharge Instructions    Discharge instructions   Complete by: As directed    Patient will be discharged to home.  Patient will need to follow up with primary care provider within one week of discharge, repeat BMP.  Patient should continue medications as prescribed.  Patient should follow a heart healthy/carb modified diet.     Allergies as of 11/09/2020      Reactions   Suboxone [buprenorphine Hcl-naloxone Hcl] Other (See Comments)   Itchy rash--difficult history.   Ace Inhibitors Cough   Latex Rash   Percocet [oxycodone-acetaminophen] Itching          Medication List    TAKE these medications   albuterol 90 MCG/ACT inhaler Commonly known as: PROVENTIL,VENTOLIN Inhale 2 puffs into the lungs every 6 (six) hours as needed for shortness of breath. Wheezing or shortness of breath   allopurinol 100 MG tablet Commonly known as: ZYLOPRIM Take 1 tablet (100 mg total) by mouth daily. What changed: how much to take   amLODipine 10 MG tablet Commonly known as: NORVASC Take 1 tablet (10 mg total) by mouth daily.   aspirin 81 MG tablet Take 81 mg by mouth every morning.   cetirizine 10 MG tablet Commonly known as: ZYRTEC Take 10 mg by mouth daily.   Combigan 0.2-0.5 % ophthalmic solution Generic drug: brimonidine-timolol Place 1 drop into both eyes every 12 (twelve) hours.   Dexilant 60 MG capsule Generic drug: dexlansoprazole Take 60 mg by mouth daily.   diclofenac sodium 1 % Gel Commonly known as: VOLTAREN Apply 2 g topically 4 (four) times daily as needed (pain).   docusate sodium 100 MG capsule Commonly known as:  COLACE Take 1 capsule (100 mg total) by mouth daily.   DRY EYES OP Apply 1 drop to eye daily as needed (for dry eyes).   ergocalciferol 1.25 MG (50000 UT) capsule Commonly known as: VITAMIN D2 Take 1 capsule (50,000 Units total) by mouth once a week. Usually takes on Saturday or fridays What changed:   when to take this  additional instructions   famotidine 20 MG tablet Commonly known as: PEPCID Take 20 mg by mouth 2 (two) times daily.   fenofibrate micronized 200 MG capsule Commonly known as: LOFIBRA Take 200 mg by mouth at bedtime.   ferrous sulfate 325 (65 FE) MG tablet Take 1 tablet (325 mg total) by mouth daily with breakfast.   fluticasone 50 MCG/ACT nasal spray Commonly known as: FLONASE 2 sprays each nostril once daily What changed:   how much to take  how to take this  when to take this  additional instructions   fluticasone-salmeterol 115-21 MCG/ACT inhaler Commonly known as: ADVAIR HFA Inhale 2 puffs into the lungs 2 (two) times daily.   furosemide 20 MG tablet Commonly known as: LASIX Take 20 mg by mouth daily.   gabapentin 600 MG tablet Commonly known as: NEURONTIN Take 600 mg by mouth 3 (three) times daily.   glipiZIDE 5 MG tablet Commonly known as: GLUCOTROL Take 5 mg by mouth daily before breakfast.   hydrochlorothiazide 25 MG tablet Commonly known as: HYDRODIURIL Take 50 mg by mouth daily.  HYDROcodone-acetaminophen 10-325 MG tablet Commonly known as: NORCO Take 1 tablet by mouth every 6 (six) hours as needed for pain.   ipratropium 0.02 % nebulizer solution Commonly known as: ATROVENT Take 2.5 mLs (0.5 mg total) by nebulization 3 (three) times daily. Nebulize 0.5 mg three times daily What changed: additional instructions   Lancets Misc Check sugars twice daily before meals   Latanoprostene Bunod 0.024 % Soln Apply 1 drop to eye 2 (two) times daily.   Magnesium Oxide -Mg Supplement 250 MG Tabs Take 1 tablet by mouth daily.    metFORMIN 500 MG tablet Commonly known as: GLUCOPHAGE Take 1 tablet (500 mg total) by mouth 2 (two) times daily with a meal. No metformin for 2 days then resume. What changed:   how much to take  when to take this  additional instructions   metoprolol tartrate 50 MG tablet Commonly known as: LOPRESSOR take 1 tablet by mouth twice a day   nitroGLYCERIN 0.4 MG SL tablet Commonly known as: NITROSTAT Place 0.4 mg under the tongue every 5 (five) minutes as needed for chest pain (max 3 doses).   NovoLIN 70/30 (70-30) 100 UNIT/ML injection Generic drug: insulin NPH-regular Human Inject 60 units BID What changed:   how much to take  how to take this  when to take this  additional instructions   ofloxacin 0.3 % ophthalmic solution Commonly known as: OCUFLOX Place 1 drop into the right eye 4 (four) times daily.   oxyCODONE 5 MG immediate release tablet Commonly known as: Oxy IR/ROXICODONE Take 10 mg by mouth every 4 (four) hours.   Ozempic (1 MG/DOSE) 2 MG/1.5ML Sopn Generic drug: Semaglutide (1 MG/DOSE) Inject 1 mg into the skin every Saturday.   polyethylene glycol 17 g packet Commonly known as: MIRALAX / GLYCOLAX Take 17 g by mouth daily. What changed:   when to take this  reasons to take this   potassium chloride 10 MEQ tablet Commonly known as: KLOR-CON Take 1 tablet (10 mEq total) by mouth 2 (two) times daily. What changed:   how much to take  when to take this   pravastatin 40 MG tablet Commonly known as: PRAVACHOL Take 40 mg by mouth at bedtime.   promethazine 25 MG tablet Commonly known as: PHENERGAN take 1/2 tablet by mouth every 6 hours if needed for nausea and vomiting What changed: See the new instructions.   sucralfate 1 g tablet Commonly known as: CARAFATE Take 1 g by mouth 2 (two) times daily.   tiZANidine 4 MG capsule Commonly known as: ZANAFLEX Take 4 mg by mouth 3 (three) times daily as needed for spasms.   traZODone 100 MG  tablet Commonly known as: DESYREL Take 100 mg by mouth at bedtime.   triamcinolone 0.1 % Commonly known as: KENALOG Apply 1 application topically 2 (two) times daily.   zolpidem 10 MG tablet Commonly known as: AMBIEN Take 10 mg by mouth at bedtime.      Allergies  Allergen Reactions  . Suboxone [Buprenorphine Hcl-Naloxone Hcl] Other (See Comments)    Itchy rash--difficult history.  . Ace Inhibitors Cough  . Latex Rash  . Percocet [Oxycodone-Acetaminophen] Itching         Follow-up [place]. Schedule an appointment as soon as possible for a visit in 1 week(s).   Why: Hospital follow-up Contact information: [address] [place] [address] [phone]  The results of significant diagnostics from this hospitalization (including imaging, microbiology, ancillary and laboratory) are listed below for reference.    Significant Diagnostic Studies: DG Chest Portable 1 View  Result Date: 11/08/2020 CLINICAL DATA:  Hyperglycemia for several days EXAM: PORTABLE CHEST 1 VIEW COMPARISON:  05/05/2019 FINDINGS: Cardiomegaly and low lung volumes with interstitial crowding at the bases, similar to prior. There is no edema, consolidation, effusion, or pneumothorax. IMPRESSION: Stable compared to prior.  No acute finding. Electronically Signed   By: Monte Fantasia M.D.   On: 11/08/2020 04:22    Microbiology: Recent Results (from the past 240 hour(s))  Resp Panel by RT-PCR (Flu A&B, Covid) Urine, Clean Catch     Status: None   Collection Time: 11/08/20  4:26 AM   Specimen: Urine, Clean Catch; Nasopharyngeal(NP) swabs in vial transport medium  Result Value Ref Range Status   SARS Coronavirus 2 by RT PCR NEGATIVE NEGATIVE Final    Comment: (NOTE) SARS-CoV-2 target nucleic acids are NOT DETECTED.  The SARS-CoV-2 RNA is generally detectable in upper respiratory specimens during the acute phase of infection. The lowest concentration  of SARS-CoV-2 viral copies this assay can detect is 138 copies/mL. A negative result does not preclude SARS-Cov-2 infection and should not be used as the sole basis for treatment or other patient management decisions. A negative result may occur with  improper specimen collection/handling, submission of specimen other than nasopharyngeal swab, presence of viral mutation(s) within the areas targeted by this assay, and inadequate number of viral copies(<138 copies/mL). A negative result must be combined with clinical observations, patient history, and epidemiological information. The expected result is Negative.  Fact Sheet for Patients:  EntrepreneurPulse.com.au  Fact Sheet for Healthcare Providers:  IncredibleEmployment.be  This test is no t yet approved or cleared by the [place] FDA and  has been authorized for detection and/or diagnosis of SARS-CoV-2 by FDA under an Emergency Use Authorization (EUA). This EUA will remain  in effect (meaning this test can be used) for the duration of the COVID-19 declaration under Section 564(b)(1) of the Act, 21 U.S.C.section 360bbb-3(b)(1), unless the authorization is terminated  or revoked sooner.       Influenza A by PCR NEGATIVE NEGATIVE Final   Influenza B by PCR NEGATIVE NEGATIVE Final    Comment: (NOTE) The Xpert Xpress SARS-CoV-2/FLU/RSV plus assay is intended as an aid in the diagnosis of influenza from Nasopharyngeal swab specimens and should not be used as a sole basis for treatment. Nasal washings and aspirates are unacceptable for Xpert Xpress SARS-CoV-2/FLU/RSV testing.  Fact Sheet for Patients: EntrepreneurPulse.com.au  Fact Sheet for Healthcare Providers: IncredibleEmployment.be  This test is not yet approved or cleared by the [place] FDA and has been authorized for detection and/or diagnosis of SARS-CoV-2 by FDA under an Emergency Use  Authorization (EUA). This EUA will remain in effect (meaning this test can be used) for the duration of the COVID-19 declaration under Section 564(b)(1) of the Act, 21 U.S.C. section 360bbb-3(b)(1), unless the authorization is terminated or revoked.  Performed at [facility], [place] [address] [place], [place] [address]      Labs: Basic Metabolic Panel: Recent Labs  Lab 11/08/20 0351 11/08/20 0404 11/08/20 0653 11/08/20 1053 11/08/20 1603 11/09/20 0219  NA 130* 133* 133* 134* 135 136  K 3.8 3.8 3.3* 3.2* 3.4* 3.1*  CL 89*  --  93* 96* 97* 97*  CO2 27  --  23 27 28 28   GLUCOSE 546*  --  488* 326* 349* 165*  BUN 27*  --  27* 22 21 17   CREATININE 1.92*  --  1.79* 1.50* 1.44* 1.15*  CALCIUM 8.6*  --  8.6* 8.8* 8.2* 8.7*   Liver Function Tests: No results for input(s): AST, ALT, ALKPHOS, BILITOT, PROT, ALBUMIN in the last 168 hours. Recent Labs  Lab 11/08/20 0642  LIPASE 19   No results for input(s): AMMONIA in the last 168 hours. CBC: Recent Labs  Lab 11/07/20 1149 11/08/20 0351 11/08/20 0404  WBC 6.8 10.6*  --   NEUTROABS  --  8.5*  --   HGB 12.1 11.1* 12.6  HCT 39.0 34.2* 37.0  MCV 75.0* 72.9*  --   PLT 314 293  --    Cardiac Enzymes: No results for input(s): CKTOTAL, CKMB, CKMBINDEX, TROPONINI in the last 168 hours. BNP: BNP (last 3 results) No results for input(s): BNP in the last 8760 hours.  ProBNP (last 3 results) No results for input(s): PROBNP in the last 8760 hours.  CBG: Recent Labs  Lab 11/08/20 1518 11/08/20 1803 11/08/20 1945 11/08/20 2227 11/09/20 0636  GLUCAP 379* 282* 267* 180* 223*       Signed:  Malesha Suliman  [facility] 11/09/2020, 10:03 AM

## 2020-11-09 NOTE — Care Management Obs Status (Signed)
MEDICARE OBSERVATION STATUS NOTIFICATION   Patient Details  Name: Chelsea Lester MRN: [id number] Date of Birth: May 14, 1956   Medicare Observation Status Notification Given:  Yes    Leone Haven, RN 11/09/2020, 10:41 AM

## 2020-11-09 NOTE — Plan of Care (Signed)

## 2020-11-09 NOTE — Plan of Care (Signed)

## 2020-11-29 ENCOUNTER — Encounter: Payer: Self-pay | Admitting: Dietician

## 2020-11-29 ENCOUNTER — Encounter: Payer: Medicare Other | Attending: Dietician | Admitting: Dietician

## 2020-11-29 DIAGNOSIS — E1165 Type 2 diabetes mellitus with hyperglycemia: Secondary | ICD-10-CM | POA: Diagnosis present

## 2020-11-29 NOTE — Patient Instructions (Addendum)
Aim to walk daily.  Start with 15 minutes and increase slowly to 30 minutes as tolerated. When you can't walk outside, consider doing exercises on you tube such as "Sit and be Fit" or walking videos.  Your granddaughter could help you find these.  Continue to drink water and other beverages without sugar. Continue to take your medication and check your blood sugar daily.  Continue to avoid added salt and choose foods that are unprocessed or low sodium.  Continue to rinse any canned beans or vegetables.  Choose fresh or frozen vegetables (without salt) most often.  Bake, boil, or grill most often.  Limit added fat, choose lean meat and remove the skin from the chicken. Meat portion should be about the size of a deck of cards or have beans instead. Continue your salads for dinner.  Be mindful about the portion size of dressing that you use.  Please call for any questions or for an appointment.

## 2020-11-29 NOTE — Progress Notes (Addendum)
Diabetes Self-Management Education  Visit Type: First/Initial  Appt. Start Time: 1530 Appt. End Time: 1660 This visit was completed via telephone due to the COVID-19 pandemic.   I spoke with Chelsea Lester and verified that I was speaking with the correct person with two patient identifiers (full name and date of birth).   I discussed the limitations related to this kind of visit and the patient is willing to proceed. 11/29/2020  Ms. Chelsea Lester, identified by name and date of birth, is a 65 y.o. female with a diagnosis of Diabetes: Type 2.   ASSESSMENT Patient is here today with her husband and grandaughter. Patient states that she is to avoid salt and limit bread and pasta.  She states that she is doing better overall with her diet.  Patient was referred for Type 2 Diabetes (since age 34 per patient), obesity, pure hypercholesterolemia, GERD, HTN.  Other history includes hypothyroidism, iron deficiency anemia, asthma, OSA (no c-pap). Medications include:  Novolin 70/30 50 units before breakfast and before dinner (9 am and 5:30 pm), ozempic, metformin, glipizide A1C:  11.1% 11/08/2020 12.7% 05/2018  Wt Readings from Last 3 Encounters:  11/08/20 288 lb 2.3 oz (130.7 kg)  05/05/19 (!) 315 lb (142.9 kg)  05/17/18 (!) 308 lb 3.2 oz (139.8 kg)    Patient lives with her husband.  They share shopping and cooking. Avoids added salt. Height 5\' 7"  (1.702 m). Body mass index is 45.13 kg/m.   Diabetes Self-Management Education - 11/29/20 1543      Visit Information   Visit Type First/Initial      Initial Visit   Diabetes Type Type 2    Are you currently following a meal plan? No    Are you taking your medications as prescribed? Yes    Date Diagnosed [place]   How would you rate your overall health? Good      Psychosocial Assessment   Patient Belief/Attitude about Diabetes Motivated to manage diabetes    Self-care barriers None    Self-management support  Doctor's office    Other persons present Patient    Patient Concerns Nutrition/Meal planning    Special Needs None    Preferred Learning Style No preference indicated    Learning Readiness Ready    How often do you need to have someone help you when you read instructions, pamphlets, or other written materials from your doctor or pharmacy? 1 - Never    What is the last grade level you completed in school? 12      Pre-Education Assessment   Patient understands the diabetes disease and treatment process. Needs Review    Patient understands incorporating nutritional management into lifestyle. Needs Review    Patient undertands incorporating physical activity into lifestyle. Needs Review    Patient understands using medications safely. Needs Review    Patient understands monitoring blood glucose, interpreting and using results Needs Review    Patient understands prevention, detection, and treatment of acute complications. Needs Review    Patient understands prevention, detection, and treatment of chronic complications. Needs Review    Patient understands how to develop strategies to address psychosocial issues. Needs Review    Patient understands how to develop strategies to promote health/change behavior. Needs Review      Complications   Last HgB A1C per patient/outside source 11.1 %   11/08/2020 decreased from 12.7% 05/14/2018   How often do you check your blood sugar? 1-2  times/day    Fasting Blood glucose range (mg/dL) 70-129    Postprandial Blood glucose range (mg/dL) 70-129    Number of hypoglycemic episodes per month 0    Number of hyperglycemic episodes per week 0    Have you had a dilated eye exam in the past 12 months? Yes    Have you had a dental exam in the past 12 months? No   dentures   Are you checking your feet? Yes    How many days per week are you checking your feet? 7      Dietary Intake   Breakfast [place] bacon or sausage, egg, occasional cheese, instant grits,  occasional biscuit or toast    Snack (morning) none    Lunch chicken sandwich OR [place] sandwich on [employer] bread, occasional mayo, lunch    Snack (afternoon) occasional fruit    Dinner salad, [place], Pension scheme manager ranch OR beans, [place] necks, greens    Snack (evening) occasional 1/2 PB & jelly sandwich or [place] sandwich    Beverage(s) water, unsweetened tea with sweet and low, diet Mt. Dew      Exercise   Exercise Type Light (walking / raking leaves)    How many days per week to you exercise? 3    How many minutes per day do you exercise? 15    Total minutes per week of exercise 45      Patient Education   Previous Diabetes Education Yes (please comment)   "a long time ago"   Nutrition management  Role of diet in the treatment of diabetes and the relationship between the three main macronutrients and blood glucose level;Meal options for control of blood glucose level and chronic complications.    Physical activity and exercise  Role of exercise on diabetes management, blood pressure control and cardiac health.    Medications Reviewed patients medication for diabetes, action, purpose, timing of dose and side effects.    Monitoring Taught/discussed recording of test results and interpretation of SMBG.;Identified appropriate SMBG and/or A1C goals.;Daily foot exams;Yearly dilated eye exam    Acute complications Taught treatment of hypoglycemia - the 15 rule.    Chronic complications Relationship between chronic complications and blood glucose control      Individualized Goals (developed by patient)   Nutrition General guidelines for healthy choices and portions discussed    Physical Activity Exercise 5-7 days per week;15 minutes per day    Medications take my medication as prescribed    Monitoring  test my blood glucose as discussed    Reducing Risk examine blood glucose patterns;treat hypoglycemia with 15 grams of carbs if blood glucose less than 70mg /dL;increase portions of healthy fats       Post-Education Assessment   Patient understands the diabetes disease and treatment process. Demonstrates understanding / competency    Patient understands incorporating nutritional management into lifestyle. Needs Review    Patient undertands incorporating physical activity into lifestyle. Demonstrates understanding / competency    Patient understands using medications safely. Demonstrates understanding / competency    Patient understands monitoring blood glucose, interpreting and using results Needs Review    Patient understands prevention, detection, and treatment of acute complications. Demonstrates understanding / competency    Patient understands prevention, detection, and treatment of chronic complications. Demonstrates understanding / competency    Patient understands how to develop strategies to address psychosocial issues. Demonstrates understanding / competency    Patient understands how to develop strategies to promote health/change behavior. Needs Review  Outcomes   Expected Outcomes Demonstrated interest in learning. Expect positive outcomes    Future DMSE PRN    Program Status Completed           Individualized Plan for Diabetes Self-Management Training:   Learning Objective:  Patient will have a greater understanding of diabetes self-management. Patient education plan is to attend individual and/or group sessions per assessed needs and concerns.   Plan:   Patient Instructions  Aim to walk daily.  Start with 15 minutes and increase slowly to 30 minutes as tolerated. When you can't walk outside, consider doing exercises on you tube such as "Sit and be Fit" or walking videos.  Your granddaughter could help you find these.  Continue to drink water and other beverages without sugar. Continue to take your medication and check your blood sugar daily.  Continue to avoid added salt and choose foods that are unprocessed or low sodium.  Continue to rinse any canned beans or  vegetables.  Choose fresh or frozen vegetables (without salt) most often.  Bake, boil, or grill most often.  Limit added fat, choose lean meat and remove the skin from the chicken. Meat portion should be about the size of a deck of cards or have beans instead. Continue your salads for dinner.  Be mindful about the portion size of dressing that you use.  Please call for any questions or for an appointment.     Expected Outcomes:  Demonstrated interest in learning. Expect positive outcomes  Education material provided: ADA - How to Thrive: A Guide for Your Journey with Diabetes and My Plate  If problems or questions, patient to contact team via:  Phone  Future DSME appointment: PRN

## 2021-05-30 ENCOUNTER — Other Ambulatory Visit: Payer: Self-pay | Admitting: Nurse Practitioner

## 2021-05-30 DIAGNOSIS — Z1231 Encounter for screening mammogram for malignant neoplasm of breast: Secondary | ICD-10-CM

## 2021-07-20 ENCOUNTER — Ambulatory Visit: Payer: Medicare Other

## 2021-09-05 ENCOUNTER — Ambulatory Visit: Payer: Medicaid Other | Admitting: Endocrinology

## 2022-10-25 ENCOUNTER — Other Ambulatory Visit: Payer: Self-pay | Admitting: Internal Medicine

## 2022-10-25 DIAGNOSIS — Z1231 Encounter for screening mammogram for malignant neoplasm of breast: Secondary | ICD-10-CM

## 2022-12-12 ENCOUNTER — Other Ambulatory Visit: Payer: Self-pay | Admitting: Nurse Practitioner

## 2022-12-12 DIAGNOSIS — Z1231 Encounter for screening mammogram for malignant neoplasm of breast: Secondary | ICD-10-CM

## 2023-05-28 ENCOUNTER — Other Ambulatory Visit: Payer: Self-pay | Admitting: Oral Surgery

## 2023-05-28 DIAGNOSIS — M279 Disease of jaws, unspecified: Secondary | ICD-10-CM

## 2023-05-28 NOTE — Progress Notes (Unsigned)
Date: May 28, 2023   Patient: Chelsea Lester  PID: [id number]  DOB: 08/04/1956  SEX: Female   Patient referred by general dentist  for extraction teeth 22, 27.  CC: Pain lower right tooth.  Past Medical History:  Asthma, High Blood Pressure, Chest Pain or Angina, Irregular Heart Beat, Bronchitis, snoring/sleep apnea, Difficult breathing, Abnormal Bleeding, Liver Disease, Hepatitis, Thyroid trouble, Diabetes, Kidney Trouble, Joint Trouble, Arthritis, Swollen ankles, Stomach Ulcers, Immune System Problem, Delay in healing, Glaucoma, removable dental appliance, Dental anxiety   Medications: Famotidine, Pantoprazole, Magnesium, Farxiga, Metformin HCL, Albuterol, Advair, Ferrous sulfate, Sucralfate, Torsemide, Atenolol, Irbesartan, Potassium, Pravastatin, Amlodipine Besylate, Glipizide, Allopurinol, Tizanidine, Diclofenac, Oxycodone, Ozempic, Zolpidem, amoxicillin-pot clavulante    Allergies:     Percocet, Latex    Surgeries:   Stent placed         Social History       Smoking:            Alcohol: Drug use:                             Exam: Edentulous except for teeth # 22, 27. Decay tooth # 27, missing crown.   No purulence, edema, fluctuance, trismus. Oral cancer screening negative. Pharynx clear. No lymphadenopathy.  Panorex: 4 cm x 2 cm Ground glass/cotton wool appearance left  mandibular body above IAN from ramus to premolar area.  Assessment:  Non-restorable teeth #  22, 27. Differential diagnosis Condensing Osteitis, Cemento-osseous dysplasia, Padget's disease, Osteosarcoma.           Plan: 1. CT neck to evaluate mandible  2. Extract teeth # 22, 27,   Local/nitrous.                  Rx: none              Risks and complications explained. Questions answered.   Georgia Lopes, DMD

## 2023-06-25 ENCOUNTER — Ambulatory Visit (HOSPITAL_COMMUNITY): Payer: 59

## 2023-07-01 ENCOUNTER — Ambulatory Visit (HOSPITAL_COMMUNITY)
Admission: RE | Admit: 2023-07-01 | Discharge: 2023-07-01 | Disposition: A | Discharge status: Home / Self Care | Payer: 59 | Source: Ambulatory Visit | Attending: Oral Surgery | Admitting: Oral Surgery

## 2023-07-01 DIAGNOSIS — M279 Disease of jaws, unspecified: Secondary | ICD-10-CM | POA: Diagnosis present

## 2024-02-05 ENCOUNTER — Other Ambulatory Visit: Payer: Self-pay | Admitting: Nephrology

## 2024-02-05 DIAGNOSIS — N184 Chronic kidney disease, stage 4 (severe): Secondary | ICD-10-CM

## 2024-02-12 ENCOUNTER — Other Ambulatory Visit: Payer: Self-pay | Admitting: Gastroenterology

## 2024-02-12 DIAGNOSIS — Z1231 Encounter for screening mammogram for malignant neoplasm of breast: Secondary | ICD-10-CM

## 2024-02-24 ENCOUNTER — Ambulatory Visit
Admission: RE | Admit: 2024-02-24 | Discharge: 2024-02-24 | Disposition: A | Discharge status: Home / Self Care | Source: Ambulatory Visit | Attending: Nephrology | Admitting: Nephrology

## 2024-02-24 DIAGNOSIS — N184 Chronic kidney disease, stage 4 (severe): Secondary | ICD-10-CM

## 2024-04-09 ENCOUNTER — Inpatient Hospital Stay (HOSPITAL_COMMUNITY)
Admission: EM | Admit: 2024-04-09 | Discharge: 2024-04-13 | DRG: 372 | Disposition: A | Discharge status: Home / Self Care | Attending: Family Medicine | Admitting: Family Medicine

## 2024-04-09 ENCOUNTER — Encounter (HOSPITAL_COMMUNITY): Payer: Self-pay

## 2024-04-09 ENCOUNTER — Other Ambulatory Visit: Payer: Self-pay

## 2024-04-09 ENCOUNTER — Emergency Department (HOSPITAL_COMMUNITY)

## 2024-04-09 DIAGNOSIS — N179 Acute kidney failure, unspecified: Principal | ICD-10-CM | POA: Diagnosis present

## 2024-04-09 DIAGNOSIS — K828 Other specified diseases of gallbladder: Secondary | ICD-10-CM | POA: Diagnosis present

## 2024-04-09 DIAGNOSIS — R55 Syncope and collapse: Secondary | ICD-10-CM

## 2024-04-09 DIAGNOSIS — Z794 Long term (current) use of insulin: Secondary | ICD-10-CM

## 2024-04-09 DIAGNOSIS — I2510 Atherosclerotic heart disease of native coronary artery without angina pectoris: Secondary | ICD-10-CM | POA: Diagnosis present

## 2024-04-09 DIAGNOSIS — Z841 Family history of disorders of kidney and ureter: Secondary | ICD-10-CM

## 2024-04-09 DIAGNOSIS — E039 Hypothyroidism, unspecified: Secondary | ICD-10-CM | POA: Diagnosis present

## 2024-04-09 DIAGNOSIS — Z803 Family history of malignant neoplasm of breast: Secondary | ICD-10-CM

## 2024-04-09 DIAGNOSIS — R197 Diarrhea, unspecified: Secondary | ICD-10-CM

## 2024-04-09 DIAGNOSIS — D573 Sickle-cell trait: Secondary | ICD-10-CM | POA: Diagnosis present

## 2024-04-09 DIAGNOSIS — Z8249 Family history of ischemic heart disease and other diseases of the circulatory system: Secondary | ICD-10-CM

## 2024-04-09 DIAGNOSIS — E785 Hyperlipidemia, unspecified: Secondary | ICD-10-CM | POA: Diagnosis present

## 2024-04-09 DIAGNOSIS — Z7982 Long term (current) use of aspirin: Secondary | ICD-10-CM

## 2024-04-09 DIAGNOSIS — Z800 Family history of malignant neoplasm of digestive organs: Secondary | ICD-10-CM

## 2024-04-09 DIAGNOSIS — Z86718 Personal history of other venous thrombosis and embolism: Secondary | ICD-10-CM

## 2024-04-09 DIAGNOSIS — Z79899 Other long term (current) drug therapy: Secondary | ICD-10-CM

## 2024-04-09 DIAGNOSIS — Z7985 Long-term (current) use of injectable non-insulin antidiabetic drugs: Secondary | ICD-10-CM

## 2024-04-09 DIAGNOSIS — N189 Chronic kidney disease, unspecified: Secondary | ICD-10-CM | POA: Diagnosis present

## 2024-04-09 DIAGNOSIS — M109 Gout, unspecified: Secondary | ICD-10-CM | POA: Diagnosis present

## 2024-04-09 DIAGNOSIS — E119 Type 2 diabetes mellitus without complications: Secondary | ICD-10-CM

## 2024-04-09 DIAGNOSIS — A020 Salmonella enteritis: Secondary | ICD-10-CM | POA: Diagnosis not present

## 2024-04-09 DIAGNOSIS — I10 Essential (primary) hypertension: Secondary | ICD-10-CM | POA: Diagnosis present

## 2024-04-09 DIAGNOSIS — Z90710 Acquired absence of both cervix and uterus: Secondary | ICD-10-CM

## 2024-04-09 DIAGNOSIS — N1832 Chronic kidney disease, stage 3b: Secondary | ICD-10-CM | POA: Diagnosis present

## 2024-04-09 DIAGNOSIS — Z801 Family history of malignant neoplasm of trachea, bronchus and lung: Secondary | ICD-10-CM

## 2024-04-09 DIAGNOSIS — K8020 Calculus of gallbladder without cholecystitis without obstruction: Secondary | ICD-10-CM | POA: Diagnosis present

## 2024-04-09 DIAGNOSIS — E1165 Type 2 diabetes mellitus with hyperglycemia: Secondary | ICD-10-CM | POA: Diagnosis present

## 2024-04-09 DIAGNOSIS — M797 Fibromyalgia: Secondary | ICD-10-CM | POA: Diagnosis present

## 2024-04-09 DIAGNOSIS — Z8679 Personal history of other diseases of the circulatory system: Secondary | ICD-10-CM

## 2024-04-09 DIAGNOSIS — E1122 Type 2 diabetes mellitus with diabetic chronic kidney disease: Secondary | ICD-10-CM | POA: Diagnosis present

## 2024-04-09 DIAGNOSIS — J4489 Other specified chronic obstructive pulmonary disease: Secondary | ICD-10-CM | POA: Diagnosis present

## 2024-04-09 DIAGNOSIS — Z833 Family history of diabetes mellitus: Secondary | ICD-10-CM

## 2024-04-09 DIAGNOSIS — E860 Dehydration: Secondary | ICD-10-CM | POA: Diagnosis present

## 2024-04-09 DIAGNOSIS — K219 Gastro-esophageal reflux disease without esophagitis: Secondary | ICD-10-CM | POA: Diagnosis present

## 2024-04-09 DIAGNOSIS — N183 Chronic kidney disease, stage 3 unspecified: Secondary | ICD-10-CM | POA: Insufficient documentation

## 2024-04-09 DIAGNOSIS — F319 Bipolar disorder, unspecified: Secondary | ICD-10-CM | POA: Diagnosis present

## 2024-04-09 DIAGNOSIS — I252 Old myocardial infarction: Secondary | ICD-10-CM

## 2024-04-09 DIAGNOSIS — Z7984 Long term (current) use of oral hypoglycemic drugs: Secondary | ICD-10-CM

## 2024-04-09 DIAGNOSIS — I129 Hypertensive chronic kidney disease with stage 1 through stage 4 chronic kidney disease, or unspecified chronic kidney disease: Secondary | ICD-10-CM | POA: Diagnosis present

## 2024-04-09 DIAGNOSIS — I959 Hypotension, unspecified: Secondary | ICD-10-CM | POA: Diagnosis present

## 2024-04-09 DIAGNOSIS — E669 Obesity, unspecified: Secondary | ICD-10-CM | POA: Diagnosis present

## 2024-04-09 LAB — URINALYSIS, ROUTINE W REFLEX MICROSCOPIC
Bilirubin Urine: NEGATIVE
Glucose, UA: >=500 mg/dL — AB
Hgb urine dipstick: NEGATIVE
Ketones, ur: NEGATIVE mg/dL
Leukocytes,Ua: NEGATIVE
Nitrite: NEGATIVE
Protein, ur: 30 mg/dL — AB
Specific Gravity, Urine: 1.013 (ref 1.005–1.030)
pH: 5 (ref 5.0–8.0)

## 2024-04-09 LAB — CBC
HCT: 37.4 % (ref 36.0–46.0)
Hemoglobin: 11.3 g/dL — ABNORMAL LOW (ref 12.0–15.0)
MCH: 24 pg — ABNORMAL LOW (ref 26.0–34.0)
MCHC: 30.2 g/dL (ref 30.0–36.0)
MCV: 79.4 fL — ABNORMAL LOW (ref 80.0–100.0)
Platelets: 336 10*3/uL (ref 150–400)
RBC: 4.71 MIL/uL (ref 3.87–5.11)
RDW: 16.2 % — ABNORMAL HIGH (ref 11.5–15.5)
WBC: 8.3 10*3/uL (ref 4.0–10.5)
nRBC: 0 % (ref 0.0–0.2)

## 2024-04-09 LAB — COMPREHENSIVE METABOLIC PANEL WITH GFR
ALT: 9 U/L (ref 0–44)
AST: 10 U/L — ABNORMAL LOW (ref 15–41)
Albumin: 4.7 g/dL (ref 3.5–5.0)
Alkaline Phosphatase: 31 U/L — ABNORMAL LOW (ref 38–126)
Anion gap: 10 (ref 5–15)
BUN: 85 mg/dL — ABNORMAL HIGH (ref 8–23)
CO2: 28 mmol/L (ref 22–32)
Calcium: 9.9 mg/dL (ref 8.9–10.3)
Chloride: 96 mmol/L — ABNORMAL LOW (ref 98–111)
Creatinine, Ser: 5.04 mg/dL — ABNORMAL HIGH (ref 0.44–1.00)
GFR, Estimated: 9 mL/min — ABNORMAL LOW (ref >60)
Glucose, Bld: 178 mg/dL — ABNORMAL HIGH (ref 70–99)
Potassium: 5.1 mmol/L (ref 3.5–5.1)
Sodium: 134 mmol/L — ABNORMAL LOW (ref 135–145)
Total Bilirubin: 0.4 mg/dL (ref 0.0–1.2)
Total Protein: 8.5 g/dL — ABNORMAL HIGH (ref 6.5–8.1)

## 2024-04-09 LAB — LIPASE, BLOOD: Lipase: 250 U/L — ABNORMAL HIGH (ref 11–51)

## 2024-04-09 LAB — TSH: TSH: 2.401 u[IU]/mL (ref 0.350–4.500)

## 2024-04-09 MED ORDER — LACTATED RINGERS IV BOLUS
1000.0000 mL | Freq: Once | INTRAVENOUS | Status: AC
  Administered 2024-04-09: 1000 mL via INTRAVENOUS

## 2024-04-09 MED ORDER — LACTATED RINGERS IV SOLN: INTRAVENOUS | Status: DC

## 2024-04-09 MED ORDER — MORPHINE SULFATE (PF) 4 MG/ML IV SOLN
4.0000 mg | Freq: Once | INTRAVENOUS | Status: AC
  Administered 2024-04-09: 4 mg via INTRAVENOUS
  Filled 2024-04-09: qty 1

## 2024-04-09 MED ORDER — ONDANSETRON HCL 4 MG/2ML IJ SOLN
4.0000 mg | Freq: Once | INTRAMUSCULAR | Status: AC
  Administered 2024-04-09: 4 mg via INTRAVENOUS
  Filled 2024-04-09: qty 2

## 2024-04-09 NOTE — ED Triage Notes (Signed)
 Pt came in for dizziness and falling. Pt also c/o abdominal pain and diarrhea. All of her symptoms started this weekend. Pt stated she hit the back of her head yesterday as well.

## 2024-04-09 NOTE — ED Provider Notes (Signed)
  Provider Note MRN:  [id number]  Arrival date & time: 04/10/24    ED Course and Medical Decision Making  Assumed care of patient at sign-out or upon transfer.  Recent diarrhea, dehydration, syncopal episode this evening, found to have significant acute kidney injury, plan is for admission.  .Critical Care  Performed by: Edson Graces, MD Authorized by: Edson Graces, MD   Critical care provider statement:    Critical care time (minutes):  32   Critical care was necessary to treat or prevent imminent or life-threatening deterioration of the following conditions:  Renal failure   Critical care was time spent personally by me on the following activities:  Development of treatment plan with patient or surrogate, discussions with consultants, evaluation of patient's response to treatment, examination of patient, ordering and review of laboratory studies, ordering and review of radiographic studies, ordering and performing treatments and interventions, pulse oximetry, re-evaluation of patient's condition and review of old charts   I assumed direction of critical care for this patient from another provider in my specialty: yes     Care discussed with: admitting provider     Final Clinical Impressions(s) / ED Diagnoses     ICD-10-CM   1. AKI (acute kidney injury) [facility])  N17.9       ED Discharge Orders     None       Discharge Instructions   None     Merrick Abe. Harless Lien, MD [facility] Emergency [facility] [email or web address]    Edson Graces, MD 04/10/24 [phone]

## 2024-04-09 NOTE — ED Provider Notes (Signed)
 [place] [facility] AT [facility] Provider Note  CSN: [id number] Arrival date & time: 04/09/24 1721  Chief Complaint(s) Fall, Dizziness, and Abdominal Pain  HPI Chelsea Lester is a 68 y.o. female with PMH HTN, iron  deficiency anemia, HLD, asthma, T2DM, CKD who presents emerged part for evaluation of abdominal pain, diarrhea, dizziness and a fall.  She states that over the last few days she has had multiple episodes of watery diarrhea more than 5 a day.  She started to feel dizzy and fell striking the back of her head on the ground.  Also endorsing persistent abdominal pain worse in the epigastrium.  Denies alcohol use.  Denies chest pain, shortness of breath, vomiting or other systemic symptoms.   Past Medical History Past Medical History:  Diagnosis Date   Anemia    Anginal pain (HCC)    LAST CP TODAY   Anxiety    Asthma    Bipolar disorder (HCC)    Blood dyscrasia    Cataract    Chronic bronchitis (HCC)    "q other year" (05/14/2018)   Chronic lower back pain    Colon polyp    Complication of anesthesia    "couldn't get me woke when they put me to sleep for my teeth"   Dental caries    periodontitis, lesion left mandible   Depression 11/07   Hospitalization required   Diverticulosis    DVT (deep venous thrombosis) (HCC)    "LLE"   Dyspnea    W/ EXERTION       Esophageal stricture    Fibromyalgia    GERD (gastroesophageal reflux disease)    Gout    Headache    "weekly" (08/26/2015)     LITTLE BETTER  09/18/16   Hemorrhoids    History of hiatal hernia    Hyperlipidemia    Hypertension    IBS (irritable bowel syndrome)    Microcytosis    Myocardial infarction (HCC)    "slight one years ago"  Not clear where this information comes from--has had multiple stress tests and caths showing no CAD   Obesity    OSA on CPAP    CPAP AT NIGHT W/ O2 PRN   Osteoarthritis    "all over; joints" (05/14/2018)   Pneumonia "several times"   PONV  (postoperative nausea and vomiting)    Sickle-cell trait (HCC)    Sleep disorder    Spinal headache    Type II diabetes mellitus (HCC)    Patient Active Problem List   Diagnosis Date Noted   Syncope, vasovagal 04/10/2024   Diarrhea 04/10/2024   Cholelithiasis 04/10/2024   History of CAD (coronary artery disease) 04/10/2024   CKD (chronic kidney disease) stage 3, GFR 30-59 ml/min (HCC) 04/10/2024   Hyperosmolar non-ketotic state due to type 2 diabetes mellitus (HCC) 11/08/2020   Hypothyroidism 11/08/2020   Chest pain 06/23/2017   Left sided numbness 06/23/2017   Chest pain, precordial 09/18/2016    Class: Acute   Precordial chest pain 09/18/2016   Acute encephalopathy    Hyperglycemia 08/26/2015   Chronic high back pain 08/26/2015   Acute kidney injury superimposed on chronic kidney disease (HCC) 08/26/2015   Prolonged Q-T interval on ECG 08/26/2015   OSA (obstructive sleep apnea) 08/26/2015   Chest pain at rest 03/09/2014   Family history of breast cancer 03/23/2013   Family history of GI tract cancer 03/23/2013   Dyspnea 05/02/2012   Pulmonary infiltrates 05/02/2012   Cough 05/02/2012  Family history of malignant neoplasm of gastrointestinal tract 07/25/2011   Abdominal pain, epigastric 07/25/2011   UNSPECIFIED IRON  DEFICIENCY ANEMIA 03/23/2009   Arthritis, degenerative 01/05/2009   HEMORRHOIDS, INTERNAL 10/05/2008   RHINITIS 01/14/2008   HYPOTENSION 11/14/2007   CELLULITIS AND ABSCESS OF TRUNK 11/14/2007   HYPOKALEMIA 10/28/2007   UTI 10/28/2007   DIZZINESS 10/28/2007   Irritable bowel syndrome 08/13/2007   Exophthalmos 05/26/2007   BILIRUBINURIA 05/26/2007   DRUG ABUSE 04/14/2007   SINUSITIS, MAXILLARY, ACUTE 03/24/2007   ANXIETY DISORDER, GENERALIZED 03/04/2007   CALF PAIN 03/04/2007   BENIGN NEOPLASM OTH&UNSPEC SITE DIGESTIVE SYSTEM 01/17/2007   ESOPHAGEAL STRICTURE 01/17/2007   Hyperlipidemia 01/14/2007   INSOMNIA 12/03/2006   POLYP, COLON 09/12/2006    Uncontrolled diabetes mellitus 09/12/2006   Obesity, Class III, BMI 40-49.9 (morbid obesity) 09/12/2006   Asthma 09/12/2006   GERD (gastroesophageal reflux disease) 09/12/2006   OSTEOARTHRITIS 09/12/2006   MICROCYTOSIS 09/12/2006   HYSTERECTOMY, HX OF 09/12/2006   Essential hypertension 09/12/2006   Insulin  dependent type 2 diabetes mellitus (HCC) 09/12/2006   Home Medication(s) Prior to Admission medications   Medication Sig Start Date End Date Taking? Authorizing Provider  albuterol  (PROVENTIL ,VENTOLIN ) 90 MCG/ACT inhaler Inhale 2 puffs into the lungs every 6 (six) hours as needed for shortness of breath. Wheezing or shortness of breath    [provider]  allopurinol  (ZYLOPRIM ) 100 MG tablet Take 1 tablet (100 mg total) by mouth daily. Patient taking differently: Take 300 mg by mouth daily. 05/20/17   Ronalee Cocking, MD  amLODipine  (NORVASC ) 10 MG tablet Take 1 tablet (10 mg total) by mouth daily. 05/20/17   Ronalee Cocking, MD  Artificial Tear Ointment (DRY EYES OP) Apply 1 drop to eye daily as needed (for dry eyes).    [provider]  aspirin  81 MG tablet Take 81 mg by mouth every morning.    [provider]  cetirizine (ZYRTEC) 10 MG tablet Take 10 mg by mouth daily.    [provider]  COMBIGAN  0.2-0.5 % ophthalmic solution Place 1 drop into both eyes every 12 (twelve) hours. 05/20/17   Ronalee Cocking, MD  DEXILANT  60 MG capsule Take 60 mg by mouth daily. 10/06/20   [provider]  diclofenac  sodium (VOLTAREN ) 1 % GEL Apply 2 g topically 4 (four) times daily as needed (pain). 05/20/17   Ronalee Cocking, MD  docusate sodium  (COLACE) 100 MG capsule Take 1 capsule (100 mg total) by mouth daily. 05/20/17   Ronalee Cocking, MD  ergocalciferol  (VITAMIN D2) 50000 units capsule Take 1 capsule (50,000 Units total) by mouth once a week. Usually takes on Saturday or fridays Patient taking differently: Take 50,000 Units by mouth every  Friday. 05/20/17   Ronalee Cocking, MD  famotidine  (PEPCID ) 20 MG tablet Take 20 mg by mouth 2 (two) times daily. 10/06/20   [provider]  fenofibrate  micronized (LOFIBRA) 200 MG capsule Take 200 mg by mouth at bedtime. 10/24/20   [provider]  ferrous sulfate  325 (65 FE) MG tablet Take 1 tablet (325 mg total) by mouth daily with breakfast. 05/17/18   Pasqual Bone, MD  fluticasone  (FLONASE ) 50 MCG/ACT nasal spray 2 sprays each nostril once daily Patient not taking: Reported on 11/29/2020 05/20/17   Ronalee Cocking, MD  fluticasone -salmeterol (ADVAIR HFA) 115-21 MCG/ACT inhaler Inhale 2 puffs into the lungs 2 (two) times daily. 05/20/17   Ronalee Cocking, MD  furosemide  (LASIX ) 20 MG tablet Take 20 mg by mouth daily. 10/27/20   [provider]  gabapentin  (NEURONTIN ) 600 MG tablet Take 600 mg by mouth 3 (three) times daily.    [provider]  glipiZIDE (GLUCOTROL) 5 MG tablet Take 5 mg by mouth daily before breakfast.    [provider]  hydrochlorothiazide (HYDRODIURIL) 25 MG tablet Take 50 mg by mouth daily. 05/01/18   [provider]  HYDROcodone -acetaminophen  (NORCO) 10-325 MG tablet Take 1 tablet by mouth every 6 (six) hours as needed for pain. 03/07/18   [provider]  insulin  NPH-regular Human (NOVOLIN  70/30) (70-30) 100 UNIT/ML injection Inject 60 units BID 11/09/20   Mikhail, Maryann, DO  ipratropium (ATROVENT ) 0.02 % nebulizer solution Take 2.5 mLs (0.5 mg total) by nebulization 3 (three) times daily. Nebulize 0.5 mg three times daily Patient taking differently: Take 0.5 mg by nebulization 3 (three) times daily. 05/20/17   Ronalee Cocking, MD  Lancets MISC Check sugars twice daily before meals 05/20/17   Ronalee Cocking, MD  Latanoprostene Bunod  0.024 % SOLN Apply 1 drop to eye 2 (two) times daily.     [provider]  Magnesium  Oxide -Mg Supplement 250 MG TABS Take 1 tablet by mouth daily. 04/14/18    [provider]  metFORMIN  (GLUCOPHAGE ) 500 MG tablet Take 1 tablet (500 mg total) by mouth 2 (two) times daily with a meal. No metformin  for 2 days then resume. Patient taking differently: Take 1,000 mg by mouth daily with breakfast. 05/17/18   Pasqual Bone, MD  metoprolol  tartrate (LOPRESSOR ) 50 MG tablet take 1 tablet by mouth twice a day Patient taking differently: Take 50 mg by mouth 2 (two) times daily. 07/05/17   Ronalee Cocking, MD  nitroGLYCERIN  (NITROSTAT ) 0.4 MG SL tablet Place 0.4 mg under the tongue every 5 (five) minutes as needed for chest pain (max 3 doses). Patient not taking: Reported on 11/29/2020 09/07/16   [provider]  ofloxacin (OCUFLOX) 0.3 % ophthalmic solution Place 1 drop into the right eye 4 (four) times daily. 09/16/20   [provider]  oxyCODONE (OXY IR/ROXICODONE) 5 MG immediate release tablet Take 10 mg by mouth every 4 (four) hours.    [provider]  OZEMPIC, 1 MG/DOSE, 2 MG/1.5ML SOPN Inject 1 mg into the skin every Saturday. 08/19/20   [provider]  polyethylene glycol (MIRALAX  / GLYCOLAX ) packet Take 17 g by mouth daily. Patient taking differently: Take 17 g by mouth daily as needed for mild constipation. 05/20/17   Ronalee Cocking, MD  potassium chloride  (K-DUR) 10 MEQ tablet Take 1 tablet (10 mEq total) by mouth 2 (two) times daily. Patient taking differently: Take 20 mEq by mouth daily. 05/17/18   Pasqual Bone, MD  pravastatin (PRAVACHOL) 40 MG tablet Take 40 mg by mouth at bedtime. 10/24/20   [provider]  promethazine  (PHENERGAN ) 25 MG tablet take 1/2 tablet by mouth every 6 hours if needed for nausea and vomiting Patient taking differently: Take 25 mg by mouth every 6 (six) hours as needed for nausea or vomiting. 07/22/17   Ronalee Cocking, MD  sucralfate (CARAFATE) 1 g tablet Take 1 g by mouth 2 (two) times daily.    [provider]  tiZANidine (ZANAFLEX) 4 MG capsule Take 4  mg by mouth 3 (three) times daily as needed for spasms. 02/17/19   [provider]  traZODone (DESYREL) 100 MG tablet Take 100 mg by mouth at bedtime. 02/05/18   [provider]  triamcinolone (KENALOG) 0.1 % Apply 1 application topically 2 (two) times daily. 10/27/20  [provider]  zolpidem (AMBIEN) 10 MG tablet Take 10 mg by mouth at bedtime.    [provider]                                                                                                                                    Past Surgical History Past Surgical History:  Procedure Laterality Date   ABDOMINAL HYSTERECTOMY     CARDIAC CATHETERIZATION N/A 09/20/2016   Procedure: Left Heart Cath and Coronary Angiography;  Surgeon: Pasqual Bone, MD;  Location: [facility] IN[facility];  Service: Cardiovascular;  Laterality: N/A; Normal coronaries   COLONOSCOPY     DILATION AND CURETTAGE OF UTERUS     ESOPHAGOGASTRODUODENOSCOPY (EGD) WITH ESOPHAGEAL DILATION  "all the time"   ESOPHAGOGASTRODUODENOSCOPY (EGD) WITH ESOPHAGEAL DILATION     "a whole lot of times" (05/14/2018)   EYE SURGERY Bilateral    "had surgery on the sides of my eyes to keep them popping out"   LEFT HEART CATH AND CORONARY ANGIOGRAPHY N/A 05/16/2018   Procedure: LEFT HEART CATH AND CORONARY ANGIOGRAPHY;  Surgeon: Pasqual Bone, MD;  Location: [facility] CV [facility];  Service: Cardiovascular;  Laterality: N/A;   MULTIPLE EXTRACTIONS WITH ALVEOLOPLASTY N/A 10/19/2016   Procedure: MULTIPLE EXTRACTION WITH ALVEOLOPLASTY;  Surgeon: Ascencion Lava, DDS;  Location: [facility] OR;  Service: Oral Surgery;  Laterality: N/A;   MULTIPLE TOOTH EXTRACTIONS  ?1980's   "& cut into my jaw"   TONSILLECTOMY     TUBAL LIGATION     UPPER GASTROINTESTINAL ENDOSCOPY     Family History Family History  Problem Relation Age of Onset   Rectal cancer Sister    Lung cancer Father    Esophageal cancer Father    Breast cancer Sister 39   Breast cancer Mother 32   Colon  cancer Other        Aunt, Uncle   Liver cancer Brother        Nephew   Diabetes Sister    Diabetes Brother    Diabetes Other        son & daughter   Heart disease Sister    Heart disease Brother    Kidney disease Sister    Breast cancer Maternal Aunt        patient reports 11 mat aunts with breast cancer   Breast cancer Paternal Aunt 32   Breast cancer Maternal Grandmother        unk age    Social History Social History   Tobacco Use   Smoking status: Never   Smokeless tobacco: Never  Vaping Use   Vaping status: Never Used  Substance Use Topics   Alcohol use: No   Drug use: Yes    Types: Hydrocodone    Allergies Suboxone [buprenorphine hcl-naloxone hcl], Ace inhibitors, Latex, and Percocet [oxycodone-acetaminophen ]  Review of Systems Review of Systems  Gastrointestinal:  Positive for abdominal pain and diarrhea.  Neurological:  Positive for light-headedness.    Physical Exam Vital Signs  I have reviewed the triage vital signs BP 109/81   Pulse 78   Temp 98.7 F (37.1 C)   Resp 17   SpO2 100%   Physical Exam Vitals and nursing note reviewed.  Constitutional:      General: She is not in acute distress.    Appearance: She is well-developed.  HENT:     Head: Normocephalic and atraumatic.  Eyes:     Conjunctiva/sclera: Conjunctivae normal.  Cardiovascular:     Rate and Rhythm: Normal rate and regular rhythm.     Heart sounds: No murmur heard. Pulmonary:     Effort: Pulmonary effort is normal. No respiratory distress.     Breath sounds: Normal breath sounds.  Abdominal:     Palpations: Abdomen is soft.     Tenderness: There is abdominal tenderness in the epigastric area.  Musculoskeletal:        General: No swelling.     Cervical back: Neck supple.  Skin:    General: Skin is warm and dry.     Capillary Refill: Capillary refill takes less than 2 seconds.  Neurological:     Mental Status: She is alert.  Psychiatric:        Mood and Affect: Mood  normal.     ED Results and Treatments Labs (all labs ordered are listed, but only abnormal results are displayed) Labs Reviewed  LIPASE, BLOOD - Abnormal; Notable for the following components:      Result Value   Lipase 250 (*)    All other components within normal limits  COMPREHENSIVE METABOLIC PANEL WITH GFR - Abnormal; Notable for the following components:   Sodium 134 (*)    Chloride 96 (*)    Glucose, Bld 178 (*)    BUN 85 (*)    Creatinine, Ser 5.04 (*)    Total Protein 8.5 (*)    AST 10 (*)    Alkaline Phosphatase 31 (*)    GFR, Estimated 9 (*)    All other components within normal limits  CBC - Abnormal; Notable for the following components:   Hemoglobin 11.3 (*)    MCV 79.4 (*)    MCH 24.0 (*)    RDW 16.2 (*)    All other components within normal limits  URINALYSIS, ROUTINE W REFLEX MICROSCOPIC - Abnormal; Notable for the following components:   APPearance CLOUDY (*)    Glucose, UA >=500 (*)    Protein, ur 30 (*)    Bacteria, UA RARE (*)    All other components within normal limits  GASTROINTESTINAL PANEL BY PCR, STOOL (REPLACES STOOL CULTURE)  TSH  CREATININE, URINE, RANDOM  SODIUM, URINE, RANDOM  HIV ANTIBODY (ROUTINE TESTING W REFLEX)  MICROALBUMIN / CREATININE URINE RATIO  COMPREHENSIVE METABOLIC PANEL WITH GFR  CBC  HEMOGLOBIN A1C  Radiology CT HEAD WO CONTRAST ( ) Result Date: 04/09/2024 CLINICAL DATA:  Head trauma, minor (Age >= 65y) Dizziness, fall. EXAM: CT HEAD WITHOUT CONTRAST TECHNIQUE: Contiguous axial images were obtained from the base of the skull through the vertex without intravenous contrast. RADIATION DOSE REDUCTION: This exam was performed according to the departmental dose-optimization program which includes automated exposure control, adjustment of the mA and/or kV according to patient size and/or use of iterative  reconstruction technique. COMPARISON:  05/05/2019 FINDINGS: Brain: No intracranial hemorrhage, mass effect, or midline shift. No hydrocephalus. The basilar cisterns are patent. No evidence of territorial infarct or acute ischemia. No extra-axial or intracranial fluid collection. Vascular: Atherosclerosis of skullbase vasculature without hyperdense vessel or abnormal calcification. Skull: No fracture or focal lesion. Sinuses/Orbits: No acute findings. Chronic mucosal thickening of left side of sphenoid sinus. Other: No large scalp hematoma. IMPRESSION: No acute intracranial abnormality. No skull fracture. Electronically Signed   By: Chadwick Colonel M.D.   On: 04/09/2024 22:31   CT ABDOMEN PELVIS WO CONTRAST Result Date: 04/09/2024 CLINICAL DATA:  Epigastric pain.  Abdominal pain and diarrhea. EXAM: CT ABDOMEN AND PELVIS WITHOUT CONTRAST TECHNIQUE: Multidetector CT imaging of the abdomen and pelvis was performed following the standard protocol without IV contrast. RADIATION DOSE REDUCTION: This exam was performed according to the departmental dose-optimization program which includes automated exposure control, adjustment of the mA and/or kV according to patient size and/or use of iterative reconstruction technique. COMPARISON:  Renal ultrasound 02/24/2024. Upper GI 08/14/2019. CT abdomen and pelvis 04/10/2011 FINDINGS: Lower chest: Slight fibrosis in the lung bases. Hepatobiliary: No focal liver lesions. Gallbladder is contracted. Heterogeneous appearance of the gallbladder may represent non radiopaque stones or sludge. No inflammatory stranding. No bile duct dilatation. Pancreas: Unremarkable. No pancreatic ductal dilatation or surrounding inflammatory changes. Spleen: Normal in size without focal abnormality. Adrenals/Urinary Tract: Adrenal glands are unremarkable. Kidneys are normal, without renal calculi, focal lesion, or hydronephrosis. Bladder is unremarkable. Stomach/Bowel: Stomach is within normal limits.  Appendix appears normal. No evidence of bowel wall thickening, distention, or inflammatory changes. Vascular/Lymphatic: No significant vascular findings are present. No enlarged abdominal or pelvic lymph nodes. Reproductive: Status post hysterectomy. No adnexal masses. Other: No abdominal wall hernia or abnormality. No abdominopelvic ascites. Musculoskeletal: No acute or significant osseous findings. IMPRESSION: 1. Possible cholelithiasis or gallbladder sludge. No inflammatory stranding around the gallbladder. 2. No evidence of bowel obstruction or inflammation. 3. Minimal aortic atherosclerosis. Electronically Signed   By: Boyce Byes M.D.   On: 04/09/2024 22:29    Pertinent labs & imaging results that were available during my care of the patient were reviewed by me and considered in my medical decision making (see MDM for details).  Medications Ordered in ED Medications  lactated ringers  infusion ( Intravenous New Bag/Given 04/10/24 0110)  pravastatin (PRAVACHOL) tablet 40 mg (has no administration in time range)  aspirin  EC tablet 81 mg (has no administration in time range)  heparin  injection 5,000 Units (has no administration in time range)  sodium chloride  flush (NS) 0.9 % injection 3 mL (has no administration in time range)  sodium chloride  flush (NS) 0.9 % injection 3 mL (has no administration in time range)  0.9 %  sodium chloride  infusion (has no administration in time range)  ondansetron  (ZOFRAN ) tablet 4 mg (has no administration in time range)    Or  ondansetron  (ZOFRAN ) injection 4 mg (has no administration in time range)  insulin  aspart (novoLOG ) injection 0-6 Units (has no administration in time range)  insulin  aspart (novoLOG ) injection 0-5 Units (has no administration in time range)  albuterol  (PROVENTIL ) (2.5 MG/3ML) 0.083% nebulizer solution 2.5 mg (has no administration in time range)  morphine  (PF) 4 MG/ML injection 4 mg (4 mg Intravenous Given 04/09/24 2207)  ondansetron   (ZOFRAN ) injection 4 mg (4 mg Intravenous Given 04/09/24 2207)  lactated ringers  bolus 1,000 mL (0 mLs Intravenous Stopped 04/10/24 0005)                                                                                                                                     Procedures .Critical Care  Performed by: Karlyn Overman, MD Authorized by: Karlyn Overman, MD   Critical care provider statement:    Critical care time (minutes):  30   Critical care was necessary to treat or prevent imminent or life-threatening deterioration of the following conditions:  Dehydration and renal failure   Critical care was time spent personally by me on the following activities:  Development of treatment plan with patient or surrogate, discussions with consultants, evaluation of patient's response to treatment, examination of patient, ordering and review of laboratory studies, ordering and review of radiographic studies, ordering and performing treatments and interventions, pulse oximetry, re-evaluation of patient's condition and review of old charts   (including critical care time)  Medical Decision Making / ED Course   This patient presents to the ED for concern of dizziness, fall, abdominal pain, this involves an extensive number of treatment options, and is a complaint that carries with it a high risk of complications and morbidity.  The differential diagnosis includes GERD/gastritis, peptic ulcer disease, pancreatitis, gastroparesis, pneumonia, pleurisy, pericarditis, electrolyte abnormality, dehydration, head injury  MDM: Patient seen in the emergency room for evaluation of a fall.  Physical exam with tenderness in the epigastrium but is otherwise unremarkable.  No external signs of trauma seen over the chest, extremities or over the scalp.  Laboratory evaluation is concerning with a new significant BUN elevation to 85, creatinine 5.04.  I spoke with the on-call provider at [facility] who confirms  that in April 2025 her creatinine was 2.5.  Lipase also elevated to 50.  CT head reassuringly negative for acute traumatic injury.  CT abdomen pelvis with possible gallbladder sludge but is otherwise unremarkable.  Aggressive fluid resuscitation begun and patient will possible admission for severe AKI   Additional history obtained: -Additional history obtained from multiple family members -External records from outside source obtained and reviewed including: Chart review including previous notes, labs, imaging, consultation notes   Lab Tests: -I ordered, reviewed, and interpreted labs.   The pertinent results include:   Labs Reviewed  LIPASE, BLOOD - Abnormal; Notable for the following components:      Result Value   Lipase 250 (*)    All other components within normal limits  COMPREHENSIVE METABOLIC PANEL WITH GFR - Abnormal; Notable for the following components:   Sodium 134 (*)  Chloride 96 (*)    Glucose, Bld 178 (*)    BUN 85 (*)    Creatinine, Ser 5.04 (*)    Total Protein 8.5 (*)    AST 10 (*)    Alkaline Phosphatase 31 (*)    GFR, Estimated 9 (*)    All other components within normal limits  CBC - Abnormal; Notable for the following components:   Hemoglobin 11.3 (*)    MCV 79.4 (*)    MCH 24.0 (*)    RDW 16.2 (*)    All other components within normal limits  URINALYSIS, ROUTINE W REFLEX MICROSCOPIC - Abnormal; Notable for the following components:   APPearance CLOUDY (*)    Glucose, UA >=500 (*)    Protein, ur 30 (*)    Bacteria, UA RARE (*)    All other components within normal limits  GASTROINTESTINAL PANEL BY PCR, STOOL (REPLACES STOOL CULTURE)  TSH  CREATININE, URINE, RANDOM  SODIUM, URINE, RANDOM  HIV ANTIBODY (ROUTINE TESTING W REFLEX)  MICROALBUMIN / CREATININE URINE RATIO  COMPREHENSIVE METABOLIC PANEL WITH GFR  CBC  HEMOGLOBIN A1C      EKG   EKG Interpretation Date/Time:  Friday April 10 2024 00:42:42 EDT Ventricular Rate:  79 PR  Interval:  202 QRS Duration:  85 QT Interval:  355 QTC Calculation: 407 R Axis:   9  Text Interpretation: Sinus rhythm Abnormal R-wave progression, early transition Confirmed by Arriyanna Mersch ([id number]) on 04/10/2024 1:37:42 AM         Imaging Studies ordered: I ordered imaging studies including CT head, CT abdomen pelvis I independently visualized and interpreted imaging. I agree with the radiologist interpretation   Medicines ordered and prescription drug management: Meds ordered this encounter  Medications   morphine  (PF) 4 MG/ML injection 4 mg   ondansetron  (ZOFRAN ) injection 4 mg   lactated ringers  bolus 1,000 mL   DISCONTD: lactated ringers  infusion   lactated ringers  infusion   pravastatin (PRAVACHOL) tablet 40 mg   aspirin  EC tablet 81 mg   DISCONTD: albuterol  (PROVENTIL ,VENTOLIN ) inhaler 2 puff    Wheezing or shortness of breath     heparin  injection 5,000 Units   sodium chloride  flush (NS) 0.9 % injection 3 mL   sodium chloride  flush (NS) 0.9 % injection 3 mL   0.9 %  sodium chloride  infusion   OR Linked Order Group    ondansetron  (ZOFRAN ) tablet 4 mg    ondansetron  (ZOFRAN ) injection 4 mg   insulin  aspart (novoLOG ) injection 0-6 Units    Correction coverage::   Very Sensitive (ESRD/Dialysis)    CBG < 70::   Implement Hypoglycemia Standing Orders and refer to Hypoglycemia Standing Orders sidebar report    CBG 70 - 120::   0 units    CBG 121 - 150::   0 units    CBG 151 - 200::   1 unit    CBG 201-250::   2 units    CBG 251-300::   3 units    CBG 301-350::   4 units    CBG 351-400::   5 units    CBG > 400:   Give 6 units and call MD   insulin  aspart (novoLOG ) injection 0-5 Units    Correction coverage::   HS scale    CBG < 70::   Implement Hypoglycemia Standing Orders and refer to Hypoglycemia Standing Orders sidebar report    CBG 70 - 120::   0 units    CBG 121 - 150::  0 units    CBG 151 - 200::   0 units    CBG 201 - 250::   2 units    CBG 251 - 300::    3 units    CBG 301 - 350::   4 units    CBG 351 - 400::   5 units    CBG > 400:   call MD and obtain STAT lab verification   albuterol  (PROVENTIL ) (2.5 MG/3ML) 0.083% nebulizer solution 2.5 mg    -I have reviewed the patients home medicines and have made adjustments as needed  Critical interventions Fluid resuscitation  Consultations Obtained: I requested consultation with the on-call provider at [facility],  and discussed lab and imaging findings as well as pertinent plan - they recommend: Previous creatinine 2.5   Cardiac Monitoring: The patient was maintained on a cardiac monitor.  I personally viewed and interpreted the cardiac monitored which showed an underlying rhythm of: NSR  Social Determinants of Health:  Factors impacting patients care include: none   Reevaluation: After the interventions noted above, I reevaluated the patient and found that they have :improved  Co morbidities that complicate the patient evaluation  Past Medical History:  Diagnosis Date   Anemia    Anginal pain (HCC)    LAST CP TODAY   Anxiety    Asthma    Bipolar disorder (HCC)    Blood dyscrasia    Cataract    Chronic bronchitis (HCC)    "q other year" (05/14/2018)   Chronic lower back pain    Colon polyp    Complication of anesthesia    "couldn't get me woke when they put me to sleep for my teeth"   Dental caries    periodontitis, lesion left mandible   Depression 11/07   Hospitalization required   Diverticulosis    DVT (deep venous thrombosis) (HCC)    "LLE"   Dyspnea    W/ EXERTION       Esophageal stricture    Fibromyalgia    GERD (gastroesophageal reflux disease)    Gout    Headache    "weekly" (08/26/2015)     LITTLE BETTER  09/18/16   Hemorrhoids    History of hiatal hernia    Hyperlipidemia    Hypertension    IBS (irritable bowel syndrome)    Microcytosis    Myocardial infarction (HCC)    "slight one years ago"  Not clear where this information comes  from--has had multiple stress tests and caths showing no CAD   Obesity    OSA on CPAP    CPAP AT NIGHT W/ O2 PRN   Osteoarthritis    "all over; joints" (05/14/2018)   Pneumonia "several times"   PONV (postoperative nausea and vomiting)    Sickle-cell trait (HCC)    Sleep disorder    Spinal headache    Type II diabetes mellitus (HCC)       Dispostion: I considered admission for this patient, and patient will require hospital admission for severe AKI     Final Clinical Impression(s) / ED Diagnoses Final diagnoses:  AKI (acute kidney injury) [facility])     @PCDICTATION @    Karlyn Overman, MD 04/10/24 [id number]

## 2024-04-10 ENCOUNTER — Other Ambulatory Visit (HOSPITAL_COMMUNITY): Payer: Self-pay

## 2024-04-10 ENCOUNTER — Encounter (HOSPITAL_COMMUNITY): Payer: Self-pay | Admitting: Internal Medicine

## 2024-04-10 ENCOUNTER — Telehealth (HOSPITAL_COMMUNITY): Payer: Self-pay | Admitting: Pharmacy Technician

## 2024-04-10 ENCOUNTER — Inpatient Hospital Stay (HOSPITAL_COMMUNITY)

## 2024-04-10 DIAGNOSIS — N183 Chronic kidney disease, stage 3 unspecified: Secondary | ICD-10-CM | POA: Insufficient documentation

## 2024-04-10 DIAGNOSIS — N1832 Chronic kidney disease, stage 3b: Secondary | ICD-10-CM | POA: Diagnosis present

## 2024-04-10 DIAGNOSIS — Z79899 Other long term (current) drug therapy: Secondary | ICD-10-CM | POA: Diagnosis not present

## 2024-04-10 DIAGNOSIS — E860 Dehydration: Secondary | ICD-10-CM | POA: Diagnosis present

## 2024-04-10 DIAGNOSIS — E785 Hyperlipidemia, unspecified: Secondary | ICD-10-CM | POA: Diagnosis present

## 2024-04-10 DIAGNOSIS — Z794 Long term (current) use of insulin: Secondary | ICD-10-CM

## 2024-04-10 DIAGNOSIS — R197 Diarrhea, unspecified: Secondary | ICD-10-CM

## 2024-04-10 DIAGNOSIS — A020 Salmonella enteritis: Secondary | ICD-10-CM | POA: Diagnosis present

## 2024-04-10 DIAGNOSIS — E119 Type 2 diabetes mellitus without complications: Secondary | ICD-10-CM

## 2024-04-10 DIAGNOSIS — I2510 Atherosclerotic heart disease of native coronary artery without angina pectoris: Secondary | ICD-10-CM | POA: Diagnosis present

## 2024-04-10 DIAGNOSIS — Z7982 Long term (current) use of aspirin: Secondary | ICD-10-CM | POA: Diagnosis not present

## 2024-04-10 DIAGNOSIS — D573 Sickle-cell trait: Secondary | ICD-10-CM | POA: Diagnosis present

## 2024-04-10 DIAGNOSIS — K219 Gastro-esophageal reflux disease without esophagitis: Secondary | ICD-10-CM | POA: Diagnosis present

## 2024-04-10 DIAGNOSIS — E669 Obesity, unspecified: Secondary | ICD-10-CM | POA: Diagnosis present

## 2024-04-10 DIAGNOSIS — Z8679 Personal history of other diseases of the circulatory system: Secondary | ICD-10-CM

## 2024-04-10 DIAGNOSIS — J4489 Other specified chronic obstructive pulmonary disease: Secondary | ICD-10-CM | POA: Diagnosis present

## 2024-04-10 DIAGNOSIS — N179 Acute kidney failure, unspecified: Secondary | ICD-10-CM | POA: Diagnosis present

## 2024-04-10 DIAGNOSIS — I959 Hypotension, unspecified: Secondary | ICD-10-CM | POA: Diagnosis present

## 2024-04-10 DIAGNOSIS — R55 Syncope and collapse: Secondary | ICD-10-CM

## 2024-04-10 DIAGNOSIS — K8050 Calculus of bile duct without cholangitis or cholecystitis without obstruction: Secondary | ICD-10-CM

## 2024-04-10 DIAGNOSIS — Z7984 Long term (current) use of oral hypoglycemic drugs: Secondary | ICD-10-CM | POA: Diagnosis not present

## 2024-04-10 DIAGNOSIS — F319 Bipolar disorder, unspecified: Secondary | ICD-10-CM | POA: Diagnosis present

## 2024-04-10 DIAGNOSIS — E039 Hypothyroidism, unspecified: Secondary | ICD-10-CM | POA: Diagnosis present

## 2024-04-10 DIAGNOSIS — A09 Infectious gastroenteritis and colitis, unspecified: Secondary | ICD-10-CM | POA: Diagnosis not present

## 2024-04-10 DIAGNOSIS — E1165 Type 2 diabetes mellitus with hyperglycemia: Secondary | ICD-10-CM | POA: Diagnosis present

## 2024-04-10 DIAGNOSIS — M797 Fibromyalgia: Secondary | ICD-10-CM | POA: Diagnosis present

## 2024-04-10 DIAGNOSIS — I252 Old myocardial infarction: Secondary | ICD-10-CM | POA: Diagnosis not present

## 2024-04-10 DIAGNOSIS — Z8249 Family history of ischemic heart disease and other diseases of the circulatory system: Secondary | ICD-10-CM | POA: Diagnosis not present

## 2024-04-10 DIAGNOSIS — Z7985 Long-term (current) use of injectable non-insulin antidiabetic drugs: Secondary | ICD-10-CM | POA: Diagnosis not present

## 2024-04-10 DIAGNOSIS — K8020 Calculus of gallbladder without cholecystitis without obstruction: Secondary | ICD-10-CM | POA: Insufficient documentation

## 2024-04-10 DIAGNOSIS — I129 Hypertensive chronic kidney disease with stage 1 through stage 4 chronic kidney disease, or unspecified chronic kidney disease: Secondary | ICD-10-CM | POA: Diagnosis present

## 2024-04-10 DIAGNOSIS — N189 Chronic kidney disease, unspecified: Secondary | ICD-10-CM

## 2024-04-10 DIAGNOSIS — E1122 Type 2 diabetes mellitus with diabetic chronic kidney disease: Secondary | ICD-10-CM | POA: Diagnosis present

## 2024-04-10 LAB — HIV ANTIBODY (ROUTINE TESTING W REFLEX): HIV Screen 4th Generation wRfx: NONREACTIVE

## 2024-04-10 LAB — CBC
HCT: 31.6 % — ABNORMAL LOW (ref 36.0–46.0)
Hemoglobin: 9.8 g/dL — ABNORMAL LOW (ref 12.0–15.0)
MCH: 23.8 pg — ABNORMAL LOW (ref 26.0–34.0)
MCHC: 31 g/dL (ref 30.0–36.0)
MCV: 76.9 fL — ABNORMAL LOW (ref 80.0–100.0)
Platelets: 270 10*3/uL (ref 150–400)
RBC: 4.11 MIL/uL (ref 3.87–5.11)
RDW: 15.9 % — ABNORMAL HIGH (ref 11.5–15.5)
WBC: 6.9 10*3/uL (ref 4.0–10.5)
nRBC: 0 % (ref 0.0–0.2)

## 2024-04-10 LAB — GLUCOSE, CAPILLARY
Glucose-Capillary: 125 mg/dL — ABNORMAL HIGH (ref 70–99)
Glucose-Capillary: 214 mg/dL — ABNORMAL HIGH (ref 70–99)

## 2024-04-10 LAB — CBG MONITORING, ED
Glucose-Capillary: 137 mg/dL — ABNORMAL HIGH (ref 70–99)
Glucose-Capillary: 65 mg/dL — ABNORMAL LOW (ref 70–99)
Glucose-Capillary: 179 mg/dL — ABNORMAL HIGH (ref 70–99)

## 2024-04-10 LAB — COMPREHENSIVE METABOLIC PANEL WITH GFR
ALT: 8 U/L (ref 0–44)
AST: 9 U/L — ABNORMAL LOW (ref 15–41)
Albumin: 3.8 g/dL (ref 3.5–5.0)
Alkaline Phosphatase: 28 U/L — ABNORMAL LOW (ref 38–126)
Anion gap: 9 (ref 5–15)
BUN: 79 mg/dL — ABNORMAL HIGH (ref 8–23)
CO2: 29 mmol/L (ref 22–32)
Calcium: 9.7 mg/dL (ref 8.9–10.3)
Chloride: 98 mmol/L (ref 98–111)
Creatinine, Ser: 4.71 mg/dL — ABNORMAL HIGH (ref 0.44–1.00)
GFR, Estimated: 10 mL/min — ABNORMAL LOW (ref >60)
Glucose, Bld: 83 mg/dL (ref 70–99)
Potassium: 4 mmol/L (ref 3.5–5.1)
Sodium: 136 mmol/L (ref 135–145)
Total Bilirubin: 0.4 mg/dL (ref 0.0–1.2)
Total Protein: 6.9 g/dL (ref 6.5–8.1)

## 2024-04-10 LAB — CREATININE, URINE, RANDOM: Creatinine, Urine: 66 mg/dL

## 2024-04-10 LAB — HEMOGLOBIN A1C
Hgb A1c MFr Bld: 7.6 % — ABNORMAL HIGH (ref 4.8–5.6)
Mean Plasma Glucose: 171.42 mg/dL

## 2024-04-10 LAB — C DIFFICILE QUICK SCREEN W PCR REFLEX
C Diff antigen: NEGATIVE
C Diff interpretation: NOT DETECTED
C Diff toxin: NEGATIVE

## 2024-04-10 LAB — SODIUM, URINE, RANDOM: Sodium, Ur: 67 mmol/L

## 2024-04-10 MED ORDER — ALBUTEROL SULFATE (2.5 MG/3ML) 0.083% IN NEBU: 2.5000 mg | INHALATION_SOLUTION | Freq: Four times a day (QID) | RESPIRATORY_TRACT | Status: DC | PRN

## 2024-04-10 MED ORDER — ASPIRIN 81 MG PO TBEC
81.0000 mg | DELAYED_RELEASE_TABLET | Freq: Every day | ORAL | Status: DC
  Administered 2024-04-10 – 2024-04-13 (×4): 81 mg via ORAL
  Filled 2024-04-10 (×4): qty 1

## 2024-04-10 MED ORDER — LACTATED RINGERS IV SOLN: INTRAVENOUS | Status: AC

## 2024-04-10 MED ORDER — HEPARIN SODIUM (PORCINE) 5000 UNIT/ML IJ SOLN
5000.0000 [IU] | Freq: Three times a day (TID) | INTRAMUSCULAR | Status: DC
  Administered 2024-04-10 – 2024-04-13 (×9): 5000 [IU] via SUBCUTANEOUS
  Filled 2024-04-10 (×9): qty 1

## 2024-04-10 MED ORDER — ONDANSETRON HCL 4 MG/2ML IJ SOLN
4.0000 mg | Freq: Four times a day (QID) | INTRAMUSCULAR | Status: DC | PRN
  Administered 2024-04-11: 4 mg via INTRAVENOUS
  Filled 2024-04-10: qty 2

## 2024-04-10 MED ORDER — HYDROMORPHONE HCL 1 MG/ML IJ SOLN
0.5000 mg | INTRAMUSCULAR | Status: DC | PRN
  Administered 2024-04-10 – 2024-04-12 (×7): 0.5 mg via INTRAVENOUS
  Filled 2024-04-10 (×2): qty 0.5
  Filled 2024-04-10: qty 1
  Filled 2024-04-10 (×4): qty 0.5

## 2024-04-10 MED ORDER — ENSURE PLUS HIGH PROTEIN PO LIQD
237.0000 mL | Freq: Two times a day (BID) | ORAL | Status: DC
  Administered 2024-04-11 – 2024-04-13 (×6): 237 mL via ORAL

## 2024-04-10 MED ORDER — PRAVASTATIN SODIUM 40 MG PO TABS
40.0000 mg | ORAL_TABLET | Freq: Every day | ORAL | Status: DC
  Administered 2024-04-10 – 2024-04-12 (×3): 40 mg via ORAL
  Filled 2024-04-10 (×3): qty 1

## 2024-04-10 MED ORDER — INSULIN ASPART 100 UNIT/ML IJ SOLN
0.0000 [IU] | Freq: Three times a day (TID) | INTRAMUSCULAR | Status: DC
  Administered 2024-04-10 – 2024-04-12 (×5): 1 [IU] via SUBCUTANEOUS
  Administered 2024-04-13: 3 [IU] via SUBCUTANEOUS
  Filled 2024-04-10: qty 0.06

## 2024-04-10 MED ORDER — SODIUM CHLORIDE 0.9% FLUSH
3.0000 mL | INTRAVENOUS | Status: DC | PRN
Start: 2024-04-10 — End: 2024-04-13

## 2024-04-10 MED ORDER — ONDANSETRON HCL 4 MG PO TABS: 4.0000 mg | ORAL_TABLET | Freq: Four times a day (QID) | ORAL | Status: DC | PRN

## 2024-04-10 MED ORDER — ALBUTEROL 90 MCG/ACT IN AERS: 2.0000 | INHALATION_SPRAY | Freq: Four times a day (QID) | RESPIRATORY_TRACT | Status: DC | PRN

## 2024-04-10 MED ORDER — INSULIN ASPART 100 UNIT/ML IJ SOLN
0.0000 [IU] | Freq: Every day | INTRAMUSCULAR | Status: DC
  Administered 2024-04-10 – 2024-04-11 (×2): 2 [IU] via SUBCUTANEOUS
  Filled 2024-04-10: qty 0.05

## 2024-04-10 MED ORDER — SODIUM CHLORIDE 0.9 % IV SOLN: 250.0000 mL | INTRAVENOUS | Status: AC | PRN

## 2024-04-10 MED ORDER — SODIUM CHLORIDE 0.9% FLUSH
3.0000 mL | Freq: Two times a day (BID) | INTRAVENOUS | Status: DC
  Administered 2024-04-10 – 2024-04-13 (×6): 3 mL via INTRAVENOUS

## 2024-04-10 NOTE — H&P (Signed)
 History and Physical    Chelsea Lester NWG:[id number] DOB: [birth year] DOA: 04/09/2024  PCP: [facility]   Patient coming from: Home   Chief Complaint:  Chief Complaint  Patient presents with   Fall   Dizziness   Abdominal Pain   ED TRIAGE note:Pt came in for dizziness and falling. Pt also c/o abdominal pain and diarrhea. All of her symptoms started this weekend. Pt stated she hit the back of her head yesterday as well   HPI:  Chelsea Lester is a 68 y.o. female with medical history significant of history of CAD with abnormal stress test in 08/2022, CKD stage IIIb, insulin -dependent DM type II, hypothyroidism, GERD, essential hypertension and hyperlipidemia presented to the emergency department complaining about diarrhea, generalized abdominal pain and dizziness.  Patient reported persistent diarrhea for last several days. Patient reported generalized dizziness with ambulation and poor p.o. intake for last few days. Patient reported having 2-3 episodes of loose stool for last several days.  Denies any abdominal pain, nausea, vomiting, fever and chills.  Patient reported had not seen any primary care physician for last 2 to 3 years and currently not taking any medication at home except long-acting insulin  20 units daily.   ED Course:  At presentation to ED patient found hypotensive blood pressure 96/61 which has been improved to 109/81.  Otherwise hemodynamically stable. CMP showing low sodium 134, elevated creatinine 5.04, low chloride 96, elevated BUN 85, elevated protein 8.5, and low GFR 9.  Patient did not have any CMP over last 3 years.  It is unclear if patient has developed significant acute kidney injury versus progression of the chronic kidney disease. UA showing cloudy appearance, blood glucose 500, protein 30, rare bacteria.  No nitrate and leukocyte esterase.  No evidence of UTI. CBC unremarkable stable H&H 11.3 and 37.  Low MCV.  Normal WBC platelet  count. TSH within normal range. CT abdomen pelvis showing possible cholelithiasis or gallbladder/sludge.  No inflammatory stranding around gallbladder.  No evidence of small bowel obstruction or inflammation. CT head no acute intracranial abnormality.  In the ED patient has been given morphine  4 mg, Zofran  4 mg 1 L of LR bolus and currently on LR 125 cc/h.  Hospitalist has been consulted for management of acute kidney injury, diarrhea, essential finding of cholelithiasis, syncope in the setting of dehydration and diarrhea.     Significant labs in the ED: Lab Orders         Gastrointestinal Panel by PCR , Stool         C Difficile Quick Screen w PCR reflex         Lipase, blood         Comprehensive metabolic panel         CBC         Urinalysis, Routine w reflex microscopic -Urine, Clean Catch         TSH         Creatinine, urine, random         Sodium, urine, random         HIV Antibody (routine testing w rflx)         Microalbumin / creatinine urine ratio         Comprehensive metabolic panel         CBC         Hemoglobin A1c         CBG monitoring, ED       Review of Systems:  Review of Systems  Constitutional:  Negative for chills, fever, malaise/fatigue and weight loss.  Respiratory:  Negative for cough and sputum production.   Cardiovascular:  Negative for chest pain and palpitations.  Gastrointestinal:  Positive for diarrhea. Negative for abdominal pain, blood in stool, constipation, heartburn, melena, nausea and vomiting.  Genitourinary:  Negative for dysuria, frequency and urgency.  Musculoskeletal:  Positive for falls. Negative for back pain, joint pain, myalgias and neck pain.  Neurological:  Positive for dizziness. Negative for tingling, tremors, sensory change, focal weakness, loss of consciousness, weakness and headaches.  Psychiatric/Behavioral:  The patient is not nervous/anxious.     Past Medical History:  Diagnosis Date   Anemia    Anginal pain (HCC)     LAST CP TODAY   Anxiety    Asthma    Bipolar disorder (HCC)    Blood dyscrasia    Cataract    Chronic bronchitis (HCC)    "q other year" (05/14/2018)   Chronic lower back pain    Colon polyp    Complication of anesthesia    "couldn't get me woke when they put me to sleep for my teeth"   Dental caries    periodontitis, lesion left mandible   Depression 11/07   Hospitalization required   Diverticulosis    DVT (deep venous thrombosis) (HCC)    "LLE"   Dyspnea    W/ EXERTION       Esophageal stricture    Fibromyalgia    GERD (gastroesophageal reflux disease)    Gout    Headache    "weekly" (08/26/2015)     LITTLE BETTER  09/18/16   Hemorrhoids    History of hiatal hernia    Hyperlipidemia    Hypertension    IBS (irritable bowel syndrome)    Microcytosis    Myocardial infarction (HCC)    "slight one years ago"  Not clear where this information comes from--has had multiple stress tests and caths showing no CAD   Obesity    OSA on CPAP    CPAP AT NIGHT W/ O2 PRN   Osteoarthritis    "all over; joints" (05/14/2018)   Pneumonia "several times"   PONV (postoperative nausea and vomiting)    Sickle-cell trait (HCC)    Sleep disorder    Spinal headache    Type II diabetes mellitus (HCC)     Past Surgical History:  Procedure Laterality Date   ABDOMINAL HYSTERECTOMY     CARDIAC CATHETERIZATION N/A 09/20/2016   Procedure: Left Heart Cath and Coronary Angiography;  Surgeon: Pasqual Bone, MD;  Location: [facility];  Service: Cardiovascular;  Laterality: N/A; Normal coronaries   COLONOSCOPY     DILATION AND CURETTAGE OF UTERUS     ESOPHAGOGASTRODUODENOSCOPY (EGD) WITH ESOPHAGEAL DILATION  "all the time"   ESOPHAGOGASTRODUODENOSCOPY (EGD) WITH ESOPHAGEAL DILATION     "a whole lot of times" (05/14/2018)   EYE SURGERY Bilateral    "had surgery on the sides of my eyes to keep them popping out"   LEFT HEART CATH AND CORONARY ANGIOGRAPHY N/A 05/16/2018   Procedure: LEFT  HEART CATH AND CORONARY ANGIOGRAPHY;  Surgeon: Pasqual Bone, MD;  Location: [facility];  Service: Cardiovascular;  Laterality: N/A;   MULTIPLE EXTRACTIONS WITH ALVEOLOPLASTY N/A 10/19/2016   Procedure: MULTIPLE EXTRACTION WITH ALVEOLOPLASTY;  Surgeon: Ascencion Lava, DDS;  Location: [facility] OR;  Service: Oral Surgery;  Laterality: N/A;   MULTIPLE TOOTH EXTRACTIONS  ?1980's   "& cut into my jaw"  TONSILLECTOMY     TUBAL LIGATION     UPPER GASTROINTESTINAL ENDOSCOPY       reports that she has never smoked. She has never used smokeless tobacco. She reports current drug use. Drug: Hydrocodone . She reports that she does not drink alcohol.  Allergies  Allergen Reactions   Suboxone [Buprenorphine Hcl-Naloxone Hcl] Other (See Comments)    Itchy rash--difficult history.   Ace Inhibitors Cough   Latex Rash   Percocet [Oxycodone-Acetaminophen ] Itching         Family History  Problem Relation Age of Onset   Rectal cancer Sister    Lung cancer Father    Esophageal cancer Father    Breast cancer Sister 60   Breast cancer Mother 59   Colon cancer Other        Aunt, Uncle   Liver cancer Brother        Nephew   Diabetes Sister    Diabetes Brother    Diabetes Other        son & daughter   Heart disease Sister    Heart disease Brother    Kidney disease Sister    Breast cancer Maternal Aunt        patient reports 11 mat aunts with breast cancer   Breast cancer Paternal Aunt 32   Breast cancer Maternal Grandmother        unk age    Prior to Admission medications   Medication Sig Start Date End Date Taking? Authorizing Provider  albuterol  (PROVENTIL ,VENTOLIN ) 90 MCG/ACT inhaler Inhale 2 puffs into the lungs every 6 (six) hours as needed for shortness of breath. Wheezing or shortness of breath    [provider]  allopurinol  (ZYLOPRIM ) 100 MG tablet Take 1 tablet (100 mg total) by mouth daily. Patient taking differently: Take 300 mg by mouth daily. 05/20/17   Ronalee Cocking, MD  amLODipine  (NORVASC ) 10 MG tablet Take 1 tablet (10 mg total) by mouth daily. 05/20/17   Ronalee Cocking, MD  Artificial Tear Ointment (DRY EYES OP) Apply 1 drop to eye daily as needed (for dry eyes).    [provider]  aspirin  81 MG tablet Take 81 mg by mouth every morning.    [provider]  cetirizine (ZYRTEC) 10 MG tablet Take 10 mg by mouth daily.    [provider]  COMBIGAN  0.2-0.5 % ophthalmic solution Place 1 drop into both eyes every 12 (twelve) hours. 05/20/17   Ronalee Cocking, MD  DEXILANT  60 MG capsule Take 60 mg by mouth daily. 10/06/20   [provider]  diclofenac  sodium (VOLTAREN ) 1 % GEL Apply 2 g topically 4 (four) times daily as needed (pain). 05/20/17   Ronalee Cocking, MD  docusate sodium  (COLACE) 100 MG capsule Take 1 capsule (100 mg total) by mouth daily. 05/20/17   Ronalee Cocking, MD  ergocalciferol  (VITAMIN D2) 50000 units capsule Take 1 capsule (50,000 Units total) by mouth once a week. Usually takes on Saturday or fridays Patient taking differently: Take 50,000 Units by mouth every Friday. 05/20/17   Ronalee Cocking, MD  famotidine  (PEPCID ) 20 MG tablet Take 20 mg by mouth 2 (two) times daily. 10/06/20   [provider]  fenofibrate  micronized (LOFIBRA) 200 MG capsule Take 200 mg by mouth at bedtime. 10/24/20   [provider]  ferrous sulfate  325 (65 FE) MG tablet Take 1 tablet (325 mg total) by mouth daily with breakfast. 05/17/18   Pasqual Bone, MD  fluticasone  (FLONASE ) 50 MCG/ACT nasal spray  2 sprays each nostril once daily Patient not taking: Reported on 11/29/2020 05/20/17   Ronalee Cocking, MD  fluticasone -salmeterol (ADVAIR HFA) 115-21 MCG/ACT inhaler Inhale 2 puffs into the lungs 2 (two) times daily. 05/20/17   Ronalee Cocking, MD  furosemide  (LASIX ) 20 MG tablet Take 20 mg by mouth daily. 10/27/20   [provider]  gabapentin  (NEURONTIN ) 600 MG tablet Take 600  mg by mouth 3 (three) times daily.    [provider]  glipiZIDE (GLUCOTROL) 5 MG tablet Take 5 mg by mouth daily before breakfast.    [provider]  hydrochlorothiazide (HYDRODIURIL) 25 MG tablet Take 50 mg by mouth daily. 05/01/18   [provider]  HYDROcodone -acetaminophen  (NORCO) 10-325 MG tablet Take 1 tablet by mouth every 6 (six) hours as needed for pain. 03/07/18   [provider]  insulin  NPH-regular Human (NOVOLIN  70/30) (70-30) 100 UNIT/ML injection Inject 60 units BID 11/09/20   Mikhail, Maryann, DO  ipratropium (ATROVENT ) 0.02 % nebulizer solution Take 2.5 mLs (0.5 mg total) by nebulization 3 (three) times daily. Nebulize 0.5 mg three times daily Patient taking differently: Take 0.5 mg by nebulization 3 (three) times daily. 05/20/17   Ronalee Cocking, MD  Lancets MISC Check sugars twice daily before meals 05/20/17   Ronalee Cocking, MD  Latanoprostene Bunod  0.024 % SOLN Apply 1 drop to eye 2 (two) times daily.     [provider]  Magnesium  Oxide -Mg Supplement 250 MG TABS Take 1 tablet by mouth daily. 04/14/18   [provider]  metFORMIN  (GLUCOPHAGE ) 500 MG tablet Take 1 tablet (500 mg total) by mouth 2 (two) times daily with a meal. No metformin  for 2 days then resume. Patient taking differently: Take 1,000 mg by mouth daily with breakfast. 05/17/18   Pasqual Bone, MD  metoprolol  tartrate (LOPRESSOR ) 50 MG tablet take 1 tablet by mouth twice a day Patient taking differently: Take 50 mg by mouth 2 (two) times daily. 07/05/17   Ronalee Cocking, MD  nitroGLYCERIN  (NITROSTAT ) 0.4 MG SL tablet Place 0.4 mg under the tongue every 5 (five) minutes as needed for chest pain (max 3 doses). Patient not taking: Reported on 11/29/2020 09/07/16   [provider]  ofloxacin (OCUFLOX) 0.3 % ophthalmic solution Place 1 drop into the right eye 4 (four) times daily. 09/16/20   [provider]  oxyCODONE (OXY IR/ROXICODONE) 5 MG  immediate release tablet Take 10 mg by mouth every 4 (four) hours.    [provider]  OZEMPIC, 1 MG/DOSE, 2 MG/1.5ML SOPN Inject 1 mg into the skin every Saturday. 08/19/20   [provider]  polyethylene glycol (MIRALAX  / GLYCOLAX ) packet Take 17 g by mouth daily. Patient taking differently: Take 17 g by mouth daily as needed for mild constipation. 05/20/17   Ronalee Cocking, MD  potassium chloride  (K-DUR) 10 MEQ tablet Take 1 tablet (10 mEq total) by mouth 2 (two) times daily. Patient taking differently: Take 20 mEq by mouth daily. 05/17/18   Pasqual Bone, MD  pravastatin (PRAVACHOL) 40 MG tablet Take 40 mg by mouth at bedtime. 10/24/20   [provider]  promethazine  (PHENERGAN ) 25 MG tablet take 1/2 tablet by mouth every 6 hours if needed for nausea and vomiting Patient taking differently: Take 25 mg by mouth every 6 (six) hours as needed for nausea or vomiting. 07/22/17   Ronalee Cocking, MD  sucralfate (CARAFATE) 1 g tablet Take 1 g by mouth 2 (two) times daily.    [provider]  tiZANidine (ZANAFLEX) 4 MG capsule Take 4 mg by mouth 3 (three) times daily as needed for spasms. 02/17/19   [provider]  traZODone (DESYREL) 100 MG tablet Take 100 mg by mouth at bedtime. 02/05/18   [provider]  triamcinolone (KENALOG) 0.1 % Apply 1 application topically 2 (two) times daily. 10/27/20   [provider]  zolpidem (AMBIEN) 10 MG tablet Take 10 mg by mouth at bedtime.    [provider]     Physical Exam: Vitals:   04/09/24 2101 04/09/24 2206 04/09/24 2245 04/10/24 0000  BP: 96/61  93/61 109/81  Pulse: 71  71 78  Resp: 18 15 12 17   Temp:  98.8 F (37.1 C)  98.7 F (37.1 C)  TempSrc:  Oral    SpO2: 99%  100% 100%    Physical Exam Vitals and nursing note reviewed.  Constitutional:      Appearance: She is obese.  Cardiovascular:     Rate and Rhythm: Normal rate and regular rhythm.     Heart sounds:  Normal heart sounds.  Pulmonary:     Effort: Pulmonary effort is normal.     Breath sounds: Normal breath sounds.  Abdominal:     General: Bowel sounds are normal. There is no distension.     Palpations: Abdomen is soft.     Tenderness: There is no abdominal tenderness. There is no guarding or rebound.     Hernia: No hernia is present.  Skin:    General: Skin is dry.     Capillary Refill: Capillary refill takes less than 2 seconds.     Coloration: Skin is not jaundiced.  Neurological:     Mental Status: She is alert and oriented to person, place, and time.  Psychiatric:        Mood and Affect: Mood normal. Mood is not anxious.      Labs on Admission: I have personally reviewed following labs and imaging studies  CBC: Recent Labs  Lab 04/09/24 1944  WBC 8.3  HGB 11.3*  HCT 37.4  MCV 79.4*  PLT 336   Basic Metabolic Panel: Recent Labs  Lab 04/09/24 1944  NA 134*  K 5.1  CL 96*  CO2 28  GLUCOSE 178*  BUN 85*  CREATININE 5.04*  CALCIUM 9.9   GFR: CrCl cannot be calculated (Unknown ideal weight.). Liver Function Tests: Recent Labs  Lab 04/09/24 1944  AST 10*  ALT 9  ALKPHOS 31*  BILITOT 0.4  PROT 8.5*  ALBUMIN 4.7   Recent Labs  Lab 04/09/24 1944  LIPASE 250*   No results for input(s): "AMMONIA" in the last 168 hours. Coagulation Profile: No results for input(s): "INR", "PROTIME" in the last 168 hours. Cardiac Enzymes: No results for input(s): "CKTOTAL", "CKMB", "CKMBINDEX", "TROPONINI", "TROPONINIHS" in the last 168 hours. BNP (last 3 results) No results for input(s): "BNP" in the last 8760 hours. HbA1C: No results for input(s): "HGBA1C" in the last 72 hours. CBG: Recent Labs  Lab 04/10/24 0140  GLUCAP 137*   Lipid Profile: No results for input(s): "CHOL", "HDL", "LDLCALC", "TRIG", "CHOLHDL", "LDLDIRECT" in the last 72 hours. Thyroid  Function Tests: Recent Labs    04/09/24 2219  TSH 2.401   Anemia Panel: No results for input(s):  "VITAMINB12", "FOLATE", "FERRITIN", "TIBC", "IRON ", "RETICCTPCT" in the last 72 hours. Urine analysis:    Component Value Date/Time   COLORURINE YELLOW 04/09/2024 2101   APPEARANCEUR CLOUDY (A) 04/09/2024 2101   LABSPEC 1.013 04/09/2024 2101  PHURINE 5.0 04/09/2024 2101   GLUCOSEU >=500 (A) 04/09/2024 2101   GLUCOSEU NEG mg/dL 40/98/1191 4782   HGBUR NEGATIVE 04/09/2024 2101   HGBUR negative 10/28/2007 1023   BILIRUBINUR NEGATIVE 04/09/2024 2101   KETONESUR NEGATIVE 04/09/2024 2101   PROTEINUR 30 (A) 04/09/2024 2101   UROBILINOGEN 0.2 08/26/2015 2120   NITRITE NEGATIVE 04/09/2024 2101   LEUKOCYTESUR NEGATIVE 04/09/2024 2101    Radiological Exams on Admission: I have personally reviewed images US  RENAL Result Date: 04/10/2024 CLINICAL DATA:  Acute kidney injury EXAM: RENAL / URINARY TRACT ULTRASOUND COMPLETE COMPARISON:  None Available. FINDINGS: Right Kidney: Renal measurements: 9.9 x 4.7 x 4.8 cm = volume: 116 mL. Echogenicity within normal limits. No mass or hydronephrosis visualized. 16 mm simple cortical cyst noted within the upper pole for which no follow-up imaging is recommended. Left Kidney: Renal measurements: 10.0 x 5.7 x 3.6 cm = volume: 108 mL. Echogenicity within normal limits. No mass or hydronephrosis visualized. 16 mm simple cortical cyst is seen arising exophytically from the upper pole of the left kidney for which no follow-up imaging is recommended. Bladder: Decompressed and not well visualized. Other: None. IMPRESSION: 1. Normal renal sonogram. Electronically Signed   By: Worthy Heads M.D.   On: 04/10/2024 01:52   CT HEAD WO CONTRAST ( ) Result Date: 04/09/2024 CLINICAL DATA:  Head trauma, minor (Age >= 65y) Dizziness, fall. EXAM: CT HEAD WITHOUT CONTRAST TECHNIQUE: Contiguous axial images were obtained from the base of the skull through the vertex without intravenous contrast. RADIATION DOSE REDUCTION: This exam was performed according to the departmental  dose-optimization program which includes automated exposure control, adjustment of the mA and/or kV according to patient size and/or use of iterative reconstruction technique. COMPARISON:  05/05/2019 FINDINGS: Brain: No intracranial hemorrhage, mass effect, or midline shift. No hydrocephalus. The basilar cisterns are patent. No evidence of territorial infarct or acute ischemia. No extra-axial or intracranial fluid collection. Vascular: Atherosclerosis of skullbase vasculature without hyperdense vessel or abnormal calcification. Skull: No fracture or focal lesion. Sinuses/Orbits: No acute findings. Chronic mucosal thickening of left side of sphenoid sinus. Other: No large scalp hematoma. IMPRESSION: No acute intracranial abnormality. No skull fracture. Electronically Signed   By: Chadwick Colonel M.D.   On: 04/09/2024 22:31   CT ABDOMEN PELVIS WO CONTRAST Result Date: 04/09/2024 CLINICAL DATA:  Epigastric pain.  Abdominal pain and diarrhea. EXAM: CT ABDOMEN AND PELVIS WITHOUT CONTRAST TECHNIQUE: Multidetector CT imaging of the abdomen and pelvis was performed following the standard protocol without IV contrast. RADIATION DOSE REDUCTION: This exam was performed according to the departmental dose-optimization program which includes automated exposure control, adjustment of the mA and/or kV according to patient size and/or use of iterative reconstruction technique. COMPARISON:  Renal ultrasound 02/24/2024. Upper GI 08/14/2019. CT abdomen and pelvis 04/10/2011 FINDINGS: Lower chest: Slight fibrosis in the lung bases. Hepatobiliary: No focal liver lesions. Gallbladder is contracted. Heterogeneous appearance of the gallbladder may represent non radiopaque stones or sludge. No inflammatory stranding. No bile duct dilatation. Pancreas: Unremarkable. No pancreatic ductal dilatation or surrounding inflammatory changes. Spleen: Normal in size without focal abnormality. Adrenals/Urinary Tract: Adrenal glands are unremarkable.  Kidneys are normal, without renal calculi, focal lesion, or hydronephrosis. Bladder is unremarkable. Stomach/Bowel: Stomach is within normal limits. Appendix appears normal. No evidence of bowel wall thickening, distention, or inflammatory changes. Vascular/Lymphatic: No significant vascular findings are present. No enlarged abdominal or pelvic lymph nodes. Reproductive: Status post hysterectomy. No adnexal masses. Other: No abdominal wall hernia or abnormality. No abdominopelvic  ascites. Musculoskeletal: No acute or significant osseous findings. IMPRESSION: 1. Possible cholelithiasis or gallbladder sludge. No inflammatory stranding around the gallbladder. 2. No evidence of bowel obstruction or inflammation. 3. Minimal aortic atherosclerosis. Electronically Signed   By: Boyce Byes M.D.   On: 04/09/2024 22:29     EKG: My personal interpretation of EKG shows: EKG showed normal sinus rhythm abnormal R progression heart rate 79.    Assessment/Plan: Principal Problem:   Acute kidney injury superimposed on chronic kidney disease (HCC) Active Problems:   Syncope, vasovagal   Diarrhea   Hypothyroidism   GERD (gastroesophageal reflux disease)   Essential hypertension   Insulin  dependent type 2 diabetes mellitus (HCC)   Cholelithiasis   History of CAD (coronary artery disease)   CKD (chronic kidney disease) stage 3, GFR 30-59 ml/min (HCC)    Assessment and Plan: Acute kidney injury superimposed CKD stage IIIb -Patient presented emergency department complaining of diarrhea for last several days and associated dizziness and had had a mechanical fall due to dizziness. - At presentation to ED patient found hypotensive.  Otherwise hemodynamically stable - UA no evidence of UTI.  CBC unremarkable stable H&H, normal WBC and platelet count.  CMP showing low sodium 134, elevated blood glucose 178, elevated BUN 85, elevated creatinine 5.04, and GFR 9.  Per chart review previous CMP 3 years ago which  showed patient has CKD stage IIIb at baseline and creatinine around 1.5.  It is unclear if patient has been developed progression of the CKD versus development of acute kidney injury in the setting of diarrhea and dehydration. - Prerenal acute kidney injury in the setting of diarrhea and dehydration. -CT abdomen pelvis showing possible cholelithiasis/gallbladder sludge.  No inflammatory stranding and gallbladder.  No evidence of bowel obstruction. --Checking urine sodium, creatinine, creatinine/microalbumin ratio.  Obtaining  renal ultrasound. - In the ED patient has been given 1 L of LR bolus and currently on LR 125 cc/h. - Continue to monitor urine output, and monitor improvement of renal function. - If renal function does not improve in next 1 to 2 days need to consult nephrology for further evaluation.  Vasovagal syncope-in the setting of dehydration and diarrhea -CT head no acute intracranial abnormality. - Vasovagal syncope in the setting of hypotension in the context of diarrhea and dehydration. -Checking orthostatic vital.   Diarrhea -Patient reported 2 to 3 piece of loose stools over the course of last several days.  Patient denies any any nausea, vomiting and abdominal pain.   -Checking GI panel and C. difficile panel.  Cholelithiasis -CT abdomen pelvis showing incidental finding of cholelithiasis/gallbladder sludge.  Given patient is afebrile, negative Murphy sign on physical exam and CBC does not show any evidence of leukocytosis there is no concern for acute cholecystitis at this time. - On discharge need to refer to general surgery for elective cholecystectomy in the future.  History of hypothyroidism -Currently not on any medication at home.  Essential hypertension -Reported not taking any blood pressure regimen for last 3 years.  Insulin -dependent DM type II -Reported not taking insulin  regimen last 2 to 3 years. -Checking A1c level. That she is starting heart healthy  carb modified diet with sliding scale insulin  coverage.  History of CAD -Reported not taking any blood pressure regimen.  Restarting aspirin  and Lipitor.  - Per chart review patient used to be on multiple medications and insulin  however the patient reported for last 3 years she has not seen any primary care doctor and not taking any  medications currently at home except long-acting insulin  20 units daily except long-acting insulin  20 units daily.    DVT prophylaxis:  SQ Heparin  Code Status:  Full Code Diet: Heart healthy carb modified diet Family Communication:   No family member at bedside down Disposition Plan: Monitor improvement of renal function.  Pending GI and C. difficile panel study. Consults: None at this time Admission status:   Inpatient, Telemetry bed  Severity of Illness: The appropriate patient status for this patient is INPATIENT. Inpatient status is judged to be reasonable and necessary in order to provide the required intensity of service to ensure the patient's safety. The patient's presenting symptoms, physical exam findings, and initial radiographic and laboratory data in the context of their chronic comorbidities is felt to place them at high risk for further clinical deterioration. Furthermore, it is not anticipated that the patient will be medically stable for discharge from the hospital within 2 midnights of admission.   * I certify that at the point of admission it is my clinical judgment that the patient will require inpatient hospital care spanning beyond 2 midnights from the point of admission due to high intensity of service, high risk for further deterioration and high frequency of surveillance required.Aaron Aas    Taris Galindo, MD [facility]  How to contact the [facility] Attending or Consulting provider 7A - 7P or covering provider during after hours 7P -7A, for this patient.  Check the care team in [facility] and look for a) attending/consulting [facility] provider listed  and b) the [facility] team listed Log into [email or web address] and use [facility]'s universal password to access. If you do not have the password, please contact the hospital operator. Locate the [facility] provider you are looking for under [facility] and page to a number that you can be directly reached. If you still have difficulty reaching the provider, please page the [facility] (Director on Call) for the Hospitalists listed on amion for assistance.  04/10/2024, 2:36 AM

## 2024-04-10 NOTE — ED Notes (Signed)
 Pt noted to be borderline hypoglycemic. She  had no complaints and was a/o x4 and warm an dry. She was given orange juice and some graham crackers and peanut butter.

## 2024-04-10 NOTE — Inpatient Diabetes Management (Signed)
 Inpatient Diabetes Program Recommendations  AACE/ADA: New Consensus Statement on Inpatient Glycemic Control (2015)  Target Ranges:  Prepandial:   less than 140 mg/dL      Peak postprandial:   less than 180 mg/dL (1-2 hours)      Critically ill patients:  140 - 180 mg/dL   Lab Results  Component Value Date   GLUCAP 179 (H) 04/10/2024   HGBA1C 7.6 (H) 04/10/2024    Review of Glycemic Control  Diabetes history: DM 2 Outpatient Diabetes medications: Ozempic 2 mg QSaturday, Metformin  1000 mg Daily, Glipizide 10 mg Daily, Lantus 20 units Daily Current orders for Inpatient glycemic control:  Novolog  0-6 units tid + hs  A1c 7.6% on 6/6  Spoke with pt at bedside. Pt reports compliance with medications and good follow up with her doctor every 2 months. Pt reports inquiring about CGM in the past but insurance would not cover. This year insurance is covering CGM with $0 copay. Pt would benefit from CGM. Pt has an older android phone and will need the reader as well. Will set pt up with Freestyle Libre 3 plus Sensor which also has safety alarms for hypoglycemia.  Thanks,  Eloise Hake RN, MSN, BC-ADM Inpatient Diabetes Coordinator Team Pager [phone] (8a-5p)

## 2024-04-10 NOTE — Inpatient Diabetes Management (Signed)
 Inpatient Diabetes Program Recommendations  AACE/ADA: New Consensus Statement on Inpatient Glycemic Control (2015)  Target Ranges:  Prepandial:   less than 140 mg/dL      Peak postprandial:   less than 180 mg/dL (1-2 hours)      Critically ill patients:  140 - 180 mg/dL   Lab Results  Component Value Date   GLUCAP 179 (H) 04/10/2024   HGBA1C 7.6 (H) 04/10/2024      Discharge Recommendations: Other recommendations: Freestyle Libre 3 plus sensor order # [id number], Hubert Madden 3 Reader order # [id number]   Use Adult Diabetes Insulin  Treatment Post Discharge order set.   Eloise Hake RN, MSN, BC-ADM Inpatient Diabetes Coordinator Team Pager [phone] (8a-5p)

## 2024-04-10 NOTE — Telephone Encounter (Signed)
 Patient Product/process development scientist completed.    The patient is insured through [facility]. Patient has Medicare and is not eligible for a copay card, but may be able to apply for patient assistance or Medicare RX Payment Plan (Patient Must reach out to their plan, if eligible for payment plan), if available.    Ran test claim for Dexcom G7 Sensor and the current 30 day co-pay is $0.00.  Ran test claim for Freestyle Libre 3 Plus Sensor and the current 30 day co-pay is $0.00.  This test claim was processed through [facility]- copay amounts may vary at other pharmacies due to pharmacy/plan contracts, or as the patient moves through the different stages of their insurance plan.     Morgan Arab, CPHT Pharmacy Technician III Certified Patient Advocate [facility] Patient Advocate Team Direct Number: [phone]  Fax: [phone]

## 2024-04-10 NOTE — Plan of Care (Signed)
  Problem: Metabolic: Goal: Ability to maintain appropriate glucose levels will improve Outcome: Progressing   Problem: Nutritional: Goal: Maintenance of adequate nutrition will improve Outcome: Progressing

## 2024-04-10 NOTE — Progress Notes (Signed)
 Subjective: Patient admitted this morning, see detailed H&P by Dr Chelsea Lester 68 y.o. female with medical history significant of history of CAD with abnormal stress test in 08/2022, CKD stage IIIb, insulin -dependent DM type II, hypothyroidism, GERD, essential hypertension and hyperlipidemia presented to the emergency department complaining about diarrhea, generalized abdominal pain and dizziness.  Patient reported persistent diarrhea for last several days.  CT abdomen pelvis showing possible cholelithiasis or gallbladder/sludge.  No inflammatory stranding around gallbladder.  No evidence of small bowel obstruction or inflammation.  Vitals:   04/10/24 0400 04/10/24 0717  BP: 137/89 105/76  Pulse: 90 70  Resp: 15 13  Temp: 98.6 F (37 C) 98.2 F (36.8 C)  SpO2: 99% 100%      A/P Acute kidney injury superimposed CKD stage IIIb -Patient presented emergency department complaining of diarrhea for last several days and associated dizziness and had had a mechanical fall due to dizziness. - At presentation to ED patient found hypotensive.  Otherwise hemodynamically stable - UA no evidence of UTI.  CBC unremarkable stable H&H, normal WBC and platelet count.  CMP showing low sodium 134, elevated blood glucose 178, elevated BUN 85, elevated creatinine 5.04, and GFR 9.  Per chart review previous CMP 3 years ago which showed patient has CKD stage IIIb at baseline and creatinine around 1.5.  It is unclear if patient has been developed progression of the CKD versus development of acute kidney injury in the setting of diarrhea and dehydration. - Prerenal acute kidney injury in the setting of diarrhea and dehydration. -CT abdomen pelvis showing possible cholelithiasis/gallbladder sludge.  No inflammatory stranding and gallbladder.  No evidence of bowel obstruction. -- urine sodium 67, creatinine 66, creatinine/microalbumin ratio.  Normal renal ultrasound. - In the ED patient has been given 1 L of LR bolus and  currently on LR 125 cc/h. - Continue to monitor urine output, and monitor improvement of renal function. - If renal function does not improve in next 1 to 2 days need to consult nephrology for further evaluation.   Vasovagal syncope-in the setting of dehydration and diarrhea -CT head no acute intracranial abnormality. - Vasovagal syncope in the setting of hypotension in the context of diarrhea and dehydration. -Checking orthostatic vital.     Diarrhea -Patient reported 2 to 3 piece of loose stools over the course of last several days.  Patient denies any any nausea, vomiting and abdominal pain.   -Checking GI panel and C. difficile panel.   Cholelithiasis -CT abdomen pelvis showing incidental finding of cholelithiasis/gallbladder sludge.  Given patient is afebrile, negative Murphy sign on physical exam and CBC does not show any evidence of leukocytosis there is no concern for acute cholecystitis at this time. - On discharge need to refer to general surgery for elective cholecystectomy in the future.   History of hypothyroidism -Currently not on any medication at home.   Essential hypertension -Reported not taking any blood pressure regimen for last 3 years.   Insulin -dependent DM type II -Reported not taking insulin  regimen last 2 to 3 years. -Checking A1c level. That she is starting heart healthy carb modified diet with sliding scale insulin  coverage.   History of CAD -Reported not taking any blood pressure regimen.  Restarting aspirin  and Lipitor.      Ozell Blunt [facility]

## 2024-04-11 DIAGNOSIS — R55 Syncope and collapse: Secondary | ICD-10-CM | POA: Diagnosis not present

## 2024-04-11 DIAGNOSIS — K219 Gastro-esophageal reflux disease without esophagitis: Secondary | ICD-10-CM

## 2024-04-11 DIAGNOSIS — A020 Salmonella enteritis: Principal | ICD-10-CM

## 2024-04-11 DIAGNOSIS — A09 Infectious gastroenteritis and colitis, unspecified: Secondary | ICD-10-CM

## 2024-04-11 DIAGNOSIS — E039 Hypothyroidism, unspecified: Secondary | ICD-10-CM

## 2024-04-11 DIAGNOSIS — N179 Acute kidney failure, unspecified: Secondary | ICD-10-CM | POA: Diagnosis not present

## 2024-04-11 LAB — GASTROINTESTINAL PANEL BY PCR, STOOL (REPLACES STOOL CULTURE)

## 2024-04-11 LAB — COMPREHENSIVE METABOLIC PANEL WITH GFR
ALT: 9 U/L (ref 0–44)
AST: 8 U/L — ABNORMAL LOW (ref 15–41)
Albumin: 4 g/dL (ref 3.5–5.0)
Alkaline Phosphatase: 29 U/L — ABNORMAL LOW (ref 38–126)
Anion gap: 10 (ref 5–15)
BUN: 65 mg/dL — ABNORMAL HIGH (ref 8–23)
CO2: 29 mmol/L (ref 22–32)
Calcium: 9.8 mg/dL (ref 8.9–10.3)
Chloride: 97 mmol/L — ABNORMAL LOW (ref 98–111)
Creatinine, Ser: 3.69 mg/dL — ABNORMAL HIGH (ref 0.44–1.00)
GFR, Estimated: 13 mL/min — ABNORMAL LOW (ref >60)
Glucose, Bld: 97 mg/dL (ref 70–99)
Potassium: 4.8 mmol/L (ref 3.5–5.1)
Sodium: 136 mmol/L (ref 135–145)
Total Bilirubin: 0.4 mg/dL (ref 0.0–1.2)
Total Protein: 7.1 g/dL (ref 6.5–8.1)

## 2024-04-11 LAB — CBC
HCT: 31.4 % — ABNORMAL LOW (ref 36.0–46.0)
Hemoglobin: 9.8 g/dL — ABNORMAL LOW (ref 12.0–15.0)
MCH: 24.4 pg — ABNORMAL LOW (ref 26.0–34.0)
MCHC: 31.2 g/dL (ref 30.0–36.0)
MCV: 78.1 fL — ABNORMAL LOW (ref 80.0–100.0)
Platelets: 298 10*3/uL (ref 150–400)
RBC: 4.02 MIL/uL (ref 3.87–5.11)
RDW: 15.9 % — ABNORMAL HIGH (ref 11.5–15.5)
WBC: 7.8 10*3/uL (ref 4.0–10.5)
nRBC: 0 % (ref 0.0–0.2)

## 2024-04-11 LAB — GLUCOSE, CAPILLARY
Glucose-Capillary: 101 mg/dL — ABNORMAL HIGH (ref 70–99)
Glucose-Capillary: 153 mg/dL — ABNORMAL HIGH (ref 70–99)
Glucose-Capillary: 156 mg/dL — ABNORMAL HIGH (ref 70–99)
Glucose-Capillary: 205 mg/dL — ABNORMAL HIGH (ref 70–99)

## 2024-04-11 MED ORDER — LOPERAMIDE HCL 2 MG PO CAPS
2.0000 mg | ORAL_CAPSULE | Freq: Four times a day (QID) | ORAL | Status: DC | PRN
  Administered 2024-04-11: 2 mg via ORAL
  Filled 2024-04-11: qty 1

## 2024-04-11 MED ORDER — CIPROFLOXACIN HCL 500 MG PO TABS
500.0000 mg | ORAL_TABLET | Freq: Every day | ORAL | Status: DC
  Administered 2024-04-11 – 2024-04-13 (×3): 500 mg via ORAL
  Filled 2024-04-11 (×3): qty 1

## 2024-04-11 MED ORDER — LACTATED RINGERS IV SOLN: INTRAVENOUS | Status: AC

## 2024-04-11 NOTE — Progress Notes (Signed)
 OT Cancellation Note  Patient Details Name: Chelsea Lester MRN: [id number] DOB: 1955/11/25   Cancelled Treatment:    Reason Eval/Treat Not Completed: OT screened, no needs identified, will sign off Patient reporting that she is able to complete ADLs herself at this time with no OT needs. OT to sign off. Thank you for this referral.  Wynette Heckler, MS Acute Rehabilitation Department Office# [phone]  04/11/2024, 2:46 PM

## 2024-04-11 NOTE — Evaluation (Signed)
 Physical Therapy One Time Evaluation Patient Details Name: Chelsea Lester MRN: [id number] DOB: 08-24-1956 Today's Date: 04/11/2024  History of Present Illness  68 y.o. female presented to the emergency department complaining about diarrhea, generalized abdominal pain and dizziness and admitted for Acute kidney injury superimposed CKD stage IIIb and Vasovagal syncope.  Past medical history significant of CAD with abnormal stress test in 08/2022, CKD stage IIIb, insulin -dependent DM type II, hypothyroidism, GERD, essential hypertension and hyperlipidemia presented to the emergency department complaining about diarrhea, generalized abdominal pain and dizziness.  Clinical Impression  Patient evaluated by Physical Therapy with no further acute PT needs identified. All education has been completed and the patient has no further questions. Pt reports she does not have issues with mobilizing.  Pt typically uses cane but utilized RW today for safety/comfort.  Pt has DME at home.  Pt denies any dizziness or symptoms during activity.  No further follow-up Physical Therapy or equipment needs.  Recommend pt continue ambulating with staff. PT is signing off. Thank you for this referral.    04/11/24 1052  Vital Signs  Pulse Rate 79  BP 112/72  BP Location Right Arm  BP Method Automatic  Patient Position (if appropriate) Sitting    04/11/24 1057  Vital Signs  Pulse Rate 84  BP 109/78  BP Location Right Arm  BP Method Automatic  Patient Position (if appropriate) Sitting (after ambulating)        If plan is discharge home, recommend the following:     Can travel by private vehicle        Equipment Recommendations None recommended by PT  Recommendations for Other Services       Functional Status Assessment Patient has had a recent decline in their functional status and demonstrates the ability to make significant improvements in function in a reasonable and predictable amount of time.      Precautions / Restrictions Precautions Precautions: Fall      Mobility  Bed Mobility               General bed mobility comments: pt in recliner    Transfers Overall transfer level: Needs assistance Equipment used: Rolling walker (2 wheels) Transfers: Sit to/from Stand Sit to Stand: Supervision                Ambulation/Gait Ambulation/Gait assistance: Supervision, Contact guard assist Gait Distance (Feet): 80 Feet Assistive device: Rolling walker (2 wheels) Gait Pattern/deviations: Step-through pattern, Decreased stride length       General Gait Details: slow but steady with RW, denies symptoms  Stairs            Wheelchair Mobility     Tilt Bed    Modified Rankin (Stroke Patients Only)       Balance Overall balance assessment: No apparent balance deficits (not formally assessed) (denies mechanical falls, fall prior to admission syncopal event)                                           Pertinent Vitals/Pain Pain Assessment Pain Assessment: No/denies pain    Home Living Family/patient expects to be discharged to:: Private residence Living Arrangements: Spouse/significant other   Type of Home: House Home Access: Ramped entrance       Home Layout: One level Home Equipment: Agricultural consultant (2 wheels);Cane - single point;Wheelchair - manual      Prior  Function Prior Level of Function : Independent/Modified Independent             Mobility Comments: mostly uses cane       Extremity/Trunk Assessment        Lower Extremity Assessment Lower Extremity Assessment: Generalized weakness       Communication   Communication Factors Affecting Communication: Hearing impaired    Cognition Arousal: Alert Behavior During Therapy: WFL for tasks assessed/performed, Flat affect   PT - Cognitive impairments: No apparent impairments                         Following commands: Intact       Cueing        General Comments      Exercises     Assessment/Plan    PT Assessment Patient does not need any further PT services  PT Problem List         PT Treatment Interventions      PT Goals (Current goals can be found in the Care Plan section)  Acute Rehab PT Goals PT Goal Formulation: All assessment and education complete, DC therapy    Frequency       Co-evaluation               AM-PAC PT "6 Clicks" Mobility  Outcome Measure Help needed turning from your back to your side while in a flat bed without using bedrails?: None Help needed moving from lying on your back to sitting on the side of a flat bed without using bedrails?: None Help needed moving to and from a bed to a chair (including a wheelchair)?: None Help needed standing up from a chair using your arms (e.g., wheelchair or bedside chair)?: None Help needed to walk in hospital room?: None Help needed climbing 3-5 steps with a railing? : None 6 Click Score: 24    End of Session   Activity Tolerance: Patient tolerated treatment well Patient left: in chair;with call bell/phone within reach   PT Visit Diagnosis: Difficulty in walking, not elsewhere classified (R26.2)    Time: 9147-8295 PT Time Calculation (min) (ACUTE ONLY): 15 min   Charges:   PT Evaluation $PT Eval Low Complexity: 1 Low   PT General Charges $$ ACUTE PT VISIT: 1 Visit        Henretta Lodge PT, DPT Physical Therapist Acute Rehabilitation Services Office: [phone]   Kati L Payson 04/11/2024, 12:12 PM

## 2024-04-11 NOTE — Plan of Care (Signed)
  Problem: Fluid Volume: Goal: Ability to maintain a balanced intake and output will improve Outcome: Progressing   Problem: Metabolic: Goal: Ability to maintain appropriate glucose levels will improve Outcome: Progressing   Problem: Nutritional: Goal: Maintenance of adequate nutrition will improve Outcome: Progressing   Problem: Tissue Perfusion: Goal: Adequacy of tissue perfusion will improve Outcome: Progressing

## 2024-04-11 NOTE — Progress Notes (Signed)
 [facility]  PROGRESS NOTE  Chelsea Lester VWU:[id number] DOB: July 16, 1956 DOA: 04/09/2024 PCP: [facility]   Brief HPI:    68 y.o. female with medical history significant of history of CAD with abnormal stress test in 08/2022, CKD stage IIIb, insulin -dependent DM type II, hypothyroidism, GERD, essential hypertension and hyperlipidemia presented to the emergency department complaining about diarrhea, generalized abdominal pain and dizziness.  Patient reported persistent diarrhea for last several days.  CT abdomen pelvis showing possible cholelithiasis or gallbladder/sludge.  No inflammatory stranding around gallbladder.  No evidence of small bowel obstruction or inflammation.    Assessment/Plan:   Acute kidney injury superimposed CKD stage IIIb -Renal function is improving, creatinine 3.69. -Patient presented emergency department complaining of diarrhea for last several days and associated dizziness and had had a mechanical fall due to dizziness. -CT abdomen pelvis showing possible cholelithiasis/gallbladder sludge.  No inflammatory stranding and gallbladder.  No evidence of bowel obstruction. - urine sodium 67, creatinine 66, creatinine/microalbumin ratio.  Normal renal ultrasound.    Vasovagal syncope -in the setting of dehydration and diarrhea -CT head no acute intracranial abnormality. - Vasovagal syncope in the setting of hypotension in the context of diarrhea and dehydration. -Checking orthostatic vital.     Diarrhea -Continues to have diarrhea -Stool for C. difficile PCR negative -GI pathogen panel positive for Salmonella -Will start Cipro 500 mg daily for 3 days   Cholelithiasis -CT abdomen pelvis showing incidental finding of cholelithiasis/gallbladder sludge.  Given patient is afebrile, negative Murphy sign on physical exam and CBC does not show any evidence of leukocytosis there is no concern for acute cholecystitis at this time. - On discharge need  to refer to general surgery for elective cholecystectomy in the future.   History of hypothyroidism -Currently not on any medication at home.   Essential hypertension -Reported not taking any blood pressure regimen for last 3 years.   Insulin -dependent DM type II -Reported not taking insulin  regimen last 2 to 3 years. -Checking A1c level. That she is starting heart healthy carb modified diet with sliding scale insulin  coverage.   History of CAD - Continue aspirin  and Lipitor.    Medications     aspirin  EC  81 mg Oral Daily   feeding supplement  237 mL Oral BID BM   heparin   5,000 Units Subcutaneous Q8H   insulin  aspart  0-5 Units Subcutaneous QHS   insulin  aspart  0-6 Units Subcutaneous TID WC   pravastatin   40 mg Oral QHS   sodium chloride  flush  3 mL Intravenous Q12H     Data Reviewed:   CBG:  Recent Labs  Lab 04/10/24 0813 04/10/24 1205 04/10/24 1639 04/10/24 2145 04/11/24 0744  GLUCAP 65* 179* 125* 214* 101*    SpO2: 100 %    Vitals:   04/10/24 1650 04/10/24 2023 04/11/24 0020 04/11/24 0536  BP:  (!) 104/59 100/71 109/68  Pulse:  75 83 77  Resp:  14 16 14   Temp:  98.1 F (36.7 C) 98.9 F (37.2 C) 98.6 F (37 C)  TempSrc:  Oral Oral Oral  SpO2:  94% 97% 100%  Weight: 93.9 kg     Height: 5\' 7"  (1.702 m)         Data Reviewed:  Basic Metabolic Panel: Recent Labs  Lab 04/09/24 1944 04/10/24 0452 04/11/24 0342  NA 134* 136 136  K 5.1 4.0 4.8  CL 96* 98 97*  CO2 28 29 29   GLUCOSE 178* 83 97  BUN 85*  79* 65*  CREATININE 5.04* 4.71* 3.69*  CALCIUM 9.9 9.7 9.8    CBC: Recent Labs  Lab 04/09/24 1944 04/10/24 0452 04/11/24 0342  WBC 8.3 6.9 7.8  HGB 11.3* 9.8* 9.8*  HCT 37.4 31.6* 31.4*  MCV 79.4* 76.9* 78.1*  PLT 336 270 298    LFT Recent Labs  Lab 04/09/24 1944 04/10/24 0452 04/11/24 0342  AST 10* 9* 8*  ALT 9 8 9   ALKPHOS 31* 28* 29*  BILITOT 0.4 0.4 0.4  PROT 8.5* 6.9 7.1  ALBUMIN 4.7 3.8 4.0      Antibiotics: Anti-infectives (From admission, onward)    None        DVT prophylaxis: Heparin   Code Status: Full code  Family Communication:    CONSULTS    Subjective   Continues to have diarrhea.  GI pathogen panel came back positive for Salmonella.   Objective    Physical Examination:   General-appears in no acute distress Heart-S1-S2, regular, no murmur auscultated Lungs-clear to auscultation bilaterally, no wheezing or crackles auscultated Abdomen-soft, nontender, no organomegaly Extremities-no edema in the lower extremities Neuro-alert, oriented x3, no focal deficit noted  Status is: Inpatient:             Chelsea Lester   [facility] If 7PM-7AM, please contact night-coverage at [email or web address], Office  [phone]   04/11/2024, 8:28 AM  LOS: 1 day

## 2024-04-12 DIAGNOSIS — A09 Infectious gastroenteritis and colitis, unspecified: Secondary | ICD-10-CM | POA: Diagnosis not present

## 2024-04-12 DIAGNOSIS — R55 Syncope and collapse: Secondary | ICD-10-CM | POA: Diagnosis not present

## 2024-04-12 DIAGNOSIS — N179 Acute kidney failure, unspecified: Secondary | ICD-10-CM | POA: Diagnosis not present

## 2024-04-12 DIAGNOSIS — A020 Salmonella enteritis: Secondary | ICD-10-CM | POA: Diagnosis not present

## 2024-04-12 LAB — GLUCOSE, CAPILLARY
Glucose-Capillary: 123 mg/dL — ABNORMAL HIGH (ref 70–99)
Glucose-Capillary: 212 mg/dL — ABNORMAL HIGH (ref 70–99)
Glucose-Capillary: 189 mg/dL — ABNORMAL HIGH (ref 70–99)
Glucose-Capillary: 180 mg/dL — ABNORMAL HIGH (ref 70–99)
Glucose-Capillary: 152 mg/dL — ABNORMAL HIGH (ref 70–99)

## 2024-04-12 LAB — COMPREHENSIVE METABOLIC PANEL WITH GFR
ALT: 11 U/L (ref 0–44)
AST: 9 U/L — ABNORMAL LOW (ref 15–41)
Albumin: 3.8 g/dL (ref 3.5–5.0)
Alkaline Phosphatase: 28 U/L — ABNORMAL LOW (ref 38–126)
Anion gap: 12 (ref 5–15)
BUN: 41 mg/dL — ABNORMAL HIGH (ref 8–23)
CO2: 26 mmol/L (ref 22–32)
Calcium: 9.4 mg/dL (ref 8.9–10.3)
Chloride: 99 mmol/L (ref 98–111)
Creatinine, Ser: 2.51 mg/dL — ABNORMAL HIGH (ref 0.44–1.00)
GFR, Estimated: 20 mL/min — ABNORMAL LOW (ref >60)
Glucose, Bld: 189 mg/dL — ABNORMAL HIGH (ref 70–99)
Potassium: 4.2 mmol/L (ref 3.5–5.1)
Sodium: 137 mmol/L (ref 135–145)
Total Bilirubin: 0.8 mg/dL (ref 0.0–1.2)
Total Protein: 7.4 g/dL (ref 6.5–8.1)

## 2024-04-12 NOTE — Progress Notes (Signed)
   04/12/24 1002  TOC Brief Assessment  Insurance and Status Reviewed  Patient has primary care physician Yes  Home environment has been reviewed home w/ spouse  Prior level of function: independent  Prior/Current Home Services No current home services  Social Drivers of Health Review SDOH reviewed no interventions necessary  Readmission risk has been reviewed Yes  Transition of care needs transition of care needs identified, TOC will continue to follow

## 2024-04-12 NOTE — Plan of Care (Signed)
   Problem: Coping: Goal: Ability to adjust to condition or change in health will improve Outcome: Progressing   Problem: Fluid Volume: Goal: Ability to maintain a balanced intake and output will improve Outcome: Progressing   Problem: Metabolic: Goal: Ability to maintain appropriate glucose levels will improve Outcome: Progressing   Problem: Nutritional: Goal: Maintenance of adequate nutrition will improve Outcome: Progressing Goal: Progress toward achieving an optimal weight will improve Outcome: Progressing   Problem: Skin Integrity: Goal: Risk for impaired skin integrity will decrease Outcome: Progressing

## 2024-04-12 NOTE — Progress Notes (Signed)
 [facility]  PROGRESS NOTE  Chelsea Lester:[id number] DOB: 1956-08-05 DOA: 04/09/2024 PCP: [facility]   Brief HPI:    68 y.o. female with medical history significant of history of CAD with abnormal stress test in 08/2022, CKD stage IIIb, insulin -dependent DM type II, hypothyroidism, GERD, essential hypertension and hyperlipidemia presented to the emergency department complaining about diarrhea, generalized abdominal pain and dizziness.  Patient reported persistent diarrhea for last several days.  CT abdomen pelvis showing possible cholelithiasis or gallbladder/sludge.  No inflammatory stranding around gallbladder.  No evidence of small bowel obstruction or inflammation.    Assessment/Plan:   Acute kidney injury superimposed CKD stage IIIb -Renal function is improving, creatinine 2.51 -Patient presented emergency department complaining of diarrhea for last several days and associated dizziness and had had a mechanical fall due to dizziness. -CT abdomen pelvis showing possible cholelithiasis/gallbladder sludge.  No inflammatory stranding and gallbladder.  No evidence of bowel obstruction. - urine sodium 67, creatinine 66, creatinine/microalbumin ratio.  Normal renal ultrasound.    Vasovagal syncope -in the setting of dehydration and diarrhea -CT head no acute intracranial abnormality. - Vasovagal syncope in the setting of hypotension in the context of diarrhea and dehydration. -Checking orthostatic vital.     Diarrhea/Salmonella gastroenteritis -Diarrhea improved after starting Cipro. -Stool for C. difficile PCR negative -GI pathogen panel positive for Salmonella - Started on Cipro 500 mg p.o. daily yesterday for 3 days.  Last dose on 04/13/2024.   Cholelithiasis -CT abdomen pelvis showing incidental finding of cholelithiasis/gallbladder sludge.  Given patient is afebrile, negative Murphy sign on physical exam and CBC does not show any evidence of  leukocytosis there is no concern for acute cholecystitis at this time. - On discharge need to refer to general surgery for elective cholecystectomy in the future.   History of hypothyroidism -Currently not on any medication at home.   Essential hypertension -Reported not taking any blood pressure regimen for last 3 years.   Insulin -dependent DM type II -Reported not taking insulin  regimen last 2 to 3 years. - Hemoglobin A1c 7.6 - Continue sliding scale insulin  NovoLog  - CBG well-controlled    History of CAD - Continue aspirin  and Lipitor.    Medications     aspirin  EC  81 mg Oral Daily   ciprofloxacin  500 mg Oral Q breakfast   feeding supplement  237 mL Oral BID BM   heparin   5,000 Units Subcutaneous Q8H   insulin  aspart  0-5 Units Subcutaneous QHS   insulin  aspart  0-6 Units Subcutaneous TID WC   pravastatin   40 mg Oral QHS   sodium chloride  flush  3 mL Intravenous Q12H     Data Reviewed:   CBG:  Recent Labs  Lab 04/11/24 1652 04/11/24 2116 04/12/24 0735 04/12/24 1136 04/12/24 1328  GLUCAP 156* 205* 123* 212* 189*    SpO2: 100 %    Vitals:   04/11/24 1057 04/11/24 1342 04/11/24 2103 04/12/24 0419  BP: 109/78 103/66 122/82 (!) 103/59  Pulse: 84 85 77 77  Resp:  14 20 18   Temp:  98.6 F (37 C) 98.4 F (36.9 C) 97.8 F (36.6 C)  TempSrc:  Oral Oral Oral  SpO2:  100% 100% 100%  Weight:      Height:          Data Reviewed:  Basic Metabolic Panel: Recent Labs  Lab 04/09/24 1944 04/10/24 0452 04/11/24 0342 04/12/24 1303  NA 134* 136 136 137  K 5.1 4.0 4.8 4.2  CL  96* 98 97* 99  CO2 28 29 29 26   GLUCOSE 178* 83 97 189*  BUN 85* 79* 65* 41*  CREATININE 5.04* 4.71* 3.69* 2.51*  CALCIUM 9.9 9.7 9.8 9.4    CBC: Recent Labs  Lab 04/09/24 1944 04/10/24 0452 04/11/24 0342  WBC 8.3 6.9 7.8  HGB 11.3* 9.8* 9.8*  HCT 37.4 31.6* 31.4*  MCV 79.4* 76.9* 78.1*  PLT 336 270 298    LFT Recent Labs  Lab 04/09/24 1944 04/10/24 0452  04/11/24 0342 04/12/24 1303  AST 10* 9* 8* 9*  ALT 9 8 9 11   ALKPHOS 31* 28* 29* 28*  BILITOT 0.4 0.4 0.4 0.8  PROT 8.5* 6.9 7.1 7.4  ALBUMIN 4.7 3.8 4.0 3.8     Antibiotics: Anti-infectives (From admission, onward)    Start     Dose/Rate Route Frequency Ordered Stop   04/11/24 1500  ciprofloxacin (CIPRO) tablet 500 mg        500 mg Oral Daily with breakfast 04/11/24 1407          DVT prophylaxis: Heparin   Code Status: Full code  Family Communication:    CONSULTS    Subjective   Diarrhea has improved.  Renal function is improving.   Objective    Physical Examination:   General-appears in no acute distress Heart-S1-S2, regular, no murmur auscultated Lungs-clear to auscultation bilaterally, no wheezing or crackles auscultated Abdomen-soft, nontender, no organomegaly Extremities-no edema in the lower extremities Neuro-alert, oriented x3, no focal deficit noted  Status is: Inpatient:             Ozell Blunt   [facility] If 7PM-7AM, please contact night-coverage at [email or web address], Office  [phone]   04/12/2024, 3:02 PM  LOS: 2 days

## 2024-04-12 NOTE — Progress Notes (Signed)
 Mobility Specialist - Progress Note   04/12/24 1531  Mobility  Activity Transferred to/from [facility]  Level of Assistance Modified independent, requires aide device or extra time  Assistive Device Front wheel walker  Distance Ambulated (ft) 200 ft  Range of Motion/Exercises Active  Activity Response Tolerated well  Mobility Referral Yes  Mobility visit 1 Mobility  Mobility Specialist Start Time (ACUTE ONLY) 1520  Mobility Specialist Stop Time (ACUTE ONLY) 1531  Mobility Specialist Time Calculation (min) (ACUTE ONLY) 11 min   Pt was found in bed and agreeable to ambulate. No complaints and returned to recliner chair with all needs met. Call bell in reach.  Lorna Rose,  Mobility Specialist Can be reached via Secure Chat

## 2024-04-13 DIAGNOSIS — E039 Hypothyroidism, unspecified: Secondary | ICD-10-CM | POA: Diagnosis not present

## 2024-04-13 DIAGNOSIS — N179 Acute kidney failure, unspecified: Secondary | ICD-10-CM | POA: Diagnosis not present

## 2024-04-13 DIAGNOSIS — R55 Syncope and collapse: Secondary | ICD-10-CM | POA: Diagnosis not present

## 2024-04-13 DIAGNOSIS — A09 Infectious gastroenteritis and colitis, unspecified: Secondary | ICD-10-CM | POA: Diagnosis not present

## 2024-04-13 LAB — COMPREHENSIVE METABOLIC PANEL WITH GFR
ALT: 9 U/L (ref 0–44)
AST: 11 U/L — ABNORMAL LOW (ref 15–41)
Albumin: 3.4 g/dL — ABNORMAL LOW (ref 3.5–5.0)
Alkaline Phosphatase: 26 U/L — ABNORMAL LOW (ref 38–126)
Anion gap: 9 (ref 5–15)
BUN: 39 mg/dL — ABNORMAL HIGH (ref 8–23)
CO2: 24 mmol/L (ref 22–32)
Calcium: 9 mg/dL (ref 8.9–10.3)
Chloride: 102 mmol/L (ref 98–111)
Creatinine, Ser: 2.26 mg/dL — ABNORMAL HIGH (ref 0.44–1.00)
GFR, Estimated: 23 mL/min — ABNORMAL LOW (ref >60)
Glucose, Bld: 205 mg/dL — ABNORMAL HIGH (ref 70–99)
Potassium: 4.1 mmol/L (ref 3.5–5.1)
Sodium: 135 mmol/L (ref 135–145)
Total Bilirubin: 0.6 mg/dL (ref 0.0–1.2)
Total Protein: 6.4 g/dL — ABNORMAL LOW (ref 6.5–8.1)

## 2024-04-13 LAB — CBC
HCT: 28.3 % — ABNORMAL LOW (ref 36.0–46.0)
Hemoglobin: 8.7 g/dL — ABNORMAL LOW (ref 12.0–15.0)
MCH: 24.5 pg — ABNORMAL LOW (ref 26.0–34.0)
MCHC: 30.7 g/dL (ref 30.0–36.0)
MCV: 79.7 fL — ABNORMAL LOW (ref 80.0–100.0)
Platelets: 269 10*3/uL (ref 150–400)
RBC: 3.55 MIL/uL — ABNORMAL LOW (ref 3.87–5.11)
RDW: 16.1 % — ABNORMAL HIGH (ref 11.5–15.5)
WBC: 6.2 10*3/uL (ref 4.0–10.5)
nRBC: 0 % (ref 0.0–0.2)

## 2024-04-13 LAB — GLUCOSE, CAPILLARY
Glucose-Capillary: 104 mg/dL — ABNORMAL HIGH (ref 70–99)
Glucose-Capillary: 270 mg/dL — ABNORMAL HIGH (ref 70–99)

## 2024-04-13 NOTE — Care Management Important Message (Signed)
 Important Message  Patient Details IM Letter given. Name: Chelsea Lester MRN: [id number] Date of Birth: 08/03/1956   Important Message Given:  Yes - Medicare IM     Chelsea Lester 04/13/2024, 3:10 PM

## 2024-04-13 NOTE — Discharge Instructions (Signed)
 You have gallstones seen on the CT abdomen.  You will need outpatient ultrasound, your PCP can order it.  And you will need referral to surgery

## 2024-04-13 NOTE — TOC Transition Note (Signed)
 Transition of Care [facility]) - Discharge Note   Patient Details  Name: Chelsea Lester MRN: [id number] Date of Birth: [birth year]  Transition of Care [facility]) CM/SW Contact:  Gertha Ku, LCSW Phone Number: 04/13/2024, 3:15 PM   Clinical Narrative:     Received message pt is requesting 3in1 and shower chair. Pt would have to private pay for shower chair. Pt agreeable to 3in1 to be delivered to her home. Referral made to [employer] for 3in1 to be delivered to pt's home. TOC sign off.   Final next level of care: Home/Self Care     Patient Goals and CMS Choice            Discharge Placement                    Patient and family notified of of transfer: 04/13/24  Discharge Plan and Services Additional resources added to the After Visit Summary for                                       Social Drivers of Health (SDOH) Interventions SDOH Screenings   Food Insecurity: Food Insecurity Present (04/10/2024)  Housing: Low Risk  (04/10/2024)  Transportation Needs: No Transportation Needs (04/10/2024)  Utilities: Not At Risk (04/10/2024)  Depression (PHQ2-9): Low Risk  (11/29/2020)  Social Connections: Moderately Integrated (04/10/2024)  Tobacco Use: Low Risk  (04/10/2024)     Readmission Risk Interventions    04/12/2024   10:02 AM  Readmission Risk Prevention Plan  Transportation Screening Complete  PCP or Specialist Appt within 3-5 Days Complete  HRI or Home Care Consult Complete  Social Work Consult for Recovery Care Planning/Counseling Complete  Palliative Care Screening Complete  Medication Review Oceanographer) Complete

## 2024-04-13 NOTE — Discharge Summary (Addendum)
 Physician Discharge Summary   Patient: Chelsea Lester MRN: [id number] DOB: 08-27-1956  Admit date:     04/09/2024  Discharge date: 04/13/24  Discharge Physician: Ozell Blunt   PCP: [facility]   Recommendations at discharge:   Follow-up PCP in 1 week Check BMP in 1 week Blood pressure has been soft in the hospital, recommend to hold Tenormin and amlodipine  until blood pressure goes above 140/90 - Check with PCP in 1 week regarding adjustment of BP meds  Discharge Diagnoses: Principal Problem:   Acute kidney injury superimposed on chronic kidney disease (HCC) Active Problems:   Syncope, vasovagal   Diarrhea   Hypothyroidism   GERD (gastroesophageal reflux disease)   Essential hypertension   Insulin  dependent type 2 diabetes mellitus (HCC)   Cholelithiasis   History of CAD (coronary artery disease)   CKD (chronic kidney disease) stage 3, GFR 30-59 ml/min (HCC)  Resolved Problems:   * No resolved hospital problems. [facility] Course: 69 y.o. female with medical history significant of history of CAD with abnormal stress test in 08/2022, CKD stage IIIb, insulin -dependent DM type II, hypothyroidism, GERD, essential hypertension and hyperlipidemia presented to the emergency department complaining about diarrhea, generalized abdominal pain and dizziness.  Patient reported persistent diarrhea for last several days.  CT abdomen pelvis showing possible cholelithiasis or gallbladder/sludge.  No inflammatory stranding around gallbladder.  No evidence of small bowel obstruction or inflammation  Assessment and Plan:  Acute kidney injury superimposed CKD stage IIIb -Renal function is improving, creatinine 2.26; at baseline -Patient's creatinine was 2.5 in April 2025 -Patient presented emergency department complaining of diarrhea for last several days and associated dizziness and had had a mechanical fall due to dizziness. -CT abdomen pelvis showing possible  cholelithiasis/gallbladder sludge.  No inflammatory stranding and gallbladder.  No evidence of bowel obstruction. - urine sodium 67, creatinine 66, creatinine/microalbumin ratio.  Normal renal ultrasound. -Need to follow-up with nephrology as outpatient.  Will send referral to [facility] as outpatient.     Vasovagal syncope -in the setting of dehydration and diarrhea -CT head no acute intracranial abnormality. - Vasovagal syncope in the setting of hypotension in the context of diarrhea and dehydration.  Hypertension - Patient has been on Tenormin 50 mg daily, amlodipine  10 mg daily, torsemide 10 mg daily - Will discontinue torsemide and potassium supplementation.  Will discontinue irbesartan - I advised patient to check blood pressure daily at home and start taking Tenormin and amlodipine  if blood pressure is above 140/90 - Will follow with PCP for further adjustment of blood pressure medications.      Diarrhea/Salmonella gastroenteritis -Diarrhea improved after starting Cipro. -Stool for C. difficile PCR negative -GI pathogen panel positive for Salmonella - Started on Cipro 500 mg p.o. daily yesterday for 3 days.  Last dose on 04/13/2024.   Cholelithiasis -CT abdomen pelvis showing incidental finding of cholelithiasis/gallbladder sludge.  Given patient is afebrile, negative Murphy sign on physical exam and CBC does not show any evidence of leukocytosis there is no concern for acute cholecystitis at this time. - Will need outpatient abdominal ultrasound and referral to general surgery   History of hypothyroidism -Currently not on any medication at home.   Essential hypertension -Reported not taking any blood pressure regimen for last 3 years.   Insulin -dependent DM type II -Reported not taking insulin  regimen last 2 to 3 years. - Hemoglobin A1c 7.6 - Will discontinue metformin  due to CKD stage IIIb -Continue home regimen  History of CAD - Continue aspirin   and Lipitor.            Consultants:  Procedures performed:  Disposition: Home Diet recommendation:  Discharge Diet Orders (From admission, onward)     Start     Ordered   04/13/24 0000  Diet - low sodium heart healthy        04/13/24 1358           Regular diet DISCHARGE MEDICATION: Allergies as of 04/13/2024       Reactions   Suboxone [buprenorphine Hcl-naloxone Hcl] Itching, Rash, Other (See Comments)   Itchy rash--difficult history   Ace Inhibitors Cough   Latex Rash   Percocet [oxycodone-acetaminophen ] Itching            Medication List     STOP taking these medications    irbesartan 150 MG tablet Commonly known as: AVAPRO   metFORMIN  1000 MG tablet Commonly known as: GLUCOPHAGE    potassium chloride  SA 20 MEQ tablet Commonly known as: KLOR-CON  M   torsemide 10 MG tablet Commonly known as: DEMADEX       TAKE these medications    albuterol  108 (90 Base) MCG/ACT inhaler Commonly known as: VENTOLIN  HFA Inhale 1-2 puffs into the lungs every 4 (four) hours as needed for wheezing or shortness of breath.   allopurinol  300 MG tablet Commonly known as: ZYLOPRIM  Take 300 mg by mouth every morning.   amLODipine  10 MG tablet Commonly known as: NORVASC  Take 1 tablet (10 mg total) by mouth daily. What changed: when to take this   aspirin  81 MG tablet Take 81 mg by mouth every morning.   atenolol 50 MG tablet Commonly known as: TENORMIN Take 50 mg by mouth every morning.   cycloSPORINE  0.05 % ophthalmic emulsion Commonly known as: RESTASIS  Place 1 drop into both eyes as needed.   DULoxetine 30 MG capsule Commonly known as: CYMBALTA Take 30 mg by mouth in the morning.   famotidine  40 MG tablet Commonly known as: PEPCID  Take 40 mg by mouth 2 (two) times daily.   Farxiga 10 MG Tabs tablet Generic drug: dapagliflozin propanediol Take 10 mg by mouth every morning.   fenofibrate  micronized 200 MG capsule Commonly known as: LOFIBRA Take  200 mg by mouth at bedtime.   ferrous sulfate  325 (65 FE) MG tablet Take 1 tablet (325 mg total) by mouth daily with breakfast.   fluticasone -salmeterol 115-21 MCG/ACT inhaler Commonly known as: ADVAIR HFA Inhale 2 puffs into the lungs 2 (two) times daily. What changed:  when to take this reasons to take this   glipiZIDE 10 MG tablet Commonly known as: GLUCOTROL Take 10 mg by mouth every morning.   Lancets Misc Check sugars twice daily before meals   Lantus SoloStar 100 UNIT/ML Solostar Pen Generic drug: insulin  glargine Inject 20 Units into the skin in the morning.   Linzess 290 MCG Caps capsule Generic drug: linaclotide Take 290 mcg by mouth in the morning.   montelukast 10 MG tablet Commonly known as: SINGULAIR Take 10 mg by mouth every evening.   Ozempic (2 MG/DOSE) 8 MG/3ML Sopn Generic drug: Semaglutide (2 MG/DOSE) Inject 2 mg into the skin every 7 (seven) days. On Saturdays   pantoprazole  40 MG tablet Commonly known as: PROTONIX  Take 40 mg by mouth every morning.   pravastatin  40 MG tablet Commonly known as: PRAVACHOL  Take 40 mg by mouth every evening.   QUEtiapine 25 MG tablet Commonly known as: SEROQUEL Take 25 mg by  mouth at bedtime.   sucralfate 1 g tablet Commonly known as: CARAFATE Take 1 g by mouth 2 (two) times daily.   VITAMIN B-12 PO Take 1 tablet by mouth in the morning.   Vitamin D3 50 MCG (2000 UT) Tabs Take 1 tablet by mouth in the morning.        Follow-up Information     [facility] [employer]. Schedule an appointment as soon as possible for a visit.   Contact information: [address] [place] [address] [phone]         [facility] Follow up in 1 week(s).   Why: Check BMP in 1 week Contact information: [address] [place] [address] [phone]                Discharge Exam: Cleavon Curls Weights   04/10/24 1650  Weight: 93.9 kg   General-appears in no acute  distress Heart-S1-S2, regular, no murmur auscultated Lungs-clear to auscultation bilaterally, no wheezing or crackles auscultated Abdomen-soft, nontender, no organomegaly Extremities-no edema in the lower extremities Neuro-alert, oriented x3, no focal deficit noted  Condition at discharge: good  The results of significant diagnostics from this hospitalization (including imaging, microbiology, ancillary and laboratory) are listed below for reference.   Imaging Studies: US  RENAL Result Date: 04/10/2024 CLINICAL DATA:  Acute kidney injury EXAM: RENAL / URINARY TRACT ULTRASOUND COMPLETE COMPARISON:  None Available. FINDINGS: Right Kidney: Renal measurements: 9.9 x 4.7 x 4.8 cm = volume: 116 mL. Echogenicity within normal limits. No mass or hydronephrosis visualized. 16 mm simple cortical cyst noted within the upper pole for which no follow-up imaging is recommended. Left Kidney: Renal measurements: 10.0 x 5.7 x 3.6 cm = volume: 108 mL. Echogenicity within normal limits. No mass or hydronephrosis visualized. 16 mm simple cortical cyst is seen arising exophytically from the upper pole of the left kidney for which no follow-up imaging is recommended. Bladder: Decompressed and not well visualized. Other: None. IMPRESSION: 1. Normal renal sonogram. Electronically Signed   By: Worthy Heads M.D.   On: 04/10/2024 01:52   CT HEAD WO CONTRAST ( ) Result Date: 04/09/2024 CLINICAL DATA:  Head trauma, minor (Age >= 65y) Dizziness, fall. EXAM: CT HEAD WITHOUT CONTRAST TECHNIQUE: Contiguous axial images were obtained from the base of the skull through the vertex without intravenous contrast. RADIATION DOSE REDUCTION: This exam was performed according to the departmental dose-optimization program which includes automated exposure control, adjustment of the mA and/or kV according to patient size and/or use of iterative reconstruction technique. COMPARISON:  05/05/2019 FINDINGS: Brain: No intracranial hemorrhage, mass  effect, or midline shift. No hydrocephalus. The basilar cisterns are patent. No evidence of territorial infarct or acute ischemia. No extra-axial or intracranial fluid collection. Vascular: Atherosclerosis of skullbase vasculature without hyperdense vessel or abnormal calcification. Skull: No fracture or focal lesion. Sinuses/Orbits: No acute findings. Chronic mucosal thickening of left side of sphenoid sinus. Other: No large scalp hematoma. IMPRESSION: No acute intracranial abnormality. No skull fracture. Electronically Signed   By: Chadwick Colonel M.D.   On: 04/09/2024 22:31   CT ABDOMEN PELVIS WO CONTRAST Result Date: 04/09/2024 CLINICAL DATA:  Epigastric pain.  Abdominal pain and diarrhea. EXAM: CT ABDOMEN AND PELVIS WITHOUT CONTRAST TECHNIQUE: Multidetector CT imaging of the abdomen and pelvis was performed following the standard protocol without IV contrast. RADIATION DOSE REDUCTION: This exam was performed according to the departmental dose-optimization program which includes automated exposure control, adjustment of the mA and/or kV according to patient size and/or  use of iterative reconstruction technique. COMPARISON:  Renal ultrasound 02/24/2024. Upper GI 08/14/2019. CT abdomen and pelvis 04/10/2011 FINDINGS: Lower chest: Slight fibrosis in the lung bases. Hepatobiliary: No focal liver lesions. Gallbladder is contracted. Heterogeneous appearance of the gallbladder may represent non radiopaque stones or sludge. No inflammatory stranding. No bile duct dilatation. Pancreas: Unremarkable. No pancreatic ductal dilatation or surrounding inflammatory changes. Spleen: Normal in size without focal abnormality. Adrenals/Urinary Tract: Adrenal glands are unremarkable. Kidneys are normal, without renal calculi, focal lesion, or hydronephrosis. Bladder is unremarkable. Stomach/Bowel: Stomach is within normal limits. Appendix appears normal. No evidence of bowel wall thickening, distention, or inflammatory changes.  Vascular/Lymphatic: No significant vascular findings are present. No enlarged abdominal or pelvic lymph nodes. Reproductive: Status post hysterectomy. No adnexal masses. Other: No abdominal wall hernia or abnormality. No abdominopelvic ascites. Musculoskeletal: No acute or significant osseous findings. IMPRESSION: 1. Possible cholelithiasis or gallbladder sludge. No inflammatory stranding around the gallbladder. 2. No evidence of bowel obstruction or inflammation. 3. Minimal aortic atherosclerosis. Electronically Signed   By: Boyce Byes M.D.   On: 04/09/2024 22:29    Microbiology: Results for orders placed or performed during the hospital encounter of 04/09/24  C Difficile Quick Screen w PCR reflex     Status: None   Collection Time: 04/10/24  7:26 PM   Specimen: STOOL  Result Value Ref Range Status   C Diff antigen NEGATIVE NEGATIVE Final   C Diff toxin NEGATIVE NEGATIVE Final   C Diff interpretation No C. difficile detected.  Final    Comment: VALID Performed at [facility], [address] [place], [place] [address]   Gastrointestinal Panel by PCR , Stool     Status: Abnormal   Collection Time: 04/10/24  7:45 PM   Specimen: STOOL  Result Value Ref Range Status   Campylobacter species NOT DETECTED NOT DETECTED Final   Plesimonas shigelloides NOT DETECTED NOT DETECTED Final   Salmonella species DETECTED (A) NOT DETECTED Final    Comment: RESULT CALLED TO, READ BACK BY AND VERIFIED WITH: LISA MCKNIGHT 04/11/24 1350 KLW    Yersinia enterocolitica NOT DETECTED NOT DETECTED Final   Vibrio species NOT DETECTED NOT DETECTED Final   Vibrio cholerae NOT DETECTED NOT DETECTED Final   Enteroaggregative E coli (EAEC) NOT DETECTED NOT DETECTED Final   Enteropathogenic E coli (EPEC) NOT DETECTED NOT DETECTED Final   Enterotoxigenic E coli (ETEC) NOT DETECTED NOT DETECTED Final   Shiga like toxin producing E coli (STEC) NOT DETECTED NOT DETECTED Final    Shigella/Enteroinvasive E coli (EIEC) NOT DETECTED NOT DETECTED Final   Cryptosporidium NOT DETECTED NOT DETECTED Final   Cyclospora cayetanensis NOT DETECTED NOT DETECTED Final   Entamoeba histolytica NOT DETECTED NOT DETECTED Final   Giardia lamblia NOT DETECTED NOT DETECTED Final   Adenovirus F40/41 NOT DETECTED NOT DETECTED Final   Astrovirus NOT DETECTED NOT DETECTED Final   Norovirus GI/GII NOT DETECTED NOT DETECTED Final   Rotavirus A NOT DETECTED NOT DETECTED Final   Sapovirus (I, II, IV, and V) NOT DETECTED NOT DETECTED Final    Comment: Performed at [facility], [address] [place], [place] [address]    Labs: CBC: Recent Labs  Lab 04/09/24 1944 04/10/24 0452 04/11/24 0342 04/13/24 0346  WBC 8.3 6.9 7.8 6.2  HGB 11.3* 9.8* 9.8* 8.7*  HCT 37.4 31.6* 31.4* 28.3*  MCV 79.4* 76.9* 78.1* 79.7*  PLT 336 270 298 269   Basic Metabolic Panel: Recent Labs  Lab 04/09/24  1944 04/10/24 0452 04/11/24 0342 04/12/24 1303 04/13/24 0346  NA 134* 136 136 137 135  K 5.1 4.0 4.8 4.2 4.1  CL 96* 98 97* 99 102  CO2 28 29 29 26 24   GLUCOSE 178* 83 97 189* 205*  BUN 85* 79* 65* 41* 39*  CREATININE 5.04* 4.71* 3.69* 2.51* 2.26*  CALCIUM 9.9 9.7 9.8 9.4 9.0   Liver Function Tests: Recent Labs  Lab 04/09/24 1944 04/10/24 0452 04/11/24 0342 04/12/24 1303 04/13/24 0346  AST 10* 9* 8* 9* 11*  ALT 9 8 9 11 9   ALKPHOS 31* 28* 29* 28* 26*  BILITOT 0.4 0.4 0.4 0.8 0.6  PROT 8.5* 6.9 7.1 7.4 6.4*  ALBUMIN 4.7 3.8 4.0 3.8 3.4*   CBG: Recent Labs  Lab 04/12/24 1328 04/12/24 1701 04/12/24 2130 04/13/24 0748 04/13/24 1121  GLUCAP 189* 180* 152* 104* 270*    Discharge time spent: greater than 30 minutes.  Signed: Ozell Blunt, MD [facility] 04/13/2024

## 2024-04-13 NOTE — Progress Notes (Incomplete)
 [facility]  PROGRESS NOTE  Runette Scifres WRU:[id number] DOB: Jul 14, 1956 DOA: 04/09/2024 PCP: [facility]   Brief HPI:    68 y.o. female with medical history significant of history of CAD with abnormal stress test in 08/2022, CKD stage IIIb, insulin -dependent DM type II, hypothyroidism, GERD, essential hypertension and hyperlipidemia presented to the emergency department complaining about diarrhea, generalized abdominal pain and dizziness.  Patient reported persistent diarrhea for last several days.  CT abdomen pelvis showing possible cholelithiasis or gallbladder/sludge.  No inflammatory stranding around gallbladder.  No evidence of small bowel obstruction or inflammation.    Assessment/Plan:   Acute kidney injury superimposed CKD stage IIIb -Renal function is improving, creatinine 2.51 -Patient presented emergency department complaining of diarrhea for last several days and associated dizziness and had had a mechanical fall due to dizziness. -CT abdomen pelvis showing possible cholelithiasis/gallbladder sludge.  No inflammatory stranding and gallbladder.  No evidence of bowel obstruction. - urine sodium 67, creatinine 66, creatinine/microalbumin ratio.  Normal renal ultrasound.    Vasovagal syncope -in the setting of dehydration and diarrhea -CT head no acute intracranial abnormality. - Vasovagal syncope in the setting of hypotension in the context of diarrhea and dehydration. -Checking orthostatic vital.     Diarrhea/Salmonella gastroenteritis -Diarrhea improved after starting Cipro. -Stool for C. difficile PCR negative -GI pathogen panel positive for Salmonella - Started on Cipro 500 mg p.o. daily yesterday for 3 days.  Last dose on 04/13/2024.   Cholelithiasis -CT abdomen pelvis showing incidental finding of cholelithiasis/gallbladder sludge.  Given patient is afebrile, negative Murphy sign on physical exam and CBC does not show any evidence of  leukocytosis there is no concern for acute cholecystitis at this time. - On discharge need to refer to general surgery for elective cholecystectomy in the future.   History of hypothyroidism -Currently not on any medication at home.   Essential hypertension -Reported not taking any blood pressure regimen for last 3 years.   Insulin -dependent DM type II -Reported not taking insulin  regimen last 2 to 3 years. - Hemoglobin A1c 7.6 - Continue sliding scale insulin  NovoLog  - CBG well-controlled    History of CAD - Continue aspirin  and Lipitor.    Medications     aspirin  EC  81 mg Oral Daily   ciprofloxacin  500 mg Oral Q breakfast   feeding supplement  237 mL Oral BID BM   heparin   5,000 Units Subcutaneous Q8H   insulin  aspart  0-5 Units Subcutaneous QHS   insulin  aspart  0-6 Units Subcutaneous TID WC   pravastatin   40 mg Oral QHS   sodium chloride  flush  3 mL Intravenous Q12H     Data Reviewed:   CBG:  Recent Labs  Lab 04/12/24 1136 04/12/24 1328 04/12/24 1701 04/12/24 2130 04/13/24 0748  GLUCAP 212* 189* 180* 152* 104*    SpO2: 97 %    Vitals:   04/12/24 0419 04/12/24 1555 04/12/24 2100 04/13/24 0552  BP: (!) 103/59 104/68 117/75 117/74  Pulse: 77 87 82 79  Resp: 18 20 20    Temp: 97.8 F (36.6 C) 98.5 F (36.9 C) 98.1 F (36.7 C) 98.7 F (37.1 C)  TempSrc: Oral Oral Oral Oral  SpO2: 100% 100% 100% 97%  Weight:      Height:          Data Reviewed:  Basic Metabolic Panel: Recent Labs  Lab 04/09/24 1944 04/10/24 0452 04/11/24 0342 04/12/24 1303 04/13/24 0346  NA 134* 136 136 137 135  K  5.1 4.0 4.8 4.2 4.1  CL 96* 98 97* 99 102  CO2 28 29 29 26 24   GLUCOSE 178* 83 97 189* 205*  BUN 85* 79* 65* 41* 39*  CREATININE 5.04* 4.71* 3.69* 2.51* 2.26*  CALCIUM 9.9 9.7 9.8 9.4 9.0    CBC: Recent Labs  Lab 04/09/24 1944 04/10/24 0452 04/11/24 0342 04/13/24 0346  WBC 8.3 6.9 7.8 6.2  HGB 11.3* 9.8* 9.8* 8.7*  HCT 37.4 31.6* 31.4* 28.3*   MCV 79.4* 76.9* 78.1* 79.7*  PLT 336 270 298 269    LFT Recent Labs  Lab 04/09/24 1944 04/10/24 0452 04/11/24 0342 04/12/24 1303 04/13/24 0346  AST 10* 9* 8* 9* 11*  ALT 9 8 9 11 9   ALKPHOS 31* 28* 29* 28* 26*  BILITOT 0.4 0.4 0.4 0.8 0.6  PROT 8.5* 6.9 7.1 7.4 6.4*  ALBUMIN 4.7 3.8 4.0 3.8 3.4*     Antibiotics: Anti-infectives (From admission, onward)    Start     Dose/Rate Route Frequency Ordered Stop   04/11/24 1500  ciprofloxacin (CIPRO) tablet 500 mg        500 mg Oral Daily with breakfast 04/11/24 1407          DVT prophylaxis: Heparin   Code Status: Full code  Family Communication:    CONSULTS    Subjective      Objective    Physical Examination:     Status is: Inpatient:             Osborn Pullin S Tangia Pinard   [facility] If 7PM-7AM, please contact night-coverage at [email or web address], Office  [phone]   04/13/2024, 8:01 AM  LOS: 3 days

## 2024-04-13 NOTE — Plan of Care (Signed)

## 2024-04-13 NOTE — Plan of Care (Signed)
 Problem: Education: Goal: Ability to describe self-care measures that may prevent or decrease complications (Diabetes Survival Skills Education) will improve 04/13/2024 1508 by Coletta Davidson, RN Outcome: Adequate for Discharge 04/13/2024 [phone] by Coletta Davidson, RN Outcome: Progressing Goal: Individualized Educational Video(s) 04/13/2024 1508 by Coletta Davidson, RN Outcome: Adequate for Discharge 04/13/2024 [phone] by Coletta Davidson, RN Outcome: Progressing   Problem: Coping: Goal: Ability to adjust to condition or change in health will improve 04/13/2024 1508 by Coletta Davidson, RN Outcome: Adequate for Discharge 04/13/2024 [phone] by Coletta Davidson, RN Outcome: Progressing   Problem: Fluid Volume: Goal: Ability to maintain a balanced intake and output will improve 04/13/2024 1508 by Coletta Davidson, RN Outcome: Adequate for Discharge 04/13/2024 0824 by Coletta Davidson, RN Outcome: Progressing   Problem: Health Behavior/Discharge Planning: Goal: Ability to identify and utilize available resources and services will improve 04/13/2024 1508 by Coletta Davidson, RN Outcome: Adequate for Discharge 04/13/2024 0824 by Coletta Davidson, RN Outcome: Progressing Goal: Ability to manage health-related needs will improve 04/13/2024 1508 by Coletta Davidson, RN Outcome: Adequate for Discharge 04/13/2024 [phone] by Coletta Davidson, RN Outcome: Progressing   Problem: Metabolic: Goal: Ability to maintain appropriate glucose levels will improve 04/13/2024 1508 by Coletta Davidson, RN Outcome: Adequate for Discharge 04/13/2024 [phone] by Coletta Davidson, RN Outcome: Progressing   Problem: Nutritional: Goal: Maintenance of adequate nutrition will improve 04/13/2024 1508 by Coletta Davidson, RN Outcome: Adequate for Discharge 04/13/2024 [phone] by Coletta Davidson, RN Outcome: Progressing Goal: Progress toward achieving an optimal weight will  improve 04/13/2024 1508 by Coletta Davidson, RN Outcome: Adequate for Discharge 04/13/2024 [phone] by Coletta Davidson, RN Outcome: Progressing   Problem: Skin Integrity: Goal: Risk for impaired skin integrity will decrease 04/13/2024 1508 by Coletta Davidson, RN Outcome: Adequate for Discharge 04/13/2024 [phone] by Coletta Davidson, RN Outcome: Progressing   Problem: Tissue Perfusion: Goal: Adequacy of tissue perfusion will improve 04/13/2024 1508 by Coletta Davidson, RN Outcome: Adequate for Discharge 04/13/2024 [phone] by Coletta Davidson, RN Outcome: Progressing   Problem: Education: Goal: Knowledge of General Education information will improve Description: Including pain rating scale, medication(s)/side effects and non-pharmacologic comfort measures 04/13/2024 1508 by Coletta Davidson, RN Outcome: Adequate for Discharge 04/13/2024 [phone] by Coletta Davidson, RN Outcome: Progressing   Problem: Health Behavior/Discharge Planning: Goal: Ability to manage health-related needs will improve 04/13/2024 1508 by Coletta Davidson, RN Outcome: Adequate for Discharge 04/13/2024 [phone] by Coletta Davidson, RN Outcome: Progressing   Problem: Clinical Measurements: Goal: Ability to maintain clinical measurements within normal limits will improve 04/13/2024 1508 by Coletta Davidson, RN Outcome: Adequate for Discharge 04/13/2024 [phone] by Coletta Davidson, RN Outcome: Progressing Goal: Will remain free from infection 04/13/2024 1508 by Coletta Davidson, RN Outcome: Adequate for Discharge 04/13/2024 [phone] by Coletta Davidson, RN Outcome: Progressing Goal: Diagnostic test results will improve 04/13/2024 1508 by Coletta Davidson, RN Outcome: Adequate for Discharge 04/13/2024 [phone] by Coletta Davidson, RN Outcome: Progressing Goal: Respiratory complications will improve 04/13/2024 1508 by Coletta Davidson, RN Outcome: Adequate for Discharge 04/13/2024 [phone] by  Coletta Davidson, RN Outcome: Progressing Goal: Cardiovascular complication will be avoided 04/13/2024 1508 by Coletta Davidson, RN Outcome: Adequate for Discharge 04/13/2024 [phone] by Coletta Davidson, RN Outcome: Progressing   Problem: Activity: Goal: Risk for activity intolerance will decrease 04/13/2024 1508 by Coletta Davidson, RN Outcome: Adequate for Discharge  04/13/2024 0824 by Coletta Davidson, RN Outcome: Progressing   Problem: Nutrition: Goal: Adequate nutrition will be maintained 04/13/2024 1508 by Coletta Davidson, RN Outcome: Adequate for Discharge 04/13/2024 [phone] by Coletta Davidson, RN Outcome: Progressing   Problem: Coping: Goal: Level of anxiety will decrease 04/13/2024 1508 by Coletta Davidson, RN Outcome: Adequate for Discharge 04/13/2024 [phone] by Coletta Davidson, RN Outcome: Progressing   Problem: Elimination: Goal: Will not experience complications related to bowel motility 04/13/2024 1508 by Coletta Davidson, RN Outcome: Adequate for Discharge 04/13/2024 [phone] by Coletta Davidson, RN Outcome: Progressing Goal: Will not experience complications related to urinary retention 04/13/2024 1508 by Coletta Davidson, RN Outcome: Adequate for Discharge 04/13/2024 [phone] by Coletta Davidson, RN Outcome: Progressing   Problem: Pain Managment: Goal: General experience of comfort will improve and/or be controlled 04/13/2024 1508 by Coletta Davidson, RN Outcome: Adequate for Discharge 04/13/2024 [phone] by Coletta Davidson, RN Outcome: Progressing   Problem: Safety: Goal: Ability to remain free from injury will improve 04/13/2024 1508 by Coletta Davidson, RN Outcome: Adequate for Discharge 04/13/2024 0824 by Coletta Davidson, RN Outcome: Progressing   Problem: Skin Integrity: Goal: Risk for impaired skin integrity will decrease 04/13/2024 1508 by Coletta Davidson, RN Outcome: Adequate for Discharge 04/13/2024 [phone] by  Coletta Davidson, RN Outcome: Progressing

## 2024-04-13 NOTE — Progress Notes (Signed)
 Mobility Specialist - Progress Note   04/13/24 1009  Mobility  Activity Ambulated with assistance in hallway  Level of Assistance Modified independent, requires aide device or extra time  Assistive Device Front wheel walker  Distance Ambulated (ft) 200 ft  Range of Motion/Exercises Active  Activity Response Tolerated well  Mobility Referral Yes  Mobility visit 1 Mobility  Mobility Specialist Start Time (ACUTE ONLY) [id number]  Mobility Specialist Stop Time (ACUTE ONLY) [id number]  Mobility Specialist Time Calculation (min) (ACUTE ONLY) 10 min   Pt was found in bed and agreeable to ambulate. No complaints during session and returned to sit EOB with all needs met. Call bell in reach and RN notified.  Lorna Rose,  Mobility Specialist Can be reached via Secure Chat
